# Patient Record
Sex: Female | Born: 1955 | Race: White | Hispanic: No | Marital: Married | Smoking: Never smoker
Health system: Southern US, Community
[De-identification: ages and names within clinical notes are randomized; demographics above are authoritative.]

## PROBLEM LIST (undated history)

## (undated) DIAGNOSIS — R7303 Prediabetes: Secondary | ICD-10-CM

## (undated) DIAGNOSIS — F419 Anxiety disorder, unspecified: Secondary | ICD-10-CM

## (undated) DIAGNOSIS — D649 Anemia, unspecified: Secondary | ICD-10-CM

## (undated) DIAGNOSIS — I10 Essential (primary) hypertension: Secondary | ICD-10-CM

## (undated) DIAGNOSIS — E039 Hypothyroidism, unspecified: Secondary | ICD-10-CM

## (undated) DIAGNOSIS — F32A Depression, unspecified: Secondary | ICD-10-CM

## (undated) DIAGNOSIS — E785 Hyperlipidemia, unspecified: Secondary | ICD-10-CM

## (undated) DIAGNOSIS — N189 Chronic kidney disease, unspecified: Secondary | ICD-10-CM

## (undated) DIAGNOSIS — M1990 Unspecified osteoarthritis, unspecified site: Secondary | ICD-10-CM

## (undated) DIAGNOSIS — J45909 Unspecified asthma, uncomplicated: Secondary | ICD-10-CM

## (undated) DIAGNOSIS — R519 Headache, unspecified: Secondary | ICD-10-CM

## (undated) DIAGNOSIS — K219 Gastro-esophageal reflux disease without esophagitis: Secondary | ICD-10-CM

## (undated) DIAGNOSIS — J342 Deviated nasal septum: Secondary | ICD-10-CM

## (undated) DIAGNOSIS — R51 Headache: Secondary | ICD-10-CM

## (undated) DIAGNOSIS — F329 Major depressive disorder, single episode, unspecified: Secondary | ICD-10-CM

## (undated) DIAGNOSIS — Z8719 Personal history of other diseases of the digestive system: Secondary | ICD-10-CM

## (undated) DIAGNOSIS — G4733 Obstructive sleep apnea (adult) (pediatric): Secondary | ICD-10-CM

## (undated) DIAGNOSIS — E119 Type 2 diabetes mellitus without complications: Secondary | ICD-10-CM

## (undated) DIAGNOSIS — G4730 Sleep apnea, unspecified: Secondary | ICD-10-CM

## (undated) DIAGNOSIS — H269 Unspecified cataract: Secondary | ICD-10-CM

## (undated) HISTORY — DX: Hyperlipidemia, unspecified: E78.5

## (undated) HISTORY — DX: Obstructive sleep apnea (adult) (pediatric): G47.33

## (undated) HISTORY — PX: BREAST SURGERY: SHX581

## (undated) HISTORY — DX: Anemia, unspecified: D64.9

## (undated) HISTORY — DX: Essential (primary) hypertension: I10

## (undated) HISTORY — DX: Deviated nasal septum: J34.2

## (undated) HISTORY — PX: COLONOSCOPY: SHX174

## (undated) HISTORY — DX: Major depressive disorder, single episode, unspecified: F32.9

## (undated) HISTORY — DX: Anxiety disorder, unspecified: F41.9

## (undated) HISTORY — DX: Unspecified asthma, uncomplicated: J45.909

## (undated) HISTORY — DX: Gastro-esophageal reflux disease without esophagitis: K21.9

## (undated) HISTORY — PX: ANKLE GANGLION CYST EXCISION: SHX1148

## (undated) HISTORY — DX: Sleep apnea, unspecified: G47.30

## (undated) HISTORY — PX: UPPER GASTROINTESTINAL ENDOSCOPY: SHX188

## (undated) HISTORY — PX: OTHER SURGICAL HISTORY: SHX169

## (undated) HISTORY — PX: BACK SURGERY: SHX140

## (undated) HISTORY — DX: Depression, unspecified: F32.A

## (undated) HISTORY — DX: Unspecified cataract: H26.9

## (undated) HISTORY — DX: Type 2 diabetes mellitus without complications: E11.9

## (undated) HISTORY — DX: Chronic kidney disease, unspecified: N18.9

---

## 1998-11-27 ENCOUNTER — Encounter: Payer: Self-pay | Admitting: *Deleted

## 1998-11-27 ENCOUNTER — Ambulatory Visit (HOSPITAL_COMMUNITY): Admission: RE | Admit: 1998-11-27 | Discharge: 1998-11-27 | Payer: Self-pay | Admitting: *Deleted

## 1998-12-11 ENCOUNTER — Encounter: Payer: Self-pay | Admitting: *Deleted

## 1998-12-11 ENCOUNTER — Ambulatory Visit (HOSPITAL_COMMUNITY): Admission: RE | Admit: 1998-12-11 | Discharge: 1998-12-11 | Payer: Self-pay | Admitting: *Deleted

## 1998-12-25 ENCOUNTER — Ambulatory Visit (HOSPITAL_COMMUNITY): Admission: RE | Admit: 1998-12-25 | Discharge: 1998-12-25 | Payer: Self-pay | Admitting: *Deleted

## 1998-12-25 ENCOUNTER — Encounter: Payer: Self-pay | Admitting: *Deleted

## 1999-05-25 ENCOUNTER — Ambulatory Visit (HOSPITAL_COMMUNITY): Admission: RE | Admit: 1999-05-25 | Discharge: 1999-05-25 | Payer: Self-pay | Admitting: Gastroenterology

## 2000-01-11 ENCOUNTER — Emergency Department (HOSPITAL_COMMUNITY): Admission: EM | Admit: 2000-01-11 | Discharge: 2000-01-11 | Payer: Self-pay | Admitting: Emergency Medicine

## 2000-08-04 ENCOUNTER — Ambulatory Visit (HOSPITAL_BASED_OUTPATIENT_CLINIC_OR_DEPARTMENT_OTHER): Admission: RE | Admit: 2000-08-04 | Discharge: 2000-08-04 | Payer: Self-pay | Admitting: Plastic Surgery

## 2000-08-04 ENCOUNTER — Encounter (INDEPENDENT_AMBULATORY_CARE_PROVIDER_SITE_OTHER): Payer: Self-pay | Admitting: *Deleted

## 2000-09-11 ENCOUNTER — Ambulatory Visit (HOSPITAL_COMMUNITY): Admission: RE | Admit: 2000-09-11 | Discharge: 2000-09-11 | Payer: Self-pay | Admitting: Internal Medicine

## 2000-09-11 ENCOUNTER — Encounter: Payer: Self-pay | Admitting: Internal Medicine

## 2000-09-17 ENCOUNTER — Ambulatory Visit (HOSPITAL_COMMUNITY): Admission: RE | Admit: 2000-09-17 | Discharge: 2000-09-17 | Payer: Self-pay | Admitting: Urology

## 2000-09-17 ENCOUNTER — Encounter (INDEPENDENT_AMBULATORY_CARE_PROVIDER_SITE_OTHER): Payer: Self-pay

## 2005-07-24 ENCOUNTER — Ambulatory Visit (HOSPITAL_COMMUNITY): Admission: RE | Admit: 2005-07-24 | Discharge: 2005-07-24 | Payer: Self-pay | Admitting: Internal Medicine

## 2005-12-12 ENCOUNTER — Ambulatory Visit (HOSPITAL_COMMUNITY): Admission: RE | Admit: 2005-12-12 | Discharge: 2005-12-12 | Payer: Self-pay | Admitting: Obstetrics and Gynecology

## 2005-12-12 ENCOUNTER — Encounter (INDEPENDENT_AMBULATORY_CARE_PROVIDER_SITE_OTHER): Payer: Self-pay | Admitting: *Deleted

## 2006-03-25 ENCOUNTER — Encounter: Admission: RE | Admit: 2006-03-25 | Discharge: 2006-03-25 | Payer: Self-pay | Admitting: Plastic Surgery

## 2006-04-26 ENCOUNTER — Encounter: Admission: RE | Admit: 2006-04-26 | Discharge: 2006-04-26 | Payer: Self-pay | Admitting: Neurological Surgery

## 2006-05-07 ENCOUNTER — Encounter (INDEPENDENT_AMBULATORY_CARE_PROVIDER_SITE_OTHER): Payer: Self-pay | Admitting: Specialist

## 2006-05-07 ENCOUNTER — Ambulatory Visit (HOSPITAL_BASED_OUTPATIENT_CLINIC_OR_DEPARTMENT_OTHER): Admission: RE | Admit: 2006-05-07 | Discharge: 2006-05-07 | Payer: Self-pay | Admitting: Plastic Surgery

## 2006-05-08 ENCOUNTER — Encounter
Admission: RE | Admit: 2006-05-08 | Discharge: 2006-05-08 | Payer: Self-pay | Admitting: Physical Medicine and Rehabilitation

## 2006-06-13 ENCOUNTER — Ambulatory Visit (HOSPITAL_COMMUNITY): Admission: RE | Admit: 2006-06-13 | Discharge: 2006-06-13 | Payer: Self-pay | Admitting: *Deleted

## 2006-08-26 HISTORY — PX: COLON SURGERY: SHX602

## 2006-08-27 ENCOUNTER — Encounter: Admission: RE | Admit: 2006-08-27 | Discharge: 2006-08-27 | Payer: Self-pay | Admitting: Allergy and Immunology

## 2006-11-20 ENCOUNTER — Encounter: Admission: RE | Admit: 2006-11-20 | Discharge: 2006-11-20 | Payer: Self-pay | Admitting: *Deleted

## 2006-11-25 ENCOUNTER — Other Ambulatory Visit: Admission: RE | Admit: 2006-11-25 | Discharge: 2006-11-25 | Payer: Self-pay | Admitting: Radiology

## 2007-07-14 ENCOUNTER — Inpatient Hospital Stay (HOSPITAL_COMMUNITY): Admission: RE | Admit: 2007-07-14 | Discharge: 2007-07-17 | Payer: Self-pay | Admitting: General Surgery

## 2007-07-14 ENCOUNTER — Encounter (INDEPENDENT_AMBULATORY_CARE_PROVIDER_SITE_OTHER): Payer: Self-pay | Admitting: General Surgery

## 2007-08-06 ENCOUNTER — Encounter (INDEPENDENT_AMBULATORY_CARE_PROVIDER_SITE_OTHER): Payer: Self-pay | Admitting: General Surgery

## 2007-08-06 ENCOUNTER — Ambulatory Visit (HOSPITAL_BASED_OUTPATIENT_CLINIC_OR_DEPARTMENT_OTHER): Admission: RE | Admit: 2007-08-06 | Discharge: 2007-08-06 | Payer: Self-pay | Admitting: General Surgery

## 2008-04-04 ENCOUNTER — Ambulatory Visit (HOSPITAL_COMMUNITY): Admission: RE | Admit: 2008-04-04 | Discharge: 2008-04-04 | Payer: Self-pay | Admitting: Internal Medicine

## 2009-05-23 ENCOUNTER — Encounter: Admission: RE | Admit: 2009-05-23 | Discharge: 2009-05-23 | Payer: Self-pay | Admitting: Gastroenterology

## 2009-05-24 ENCOUNTER — Encounter: Admission: RE | Admit: 2009-05-24 | Discharge: 2009-05-24 | Payer: Self-pay | Admitting: Gastroenterology

## 2010-04-26 ENCOUNTER — Other Ambulatory Visit: Admission: RE | Admit: 2010-04-26 | Discharge: 2010-04-26 | Payer: Self-pay | Admitting: Internal Medicine

## 2010-10-31 ENCOUNTER — Other Ambulatory Visit: Payer: Self-pay | Admitting: Dermatology

## 2011-01-08 NOTE — Op Note (Signed)
NAME:  Kristina Webb, Kristina Webb NO.:  [id number]   MEDICAL RECORD NO.:  [id number]          PATIENT TYPE:  INP   LOCATION:  2899                         FACILITY:  [facility]   PHYSICIAN:  Ollen Gross. Vernell Morgans, M.D. DATE OF BIRTH:  02/09/1956   DATE OF PROCEDURE:  07/14/2007  DATE OF DISCHARGE:                               OPERATIVE REPORT   PREOPERATIVE DIAGNOSIS:  Small bowel tumor.   POSTOPERATIVE DIAGNOSIS:  Small bowel tumor.   PROCEDURE:  Laparoscopic-assisted small-bowel resection.   SURGEON:  Ollen Gross. Vernell Morgans, M.D.   ASSISTANT:  Wilmon Arms. Tsuei, M.D.   ANESTHESIA:  General endotracheal.   PROCEDURE:  After informed consent was obtained, the patient was brought  to the operating placed and placed in the supine position on the  operating room table.  After adequate induction of general anesthesia,  the patient's abdomen was prepped with Betadine and draped in usual  sterile manner.  The area below the umbilicus was infiltrated with 0.25%  Marcaine.   A small incision was made with a 15 blade knife.  This incision was  carried down through the subcutaneous tissue bluntly with a hemostat and  Army-Navy retractors until the linea alba was identified.  The linea  alba was incised with a 15 blade knife, and each side was grasped with  Kocher clamps and elevated anteriorly.  The preperitoneal space was then  probed bluntly with a hemostat until the peritoneum was opened and  access was gained to the abdominal cavity.  A 0 Vicryl pursestring  stitch was placed in the fascia around the opening.  Hasson cannula was  placed through the opening and anchored in place with the previously  placed Vicryl pursestring stitch.  The abdomen was then insufflated with  carbon dioxide without difficulty.  The patient was placed in  Trendelenburg position and rotated with the right side up. The  laparoscope was inserted through the Hasson cannula, and the right lower  quadrant was  inspected.  The rest of the abdomen was also inspected.  No  obvious abnormalities were noted.  Next, on the left side of the  abdominal wall above and below the umbilicus, sites were chosen for  placement of 5 mm ports.  Each of these areas was infiltrated with 0.25%  Marcaine.  Small stab incisions were made with a 15 blade knife and 5-mm  ports were placed bluntly through these incisions into the abdominal  cavity under direct vision.  A Glassman graspers were then used to run  the small bowel.  We started at the cecum and ran backwards through the  terminal ileum several inches proximal to the terminal ileum.  We were  able to identify a tumor in the small bowel which was the tumor that we  could appreciate on her preoperative studies.  The tumor had one  adhesion of an epiploic appendage to it which was divided sharply with  the Harmonic scalpel.  Some of the terminal ileum and proximal cecum  were also mobilized by incising its retroperitoneal attachment with the  Harmonic scalpel.  This  allowed good mobilization of the tumor medially  to the midline of the abdomen.  No other abnormalities of the small  bowel were noted.  At this point, the infraumbilical incision was  extended inferiorly with a 10 blade knife for a short distance.  This  incision was carried down through the skin and subcutaneous tissue  sharply with electrocautery.  The fascia was also opened sharply with  electrocautery.  A wound protector was then deployed.  The small bowel  with the tumor was then able to be grasped with Babcocks and brought up  into the wound.  A site was chosen proximally and distally where the  bowel appeared to be very normal to the side of the tumor, and the  mesentery at each of these points was opened sharply with  electrocautery.  A GIA 55 stapler was placed across the small bowel at  each of these points, clamped and fired, thereby dividing the bowel  between staple lines.  The  mesentery to this segment of small bowel was  taken down with Kelly clamps, and the vessels in the mesentery were  clamped Kelly clamps, divided, and ligated with 2-0 silk ties.  Once  this was accomplished, the specimen was removed  from the patient and  sent to pathology for further evaluation.  The proximal and distal  segment of small bowel easily approximated each other.  They were held  in approximation with 2-0 silk stitch, and the antimesenteric enterotomy  was made near the staple line of each segment of small bowel.  Each limb  of the GIA 55 stapler was then placed down the appropriate limb of small  bowel, clamped and fired, thereby creating a nice widely patent  enteroenterostomy.  This was inspected on the inside and found to be  hemostatic.  The common enterotomy was closed with a TA-60 stapler.  The  staple line was oversewed in several places with 3-0 silk Lembert  stitches, and a single 3-0 silk crotch stitch was also placed.  The  mesentery was closed with two interrupted 2-0 silk stitches.  The  anastomosis appeared to be healthy and widely patent.  It was then  dropped back into the abdomen, and the abdomen was then irrigated with  copious amounts of saline.  The fascia of the midline wound was then  closed with running #1 double-stranded loop PDS suture.  The  subcutaneous tissue was irrigated with copious amounts of saline and  Betadine, and all the skin incisions were closed with staples.  Sterile  dressings were applied.   The patient tolerated the procedure well.  At the end of the case, all  needle, sponge, and instrument counts were correct.  The patient was  awaken and taken to recovery in stable condition.      Ollen Gross. Vernell Morgans, M.D.  Electronically Signed     PST/MEDQ  D:  07/14/2007  T:  07/14/2007  Job:  [id number]

## 2011-01-08 NOTE — Op Note (Signed)
NAME:  Kristina Webb, ROBOTHAM NO.:  [id number]   MEDICAL RECORD NO.:  [id number]          PATIENT TYPE:  AMB   LOCATION:  DSC                          FACILITY:  [facility]   PHYSICIAN:  Ollen Gross. Vernell Morgans, M.D. DATE OF BIRTH:  1955/10/15   DATE OF PROCEDURE:  08/06/2007  DATE OF DISCHARGE:                               OPERATIVE REPORT   PREOPERATIVE DIAGNOSIS:  Right breast papilloma.   POSTOPERATIVE DIAGNOSIS:  Right breast papilloma.   PROCEDURE:  Right breast needle-localized lumpectomy.   SURGEON:  Ollen Gross. Vernell Morgans, M.D.   ANESTHESIA:  General via LMA.   PROCEDURE:  After informed consent was obtained, the patient was brought  to the operating placed in supine position on the table.  After  induction of general anesthesia, the patient's of right breast was  prepped with Betadine and draped in usual sterile manner.  Earlier in  the day the patient had undergone a wire localization procedure and the  wire was entering the right breast in the upper-outer quadrant working  medially.  A curvilinear incision was made on the upper portion of the  right breast overlying the path of the wire.  This incision was carried  down through the skin and subcutaneous tissue sharply with  electrocautery.  The path of wire could be palpated.  A circular portion  of breast tissue was excised around the path of the wire.  This was done  sharply with electrocautery.  Once this specimen been completely removed  from the patient and was then marked with a short stitch superior and  long stitch lateral and sent for specimen radiograph which appeared to  have the papilloma in it as well as to pathology.  Hemostasis was  achieved using Bovie electrocautery and the wound was then infiltrated  0.25% Marcaine.  The deep layer of the incision was closed with  interrupted 3-0 Vicryl stitches and the skin was closed with a running  for Monocryl subcuticular stitch.  Dermabond dressing was applied.   The  patient tolerated procedure well.  At the end of the case, all needle,  sponge, instrument counts correct.  The patient was awakened and taken  recovery in stable condition.      Ollen Gross. Vernell Morgans, M.D.  Electronically Signed     PST/MEDQ  D:  08/06/2007  T:  08/07/2007  Job:  [id number]

## 2011-01-11 NOTE — Discharge Summary (Signed)
NAME:  JOUA, BAKE NO.:  [id number]   MEDICAL RECORD NO.:  [id number]          PATIENT TYPE:  INP   LOCATION:  5740                         FACILITY:  [facility]   PHYSICIAN:  Ollen Gross. Vernell Morgans, M.D. DATE OF BIRTH:  1956-07-30   DATE OF ADMISSION:  07/14/2007  DATE OF DISCHARGE:  07/17/2007                               DISCHARGE SUMMARY   Ms. Hulet is a 55 year old female who was noted to have a small bowel  tumor.  She was brought to the operating room and underwent laparoscopic  assisted resection of this tumor.  Postoperatively, she did well.  Her  diet was slowly advanced.  Her PCA pump for pain control was stopped on  postoperative day #2, as well as her Foley, and by postoperative day #3,  she was doing well and ready for discharge home.   MEDICATION:  She was given a prescription for pain medicine.   ACTIVITY:  No heavy lifting.   DIET:  As tolerated.   FOLLOWUP:  With Dr. Carolynne Edouard in weeks.   FINAL DIAGNOSIS:  Endometriosis of the small bowel.  She is discharged  home.      Ollen Gross. Vernell Morgans, M.D.  Electronically Signed     PST/MEDQ  D:  08/31/2007  T:  08/31/2007  Job:  [id number]

## 2011-01-11 NOTE — Op Note (Signed)
[facility]  Patient:    Kristina Webb, Kristina Webb                       MRN: [id number] Proc. Date: 09/17/00 Adm. Date:  [id number] Disc. Date: [id number] Attending:  Loura Halt Ii                           Operative Report  PREOPERATIVE DIAGNOSES:  Bladder neck polyps and microhematuria.  POSTOPERATIVE DIAGNOSES:  Bladder neck polyps, microhematuria, and urethral stenosis.  PROCEDURE:  Cystoscopy and urethral dilation, cold cut bladder biopsy of bladder neck polyps and fulguration of bladder neck and bladder neck polyps.  SURGEON:  Dr. Larey Dresser.  ANESTHESIA:  General.  INDICATIONS FOR PROCEDURE:  This 55 year old lady has had workup for microhematuria and the upper tracts were unremarkable on renal ultrasound. Cystoscopy revealed a lot of good sized inflammatory polyps around the bladder neck at almost 360 degrees. She was treated with Septra DS for three weeks and cystoscopy two months later revealed no change in these bladder neck polyps. She is brought to the OR today for biopsy of these lesions and fulguration.  DESCRIPTION OF PROCEDURE:  The patient is brought to the operating room and placed in lithotomy position. The cystoscope seemed snug so the urethra was dilated and then the 25 French cystoscope inserted. The bladder itself was unremarkable but the bladder neck had circumferential polyps all the way around but hopefully they are just inflammatory. The rest of the urethra was unremarkable. Cold cut bladder biopsy forceps was used to obtain portions of these polyps for pathology. I then used the Bugbee electrode to fulgurate the biopsy sites and any small residual polyps that remained. At this point, there was negligible blood loss and good hemostasis. The bladder was emptied, the scope removed and the patient sent to the recovery room in good condition. DD:  09/17/00 TD:  09/17/00 Job: [id number]

## 2011-01-11 NOTE — H&P (Signed)
NAME:  Kristina Webb, STROUGH NO.:  [id number]   MEDICAL RECORD NO.:  [id number]           PATIENT TYPE:  AMB   LOCATION:                                [facility]  [facility]   PHYSICIAN:  Zenaida Niece, M.D.DATE OF BIRTH:  August 04, 1956   DATE OF ADMISSION:  12/12/2005  DATE OF DISCHARGE:                                HISTORY & PHYSICAL   CHIEF COMPLAINT:  Abnormal uterine bleeding and probable endometrial polyp.   HISTORY OF PRESENT ILLNESS:  This is a 55 year old white female, gravida 0,  para 0, whom I saw first in March of this year.  She has been on oral  contraceptives with menses every month with occasional spotting in between.  She did recently have irregular bleeding, but also missed a couple of her  pills.  However, since then she has been bleeding fairly consistently,  initially heavy and then lighter.  She is on the birth control pill because  she has heavy periods when she is off the pill.  She is sexually active  without problems.   On physical exam her uterus was normal size.  On April 4 she had a pelvic  ultrasound which revealed a normal sized uterus and a small left ovary.  However, with saline infusion there was a probable endometrial polyp and  possibly a partially submucosal myoma.  Endometrial biopsy was also  performed which revealed fragments consistent with an endometrial polyp.  She is being admitted, at this time, for hysteroscopy with removal of the  polyp as well as endometrial ablation.   PAST MEDICAL HISTORY:  1.  Asthma.  2.  Gastroesophageal reflux disease.  3.  Hypertension.  4.  Anxiety.  5.  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  6.  She has no history of venous thromboembolism.   SURGICAL HISTORY:  Back surgery and surgery for urethral polyps.   ALLERGIES:  None known.   CURRENT MEDICATIONS:  Paxil, Asmanex, Proventil, Singulair, omeprazole,  Diovan, and __________ birth control pills.   GYNECOLOGICAL HISTORY:  No history of abnormal  Pap smears with a normal Pap  smear approximately 1 year ago and no history of sexually transmitted  diseases.   FAMILY HISTORY:  Her mother had uterine and breast cancer.   SOCIAL HISTORY:  The patient is married and denies alcohol, tobacco, or drug  use.   REVIEW OF SYSTEMS:  She does have occasional dysuria.  Normal bowel  movements.  Review of systems otherwise negative.   PHYSICAL EXAMINATION:  VITAL SIGNS:  Weight is 219 pounds, pulse is 70,  blood pressure 122/80.  GENERAL:  This is a well-developed, white female in no acute distress. She  does have a port wine stain on the right side of her face.  NECK:  Supple without lymphadenopathy or thyromegaly.  LUNGS:  Clear to auscultation.  HEART:  Regular rate and rhythm without murmur.  ABDOMEN:  Soft, nontender, nondistended, without palpable masses.  EXTREMITIES:  Have no edema and are nontender.  PELVIC EXAM:  External genitalia have no lesions. On speculum exam the  cervix is normal.  On bimanual exam she has a small midplane, nontender  uterus and no adnexal masses.   ASSESSMENT:  Irregular bleeding and a probable endometrial polyp and  possible submucosal myoma.  The patient wishes to undergo conservative  surgical therapy for this.  The risks of surgery have been discussed with  the patient.   PLAN:  The plan is to admit the patient on the day of surgery for  hysteroscopy with removal of this polyp and possible submucosal fibroid  followed by endometrial ablation with NovaSure.      Zenaida Niece, M.D.  Electronically Signed     TDM/MEDQ  D:  12/11/2005  T:  12/11/2005  Job:  [id number]

## 2011-01-11 NOTE — Op Note (Signed)
NAME:  Kristina Webb, Kristina Webb NO.:  [id number]   MEDICAL RECORD NO.:  [id number]          PATIENT TYPE:  AMB   LOCATION:  SDC                           FACILITY:  [facility]   PHYSICIAN:  Zenaida Niece, M.D.DATE OF BIRTH:  10-Jun-1956   DATE OF PROCEDURE:  12/12/2005  DATE OF DISCHARGE:                                 OPERATIVE REPORT   PREOPERATIVE DIAGNOSIS:  Abnormal uterine bleeding and endometrial polyp.   POSTOPERATIVE DIAGNOSIS:  Abnormal uterine bleeding and endometrial polyp.   PROCEDURE:  Hysteroscopy with resection of endometrial polyp and endometrial  ablation with NovaSure.   SURGEON:  Zenaida Niece, M.D.   ASSISTANTS:  None.   ANESTHESIA:  General with an LMA and paracervical block.   SPECIMENS:  Endometrial polyp.   ESTIMATED BLOOD LOSS:  Minimal.   COMPLICATIONS:  None.   FINDINGS:  She had minimal fluid deficit through the hysteroscope. There was  a moderate-sized endometrial polyp well visualized. The NovaSure device used  a uterine depth of 5 cm uterine width of 4.4 cm and used 121 watts for about  50 seconds.   PROCEDURE IN DETAIL:  The patient was taken to the operating room and placed  in the dorsal supine position. She was then placed in mobile stirrups while  awake to make sure her back was comfortable. General anesthesia was induced  with an LMA. Perineum and vagina were then prepped and draped in the usual  sterile fashion and bladder drained with a red rubber catheter. A Graves  speculum was inserted into the vagina and the anterior lip of the cervix was  grasped with a single-tooth tenaculum. Paracervical block was then performed  with 16 mL of 2% plain lidocaine. Uterus then sounded to 9 cm. The cervix  was gradually dilated to a size 29 dilator and along the way, the cervix  measured 4 cm with a size 7 dilator. The resectoscope was then inserted with  slight difficulty just due to difficulty getting through the cervix. Once I  was in the endometrial cavity, good visualization was achieved. There was  one polyp coming from the right inferior uterine fundus. The remainder of  the endometrial cavity appeared atrophic and normal. The polyp was resected  with a single loop through the resectoscope and then taken out with scope.  The endometrial cavity was then flushed with lactated Ringer's. All fluid  and the hysteroscope was removed. The NovaSure device was then prepared  appropriately, inserted into the uterus and deployed. It passed the CO2 test  and then performed endometrial ablation as mentioned above without  complications. When the device was removed, it appeared to be intact. The  single-tooth tenaculum was removed and bleeding was controlled with pressure  and  with silver nitrate. All instruments were then removed from the vagina. The  patient tolerated the procedure well and was taken to the recovery in stable  condition. Counts were correct x2, she received no antibiotics and did  receive Toradol prior to the endometrial ablation.      Zenaida Niece, M.D.  Electronically Signed  TDM/MEDQ  D:  12/12/2005  T:  12/13/2005  Job:  [id number]

## 2011-01-11 NOTE — Op Note (Signed)
NAME:  Kristina Webb, Kristina Webb NO.:  [id number]   MEDICAL RECORD NO.:  [id number]          PATIENT TYPE:  AMB   LOCATION:  DSC                          FACILITY:  [facility]   PHYSICIAN:  Alfredia Ferguson, M.D.  DATE OF BIRTH:  February 01, 1956   DATE OF PROCEDURE:  05/07/2006  DATE OF DISCHARGE:                                 OPERATIVE REPORT   PREOPERATIVE DIAGNOSIS:  Multiple port wine stain nodules in the V2  distribution, right face.   POSTOPERATIVE DIAGNOSIS:  Multiple port wine stain nodules in the V2  distribution, right face.   OPERATION PERFORMED:  Excision of seven of vascular nodules, right face.   SURGEON:  Dr. Benna Dunks.   ANESTHESIA:  Xylocaine 2% with 1:1000 epinephrine.   INDICATIONS FOR SURGERY:  This this lady has a port wine stain in the V2  distribution of her face.  She has begun to develop raised nodules, each  approximately 6-7 mm in diameter.  She has numerous nodules and she would  like to have some of the larger ones removed.  She understands the risk of  scarring and also the risk of bleeding after removal.  In spite of that, she  wishes to proceed.  Skin marks were placed around seven nodules in her right  face.  These nodules were located in the right forehead, right upper eyelid,  right malar area.  There were four located in the medial aspect of the right  cheek, one in the medial right lower eyelid, another in the right paranasal  area, and two located in the right nasolabial fold area.  Finally the  seventh and last lesion was one in the right upper lip.  Each lesion was  elliptically marked, and anesthetized, and prepped and draped.  After  waiting approximately 10 minutes, elliptical excision of each of the seven  lesions was carried out down to the level of subcutaneous tissue.  After  achieving hemostasis with pressure and topical local anesthetic, each of the  seven areas of excision were closed with interrupted 5-0 Monocryl for the  dermis,  followed by running 6-0 nylon suture.  The patient tolerated the  procedure well.  The area was cleansed and dried, and light dressings were  applied.  The patient was discharged home in the care of her husband.      Alfredia Ferguson, M.D.  Electronically Signed     WBB/MEDQ  D:  05/07/2006  T:  05/08/2006  Job:  [id number]

## 2011-01-11 NOTE — Op Note (Signed)
[facility]  Patient:    Kristina Webb, Kristina Webb                       MRN: [id number] Proc. Date: 08/06/00 Adm. Date:  [id number] Disc. Date: [id number] Attending:  Loura Halt Ii                           Operative Report  PREOPERATIVE DIAGNOSIS:  Port wine stain in V1 and V2 distribution with multiple vascular nodules.  POSTOPERATIVE DIAGNOSIS:  Port wine stain in V1 and V2 distribution with multiple vascular nodules.  OPERATION:  Excision of the following vascular nodules: 1. A 6 mm glabellar nodule. 2. A 7 mm right lateral eyebrow nodule. 3. A 5 mm right lateral lower eyelid nodule. 4. A 5 mm right lower medial eyelid nodule. 5. Excision 1 cm right malar vascular nodule.  SURGEON:  Alfredia Ferguson, M.D.  ANESTHESIA:  2% Xylocaine and 1:100,000 epinephrine.  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  She has over the years developed many nodules.  Some of these now have gotten quite large and bleed very easily.  She wishes to have the ones that have become bothersome removed.  She understands she will be trading what she has for permanent potentially unsightly scar.  In spite of that, she wishes to proceed with the surgery.  DESCRIPTION OF PROCEDURE:  Skin marks were placed around the above-described nodules, and local anesthesia was infiltrated in each area.  The patients forehead and right face were prepped and draped in a sterile fashion.  After waiting approximately 10 minutes, attention was first directed to the glabellar region.  An elliptical excision of the glabellar lesion was carried out, and it was passed off for pathology.  Hemostasis was accomplished using pressure.  Wound edges were undermined for a distance of 2-3 mm.  The wound was closed by uniting the wound edges with 6-0 running nylon.  The right lateral eyebrow  lesion was now excised also in an elliptical fashion.  The lesion was passed off for pathology.  The wound edges were undermined for a distance of 3-4 mm.  The wound was closed using a running 6-0 nylon suture. The right lateral lower eyelid and right medial lower eyelid nodule were excised in elliptical fashion and passed off to pathology.  The wound edges were undermined for a distance of 2-3 mm.  Hemostasis was accomplished using pressure.  Each wound was then closed using interrupted 6-0 nylon suture. Finally, the largest of all the lesions was excised from the right malar area. This was actually a coalescence of three nodules which had grown together. The lesions were excised down to the level of subcutaneous tissue and were passed off for pathology.  Wound edges were undermined for a distance of 3-4 mm in all direction.  Hemostasis was accomplished using electrocautery. Wound was closed by uniting the dermis using interrupted 5-0 Vicryl suture. The skin was united using a running 6-0 nylon suture.  The patient tolerated the procedure well.  Light dressings were applied to all the lesions, and the patient was discharged to home in satisfactory condition. DD:  08/06/00 TD:  08/06/00 Job: [id number]

## 2011-05-15 ENCOUNTER — Ambulatory Visit (HOSPITAL_COMMUNITY)
Admission: RE | Admit: 2011-05-15 | Discharge: 2011-05-15 | Disposition: A | Discharge status: Home / Self Care | Payer: 59 | Source: Ambulatory Visit | Attending: Internal Medicine | Admitting: Internal Medicine

## 2011-05-15 ENCOUNTER — Other Ambulatory Visit (HOSPITAL_COMMUNITY): Payer: Self-pay | Admitting: *Deleted

## 2011-05-15 DIAGNOSIS — R0609 Other forms of dyspnea: Secondary | ICD-10-CM

## 2011-05-15 DIAGNOSIS — I10 Essential (primary) hypertension: Secondary | ICD-10-CM | POA: Insufficient documentation

## 2011-05-15 DIAGNOSIS — R0989 Other specified symptoms and signs involving the circulatory and respiratory systems: Secondary | ICD-10-CM

## 2011-05-15 DIAGNOSIS — R0602 Shortness of breath: Secondary | ICD-10-CM | POA: Insufficient documentation

## 2011-06-03 LAB — POCT HEMOGLOBIN-HEMACUE
Hemoglobin: 11.4 — ABNORMAL LOW
Operator id: 208731

## 2011-06-03 LAB — BASIC METABOLIC PANEL
BUN: 10
CO2: 27
Calcium: 8.5
Chloride: 102
Creatinine, Ser: 1.08
GFR calc Af Amer: >60
GFR calc non Af Amer: 54 — ABNORMAL LOW
Glucose, Bld: 131 — ABNORMAL HIGH
Potassium: 3.4 — ABNORMAL LOW
Sodium: 137

## 2011-06-04 LAB — BASIC METABOLIC PANEL
BUN: 6
CO2: 28
Calcium: 8.2 — ABNORMAL LOW
Chloride: 104
Creatinine, Ser: 0.94
GFR calc Af Amer: >60
GFR calc non Af Amer: >60
Glucose, Bld: 112 — ABNORMAL HIGH
Potassium: 4
Sodium: 140

## 2011-06-04 LAB — CBC
HCT: 32.4 — ABNORMAL LOW
HCT: 36.3
Hemoglobin: 10.8 — ABNORMAL LOW
Hemoglobin: 12.1
MCHC: 33.3
MCHC: 33.3
MCV: 81.9
MCV: 80.6
Platelets: 304
Platelets: 331
RBC: 3.95
RBC: 4.51
RDW: 15.3
RDW: 15.3
WBC: 12.2 — ABNORMAL HIGH
WBC: 13.3 — ABNORMAL HIGH

## 2011-06-04 LAB — COMPREHENSIVE METABOLIC PANEL WITH GFR
ALT: 26
AST: 22
Albumin: 3.5
Alkaline Phosphatase: 70
BUN: 15
CO2: 29
Calcium: 9.1
Chloride: 99
Creatinine, Ser: 0.96
GFR calc non Af Amer: >60
Glucose, Bld: 125 — ABNORMAL HIGH
Potassium: 3.7
Sodium: 135
Total Bilirubin: 0.4
Total Protein: 7.2

## 2011-06-04 LAB — DIFFERENTIAL
Basophils Absolute: 0
Basophils Absolute: 0
Basophils Relative: 0
Basophils Relative: 0
Eosinophils Absolute: 0.1 — ABNORMAL LOW
Eosinophils Absolute: 0.2
Eosinophils Relative: 1
Eosinophils Relative: 2
Lymphocytes Relative: 23
Lymphocytes Relative: 20
Lymphs Abs: 2.8
Lymphs Abs: 2.6
Monocytes Absolute: 0.8
Monocytes Absolute: 0.6
Monocytes Relative: 6
Monocytes Relative: 5
Neutro Abs: 8.5 — ABNORMAL HIGH
Neutro Abs: 9.9 — ABNORMAL HIGH
Neutrophils Relative %: 69
Neutrophils Relative %: 74

## 2012-09-04 ENCOUNTER — Other Ambulatory Visit: Payer: Self-pay | Admitting: Otolaryngology

## 2012-09-04 ENCOUNTER — Ambulatory Visit
Admission: RE | Admit: 2012-09-04 | Discharge: 2012-09-04 | Disposition: A | Discharge status: Home / Self Care | Payer: 59 | Source: Ambulatory Visit | Attending: Otolaryngology | Admitting: Otolaryngology

## 2012-09-04 DIAGNOSIS — R51 Headache: Secondary | ICD-10-CM

## 2012-09-07 ENCOUNTER — Other Ambulatory Visit: Payer: Self-pay | Admitting: Otolaryngology

## 2012-11-27 ENCOUNTER — Other Ambulatory Visit: Payer: Self-pay

## 2012-12-08 ENCOUNTER — Other Ambulatory Visit (HOSPITAL_COMMUNITY): Payer: Self-pay | Admitting: Internal Medicine

## 2012-12-08 ENCOUNTER — Ambulatory Visit (HOSPITAL_COMMUNITY)
Admission: RE | Admit: 2012-12-08 | Discharge: 2012-12-08 | Disposition: A | Discharge status: Home / Self Care | Payer: 59 | Source: Ambulatory Visit | Attending: Internal Medicine | Admitting: Internal Medicine

## 2012-12-08 DIAGNOSIS — IMO0002 Reserved for concepts with insufficient information to code with codable children: Secondary | ICD-10-CM | POA: Insufficient documentation

## 2012-12-08 DIAGNOSIS — R05 Cough: Secondary | ICD-10-CM

## 2012-12-08 DIAGNOSIS — I10 Essential (primary) hypertension: Secondary | ICD-10-CM | POA: Insufficient documentation

## 2012-12-08 DIAGNOSIS — R062 Wheezing: Secondary | ICD-10-CM | POA: Insufficient documentation

## 2012-12-08 DIAGNOSIS — R059 Cough, unspecified: Secondary | ICD-10-CM

## 2012-12-25 ENCOUNTER — Institutional Professional Consult (permissible substitution): Payer: 59 | Admitting: Internal Medicine

## 2012-12-28 ENCOUNTER — Ambulatory Visit (INDEPENDENT_AMBULATORY_CARE_PROVIDER_SITE_OTHER): Payer: 59 | Admitting: Internal Medicine

## 2012-12-28 ENCOUNTER — Encounter: Payer: Self-pay | Admitting: Internal Medicine

## 2012-12-28 VITALS — BP 118/72 | HR 80 | Temp 97.7°F | Ht 63.5 in | Wt 215.0 lb

## 2012-12-28 DIAGNOSIS — R059 Cough, unspecified: Secondary | ICD-10-CM

## 2012-12-28 DIAGNOSIS — R05 Cough: Secondary | ICD-10-CM | POA: Insufficient documentation

## 2012-12-28 MED ORDER — HYDROCODONE-ACETAMINOPHEN 5-325 MG PO TABS: 1.0000 | ORAL_TABLET | ORAL | Status: DC | PRN

## 2012-12-28 MED ORDER — PREDNISONE (PAK) 10 MG PO TABS: ORAL_TABLET | ORAL | Status: DC

## 2012-12-28 NOTE — Progress Notes (Signed)
  Subjective:    Patient ID: Kristina Webb, female    DOB: [birth year] MRN: [id number]  HPI  7 yowf  Never smoker and starting in her 42's developed recurrent cough dx with asthma by [facility] allergic to dust and feathers rx asthmanex but not consistent with it referred 12/28/2012 to Pulmonary clinic by [facility] office for refractory cough.  12/28/2012 1st pulmonary eval cc chronic cough x years much worse x 4 weeks, abrupt onset with green mucus rx zpak and pred and benzoate and hydrocodon some better. Has not used asthmanex or albuterol during this flare. Also urinary incont with cough.  Was worse at hs now more of a daytime problem and mostly dry/ throat clearing with sensation of pnds but not excess mucus s assoc sob but does have gen ant chest discomfort x years with severe cough.  No obvious daytime variabilty or assoc chronic cough or cp or chest tightness, subjective wheeze overt sinus or hb symptoms. No unusual exp hx or h/o childhood pna/ asthma or premature birth to her knowledge.   Sleeping ok without nocturnal  or early am exacerbation  of respiratory  c/o's or need for noct saba. Also denies any obvious fluctuation of symptoms with weather or environmental changes or other aggravating or alleviating factors except as outlined above      Review of Systems  Constitutional: Negative for fever, chills and unexpected weight change.  HENT: Negative for ear pain, nosebleeds, congestion, sore throat, rhinorrhea, sneezing, trouble swallowing, dental problem, voice change, postnasal drip and sinus pressure.   Eyes: Negative for visual disturbance.  Respiratory: Positive for cough. Negative for choking and shortness of breath.   Cardiovascular: Positive for chest pain. Negative for leg swelling.  Gastrointestinal: Negative for vomiting, abdominal pain and diarrhea.  Genitourinary: Negative for difficulty urinating.  Musculoskeletal: Negative for arthralgias.  Skin: Negative for rash.   Neurological: Negative for tremors, syncope and headaches.  Hematological: Does not bruise/bleed easily.       Objective:   Physical Exam  amb wf nad Wt Readings from Last 3 Encounters:  12/28/12 215 lb (97.523 kg)   HEENT: nl dentition, turbinates, and orophanx. Nl external ear canals without cough reflex   NECK :  without JVD/Nodes/TM/ nl carotid upstrokes bilaterally   LUNGS: no acc muscle use, clear to A and P bilaterally without cough on insp or exp maneuvers   CV:  RRR  no s3 or murmur or increase in P2, no edema   ABD:  soft and nontender with nl excursion in the supine position. No bruits or organomegaly, bowel sounds nl  MS:  warm without deformities, calf tenderness, cyanosis or clubbing  SKIN: warm and dry without lesions    NEURO:  alert, approp, no deficits    cxr 12/08/12 Stable exam. No active cardiopulmonary disease.       Assessment & Plan:

## 2012-12-28 NOTE — Patient Instructions (Addendum)
The key to effective treatment for your cough is eliminating the non-stop cycle of cough you're stuck in long enough to let your airway heal completely and then see if there is anything still making you cough once you stop the cough suppression, but this should take no more than 5 days to figuure out  First take delsym two tsp every 12 hours and supplement if needed with hydrocodon every 4 hours to suppress the urge to cough. Swallowing water or using ice chips/non mint and menthol containing candies (such as lifesavers or sugarless jolly ranchers) are also effective.  You should rest your voice and avoid activities that you know make you cough.  Once you have eliminated the cough for 3 straight days hydrocodon  then the delsym as tolerated.    Try protonix  Take 30-60 min before first meal of the day and Pepcid 20 mg one bedtime until cough is completely gone for at least a week without the need for cough suppression  Zyrtec 10 mg one at bedtime    GERD (REFLUX)  is an extremely common cause of respiratory symptoms, many times with no significant heartburn at all.    It can be treated with medication, but also with lifestyle changes including avoidance of late meals, excessive alcohol, smoking cessation, and avoid fatty foods, chocolate, peppermint, colas, red wine, and acidic juices such as orange juice.  NO MINT OR MENTHOL PRODUCTS SO NO COUGH DROPS  USE SUGARLESS CANDY INSTEAD (jolley ranchers or Stover's)  NO OIL BASED VITAMINS - use powdered substitutes.   Prednisone 10 mg take  4 each am x 2 days,   2 each am x 2 days,  1 each am x2days and stop    Please schedule a follow up office visit in 4 weeks, sooner if needed

## 2012-12-29 NOTE — Assessment & Plan Note (Addendum)
The most common causes of chronic cough in immunocompetent adults include the following: upper airway cough syndrome (UACS), previously referred to as postnasal drip syndrome (PNDS), which is caused by variety of rhinosinus conditions; (2) asthma; (3) GERD; (4) chronic bronchitis from cigarette smoking or other inhaled environmental irritants; (5) nonasthmatic eosinophilic bronchitis; and (6) bronchiectasis.   These conditions, singly or in combination, have accounted for up to 94% of the causes of chronic cough in prospective studies.   Other conditions have constituted no >6% of the causes in prospective studies These have included bronchogenic carcinoma, chronic interstitial pneumonia, sarcoidosis, left ventricular failure, ACEI-induced cough, and aspiration from a condition associated with pharyngeal dysfunction.    Chronic cough is often simultaneously caused by more than one condition. A single cause has been found from 38 to 82% of the time, multiple causes from 18 to 62%. Multiply caused cough has been the result of three diseases up to 42% of the time.       Of the three most common causes of chronic cough, only one (GERD)  can actually cause the other two (asthma and post nasal drip syndrome)  and perpetuate the cylce of cough inducing airway trauma, inflammation, heightened sensitivity to reflux which is prompted by the cough itself via a cyclical mechanism.    This may partially respond to steroids and look like asthma and post nasal drainage but never erradicated completely unless the cough and the secondary reflux are eliminated, preferably both at the same time.  While not intuitively obvious, many patients with chronic low grade reflux do not cough until there is a secondary insult that disturbs the protective epithelial barrier and exposes sensitive nerve endings.  This can be viral or direct physical injury such as with an endotracheal tube.   The point is that once this occurs, it is  difficult to eliminate using anything but a maximally effective acid suppression regimen at least in the short run, accompanied by an appropriate diet to address non acid GERD.   For now continue max rx for gerd acid and nonacid and add zyrtec but avoid inhalers for now as these symptoms are not typical of asthma and she hasn't found rx for asthma helpful in past  See instructions for specific recommendations which were reviewed directly with the patient who was given a copy with highlighter outlining the key components.

## 2013-01-28 ENCOUNTER — Ambulatory Visit (INDEPENDENT_AMBULATORY_CARE_PROVIDER_SITE_OTHER): Payer: 59 | Admitting: Internal Medicine

## 2013-01-28 ENCOUNTER — Encounter: Payer: Self-pay | Admitting: Internal Medicine

## 2013-01-28 VITALS — BP 122/76 | HR 84 | Temp 98.3°F | Ht 63.5 in | Wt 218.4 lb

## 2013-01-28 DIAGNOSIS — R059 Cough, unspecified: Secondary | ICD-10-CM

## 2013-01-28 DIAGNOSIS — R05 Cough: Secondary | ICD-10-CM

## 2013-01-28 MED ORDER — PREDNISONE (PAK) 10 MG PO TABS: ORAL_TABLET | ORAL | Status: DC

## 2013-01-28 NOTE — Patient Instructions (Addendum)
Symbicort 80 Take 2 puffs first thing in am and then another 2 puffs about 12 hours later.   Stop cozar (losartan) and start micardis 80 mg one half daily     Change zyrtec to chlortrimeton 4 mg at bedtime and during the day as needed for drainage  Prednisone 10 mg take  4 each am x 2 days,   2 each am x 2 days,  1 each am x2days and stop   Stay on Protonix before bfast and pepcid 20 mg at bedtime  Take delsym two tsp every 12 hours and supplement if needed with  vicodin up to  every 4 hours to suppress the urge to cough. Swallowing water or using ice chips/non mint and menthol containing candies (such as lifesavers or sugarless jolly ranchers) are also effective.  You should rest your voice and avoid activities that you know make you cough.  Once you have eliminated the cough for 3 straight days try reducing the vicodin first,  then the delsym as tolerated.    Please schedule a follow up office visit in 2 weeks, sooner if needed

## 2013-01-28 NOTE — Progress Notes (Signed)
Subjective:    Patient ID: Kristina Webb, female    DOB: 28-Apr-1956 MRN: [id number]   Brief patient profile:  39 yowf  Never smoker and starting in her 30's developed recurrent cough dx with asthma by Kristina Webb allergic to dust and feathers rx asthmanex but not consistent with it referred 12/28/2012 to Pulmonary clinic by Kristina Webb for refractory cough.  12/28/2012 1st pulmonary eval cc chronic cough x years much worse x 4 weeks, abrupt onset with green mucus rx zpak and pred and benzoate and hydrocodon some better. Has not used asthmanex or albuterol during this flare. Also urinary incont with cough.  Was worse at hs now more of a daytime problem and mostly dry/ throat clearing with sensation of pnds but not excess mucus s assoc sob but does have gen ant chest discomfort x years with severe cough. rec  First take delsym two tsp every 12 hours and supplement if needed with hydrocodon every 4 hours to suppress the urge to cough.  . Once you have eliminated the cough for 3 straight days hydrocodon  then the delsym as tolerated.   Try protonix  Take 30-60 min before first meal of the day and Pepcid 20 mg one bedtime until cough is completely gone for at least a week without the need for cough suppression Zyrtec 10 mg one at bedtime  GERD diet  Prednisone 10 mg take  4 each am x 2 days,   2 each am x 2 days,  1 each am x 2 days and stop    01/28/2013 f/u ov/Kristina Webb maint on ppi and pepcid and zyrtec at hs  Chief Complaint  Patient presents with  . Follow-up    Cough had improved some on prednisone, but then developed a cold approx 1 wk ago and cough is back where it was last visit.  cough is worse during the day, dry  No obvious daytime variabilty or assoc chronic cough or cp or chest tightness, subjective wheeze overt sinus or hb symptoms. No unusual exp hx or h/o childhood pna/ asthma or premature birth to her knowledge.   Sleeping ok without nocturnal  or early am exacerbation  of respiratory   c/o's or need for noct saba. Also denies any obvious fluctuation of symptoms with weather or environmental changes or other aggravating or alleviating factors except as outlined above   Current Medications, Allergies, Past Medical History, Past Surgical History, Family History, and Social History were reviewed in Kristina Webb.  ROS  The following are not active complaints unless bolded sore throat, dysphagia, dental problems, itching, sneezing,  nasal congestion or excess/ purulent secretions, ear ache,   fever, chills, sweats, unintended wt loss, pleuritic or exertional cp, hemoptysis,  orthopnea pnd or leg swelling, presyncope, palpitations, heartburn, abdominal pain, anorexia, nausea, vomiting, diarrhea  or change in bowel or urinary habits, change in stools or urine, dysuria,hematuria,  rash, arthralgias, visual complaints, headache, numbness weakness or ataxia or problems with walking or coordination,  change in mood/affect or memory.               Objective:   Physical Exam  amb wf nad  Wt Readings from Last 3 Encounters:  01/28/13 218 lb 6.4 oz (99.066 kg)  12/28/12 215 lb (97.523 kg)     HEENT: nl dentition, turbinates, and orophanx. Nl external ear canals without cough reflex   NECK :  without JVD/Nodes/TM/ nl carotid upstrokes bilaterally   LUNGS: no acc muscle use, clear  to A and P bilaterally without cough on insp or exp maneuvers   CV:  RRR  no s3 or murmur or increase in P2, no edema   ABD:  soft and nontender with nl excursion in the supine position. No bruits or organomegaly, bowel sounds nl  MS:  warm without deformities, calf tenderness, cyanosis or clubbing       cxr 12/08/12 Stable exam. No active cardiopulmonary disease.       Assessment & Plan:

## 2013-01-29 NOTE — Assessment & Plan Note (Signed)
Lack of cough resolution could mean an alternative diagnosis (she really does have ashtma), persistence of the disease state(like bronchiectasis , or inadequacy of currently available therapy (eg no rx available for non-acid gerd as it appears acid suppresion was probably adequate.  Will rechallenge her with symbicort 80 2bid and add h1 at hs per guidelines to completely eliminite pnds from ddx while aggressively treating all forms fo gerd / cyclical cough  See instructions for specific recommendations which were reviewed directly with the patient who was given a copy with highlighter outlining the key components.

## 2013-02-09 ENCOUNTER — Encounter: Payer: Self-pay | Admitting: Internal Medicine

## 2013-02-09 ENCOUNTER — Ambulatory Visit (INDEPENDENT_AMBULATORY_CARE_PROVIDER_SITE_OTHER): Payer: 59 | Admitting: Internal Medicine

## 2013-02-09 VITALS — BP 108/78 | HR 75 | Temp 98.7°F | Ht 63.5 in | Wt 218.4 lb

## 2013-02-09 DIAGNOSIS — I10 Essential (primary) hypertension: Secondary | ICD-10-CM

## 2013-02-09 DIAGNOSIS — R05 Cough: Secondary | ICD-10-CM

## 2013-02-09 DIAGNOSIS — R059 Cough, unspecified: Secondary | ICD-10-CM

## 2013-02-09 MED ORDER — BUDESONIDE-FORMOTEROL FUMARATE 80-4.5 MCG/ACT IN AERO: 2.0000 | INHALATION_SPRAY | Freq: Two times a day (BID) | RESPIRATORY_TRACT | Status: DC

## 2013-02-09 NOTE — Patient Instructions (Addendum)
Symbicort 80 Take 1 puffs first thing in am and then another 1 puffs about 12 hours later.   When you run out of micardis, ok start back on cozaar and if cough gets please call  Please schedule a follow up office visit in 4 weeks, sooner if needed

## 2013-02-09 NOTE — Progress Notes (Signed)
Subjective:    Patient ID: Kristina Webb, female    DOB: Feb 01, 1956 MRN: [id number]   Brief patient profile:  61 yowf  Never smoker and starting in her 30's developed recurrent cough dx with asthma by [facility] allergic to dust and feathers rx asthmanex but not consistent with it referred 12/28/2012 to Pulmonary clinic by [facility] office for refractory cough.  12/28/2012 1st pulmonary eval cc chronic cough x years much worse x 4 weeks, abrupt onset with green mucus rx zpak and pred and benzoate and hydrocodon some better. Has not used asthmanex or albuterol during this flare. Also urinary incont with cough.  Was worse at hs now more of a daytime problem and mostly dry/ throat clearing with sensation of pnds but not excess mucus s assoc sob but does have gen ant chest discomfort x years with severe cough. rec  First take delsym two tsp every 12 hours and supplement if needed with hydrocodon every 4 hours to suppress the urge to cough.  . Once you have eliminated the cough for 3 straight days hydrocodon  then the delsym as tolerated.   Try protonix  Take 30-60 min before first meal of the day and Pepcid 20 mg one bedtime until cough is completely gone for at least a week without the need for cough suppression Zyrtec 10 mg one at bedtime  GERD diet  Prednisone 10 mg take  4 each am x 2 days,   2 each am x 2 days,  1 each am x 2 days and stop    01/28/2013 f/u ov/Dametri Ozburn maint on ppi and pepcid and zyrtec at hs  Chief Complaint  Patient presents with  . Follow-up    Cough had improved some on prednisone, but then developed a cold approx 1 wk ago and cough is back where it was last visit.  cough is worse during the day, dry rec Symbicort 80 Take 2 puffs first thing in am and then another 2 puffs about 12 hours later.  Stop cozar (losartan) and start micardis 80 mg one half daily  Change zyrtec to chlortrimeton 4 mg at bedtime and during the day as needed for drainage Prednisone 10 mg take  4 each am x  2 days,   2 each am x 2 days,  1 each am x2days and stop  Stay on Protonix before bfast and pepcid 20 mg at bedtime Take delsym two tsp every 12 hours and supplement if needed with  vicodin up to  every 4 hours to suppress the urge to cough  Once you have eliminated the cough for 3 straight days try reducing the vicodin first,    02/09/2013 f/u ov/[name] re chronic cough/symbicort helps but not using consisently Chief Complaint  Patient presents with  . Follow-up    Cough has improved some since the last visit, but she does c/o clearing her throat more often.   symbicort on 60 and it's the new sample I gave her at last ov so she really hasn't ever used it but does feel better on gerd/h1 rx. No limiting sob or need for saba   No obvious daytime variabilty or assoc sob or cp or chest tightness, subjective wheeze overt sinus or hb symptoms. No unusual exp hx or h/o childhood pna/ asthma or premature birth to her knowledge.   Sleeping ok without nocturnal  or early am exacerbation  of respiratory  c/o's or need for noct saba. Also denies any obvious fluctuation of symptoms with weather  or environmental changes or other aggravating or alleviating factors except as outlined above   Current Medications, Allergies, Past Medical History, Past Surgical History, Family History, and Social History were reviewed in [employer] record.  ROS  The following are not active complaints unless bolded sore throat, dysphagia, dental problems, itching, sneezing,  nasal congestion or excess/ purulent secretions, ear ache,   fever, chills, sweats, unintended wt loss, pleuritic or exertional cp, hemoptysis,  orthopnea pnd or leg swelling, presyncope, palpitations, heartburn, abdominal pain, anorexia, nausea, vomiting, diarrhea  or change in bowel or urinary habits, change in stools or urine, dysuria,hematuria,  rash, arthralgias, visual complaints, headache, numbness weakness or ataxia or problems  with walking or coordination,  change in mood/affect or memory.               Objective:   Physical Exam  amb wf nad 02/09/2013      218  Wt Readings from Last 3 Encounters:  01/28/13 218 lb 6.4 oz (99.066 kg)  12/28/12 215 lb (97.523 kg)     HEENT: nl dentition, turbinates, and orophanx. Nl external ear canals without cough reflex   NECK :  without JVD/Nodes/TM/ nl carotid upstrokes bilaterally   LUNGS: no acc muscle use, clear to A and P bilaterally without cough on insp or exp maneuvers   CV:  RRR  no s3 or murmur or increase in P2, no edema   ABD:  soft and nontender with nl excursion in the supine position. No bruits or organomegaly, bowel sounds nl  MS:  warm without deformities, calf tenderness, cyanosis or clubbing       cxr 12/08/12 Stable exam. No active cardiopulmonary disease.       Assessment & Plan:

## 2013-02-10 NOTE — Assessment & Plan Note (Signed)
Shouldn't make any difference micardis vs cozaar in theory but we have had some pts cough from cozar > Trial of micardis ? Improved cough 02/09/13 > back to cozaar when completes micardis to see if flares (reverse of therapeutic trial)

## 2013-02-10 NOTE — Assessment & Plan Note (Signed)
Cough is better despite not using symbicort but still has some symptoms that may be asthmatic in nature so asked her to start symbicort 80 2bid to see what difference if any it makes in her symptoms and if not convinced it helps stop it completely  In meantime continue max gerd rx and ok to add back cozaar to see if cough worsens  See instructions for specific recommendations which were reviewed directly with the patient who was given a copy with highlighter outlining the key components.

## 2013-03-09 ENCOUNTER — Ambulatory Visit: Payer: 59 | Admitting: Internal Medicine

## 2013-07-13 ENCOUNTER — Encounter: Payer: Self-pay | Admitting: Physician Assistant

## 2013-07-13 ENCOUNTER — Ambulatory Visit: Payer: 59 | Admitting: Physician Assistant

## 2013-07-13 VITALS — BP 120/78 | HR 72 | Temp 98.1°F | Resp 16 | Wt 215.0 lb

## 2013-07-13 DIAGNOSIS — K219 Gastro-esophageal reflux disease without esophagitis: Secondary | ICD-10-CM | POA: Insufficient documentation

## 2013-07-13 DIAGNOSIS — B351 Tinea unguium: Secondary | ICD-10-CM

## 2013-07-13 DIAGNOSIS — N63 Unspecified lump in unspecified breast: Secondary | ICD-10-CM

## 2013-07-13 DIAGNOSIS — I10 Essential (primary) hypertension: Secondary | ICD-10-CM

## 2013-07-13 NOTE — Progress Notes (Signed)
  Subjective:    Patient ID: Kristina Webb, female    DOB: 1955/09/26, 57 y.o.   MRN: [id number]  HPI Patient states that several months ago she noticed a Hersman/green discharge from above her right nipple at 12 or 1 o clock and she has been having "tooth ache" pain. + tenderness on her nipple and is cracked, but denies nipple discharge. Her mother died with breast cancer, she had a papilloma excision right breast in 2008.   Current Outpatient Prescriptions on File Prior to Visit  Medication Sig Dispense Refill  . budesonide-formoterol (SYMBICORT) 80-4.5 MCG/ACT inhaler Inhale 2 puffs into the lungs 2 (two) times daily.      . chlorpheniramine (CHLOR-TRIMETON) 4 MG tablet Take 4 mg by mouth every 6 (six) hours as needed for allergies.      . Cyanocobalamin (VITAMIN B 12 PO) Take 1 tablet by mouth daily.      . famotidine (PEPCID) 20 MG tablet Take 20 mg by mouth at bedtime.      . Ferrous Sulfate Dried (SLOW RELEASE IRON) 45 MG TBCR Take 1 tablet by mouth daily.      . furosemide (LASIX) 40 MG tablet Take 40 mg by mouth daily. And 1 extra as needed      . levothyroxine (SYNTHROID, LEVOTHROID) 75 MCG tablet Take 75 mcg by mouth daily before breakfast.      . magnesium oxide (MAG-OX) 400 MG tablet Take 400 mg by mouth daily.      . Multiple Vitamins-Minerals (ZINC PO) Take 1 tablet by mouth daily.      . pantoprazole (PROTONIX) 40 MG tablet Take 40 mg by mouth daily.      Marland Kitchen Phenylephrine-DM-GG-APAP (MUCINEX FAST-MAX CONGEST COLD PO) As needed      . POTASSIUM PO Take 1 tablet by mouth daily.      . sertraline (ZOLOFT) 100 MG tablet Take 100 mg by mouth daily.      Marland Kitchen albuterol (PROAIR HFA) 108 (90 BASE) MCG/ACT inhaler Inhale 2 puffs into the lungs every 6 (six) hours as needed for wheezing.       No current facility-administered medications on file prior to visit.   Past Medical History  Diagnosis Date  . Asthma   . Hypertension   . GERD (gastroesophageal reflux disease)   . Anemia   .  Deviated septum    Review of Systems  Constitutional: Negative.   HENT: Negative.   Eyes: Negative.   Respiratory: Negative.   Cardiovascular: Negative.   Gastrointestinal: Negative.        Objective:   Physical Exam  Constitutional: She appears well-developed and well-nourished.  Eyes: Conjunctivae are normal. Pupils are equal, round, and reactive to light.  Neck: Normal range of motion. Neck supple.  Pulmonary/Chest: Effort normal and breath sounds normal.  Abdominal: Soft.  Genitourinary:  Patient's right breast is not erythema, warm but she does have tenderness at 12 o clock. There is a mobile mass at 12-1 o clock questionable duct or mass.   Musculoskeletal: Normal range of motion.  Skin: Skin is warm and dry.  Left big toe superficial white fungus.       Assessment & Plan:    1. Breast lump or mass - MM Digital Diagnostic Unilat R; Future Do warm wet compresses on Right breast 2. Nail fungus Do terbinafine OTC if this does not work we will call in Lamisil.

## 2013-07-13 NOTE — Patient Instructions (Signed)
Please get over the counter Terbinafine and apply to your toenail two times a day for a month. If it is not better we can call you in a prescription.   Preventing Toenail Fungus from Recurring   Sanitize your shoes with Mycomist spray or a similar shoe sanitizer spray.  Follow the instructions on the bottle and dry them outside in the sun or with a hairdryer.  We also recommend repeating the sanitization once weekly in shoes you wear most often.   Throw away any shoes you have worn a significant amount without socks-fungus thrives in a warm moist environment and you want to avoid re-infection after your laser procedure   Bleach your socks with regular or color safe bleach   Change your socks regularly to keep your feet clean and dry (especially if you have sweaty feet)-if sweaty feet are a problem, let your doctor know-there is a great lotion that helps with this problem.   Clean your toenail clippers with alcohol before you use them if you do your own toenails and make sure to replace Emory boards and orange sticks regularly   If you get regular pedicures, bring your own instruments or go to a spa that sterilizes their instruments in an autoclave.

## 2013-07-20 ENCOUNTER — Other Ambulatory Visit: Payer: Self-pay | Admitting: Internal Medicine

## 2013-08-16 ENCOUNTER — Other Ambulatory Visit: Payer: Self-pay | Admitting: Physician Assistant

## 2013-08-16 MED ORDER — SERTRALINE HCL 100 MG PO TABS: ORAL_TABLET | ORAL | Status: DC

## 2013-09-18 ENCOUNTER — Encounter: Payer: Self-pay | Admitting: Internal Medicine

## 2013-09-18 DIAGNOSIS — N63 Unspecified lump in unspecified breast: Secondary | ICD-10-CM

## 2013-09-19 DIAGNOSIS — F324 Major depressive disorder, single episode, in partial remission: Secondary | ICD-10-CM | POA: Insufficient documentation

## 2013-09-19 DIAGNOSIS — G4733 Obstructive sleep apnea (adult) (pediatric): Secondary | ICD-10-CM | POA: Insufficient documentation

## 2013-09-19 DIAGNOSIS — J45909 Unspecified asthma, uncomplicated: Secondary | ICD-10-CM | POA: Insufficient documentation

## 2013-09-19 DIAGNOSIS — D649 Anemia, unspecified: Secondary | ICD-10-CM | POA: Insufficient documentation

## 2013-09-20 ENCOUNTER — Ambulatory Visit (INDEPENDENT_AMBULATORY_CARE_PROVIDER_SITE_OTHER): Payer: 59 | Admitting: Physician Assistant

## 2013-09-20 ENCOUNTER — Encounter: Payer: Self-pay | Admitting: Physician Assistant

## 2013-09-20 VITALS — BP 128/68 | HR 80 | Temp 97.9°F | Resp 16 | Ht 64.5 in | Wt 212.0 lb

## 2013-09-20 DIAGNOSIS — E559 Vitamin D deficiency, unspecified: Secondary | ICD-10-CM

## 2013-09-20 DIAGNOSIS — Z79899 Other long term (current) drug therapy: Secondary | ICD-10-CM

## 2013-09-20 DIAGNOSIS — I10 Essential (primary) hypertension: Secondary | ICD-10-CM

## 2013-09-20 DIAGNOSIS — R079 Chest pain, unspecified: Secondary | ICD-10-CM

## 2013-09-20 DIAGNOSIS — E119 Type 2 diabetes mellitus without complications: Secondary | ICD-10-CM

## 2013-09-20 DIAGNOSIS — E785 Hyperlipidemia, unspecified: Secondary | ICD-10-CM

## 2013-09-20 LAB — BASIC METABOLIC PANEL WITH GFR
BUN: 17 mg/dL (ref 6–23)
CO2: 26 mEq/L (ref 19–32)
Calcium: 9 mg/dL (ref 8.4–10.5)
Chloride: 102 mEq/L (ref 96–112)
Creat: 0.79 mg/dL (ref 0.50–1.10)
GFR, Est African American: >89 mL/min
GFR, Est Non African American: 84 mL/min
Glucose, Bld: 97 mg/dL (ref 70–99)
Potassium: 3.6 mEq/L (ref 3.5–5.3)
Sodium: 139 mEq/L (ref 135–145)

## 2013-09-20 LAB — HEPATIC FUNCTION PANEL
ALT: 30 U/L (ref 0–35)
AST: 24 U/L (ref 0–37)
Albumin: 4.2 g/dL (ref 3.5–5.2)
Alkaline Phosphatase: 83 U/L (ref 39–117)
Bilirubin, Direct: 0.1 mg/dL (ref 0.0–0.3)
Indirect Bilirubin: 0.3 mg/dL (ref 0.0–0.9)
Total Bilirubin: 0.4 mg/dL (ref 0.3–1.2)
Total Protein: 7.1 g/dL (ref 6.0–8.3)

## 2013-09-20 LAB — CBC WITH DIFFERENTIAL/PLATELET
Basophils Absolute: 0 10*3/uL (ref 0.0–0.1)
Basophils Relative: 1 % (ref 0–1)
Eosinophils Absolute: 0.3 10*3/uL (ref 0.0–0.7)
Eosinophils Relative: 4 % (ref 0–5)
HCT: 38.1 % (ref 36.0–46.0)
Hemoglobin: 12.9 g/dL (ref 12.0–15.0)
Lymphocytes Relative: 36 % (ref 12–46)
Lymphs Abs: 2.9 10*3/uL (ref 0.7–4.0)
MCH: 26.8 pg (ref 26.0–34.0)
MCHC: 33.9 g/dL (ref 30.0–36.0)
MCV: 79 fL (ref 78.0–100.0)
Monocytes Absolute: 0.4 10*3/uL (ref 0.1–1.0)
Monocytes Relative: 5 % (ref 3–12)
Neutro Abs: 4.4 10*3/uL (ref 1.7–7.7)
Neutrophils Relative %: 54 % (ref 43–77)
Platelets: 284 10*3/uL (ref 150–400)
RBC: 4.82 MIL/uL (ref 3.87–5.11)
RDW: 15.5 % (ref 11.5–15.5)
WBC: 8 10*3/uL (ref 4.0–10.5)

## 2013-09-20 LAB — LIPID PANEL
Cholesterol: 179 mg/dL (ref 0–200)
HDL: 48 mg/dL (ref >39)
LDL Cholesterol: 108 mg/dL — ABNORMAL HIGH (ref 0–99)
Total CHOL/HDL Ratio: 3.7 Ratio
Triglycerides: 115 mg/dL (ref <150)
VLDL: 23 mg/dL (ref 0–40)

## 2013-09-20 LAB — HEMOGLOBIN A1C
Hgb A1c MFr Bld: 6.2 % — ABNORMAL HIGH (ref <5.7)
Mean Plasma Glucose: 131 mg/dL — ABNORMAL HIGH (ref <117)

## 2013-09-20 LAB — TSH: TSH: 2.103 u[IU]/mL (ref 0.350–4.500)

## 2013-09-20 LAB — MAGNESIUM: Magnesium: 1.9 mg/dL (ref 1.5–2.5)

## 2013-09-20 MED ORDER — BUSPIRONE HCL 10 MG PO TABS: ORAL_TABLET | ORAL | Status: DC
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
## 2013-09-20 NOTE — Patient Instructions (Signed)
Bad carbs also include fruit juice, alcohol, and sweet tea. These are empty calories that do not signal to your brain that you are full.   Please remember the good carbs are still carbs which convert into sugar. So please measure them out no more than 1/2-1 cup of rice, oatmeal, pasta, and beans.  Veggies are however free foods! Pile them on.   I like lean protein at every meal such as chicken, [place], pork chops, cottage cheese, etc. Just do not fry these meats and please center your meal around vegetable, the meats should be a side dish.   No all fruit is created equal. Please see the list below, the fruit at the bottom is higher in sugars than the fruit at the top   Depression, Adult Depression refers to feeling sad, low, down in the dumps, blue, gloomy, or empty. In general, there are two kinds of depression: 1. Depression that we all experience from time to time because of upsetting life experiences, including the loss of a job or the ending of a relationship (normal sadness or normal grief). This kind of depression is considered normal, is short lived, and resolves within a few days to 2 weeks. (Depression experienced after the loss of a loved one is called bereavement. Bereavement often lasts longer than 2 weeks but normally gets better with time.) 2. Clinical depression, which lasts longer than normal sadness or normal grief or interferes with your ability to function at home, at work, and in school. It also interferes with your personal relationships. It affects almost every aspect of your life. Clinical depression is an illness. Symptoms of depression also can be caused by conditions other than normal sadness and grief or clinical depression. Examples of these conditions are listed as follows:  Physical illness Some physical illnesses, including underactive thyroid gland (hypothyroidism), severe anemia, specific types of cancer, diabetes, uncontrolled seizures, heart and lung problems,  strokes, and chronic pain are commonly associated with symptoms of depression.  Side effects of some prescription medicine In some people, certain types of prescription medicine can cause symptoms of depression.  Substance abuse Abuse of alcohol and illicit drugs can cause symptoms of depression. SYMPTOMS Symptoms of normal sadness and normal grief include the following:  Feeling sad or crying for short periods of time.  Not caring about anything (apathy).  Difficulty sleeping or sleeping too much.  No longer able to enjoy the things you used to enjoy.  Desire to be by oneself all the time (social isolation).  Lack of energy or motivation.  Difficulty concentrating or remembering.  Change in appetite or weight.  Restlessness or agitation. Symptoms of clinical depression include the same symptoms of normal sadness or normal grief and also the following symptoms:  Feeling sad or crying all the time.  Feelings of guilt or worthlessness.  Feelings of hopelessness or helplessness.  Thoughts of suicide or the desire to harm yourself (suicidal ideation).  Loss of touch with reality (psychotic symptoms). Seeing or hearing things that are not real (hallucinations) or having false beliefs about your life or the people around you (delusions and paranoia). DIAGNOSIS  The diagnosis of clinical depression usually is based on the severity and duration of the symptoms. Your caregiver also will ask you questions about your medical history and substance use to find out if physical illness, use of prescription medicine, or substance abuse is causing your depression. Your caregiver also may order blood tests. TREATMENT  Typically, normal sadness and normal grief  do not require treatment. However, sometimes antidepressant medicine is prescribed for bereavement to ease the depressive symptoms until they resolve. The treatment for clinical depression depends on the severity of your symptoms but  typically includes antidepressant medicine, counseling with a mental health professional, or a combination of both. Your caregiver will help to determine what treatment is best for you. Depression caused by physical illness usually goes away with appropriate medical treatment of the illness. If prescription medicine is causing depression, talk with your caregiver about stopping the medicine, decreasing the dose, or substituting another medicine. Depression caused by abuse of alcohol or illicit drugs abuse goes away with abstinence from these substances. Some adults need professional help in order to stop drinking or using drugs. SEEK IMMEDIATE CARE IF:  You have thoughts about hurting yourself or others.  You lose touch with reality (have psychotic symptoms).  You are taking medicine for depression and have a serious side effect. FOR MORE INFORMATION National [employer] on Mental Illness: www.nami.[employer]: https://carter.com/ Document Released: 08/09/2000 Document Revised: 02/11/2012 Document Reviewed: 11/11/2011 [facility] Patient Information 2014 [place].

## 2013-09-21 LAB — VITAMIN D 25 HYDROXY (VIT D DEFICIENCY, FRACTURES): Vit D, 25-Hydroxy: 47 ng/mL (ref 30–89)

## 2013-09-22 ENCOUNTER — Other Ambulatory Visit: Payer: Self-pay | Admitting: Internal Medicine

## 2013-11-02 ENCOUNTER — Other Ambulatory Visit: Payer: Self-pay | Admitting: Physician Assistant

## 2013-11-13 ENCOUNTER — Other Ambulatory Visit: Payer: Self-pay | Admitting: Physician Assistant

## 2013-11-23 ENCOUNTER — Telehealth: Payer: Self-pay

## 2013-11-23 MED ORDER — AZITHROMYCIN 250 MG PO TABS: ORAL_TABLET | ORAL | Status: DC

## 2013-11-23 NOTE — Telephone Encounter (Signed)
Patient aware of RX sent to pharmacy.  

## 2013-12-21 ENCOUNTER — Ambulatory Visit: Payer: Self-pay | Admitting: Internal Medicine

## 2014-01-04 ENCOUNTER — Ambulatory Visit: Payer: Self-pay | Admitting: Physician Assistant

## 2014-01-13 ENCOUNTER — Encounter: Payer: Self-pay | Admitting: Physician Assistant

## 2014-01-13 ENCOUNTER — Ambulatory Visit (INDEPENDENT_AMBULATORY_CARE_PROVIDER_SITE_OTHER): Payer: 59 | Admitting: Physician Assistant

## 2014-01-13 VITALS — BP 128/72 | HR 72 | Temp 98.1°F | Resp 16

## 2014-01-13 DIAGNOSIS — E119 Type 2 diabetes mellitus without complications: Secondary | ICD-10-CM | POA: Insufficient documentation

## 2014-01-13 DIAGNOSIS — I10 Essential (primary) hypertension: Secondary | ICD-10-CM

## 2014-01-13 DIAGNOSIS — Z79899 Other long term (current) drug therapy: Secondary | ICD-10-CM

## 2014-01-13 DIAGNOSIS — E559 Vitamin D deficiency, unspecified: Secondary | ICD-10-CM

## 2014-01-13 DIAGNOSIS — E1169 Type 2 diabetes mellitus with other specified complication: Secondary | ICD-10-CM | POA: Insufficient documentation

## 2014-01-13 DIAGNOSIS — E785 Hyperlipidemia, unspecified: Secondary | ICD-10-CM

## 2014-01-13 LAB — BASIC METABOLIC PANEL WITH GFR
BUN: 20 mg/dL (ref 6–23)
CO2: 27 mEq/L (ref 19–32)
Calcium: 9.2 mg/dL (ref 8.4–10.5)
Chloride: 101 mEq/L (ref 96–112)
Creat: 0.82 mg/dL (ref 0.50–1.10)
GFR, Est African American: >89 mL/min
GFR, Est Non African American: 80 mL/min
Glucose, Bld: 74 mg/dL (ref 70–99)
Potassium: 3.9 mEq/L (ref 3.5–5.3)
Sodium: 139 mEq/L (ref 135–145)

## 2014-01-13 LAB — CBC WITH DIFFERENTIAL/PLATELET
Basophils Absolute: 0 10*3/uL (ref 0.0–0.1)
Basophils Relative: 0 % (ref 0–1)
Eosinophils Absolute: 0.4 10*3/uL (ref 0.0–0.7)
Eosinophils Relative: 4 % (ref 0–5)
HCT: 37.5 % (ref 36.0–46.0)
Hemoglobin: 12.4 g/dL (ref 12.0–15.0)
Lymphocytes Relative: 35 % (ref 12–46)
Lymphs Abs: 3.2 10*3/uL (ref 0.7–4.0)
MCH: 26.6 pg (ref 26.0–34.0)
MCHC: 33.1 g/dL (ref 30.0–36.0)
MCV: 80.3 fL (ref 78.0–100.0)
Monocytes Absolute: 0.5 10*3/uL (ref 0.1–1.0)
Monocytes Relative: 6 % (ref 3–12)
Neutro Abs: 5 10*3/uL (ref 1.7–7.7)
Neutrophils Relative %: 55 % (ref 43–77)
Platelets: 293 10*3/uL (ref 150–400)
RBC: 4.67 MIL/uL (ref 3.87–5.11)
RDW: 15.3 % (ref 11.5–15.5)
WBC: 9.1 10*3/uL (ref 4.0–10.5)

## 2014-01-13 LAB — HEPATIC FUNCTION PANEL
ALT: 34 U/L (ref 0–35)
AST: 25 U/L (ref 0–37)
Albumin: 3.9 g/dL (ref 3.5–5.2)
Alkaline Phosphatase: 72 U/L (ref 39–117)
Bilirubin, Direct: 0.1 mg/dL (ref 0.0–0.3)
Indirect Bilirubin: 0.2 mg/dL (ref 0.2–1.2)
Total Bilirubin: 0.3 mg/dL (ref 0.2–1.2)
Total Protein: 7.1 g/dL (ref 6.0–8.3)

## 2014-01-13 LAB — HEMOGLOBIN A1C
Hgb A1c MFr Bld: 6.2 % — ABNORMAL HIGH (ref <5.7)
Mean Plasma Glucose: 131 mg/dL — ABNORMAL HIGH (ref <117)

## 2014-01-13 LAB — LIPID PANEL
Cholesterol: 151 mg/dL (ref 0–200)
HDL: 52 mg/dL (ref >39)
LDL Cholesterol: 72 mg/dL (ref 0–99)
Total CHOL/HDL Ratio: 2.9 Ratio
Triglycerides: 134 mg/dL (ref <150)
VLDL: 27 mg/dL (ref 0–40)

## 2014-01-13 LAB — MAGNESIUM: Magnesium: 2 mg/dL (ref 1.5–2.5)

## 2014-01-13 MED ORDER — CYCLOBENZAPRINE HCL 10 MG PO TABS: ORAL_TABLET | ORAL | Status: DC

## 2014-01-13 NOTE — Progress Notes (Signed)
Assessment and Plan:  Hypertension: Continue medication, monitor blood pressure at home. Continue DASH diet. Cholesterol: Continue diet and exercise. Check cholesterol.  Diabetes-Continue diet and exercise. Check A1C Vitamin D Def- check level and continue medications.  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    Continue diet and meds as discussed. Further disposition pending results of labs. Discussed med's effects and SE's.    HPI 58 y.o. female  presents for 3 month follow up with hypertension, hyperlipidemia, diabetes and vitamin D. Her blood pressure has been controlled at home, today their BP is BP: 128/72 mmHg She does workout, she walks 2 miles 5 days a week. She denies chest pain, shortness of breath, dizziness.  She is not on cholesterol medication and denies myalgias. Her cholesterol is at goal. The cholesterol last visit was:   Lab Results  Component Value Date   CHOL 179 09/20/2013   HDL 48 09/20/2013   LDLCALC 108* 09/20/2013   TRIG 115 09/20/2013   CHOLHDL 3.7 09/20/2013   She has been working on diet and exercise for Diabetes, and denies paresthesia of the feet, polydipsia and polyuria. Last A1C in the office was:  Lab Results  Component Value Date   HGBA1C 6.2* 09/20/2013   Patient is on Vitamin D supplement.   She is on thyroid medication. Her medication was not changed last visit. Patient denies nervousness, palpitations and weight changes.  Lab Results  Component Value Date   TSH 2.103 09/20/2013  .   Current Medications:  Current Outpatient Prescriptions on File Prior to Visit  Medication Sig Dispense Refill  . albuterol (PROAIR HFA) 108 (90 BASE) MCG/ACT inhaler Inhale 2 puffs into the lungs every 6 (six) hours as needed for wheezing.      . budesonide-formoterol (SYMBICORT) 80-4.5 MCG/ACT inhaler Inhale 2 puffs into the  lungs 2 (two) times daily.      . busPIRone (BUSPAR) 10 MG tablet TAKE 1/2-1 PILL AS NEEDED FOR ANXIETY  30 tablet  1  . chlorpheniramine (CHLOR-TRIMETON) 4 MG tablet Take 4 mg by mouth every 6 (six) hours as needed for allergies.      . Cyanocobalamin (VITAMIN B 12 PO) Take 1 tablet by mouth daily.      . famotidine (PEPCID) 20 MG tablet Take 20 mg by mouth at bedtime.      . Ferrous Sulfate Dried (SLOW RELEASE IRON) 45 MG TBCR Take 1 tablet by mouth daily.      . furosemide (LASIX) 40 MG tablet TAKE 1 TABLET EVERY MORNING FOR BLOOD PRESSURE AND FLUID  90 tablet  6  . levothyroxine (SYNTHROID, LEVOTHROID) 75 MCG tablet TAKE 1 TABLET DAILY  90 tablet  3  . losartan (COZAAR) 100 MG tablet TAKE 1 TABLET EVERY DAY  30 tablet  5  . magnesium oxide (MAG-OX) 400 MG tablet Take 400 mg by mouth daily.      . metFORMIN (GLUCOPHAGE-XR) 500 MG 24 hr tablet Take 500 mg by mouth 2 (two) times daily.      . Multiple Vitamins-Minerals (ZINC PO) Take 1 tablet by mouth daily.      . pantoprazole (PROTONIX) 40 MG tablet Take 40 mg by mouth daily.      Marland Kitchen Phenylephrine-DM-GG-APAP (MUCINEX FAST-MAX CONGEST COLD PO) As needed      . POTASSIUM PO Take 1 tablet by mouth daily.      Marland Kitchen  sertraline (ZOLOFT) 100 MG tablet Take 1-2 tablets daily for anxiety  60 tablet  3   No current facility-administered medications on file prior to visit.   Medical History:  Past Medical History  Diagnosis Date  . Hypertension   . GERD (gastroesophageal reflux disease)   . Deviated septum   . Anemia   . Asthma   . Anxiety   . Depression   . OSA (obstructive sleep apnea)   . Hyperlipemia   . Diabetes    Allergies:  Allergies  Allergen Reactions  . Doxycycline   . Levaquin [Levofloxacin In D5w]   . Prednisone      Review of Systems: [X]  = complains of  [ ]  = denies  General: Fatigue [ ]  Fever [ ]  Chills [ ]  Weakness [ ]   Insomnia [ ]  Eyes: Redness [ ]  Blurred vision [ ]  Diplopia [ ]   ENT: Congestion [ ]  Sinus Pain [ ]   Post Nasal Drip [ ]  Sore Throat [ ]  Earache [ ]   Cardiac: Chest pain/pressure [ ]  SOB [ ]  Orthopnea [ ]   Palpitations [ ]   Paroxysmal nocturnal dyspnea[ ]  Claudication [ ]  Edema [ ]   Pulmonary: Cough [ ]  Wheezing[ ]   SOB [ ]   Snoring [ ]   GI: Nausea [ ]  Vomiting[ ]  Dysphagia[ ]  Heartburn[ ]  Abdominal pain [ ]  Constipation [ ] ; Diarrhea [ ] ; BRBPR [ ]  Melena[ ]  GU: Hematuria[ ]  Dysuria [ ]  Nocturia[ ]  Urgency [ ]   Hesitancy [ ]  Discharge [ ]  Neuro: Headaches[X ] Vertigo[ ]  Paresthesias[ ]  Spasm [ ]  Speech changes [ ]  Incoordination [ ]   Ortho: Arthritis [ ]  Joint pain [ ]  Muscle pain [ ]  Joint swelling [ ]  Back Pain [ ]  Skin:  Rash [ ]   Pruritis [ ]  Change in skin lesion [ ]   Psych: Depression[ ]  Anxiety[ ]  Confusion [ ]  Memory loss [ ]   Heme/Lypmh: Bleeding [ ]  Bruising [ ]  Enlarged lymph nodes [ ]   Endocrine: Visual blurring [ ]  Paresthesia [ ]  Polyuria [ ]  Polydypsea [ ]    Heat/cold intolerance [ ]  Hypoglycemia [ ]   Family history- Review and unchanged Social history- Review and unchanged Physical Exam: BP 128/72  Pulse 72  Temp(Src) 98.1 F (36.7 C)  Resp 16 Wt Readings from Last 3 Encounters:  09/20/13 212 lb (96.163 kg)  07/13/13 215 lb (97.523 kg)  02/09/13 218 lb 6.4 oz (99.066 kg)   General Appearance: Well nourished, in no apparent distress. Eyes: PERRLA, EOMs, conjunctiva no swelling or erythema Sinuses: No Frontal/maxillary tenderness ENT/Mouth: Ext aud canals clear, TMs without erythema, bulging. No erythema, swelling, or exudate on post pharynx.  Tonsils not swollen or erythematous. Hearing normal.  Neck: Supple, thyroid normal. + TMJ right side Respiratory: Respiratory effort normal, BS equal bilaterally without rales, rhonchi, wheezing or stridor.  Cardio: RRR with no MRGs. Brisk peripheral pulses without edema.  Abdomen: Soft, + BS.  Non tender, no guarding, rebound, hernias, masses. Lymphatics: Non tender without lymphadenopathy.  Musculoskeletal: Full ROM, 5/5  strength, normal gait.  Skin: Warm, dry without rashes, lesions, ecchymosis.  Neuro: Cranial nerves intact. No cerebellar symptoms. Sensation intact.  Psych: Awake and oriented X 3, normal affect, Insight and Judgment appropriate.    Vicie Mutters 2:18 PM

## 2014-01-13 NOTE — Patient Instructions (Signed)
What is the TMJ? The temporomandibular (tem-PUH-ro-man-DIB-yoo-ler) joint, or the TMJ, connects the upper and lower jawbones. This joint allows the jaw to open wide and move back and forth when you chew, talk, or yawn.There are also several muscles that help this joint move. There can be muscle tightness and pain in the muscle that can cause several symptoms.  What causes TMJ pain? There are many causes of TMJ pain. Repeated chewing (for example, chewing gum) and clenching your teeth can cause pain in the joint. Some TMJ pain has no obvious cause. What can I do to ease the pain? There are many things you can do to help your pain get better. When you have pain:  Eat soft foods and stay away from chewy foods (for example, taffy) Try to use both sides of your mouth to chew Don't chew gum Don't open your mouth wide (for example, during yawning or singing) Don't bite your cheeks or fingernails Lower your amount of stress and worry Applying a warm, damp washcloth to the joint may help. Over-the-counter pain medicines such as ibuprofen (one brand: Advil) or acetaminophen (one brand: Tylenol) might also help. Do not use these medicines if you are allergic to them or if your doctor told you not to use them. How can I stop the pain from coming back? When your pain is better, you can do these exercises to make your muscles stronger and to keep the pain from coming back:  Resisted mouth opening: Place your thumb or two fingers under your chin and open your mouth slowly, pushing up lightly on your chin with your thumb. Hold for three to six seconds. Close your mouth slowly. Resisted mouth closing: Place your thumbs under your chin and your two index fingers on the ridge between your mouth and the bottom of your chin. Push down lightly on your chin as you close your mouth. Tongue up: Slowly open and close your mouth while keeping the tongue touching the roof of the mouth. Side-to-side jaw movement: Place an  object about one fourth of an inch thick (for example, two tongue depressors) between your front teeth. Slowly move your jaw from side to side. Increase the thickness of the object as the exercise becomes easier Forward jaw movement: Place an object about one fourth of an inch thick between your front teeth and move the bottom jaw forward so that the bottom teeth are in front of the top teeth. Increase the thickness of the object as the exercise becomes easier. These exercises should not be painful. If it hurts to do these exercises, stop doing them and talk to your family doctor.     Bad carbs also include fruit juice, alcohol, and sweet tea. These are empty calories that do not signal to your brain that you are full.   Please remember the good carbs are still carbs which convert into sugar. So please measure them out no more than 1/2-1 cup of rice, oatmeal, pasta, and beans.  Veggies are however free foods! Pile them on.   I like lean protein at every meal such as chicken, turkey, pork chops, cottage cheese, etc. Just do not fry these meats and please center your meal around vegetable, the meats should be a side dish.   No all fruit is created equal. Please see the list below, the fruit at the bottom is higher in sugars than the fruit at the top    

## 2014-01-14 LAB — TSH: TSH: 1.505 u[IU]/mL (ref 0.350–4.500)

## 2014-01-14 LAB — VITAMIN D 25 HYDROXY (VIT D DEFICIENCY, FRACTURES): Vit D, 25-Hydroxy: 40 ng/mL (ref 30–89)

## 2014-01-26 ENCOUNTER — Other Ambulatory Visit: Payer: Self-pay

## 2014-01-26 MED ORDER — SERTRALINE HCL 100 MG PO TABS: ORAL_TABLET | ORAL | Status: DC

## 2014-02-07 ENCOUNTER — Other Ambulatory Visit: Payer: Self-pay | Admitting: Internal Medicine

## 2014-02-07 MED ORDER — PHENTERMINE HCL 37.5 MG PO TABS: ORAL_TABLET | ORAL | Status: DC

## 2014-03-16 ENCOUNTER — Other Ambulatory Visit: Payer: Self-pay | Admitting: Physician Assistant

## 2014-03-26 ENCOUNTER — Other Ambulatory Visit: Payer: Self-pay | Admitting: Physician Assistant

## 2014-04-04 ENCOUNTER — Other Ambulatory Visit: Payer: Self-pay | Admitting: Physician Assistant

## 2014-04-04 MED ORDER — PANTOPRAZOLE SODIUM 40 MG PO TBEC: 40.0000 mg | DELAYED_RELEASE_TABLET | Freq: Every day | ORAL | Status: DC

## 2014-04-15 ENCOUNTER — Ambulatory Visit (INDEPENDENT_AMBULATORY_CARE_PROVIDER_SITE_OTHER): Payer: 59 | Admitting: Physician Assistant

## 2014-04-15 ENCOUNTER — Encounter: Payer: Self-pay | Admitting: Physician Assistant

## 2014-04-15 ENCOUNTER — Other Ambulatory Visit: Payer: Self-pay | Admitting: Physician Assistant

## 2014-04-15 VITALS — BP 128/80 | HR 84 | Temp 97.9°F | Resp 16 | Ht 64.5 in | Wt 202.0 lb

## 2014-04-15 DIAGNOSIS — E1122 Type 2 diabetes mellitus with diabetic chronic kidney disease: Secondary | ICD-10-CM | POA: Insufficient documentation

## 2014-04-15 DIAGNOSIS — E1129 Type 2 diabetes mellitus with other diabetic kidney complication: Secondary | ICD-10-CM

## 2014-04-15 DIAGNOSIS — N183 Chronic kidney disease, stage 3 unspecified: Secondary | ICD-10-CM | POA: Insufficient documentation

## 2014-04-15 DIAGNOSIS — F32A Depression, unspecified: Secondary | ICD-10-CM

## 2014-04-15 DIAGNOSIS — E559 Vitamin D deficiency, unspecified: Secondary | ICD-10-CM

## 2014-04-15 DIAGNOSIS — N182 Chronic kidney disease, stage 2 (mild): Secondary | ICD-10-CM

## 2014-04-15 DIAGNOSIS — Z79899 Other long term (current) drug therapy: Secondary | ICD-10-CM

## 2014-04-15 DIAGNOSIS — E669 Obesity, unspecified: Secondary | ICD-10-CM

## 2014-04-15 DIAGNOSIS — E785 Hyperlipidemia, unspecified: Secondary | ICD-10-CM

## 2014-04-15 DIAGNOSIS — N189 Chronic kidney disease, unspecified: Secondary | ICD-10-CM

## 2014-04-15 DIAGNOSIS — F3289 Other specified depressive episodes: Secondary | ICD-10-CM

## 2014-04-15 DIAGNOSIS — F329 Major depressive disorder, single episode, unspecified: Secondary | ICD-10-CM

## 2014-04-15 DIAGNOSIS — I10 Essential (primary) hypertension: Secondary | ICD-10-CM

## 2014-04-15 LAB — CBC WITH DIFFERENTIAL/PLATELET
Basophils Absolute: 0 10*3/uL (ref 0.0–0.1)
Basophils Relative: 0 % (ref 0–1)
Eosinophils Absolute: 0.6 10*3/uL (ref 0.0–0.7)
Eosinophils Relative: 6 % — ABNORMAL HIGH (ref 0–5)
HCT: 38.6 % (ref 36.0–46.0)
Hemoglobin: 13 g/dL (ref 12.0–15.0)
Lymphocytes Relative: 35 % (ref 12–46)
Lymphs Abs: 3.3 10*3/uL (ref 0.7–4.0)
MCH: 27.1 pg (ref 26.0–34.0)
MCHC: 33.7 g/dL (ref 30.0–36.0)
MCV: 80.6 fL (ref 78.0–100.0)
Monocytes Absolute: 0.5 10*3/uL (ref 0.1–1.0)
Monocytes Relative: 5 % (ref 3–12)
Neutro Abs: 5.1 10*3/uL (ref 1.7–7.7)
Neutrophils Relative %: 54 % (ref 43–77)
Platelets: 301 10*3/uL (ref 150–400)
RBC: 4.79 MIL/uL (ref 3.87–5.11)
RDW: 15.1 % (ref 11.5–15.5)
WBC: 9.4 10*3/uL (ref 4.0–10.5)

## 2014-04-15 MED ORDER — PREDNISONE 20 MG PO TABS: ORAL_TABLET | ORAL | Status: DC

## 2014-04-15 MED ORDER — PHENTERMINE HCL 37.5 MG PO TABS: ORAL_TABLET | ORAL | Status: DC

## 2014-04-15 NOTE — Progress Notes (Signed)
Assessment and Plan:  Hypertension: Continue medication, monitor blood pressure at home. Continue DASH diet. Cholesterol: Continue diet and exercise. Check cholesterol.  Diabetes with CKD-Continue diet and exercise, ACE. Check A1C and BMP Vitamin D Def- check level and continue medications.  Obesity with co morbidities- long discussion about weight loss, diet, and exercise  Can continue the patient on phentermine- hand out given   Continue diet and meds as discussed. Further disposition pending results of labs. Discussed med's effects and SE's.    HPI 58 y.o. female  presents for 3 month follow up with hypertension, hyperlipidemia, diabetes and vitamin D. Her blood pressure has been controlled at home, today their BP is BP: 128/80 mmHg She does workout, walk. She denies chest pain, shortness of breath, dizziness.  She is on cholesterol medication and denies myalgias. Her cholesterol is at goal. The cholesterol last visit was:   Lab Results  Component Value Date   CHOL 151 01/13/2014   HDL 52 01/13/2014   LDLCALC 72 01/13/2014   TRIG 134 01/13/2014   CHOLHDL 2.9 01/13/2014   She has been working on diet and exercise for Diabetes, and denies nausea, paresthesia of the feet and polydipsia. Last A1C in the office was:  Lab Results  Component Value Date   HGBA1C 6.2* 01/13/2014   Patient is on Vitamin D supplement. Lab Results  Component Value Date   VD25OH 40 01/13/2014     She BMI is Body mass index is 34.15 kg/(m^2)., she is working on diet and exercise and has done well, she has been on phentermine for 1.5 months.  Wt Readings from Last 3 Encounters:  04/15/14 202 lb (91.627 kg)  09/20/13 212 lb (96.163 kg)  07/13/13 215 lb (97.523 kg)     Current Medications:  Current Outpatient Prescriptions on File Prior to Visit  Medication Sig Dispense Refill  . albuterol (PROAIR HFA) 108 (90 BASE) MCG/ACT inhaler Inhale 2 puffs into the lungs every 6 (six) hours as needed for wheezing.       . budesonide-formoterol (SYMBICORT) 80-4.5 MCG/ACT inhaler Inhale 2 puffs into the lungs 2 (two) times daily.      . busPIRone (BUSPAR) 10 MG tablet TAKE 1/2-1 PILL AS NEEDED FOR ANXIETY  30 tablet  1  . chlorpheniramine (CHLOR-TRIMETON) 4 MG tablet Take 4 mg by mouth every 6 (six) hours as needed for allergies.      . Cyanocobalamin (VITAMIN B 12 PO) Take 1 tablet by mouth daily.      . cyclobenzaprine (FLEXERIL) 10 MG tablet TAKE 1 TO 2 TABLETS BY MOUTH DAILY AT NIGHT AS NEEDED FOR HEADACHE AND JAW PAIN  60 tablet  1  . famotidine (PEPCID) 20 MG tablet Take 20 mg by mouth at bedtime.      . Ferrous Sulfate Dried (SLOW RELEASE IRON) 45 MG TBCR Take 1 tablet by mouth daily.      . furosemide (LASIX) 40 MG tablet TAKE 1 TABLET EVERY MORNING FOR BLOOD PRESSURE AND FLUID  90 tablet  6  . levothyroxine (SYNTHROID, LEVOTHROID) 75 MCG tablet TAKE 1 TABLET DAILY  90 tablet  3  . losartan (COZAAR) 100 MG tablet TAKE 1 TABLET EVERY DAY  30 tablet  5  . magnesium oxide (MAG-OX) 400 MG tablet Take 400 mg by mouth daily.      . metFORMIN (GLUCOPHAGE-XR) 500 MG 24 hr tablet Take 500 mg by mouth 2 (two) times daily.      . metFORMIN (GLUCOPHAGE-XR) 500 MG 24  hr tablet TAKE 1 TABLET BY MOUTH TWICE DAILY WITH FOOD  60 tablet  3  . Multiple Vitamins-Minerals (ZINC PO) Take 1 tablet by mouth daily.      . pantoprazole (PROTONIX) 40 MG tablet Take 1 tablet (40 mg total) by mouth daily.  90 tablet  1  . phentermine (ADIPEX-P) 37.5 MG tablet Take 1/2 to 1 tablet daily for dieting and weight loss  30 tablet  1  . Phenylephrine-DM-GG-APAP (MUCINEX FAST-MAX CONGEST COLD PO) As needed      . POTASSIUM PO Take 1 tablet by mouth daily.      . sertraline (ZOLOFT) 100 MG tablet Take 1-2 tablets daily for anxiety  180 tablet  3   No current facility-administered medications on file prior to visit.   Medical History:  Past Medical History  Diagnosis Date  . Hypertension   . GERD (gastroesophageal reflux disease)   .  Deviated septum   . Anemia   . Asthma   . Anxiety   . Depression   . OSA (obstructive sleep apnea)   . Hyperlipemia   . Diabetes    Allergies:  Allergies  Allergen Reactions  . Doxycycline   . Levaquin [Levofloxacin In D5w]   . Prednisone      Review of Systems: [X]  = complains of  [ ]  = denies  General: Fatigue [ ]  Fever [ ]  Chills [ ]  Weakness [ ]   Insomnia [ ]  Eyes: Redness [ ]  Blurred vision [ ]  Diplopia [ ]   ENT: Congestion [ ]  Sinus Pain [ ]  Post Nasal Drip [ ]  Sore Throat [ ]  Earache [ ]   Cardiac: Chest pain/pressure [ ]  SOB [ ]  Orthopnea [ ]   Palpitations [ ]   Paroxysmal nocturnal dyspnea[ ]  Claudication [ ]  Edema [ ]   Pulmonary: Cough [ ]  Wheezing[ ]   SOB [ ]   Snoring [ ]   GI: Nausea [ ]  Vomiting[ ]  Dysphagia[ ]  Heartburn[ ]  Abdominal pain [ ]  Constipation [ ] ; Diarrhea [ ] ; BRBPR [ ]  Melena[ ]  GU: Hematuria[ ]  Dysuria [ ]  Nocturia[ ]  Urgency [ ]   Hesitancy [ ]  Discharge [ ]  Neuro: Headaches[ ]  Vertigo[ ]  Paresthesias[ ]  Spasm [ ]  Speech changes [ ]  Incoordination [ ]   Ortho: Arthritis [ ]  Joint pain [ ]  Muscle pain [ ]  Joint swelling [ ]  Back Pain [ ]  Skin:  Rash [ ]   Pruritis [ ]  Change in skin lesion [ ]   Psych: Depression[ ]  Anxiety[ ]  Confusion [ ]  Memory loss [ ]   Heme/Lypmh: Bleeding [ ]  Bruising [ ]  Enlarged lymph nodes [ ]   Endocrine: Visual blurring [ ]  Paresthesia [ ]  Polyuria [ ]  Polydypsea [ ]    Heat/cold intolerance [ ]  Hypoglycemia [ ]   Family history- Review and unchanged Social history- Review and unchanged Physical Exam: BP 128/80  Pulse 84  Temp(Src) 97.9 F (36.6 C)  Resp 16  Ht 5' 4.5" (1.638 m)  Wt 202 lb (91.627 kg)  BMI 34.15 kg/m2 Wt Readings from Last 3 Encounters:  04/15/14 202 lb (91.627 kg)  09/20/13 212 lb (96.163 kg)  07/13/13 215 lb (97.523 kg)   General Appearance: Well nourished, in no apparent distress. Eyes: PERRLA, EOMs, conjunctiva no swelling or erythema Sinuses: No Frontal/maxillary tenderness ENT/Mouth: Ext aud  canals clear, TMs without erythema, bulging. No erythema, swelling, or exudate on post pharynx.  Tonsils not swollen or erythematous. Hearing normal.  Neck: Supple, thyroid normal.  Respiratory: Respiratory effort normal, BS equal bilaterally without rales,  rhonchi, wheezing or stridor.  Cardio: RRR with no MRGs. Brisk peripheral pulses without edema.  Abdomen: Soft, + BS.  Non tender, no guarding, rebound, hernias, masses. Lymphatics: Non tender without lymphadenopathy.  Musculoskeletal: Full ROM, 5/5 strength, normal gait.  Skin: Warm, dry without rashes, lesions, ecchymosis.  Neuro: Cranial nerves intact. No cerebellar symptoms. Sensation intact.  Psych: Awake and oriented X 3, normal affect, Insight and Judgment appropriate.    Vicie Mutters 12:09 PM

## 2014-04-15 NOTE — Patient Instructions (Signed)

## 2014-04-16 LAB — TSH: TSH: 2.042 u[IU]/mL (ref 0.350–4.500)

## 2014-04-16 LAB — HEMOGLOBIN A1C
Hgb A1c MFr Bld: 6.3 % — ABNORMAL HIGH (ref <5.7)
Mean Plasma Glucose: 134 mg/dL — ABNORMAL HIGH (ref <117)

## 2014-04-16 LAB — HEPATIC FUNCTION PANEL
ALT: 33 U/L (ref 0–35)
AST: 26 U/L (ref 0–37)
Albumin: 4.3 g/dL (ref 3.5–5.2)
Alkaline Phosphatase: 79 U/L (ref 39–117)
Bilirubin, Direct: 0.1 mg/dL (ref 0.0–0.3)
Indirect Bilirubin: 0.3 mg/dL (ref 0.2–1.2)
Total Bilirubin: 0.4 mg/dL (ref 0.2–1.2)
Total Protein: 7.3 g/dL (ref 6.0–8.3)

## 2014-04-16 LAB — LIPID PANEL
Cholesterol: 173 mg/dL (ref 0–200)
HDL: 58 mg/dL (ref >39)
LDL Cholesterol: 93 mg/dL (ref 0–99)
Total CHOL/HDL Ratio: 3 Ratio
Triglycerides: 109 mg/dL (ref <150)
VLDL: 22 mg/dL (ref 0–40)

## 2014-04-16 LAB — BASIC METABOLIC PANEL WITH GFR
BUN: 18 mg/dL (ref 6–23)
CO2: 30 mEq/L (ref 19–32)
Calcium: 9.3 mg/dL (ref 8.4–10.5)
Chloride: 100 mEq/L (ref 96–112)
Creat: 0.85 mg/dL (ref 0.50–1.10)
GFR, Est African American: 89 mL/min
GFR, Est Non African American: 77 mL/min
Glucose, Bld: 96 mg/dL (ref 70–99)
Potassium: 4.2 mEq/L (ref 3.5–5.3)
Sodium: 139 mEq/L (ref 135–145)

## 2014-04-16 LAB — INSULIN, FASTING: Insulin fasting, serum: 15 u[IU]/mL (ref 3–28)

## 2014-04-16 LAB — MAGNESIUM: Magnesium: 2 mg/dL (ref 1.5–2.5)

## 2014-04-16 LAB — VITAMIN D 25 HYDROXY (VIT D DEFICIENCY, FRACTURES): Vit D, 25-Hydroxy: 42 ng/mL (ref 30–89)

## 2014-05-05 ENCOUNTER — Other Ambulatory Visit: Payer: Self-pay | Admitting: Physician Assistant

## 2014-05-29 ENCOUNTER — Other Ambulatory Visit: Payer: Self-pay | Admitting: Physician Assistant

## 2014-06-08 ENCOUNTER — Other Ambulatory Visit: Payer: Self-pay

## 2014-06-08 MED ORDER — AZITHROMYCIN 250 MG PO TABS: ORAL_TABLET | ORAL | Status: AC

## 2014-06-20 ENCOUNTER — Encounter: Payer: Self-pay | Admitting: Physician Assistant

## 2014-06-20 ENCOUNTER — Ambulatory Visit (INDEPENDENT_AMBULATORY_CARE_PROVIDER_SITE_OTHER): Payer: 59 | Admitting: Physician Assistant

## 2014-06-20 VITALS — BP 128/82 | HR 84 | Temp 97.9°F | Resp 16 | Ht 64.5 in | Wt 195.0 lb

## 2014-06-20 DIAGNOSIS — N182 Chronic kidney disease, stage 2 (mild): Secondary | ICD-10-CM

## 2014-06-20 DIAGNOSIS — J4520 Mild intermittent asthma, uncomplicated: Secondary | ICD-10-CM

## 2014-06-20 DIAGNOSIS — Z1159 Encounter for screening for other viral diseases: Secondary | ICD-10-CM

## 2014-06-20 DIAGNOSIS — Z0001 Encounter for general adult medical examination with abnormal findings: Secondary | ICD-10-CM

## 2014-06-20 DIAGNOSIS — F32A Depression, unspecified: Secondary | ICD-10-CM

## 2014-06-20 DIAGNOSIS — I10 Essential (primary) hypertension: Secondary | ICD-10-CM

## 2014-06-20 DIAGNOSIS — F329 Major depressive disorder, single episode, unspecified: Secondary | ICD-10-CM

## 2014-06-20 DIAGNOSIS — R6889 Other general symptoms and signs: Secondary | ICD-10-CM

## 2014-06-20 DIAGNOSIS — G4733 Obstructive sleep apnea (adult) (pediatric): Secondary | ICD-10-CM

## 2014-06-20 DIAGNOSIS — D649 Anemia, unspecified: Secondary | ICD-10-CM

## 2014-06-20 DIAGNOSIS — K2100 Gastro-esophageal reflux disease with esophagitis, without bleeding: Secondary | ICD-10-CM

## 2014-06-20 DIAGNOSIS — E785 Hyperlipidemia, unspecified: Secondary | ICD-10-CM

## 2014-06-20 DIAGNOSIS — E669 Obesity, unspecified: Secondary | ICD-10-CM

## 2014-06-20 MED ORDER — PHENTERMINE HCL 37.5 MG PO TABS: ORAL_TABLET | ORAL | Status: DC

## 2014-06-20 NOTE — Patient Instructions (Signed)

## 2014-06-20 NOTE — Progress Notes (Signed)
Complete Physical  Assessment and Plan: 1. Essential hypertension - EKG 12-Lead - [place], RETROPERITNL ABD,  LTD  2. Asthma, mild intermittent, uncomplicated symbicort samples given  3. OSA (obstructive sleep apnea) Suggest CPAP  4. Gastroesophageal reflux disease with esophagitis Continue protonix and can try adding zantac at night for 1 week due to cough  5. CKD (chronic kidney disease) stage 2, GFR 60-89 ml/min Check BMP  6. Anemia, unspecified anemia type Check CBC, she is off iron  7. Depression remission  8. Hyperlipemia -continue medications, check lipids, decrease fatty foods, increase activity.   9. Obesity Obesity with co morbidities- long discussion about weight loss, diet, and exercise, continue phentermine through holidays will decrease use after next visit.   10. Encounter for general adult medical examination with abnormal findings - CBC with Differential - BASIC METABOLIC PANEL WITH GFR - Lipid panel - Hepatic function panel - TSH - Hemoglobin A1c - Insulin, fasting - Vit D  25 hydroxy (rtn osteoporosis monitoring) - Urinalysis, Routine w reflex microscopic - Microalbumin / creatinine urine ratio - Vitamin B12 - Magnesium - Iron and TIBC - Ferritin  11. Screening for viral disease - Hepatitis A antibody, total - Hepatitis B core antibody, total - Hepatitis B e antibody - Hepatitis B surface antibody - Hepatitis C antibody   Discussed med's effects and SE's. Screening labs and tests as requested with regular follow-up as recommended.  HPI  58 y.o. female  presents for a complete physical.   Her blood pressure has been controlled at home, today their BP is BP: 128/82 mmHg She does workout. She denies chest pain, shortness of breath, dizziness.  She is not on cholesterol medication and denies myalgias. Her cholesterol is at goal. The cholesterol last visit was:   Lab Results  Component Value Date   CHOL 173 04/15/2014   HDL 58 04/15/2014   LDLCALC 93 04/15/2014   TRIG 109 04/15/2014   CHOLHDL 3.0 04/15/2014    She has been working on diet and exercise for prediabetes, she is on metformin, and denies paresthesia of the feet, polydipsia and polyuria. Last A1C in the office was:  Lab Results  Component Value Date   HGBA1C 6.3* 04/15/2014   Patient is on Vitamin D supplement.   Lab Results  Component Value Date   VD25OH 42 04/15/2014     BMI is Body mass index is 32.97 kg/(m^2)., she is working on diet and exercise and has done well. She has been on phentermine for 3-4 months.  Wt Readings from Last 3 Encounters:  06/20/14 195 lb (88.451 kg)  04/15/14 202 lb (91.627 kg)  09/20/13 212 lb (96.163 kg)   She is on thyroid medication. Her medication was not changed last visit. Patient denies nervousness, palpitations and weight changes.  Lab Results  Component Value Date   TSH 2.042 04/15/2014  .  She has asthma, she is on albuterol PRN, had recent bronchitis and still has dry cough. She is out of her symbicort inhaler.   Current Medications:  Current Outpatient Prescriptions on File Prior to Visit  Medication Sig Dispense Refill  . albuterol (PROAIR HFA) 108 (90 BASE) MCG/ACT inhaler Inhale 2 puffs into the lungs every 6 (six) hours as needed for wheezing.      Marland Kitchen amitriptyline (ELAVIL) 10 MG tablet TAKE 1 TO 2 TABLETS BY MOUTH DAILY AT BEDTIME  60 tablet  2  . budesonide-formoterol (SYMBICORT) 80-4.5 MCG/ACT inhaler Inhale 2 puffs into the lungs 2 (two) times daily.      Marland Kitchen  chlorpheniramine (CHLOR-TRIMETON) 4 MG tablet Take 4 mg by mouth every 6 (six) hours as needed for allergies.      . Cyanocobalamin (VITAMIN B 12 PO) Take 1 tablet by mouth daily.      . furosemide (LASIX) 40 MG tablet TAKE 1 TABLET EVERY MORNING FOR BLOOD PRESSURE AND FLUID  90 tablet  6  . levothyroxine (SYNTHROID, LEVOTHROID) 75 MCG tablet TAKE 1 TABLET DAILY  90 tablet  3  . losartan (COZAAR) 100 MG tablet TAKE 1 TABLET EVERY DAY  30 tablet  5  .  metFORMIN (GLUCOPHAGE-XR) 500 MG 24 hr tablet TAKE 1 TABLET BY MOUTH TWICE DAILY WITH FOOD  60 tablet  3  . Multiple Vitamins-Minerals (ZINC PO) Take 1 tablet by mouth daily.      . pantoprazole (PROTONIX) 40 MG tablet Take 1 tablet (40 mg total) by mouth daily.  90 tablet  1  . phentermine (ADIPEX-P) 37.5 MG tablet Take 1/2 to 1 tablet daily for dieting and weight loss  30 tablet  1  . sertraline (ZOLOFT) 100 MG tablet Take 1-2 tablets daily for anxiety  180 tablet  3   No current facility-administered medications on file prior to visit.   Health Maintenance:   Immunization History  Administered Date(s) Administered  . Influenza Split 04/26/2013  . Influenza Whole 05/26/2012  . Td 08/27/2007   Tetanus: 2009 Pneumovax: Flu vaccine: 2015 Zostavax: LMP:2007 Pap: 2013 neg due 2016- Dr. Perlie Gold.  MGM:06/2013  DEXA: Colonoscopy: 12/2012 normal EGD: 2010 Last Dental Exam: Dr. Lin Landsman, q  6 months.  Last Eye Exam: Dr. Claudean Kinds 01/13/2014  Patient Care Team: Unk Pinto, MD as PCP - General (Internal Medicine) Mayme Genta, MD as Consulting Physician (Gastroenterology) Newt Minion, MD as Consulting Physician (Orthopedic Surgery) Lennon Alstrom, MD as Consulting Physician (Neurology) Jeanell Sparrow, MD as Consulting Physician (Ophthalmology)  Allergies:  Allergies  Allergen Reactions  . Doxycycline   . Levaquin [Levofloxacin In D5w]   . Prednisone    Medical History:  Past Medical History  Diagnosis Date  . Hypertension   . GERD (gastroesophageal reflux disease)   . Deviated septum   . Anemia   . Asthma   . Anxiety   . Depression   . OSA (obstructive sleep apnea)   . Hyperlipemia   . Diabetes    Surgical History:  Past Surgical History  Procedure Laterality Date  . Back surgery     Family History:  Family History  Problem Relation Age of Onset  . Emphysema Mother     smoked  . Allergies Mother   . Asthma Mother   . Heart disease Mother   .  Breast cancer Mother     with mets to Bone  . Prostate cancer Father   . Brain cancer Father    Social History:  History  Substance Use Topics  . Smoking status: Never Smoker   . Smokeless tobacco: Never Used  . Alcohol Use: No   Review of Systems: [X]  = complains of  [ ]  = denies  General: Fatigue [ ]  Fever [ ]  Chills [ ]  Weakness [ ]   Insomnia [ ] Weight change [ ]  Night sweats [ ]   Change in appetite [ ]  Head: Head Trauma [ ]  Eyes: Wears glasses or corrective lens [ ]  Redness [ ]  Blurred vision [ ]  Diplopia [ ]  Discharge [ ]  Floaters [ ]  [id number] [ ]  hearing loss [ ]  Tinnitus [ ]  Ear Discharge [ ]   Congestion [ ]  Sinus Pain [ ]  Post Nasal Drip [ ]  Nose Bleeds [ ]  Rhinorrhea [ ]    Difficulty Swallowing [ ]  Snoring [ ]  Sore Throat [ ]  Cardiac:   Chest pain/pressure [ ]  SOB [ ]  Orthopnea [ ]   Palpitations [ ]   Paroxysmal nocturnal dyspnea[ ]  Claudication [ ]  Edema [ ]  Difficulty walking around block or climbing stairs [ ]  Pulmonary: Cough [ ]  Wheezing[ ]   SOB [ ]   Pleurisy [ ]  Asthma [ ]  GI: Nausea [ ]  Vomiting[ ]  Dysphagia[ ]  Heartburn[ ]  Abdominal pain [ ]  Constipation [ ] ; Diarrhea [ ]  BRBPR [ ]  Melena[ ]  Bloating [ ]  Hemorrhoids [ ]  Incontinence [ ]  GU: Hematuria[ ]  Dysuria [ ]  Nocturia[ ]  Urgency [ ]   Hesitancy [ ]  Discharge [ ]  Frequency [ ]  Incontinence [ ]  Breast:  Dimpling [ ]  Breast lumps [ ]   Breast Lesions [ ]  Nipple discharge [ ]    Neuro: Headaches[ ]  Vertigo[ ]  Paresthesias[ ]  Spasm [ ]  Speech changes [ ]  Incoordination [ ]  Dizziness [ ]  Numbness [ ]  Ortho: Arthritis [ ]  Joint pain [ ]  Muscle pain [ ]  Joint swelling [ ]  Back Pain [ ]  Weakness [ ]  Stiffness [ ]  Skin:  Rash [ ]   Pruritis [ ]  Change in skin lesion [ ]  Change in hair [ ]  Change in nails [ ]  Psych: Depression[ ]  Anxiety[ ]  Stress [ ]  Confusion [ ]  Memory loss [ ]   Heme/Lymph: Bleeding [ ]  Bruising [ ]  History of anemia [ ]  Enlarged lymph nodes [ ]   Endocrine: Visual blurring [ ]  Paresthesia [ ]  Polyuria [ ]   Polydipsia [ ]  Polyphagia [ ]   Heat/cold intolerance [ ]  Hypoglycemia [ ]  Thyroid Issues [ ]  Diabetes [ ]   Physical Exam: Estimated body mass index is 32.97 kg/(m^2) as calculated from the following:   Height as of this encounter: 5' 4.5" (1.638 m).   Weight as of this encounter: 195 lb (88.451 kg). BP 128/82  Pulse 84  Temp(Src) 97.9 F (36.6 C)  Resp 16  Ht 5' 4.5" (1.638 m)  Wt 195 lb (88.451 kg)  BMI 32.97 kg/m2 General Appearance: Well nourished, in no apparent distress.  Eyes: PERRLA, EOMs, conjunctiva no swelling or erythema, normal fundi and vessels.  Sinuses: No Frontal/maxillary tenderness  ENT/Mouth: Ext aud canals clear, normal light reflex with TMs without erythema, bulging. Good dentition. No erythema, swelling, or exudate on post pharynx. Tonsils not swollen or erythematous. Hearing normal.  Neck: Supple, thyroid normal. No bruits  Respiratory: Respiratory effort normal, BS equal bilaterally without rales, rhonchi, wheezing or stridor.  Cardio: RRR without murmurs, rubs or gallops. Brisk peripheral pulses without edema.  Chest: symmetric, with normal excursions and percussion.  Breasts: defer Abdomen: Soft, mild epigastric tenderness, no guarding, rebound, hernias, masses, or organomegaly. .  Lymphatics: Non tender without lymphadenopathy.  Genitourinary: defer Musculoskeletal: Full ROM all peripheral extremities,5/5 strength, and normal gait.  Skin: Warm, dry without rashes, lesions, ecchymosis. Neuro: Cranial nerves intact, reflexes equal bilaterally. Normal muscle tone, no cerebellar symptoms. Sensation intact.  Psych: Awake and oriented X 3, normal affect, Insight and Judgment appropriate.   EKG: WNL no changes. AORTA SCAN: WNL    Vicie Mutters 3:29 PM [facility]

## 2014-06-21 ENCOUNTER — Encounter: Payer: Self-pay | Admitting: Internal Medicine

## 2014-06-21 LAB — URINALYSIS, ROUTINE W REFLEX MICROSCOPIC
Bilirubin Urine: NEGATIVE
Glucose, UA: NEGATIVE mg/dL
Hgb urine dipstick: NEGATIVE
Ketones, ur: NEGATIVE mg/dL
Leukocytes, UA: NEGATIVE
Nitrite: NEGATIVE
Protein, ur: NEGATIVE mg/dL
Specific Gravity, Urine: <1.005 — ABNORMAL LOW (ref 1.005–1.030)
Urobilinogen, UA: 0.2 mg/dL (ref 0.0–1.0)
pH: 7 (ref 5.0–8.0)

## 2014-06-21 LAB — BASIC METABOLIC PANEL WITH GFR
BUN: 15 mg/dL (ref 6–23)
CO2: 27 mEq/L (ref 19–32)
Calcium: 9 mg/dL (ref 8.4–10.5)
Chloride: 102 mEq/L (ref 96–112)
Creat: 0.79 mg/dL (ref 0.50–1.10)
GFR, Est African American: >89 mL/min
GFR, Est Non African American: 83 mL/min
Glucose, Bld: 90 mg/dL (ref 70–99)
Potassium: 3.5 mEq/L (ref 3.5–5.3)
Sodium: 141 mEq/L (ref 135–145)

## 2014-06-21 LAB — HEPATITIS B SURFACE ANTIBODY,QUALITATIVE: Hep B S Ab: NEGATIVE

## 2014-06-21 LAB — CBC WITH DIFFERENTIAL/PLATELET
Basophils Absolute: 0 10*3/uL (ref 0.0–0.1)
Basophils Relative: 0 % (ref 0–1)
Eosinophils Absolute: 0.3 10*3/uL (ref 0.0–0.7)
Eosinophils Relative: 3 % (ref 0–5)
HCT: 37.3 % (ref 36.0–46.0)
Hemoglobin: 12 g/dL (ref 12.0–15.0)
Lymphocytes Relative: 34 % (ref 12–46)
Lymphs Abs: 3.4 10*3/uL (ref 0.7–4.0)
MCH: 26.7 pg (ref 26.0–34.0)
MCHC: 32.2 g/dL (ref 30.0–36.0)
MCV: 82.9 fL (ref 78.0–100.0)
Monocytes Absolute: 0.4 10*3/uL (ref 0.1–1.0)
Monocytes Relative: 4 % (ref 3–12)
Neutro Abs: 5.9 10*3/uL (ref 1.7–7.7)
Neutrophils Relative %: 59 % (ref 43–77)
Platelets: 258 10*3/uL (ref 150–400)
RBC: 4.5 MIL/uL (ref 3.87–5.11)
RDW: 14.7 % (ref 11.5–15.5)
WBC: 10 10*3/uL (ref 4.0–10.5)

## 2014-06-21 LAB — VITAMIN D 25 HYDROXY (VIT D DEFICIENCY, FRACTURES): Vit D, 25-Hydroxy: 42 ng/mL (ref 30–89)

## 2014-06-21 LAB — TSH: TSH: 1.815 u[IU]/mL (ref 0.350–4.500)

## 2014-06-21 LAB — FERRITIN: Ferritin: 39 ng/mL (ref 10–291)

## 2014-06-21 LAB — IRON AND TIBC
%SAT: 12 % — ABNORMAL LOW (ref 20–55)
Iron: 43 ug/dL (ref 42–145)
TIBC: 352 ug/dL (ref 250–470)
UIBC: 309 ug/dL (ref 125–400)

## 2014-06-21 LAB — HEPATIC FUNCTION PANEL
ALT: 26 U/L (ref 0–35)
AST: 21 U/L (ref 0–37)
Albumin: 4.1 g/dL (ref 3.5–5.2)
Alkaline Phosphatase: 67 U/L (ref 39–117)
Bilirubin, Direct: 0.1 mg/dL (ref 0.0–0.3)
Indirect Bilirubin: 0.3 mg/dL (ref 0.2–1.2)
Total Bilirubin: 0.4 mg/dL (ref 0.2–1.2)
Total Protein: 6.8 g/dL (ref 6.0–8.3)

## 2014-06-21 LAB — INSULIN, FASTING: Insulin fasting, serum: 8.9 u[IU]/mL (ref 2.0–19.6)

## 2014-06-21 LAB — MAGNESIUM: Magnesium: 1.8 mg/dL (ref 1.5–2.5)

## 2014-06-21 LAB — HEPATITIS C ANTIBODY: HCV Ab: NEGATIVE

## 2014-06-21 LAB — LIPID PANEL
Cholesterol: 179 mg/dL (ref 0–200)
HDL: 55 mg/dL (ref >39)
LDL Cholesterol: 105 mg/dL — ABNORMAL HIGH (ref 0–99)
Total CHOL/HDL Ratio: 3.3 Ratio
Triglycerides: 96 mg/dL (ref <150)
VLDL: 19 mg/dL (ref 0–40)

## 2014-06-21 LAB — MICROALBUMIN / CREATININE URINE RATIO
Creatinine, Urine: 19.1 mg/dL
Microalb, Ur: <0.2 mg/dL (ref <2.0)

## 2014-06-21 LAB — HEMOGLOBIN A1C
Hgb A1c MFr Bld: 6.3 % — ABNORMAL HIGH (ref <5.7)
Mean Plasma Glucose: 134 mg/dL — ABNORMAL HIGH (ref <117)

## 2014-06-21 LAB — HEPATITIS B CORE ANTIBODY, TOTAL: Hep B Core Total Ab: NONREACTIVE

## 2014-06-21 LAB — HEPATITIS A ANTIBODY, TOTAL: Hep A Total Ab: NONREACTIVE

## 2014-06-21 LAB — VITAMIN B12: Vitamin B-12: 606 pg/mL (ref 211–911)

## 2014-06-22 LAB — HEPATITIS B E ANTIBODY: Hepatitis Be Antibody: NONREACTIVE

## 2014-06-23 ENCOUNTER — Other Ambulatory Visit: Payer: Self-pay | Admitting: Internal Medicine

## 2014-07-28 ENCOUNTER — Other Ambulatory Visit: Payer: Self-pay | Admitting: *Deleted

## 2014-07-28 MED ORDER — LOSARTAN POTASSIUM 100 MG PO TABS: 100.0000 mg | ORAL_TABLET | Freq: Every day | ORAL | Status: DC

## 2014-08-06 ENCOUNTER — Other Ambulatory Visit: Payer: Self-pay | Admitting: Physician Assistant

## 2014-08-24 ENCOUNTER — Other Ambulatory Visit: Payer: Self-pay | Admitting: Internal Medicine

## 2014-09-02 ENCOUNTER — Other Ambulatory Visit: Payer: Self-pay | Admitting: Physician Assistant

## 2014-09-02 ENCOUNTER — Other Ambulatory Visit: Payer: Self-pay | Admitting: Emergency Medicine

## 2014-09-04 ENCOUNTER — Encounter: Payer: Self-pay | Admitting: *Deleted

## 2014-09-20 ENCOUNTER — Other Ambulatory Visit: Payer: Self-pay | Admitting: Physician Assistant

## 2014-10-06 ENCOUNTER — Encounter: Payer: Self-pay | Admitting: Physician Assistant

## 2014-10-06 ENCOUNTER — Ambulatory Visit (INDEPENDENT_AMBULATORY_CARE_PROVIDER_SITE_OTHER): Payer: 59 | Admitting: Physician Assistant

## 2014-10-06 VITALS — BP 118/70 | HR 100 | Temp 98.0°F | Resp 18 | Ht 64.5 in | Wt 196.0 lb

## 2014-10-06 DIAGNOSIS — E669 Obesity, unspecified: Secondary | ICD-10-CM

## 2014-10-06 DIAGNOSIS — E119 Type 2 diabetes mellitus without complications: Secondary | ICD-10-CM

## 2014-10-06 DIAGNOSIS — E785 Hyperlipidemia, unspecified: Secondary | ICD-10-CM

## 2014-10-06 DIAGNOSIS — I10 Essential (primary) hypertension: Secondary | ICD-10-CM

## 2014-10-06 DIAGNOSIS — E559 Vitamin D deficiency, unspecified: Secondary | ICD-10-CM

## 2014-10-06 DIAGNOSIS — N182 Chronic kidney disease, stage 2 (mild): Secondary | ICD-10-CM

## 2014-10-06 DIAGNOSIS — Z79899 Other long term (current) drug therapy: Secondary | ICD-10-CM

## 2014-10-06 DIAGNOSIS — G4733 Obstructive sleep apnea (adult) (pediatric): Secondary | ICD-10-CM

## 2014-10-06 LAB — HEPATIC FUNCTION PANEL
ALT: 26 U/L (ref 0–35)
AST: 23 U/L (ref 0–37)
Albumin: 4.3 g/dL (ref 3.5–5.2)
Alkaline Phosphatase: 70 U/L (ref 39–117)
Bilirubin, Direct: 0.1 mg/dL (ref 0.0–0.3)
Indirect Bilirubin: 0.3 mg/dL (ref 0.2–1.2)
Total Bilirubin: 0.4 mg/dL (ref 0.2–1.2)
Total Protein: 7.4 g/dL (ref 6.0–8.3)

## 2014-10-06 LAB — CBC WITH DIFFERENTIAL/PLATELET
Basophils Absolute: 0 10*3/uL (ref 0.0–0.1)
Basophils Relative: 0 % (ref 0–1)
Eosinophils Absolute: 0.5 10*3/uL (ref 0.0–0.7)
Eosinophils Relative: 4 % (ref 0–5)
HCT: 40 % (ref 36.0–46.0)
Hemoglobin: 13.3 g/dL (ref 12.0–15.0)
Lymphocytes Relative: 32 % (ref 12–46)
Lymphs Abs: 3.8 10*3/uL (ref 0.7–4.0)
MCH: 26.9 pg (ref 26.0–34.0)
MCHC: 33.3 g/dL (ref 30.0–36.0)
MCV: 80.8 fL (ref 78.0–100.0)
MPV: 9.2 fL (ref 8.6–12.4)
Monocytes Absolute: 0.7 10*3/uL (ref 0.1–1.0)
Monocytes Relative: 6 % (ref 3–12)
Neutro Abs: 7 10*3/uL (ref 1.7–7.7)
Neutrophils Relative %: 58 % (ref 43–77)
Platelets: 326 10*3/uL (ref 150–400)
RBC: 4.95 MIL/uL (ref 3.87–5.11)
RDW: 14.8 % (ref 11.5–15.5)
WBC: 12 10*3/uL — ABNORMAL HIGH (ref 4.0–10.5)

## 2014-10-06 LAB — LIPID PANEL
Cholesterol: 194 mg/dL (ref 0–200)
HDL: 65 mg/dL (ref >39)
LDL Cholesterol: 99 mg/dL (ref 0–99)
Total CHOL/HDL Ratio: 3 Ratio
Triglycerides: 150 mg/dL — ABNORMAL HIGH (ref <150)
VLDL: 30 mg/dL (ref 0–40)

## 2014-10-06 LAB — BASIC METABOLIC PANEL WITH GFR
BUN: 23 mg/dL (ref 6–23)
CO2: 31 mEq/L (ref 19–32)
Calcium: 9.4 mg/dL (ref 8.4–10.5)
Chloride: 101 mEq/L (ref 96–112)
Creat: 0.78 mg/dL (ref 0.50–1.10)
GFR, Est African American: >89 mL/min
GFR, Est Non African American: 85 mL/min
Glucose, Bld: 97 mg/dL (ref 70–99)
Potassium: 3.7 mEq/L (ref 3.5–5.3)
Sodium: 141 mEq/L (ref 135–145)

## 2014-10-06 LAB — MAGNESIUM: Magnesium: 2 mg/dL (ref 1.5–2.5)

## 2014-10-06 NOTE — Patient Instructions (Addendum)
Cut the lasix in half  Can call Dr. Toy Cookey to get evaluated for dental sleep appliance. # [phone]  OR you can try Dr. Clearence Ped in [facility] # [phone]  We will send notes. Call and get price quote on both.   Anticancer: a new way of living Shanon Brow Servan-Schreiber,MD,PhD  Before you even begin to attack a weight-loss plan, it pays to remember this: You are not fat. You have fat. Losing weight isn't about blame or shame; it's simply another achievement to accomplish. Dieting is like any other skill-you have to buckle down and work at it. As long as you act in a smart, reasonable way, you'll ultimately get where you want to be. Here are some weight loss pearls for you.  1. It's Not a Diet. It's a Lifestyle Thinking of a diet as something you're on and suffering through only for the short term doesn't work. To shed weight and keep it off, you need to make permanent changes to the way you eat. It's OK to indulge occasionally, of course, but if you cut calories temporarily and then revert to your old way of eating, you'll gain back the weight quicker than you can say yo-yo. Use it to lose it. Research shows that one of the best predictors of long-term weight loss is how many pounds you drop in the first month. For that reason, nutritionists often suggest being stricter for the first two weeks of your new eating strategy to build momentum. Cut out added sugar and alcohol and avoid unrefined carbs. After that, figure out how you can reincorporate them in a way that's healthy and maintainable.  2. There's a Right Way to Exercise Working out burns calories and fat and boosts your metabolism by building muscle. But those trying to lose weight are notorious for overestimating the number of calories they burn and underestimating the amount they take in. Unfortunately, your system is biologically programmed to hold on to extra pounds and that means when you start exercising, your body senses the  deficit and ramps up its hunger signals. If you're not diligent, you'll eat everything you burn and then some. Use it to lose it. Cardio gets all the exercise glory, but strength and interval training are the real heroes. They help you build lean muscle, which in turn increases your metabolism and calorie-burning ability 3. Don't Overreact to Mild Hunger Some people have a hard time losing weight because of hunger anxiety. To them, being hungry is bad-something to be avoided at all costs-so they carry snacks with them and eat when they don't need to. Others eat because they're stressed out or bored. While you never want to get to the point of being ravenous (that's when bingeing is likely to happen), a hunger pang, a craving, or the fact that it's 3:00 p.m. should not send you racing for the vending machine or obsessing about the energy bar in your purse. Ideally, you should put off eating until your stomach is growling and it's difficult to concentrate.  Use it to lose it. When you feel the urge to eat, use the HALT method. Ask yourself, Am I really hungry? Or am I angry or anxious, lonely or bored, or tired? If you're still not certain, try the apple test. If you're truly hungry, an apple should seem delicious; if it doesn't, something else is going on. Or you can try drinking water and making yourself busy, if you are still hungry try a healthy  snack.  4. Not All Calories Are Created Equal The mechanics of weight loss are pretty simple: Take in fewer calories than you use for energy. But the kind of food you eat makes all the difference. Processed food that's high in saturated fat and refined starch or sugar can cause inflammation that disrupts the hormone signals that tell your brain you're full. The result: You eat a lot more.  Use it to lose it. Clean up your diet. Swap in whole, unprocessed foods, including vegetables, lean protein, and healthy fats that will fill you up and give you the biggest  nutritional bang for your calorie buck. In a few weeks, as your brain starts receiving regular hunger and fullness signals once again, you'll notice that you feel less hungry overall and naturally start cutting back on the amount you eat.  5. Protein, Produce, and Plant-Based Fats Are Your Weight-Loss Trinity Here's why eating the three Ps regularly will help you drop pounds. Protein fills you up. You need it to build lean muscle, which keeps your metabolism humming so that you can torch more fat. People in a weight-loss program who ate double the recommended daily allowance for protein (about 110 grams for a 150-pound woman) lost 70 percent of their weight from fat, while people who ate the RDA lost only about 40 percent, one study found. Produce is packed with filling fiber. "It's very difficult to consume too many calories if you're eating a lot of vegetables. Example: Three cups of broccoli is a lot of food, yet only 93 calories. (Fruit is another story. It can be easy to overeat and can contain a lot of calories from sugar, so be sure to monitor your intake.) Plant-based fats like olive oil and those in avocados and nuts are healthy and extra satiating.  Use it to lose it. Aim to incorporate each of the three Ps into every meal and snack. People who eat protein throughout the day are able to keep weight off, according to a study in the [place] of Clinical Nutrition. In addition to meat, poultry and seafood, good sources are beans, lentils, eggs, tofu, and yogurt. As for fat, keep portion sizes in check by measuring out salad dressing, oil, and nut butters (shoot for one to two tablespoons). Finally, eat veggies or a little fruit at every meal. People who did that consumed 308 fewer calories but didn't feel any hungrier than when they didn't eat more produce.  7. How You Eat Is As Important As What You Eat In order for your brain to register that you're full, you need to focus on what you're  eating. Sit down whenever you eat, preferably at a table. Turn off the TV or computer, put down your phone, and look at your food. Smell it. Chew slowly, and don't put another bite on your fork until you swallow. When women ate lunch this attentively, they consumed 30 percent less when snacking later than those who listened to an audiobook at lunchtime, according to a study in the [place] of Nutrition. 8. Weighing Yourself Really Works The scale provides the best evidence about whether your efforts are paying off. Seeing the numbers tick up or down or stagnate is motivation to keep going-or to rethink your approach. A 2015 study at [facility] found that daily weigh-ins helped people lose more weight, keep it off, and maintain that loss, even after two years. Use it to lose it. Step on the scale at the same time every  day for the best results. If your weight shoots up several pounds from one weigh-in to the next, don't freak out. Eating a lot of salt the night before or having your period is the likely culprit. The number should return to normal in a day or two. It's a steady climb that you need to do something about. 9. Too Much Stress and Too Little Sleep Are Your Enemies When you're tired and frazzled, your body cranks up the production of cortisol, the stress hormone that can cause carb cravings. Not getting enough sleep also boosts your levels of ghrelin, a hormone associated with hunger, while suppressing leptin, a hormone that signals fullness and satiety. People on a diet who slept only five and a half hours a night for two weeks lost 55 percent less fat and were hungrier than those who slept eight and a half hours, according to a study in the [place]. Use it to lose it. Prioritize sleep, aiming for seven hours or more a night, which research shows helps lower stress. And make sure you're getting quality zzz's. If a snoring spouse or a fidgety cat wakes you  up frequently throughout the night, you may end up getting the equivalent of just four hours of sleep, according to a study from [facility]. Keep pets out of the bedroom, and use a white-noise app to drown out snoring. 10. You Will Hit a plateau-And You Can Bust Through It As you slim down, your body releases much less leptin, the fullness hormone.  If you're not strength training, start right now. Building muscle can raise your metabolism to help you overcome a plateau. To keep your body challenged and burning calories, incorporate new moves and more intense intervals into your workouts or add another sweat session to your weekly routine. Alternatively, cut an extra 100 calories or so a day from your diet. Now that you've lost weight, your body simply doesn't need as much fuel.   Ways to cut 100 calories  1. Eat your eggs with hot sauce OR salsa instead of cheese.  Eggs are great for breakfast, but many people consider eggs and cheese to be BFFs. Instead of cheese-1 oz. of cheddar has 114 calories-top your eggs with hot sauce, which contains no calories and helps with satiety and metabolism. Salsa is also a great option!!  2. Top your toast, waffles or pancakes with mashed berries instead of jelly or syrup. Half a cup of berries-fresh, frozen or thawed-has about 40 calories, compared with 2 tbsp. of maple syrup or jelly, which both have about 100 calories. The berries will also give you a good punch of fiber, which helps keep you full and satisfied and won't spike blood sugar quickly like the jelly or syrup. 3. Swap the non-fat latte for black coffee with a splash of half-and-half. Contrary to its name, that non-fat latte has 130 calories and a startling 19g of carbohydrates per 16 oz. serving. Replacing that 'light' drinkable dessert with a black coffee with a splash of half-and-half saves you more than 100 calories per 16 oz. serving. 4. Sprinkle salads with freeze-dried raspberries instead of  dried cranberries. If you want a sweet addition to your nutritious salad, stay away from dried cranberries. They have a whopping 130 calories per  cup and 30g carbohydrates. Instead, sprinkle freeze-dried raspberries guilt-free and save more than 100 calories per  cup serving, adding 3g of belly-filling fiber. 5. Go for mustard in place of mayo on your  sandwich. Mustard can add really nice flavor to any sandwich, and there are tons of varieties, from spicy to honey. A serving of mayo is 95 calories, versus 10 calories in a serving of mustard. 6. Choose a DIY salad dressing instead of the store-bought kind. Mix Dijon or whole grain mustard with low-fat Kefir or red wine vinegar and garlic. 7. Use hummus as a spread instead of a dip. Use hummus as a spread on a high-fiber cracker or tortilla with a sandwich and save on calories without sacrificing taste. 8. Pick just one salad "accessory." Salad isn't automatically a calorie winner. It's easy to over-accessorize with toppings. Instead of topping your salad with nuts, avocado and cranberries (all three will clock in at 313 calories), just pick one. The next day, choose a different accessory, which will also keep your salad interesting. You don't wear all your jewelry every day, right? 9. Ditch the white pasta in favor of spaghetti squash. One cup of cooked spaghetti squash has about 40 calories, compared with traditional spaghetti, which comes with more than 200. Spaghetti squash is also nutrient-dense. It's a good source of fiber and Vitamins A and C, and it can be eaten just like you would eat pasta-with a great tomato sauce and [place] meatballs or with pesto, tofu and spinach, for example. 10. Dress up your chili, soups and stews with non-fat [place] yogurt instead of sour cream. Just a 'dollop' of sour cream can set you back 115 calories and a whopping 12g of fat-seven of which are of the artery-clogging variety. Added bonus: [place] yogurt is packed  with muscle-building protein, calcium and B Vitamins. 11. Mash cauliflower instead of mashed potatoes. One cup of traditional mashed potatoes-in all their creamy goodness-has more than 200 calories, compared to mashed cauliflower, which you can typically eat for less than 100 calories per 1 cup serving. Cauliflower is a great source of the antioxidant indole-3-carbinol (I3C), which may help reduce the risk of some cancers, like breast cancer. 12. Ditch the ice cream sundae in favor of a [place] yogurt parfait. Instead of a cup of ice cream or fro-yo for dessert, try 1 cup of nonfat Greek yogurt topped with fresh berries and a sprinkle of cacao nibs. Both toppings are packed with antioxidants, which can help reduce cellular inflammation and oxidative damage. And the comparison is a no-brainer: One cup of ice cream has about 275 calories; one cup of frozen yogurt has about 230; and a cup of Greek yogurt has just 130, plus twice the protein, so you're less likely to return to the freezer for a second helping. 13. Put olive oil in a spray container instead of using it directly from the bottle. Each tablespoon of olive oil is 120 calories and 15g of fat. Use a mister instead of pouring it straight into the pan or onto a salad. This allows for portion control and will save you more than 100 calories. 14. When baking, substitute canned pumpkin for butter or oil. Canned pumpkin-not pumpkin pie mix-is loaded with Vitamin A, which is important for skin and eye health, as well as immunity. And the comparisons are pretty crazy:  cup of canned pumpkin has about 40 calories, compared to butter or oil, which has more than 800 calories. Yes, 800 calories. Applesauce and mashed banana can also serve as good substitutions for butter or oil, usually in a 1:1 ratio. 15. Top casseroles with high-fiber cereal instead of breadcrumbs. Breadcrumbs are typically made with white bread, while breakfast  cereals contain 5-9g of fiber  per serving. Not only will you save more than 150 calories per  cup serving, the swap will also keep you more full and you'll get a metabolism boost from the added fiber. 16. Snack on pistachios instead of macadamia nuts. Believe it or not, you get the same amount of calories from 35 pistachios (100 calories) as you would from only five macadamia nuts. 17. Chow down on kale chips rather than potato chips. This is my favorite 'don't knock it 'till you try it' swap. Kale chips are so easy to make at home, and you can spice them up with a little grated parmesan or chili powder. Plus, they're a mere fraction of the calories of potato chips, but with the same crunch factor we crave so often. 18. Add seltzer and some fruit slices to your cocktail instead of soda or fruit juice. One cup of soda or fruit juice can pack on as much as 140 calories. Instead, use seltzer and fruit slices. The fruit provides valuable phytochemicals, such as flavonoids and anthocyanins, which help to combat cancer and stave off the aging process.

## 2014-10-06 NOTE — Progress Notes (Signed)
Assessment and Plan:  Hypertension: Cut lasix in half,  monitor blood  pressure at home. Continue DASH diet.  Reminder to go to the ER if any CP, SOB, nausea, dizziness, severe HA, changes vision/speech, left arm numbness and tingling, and jaw pain. Cholesterol: Continue diet and exercise. Check cholesterol.  diabetes-Continue diet and exercise. Check A1C Vitamin D Def- check level and continue medications.  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   Continue diet and meds as discussed. Further disposition pending results of labs.  HPI 59 y.o. female  presents for 3 month follow up with hypertension, hyperlipidemia, prediabetes and vitamin D.  Her blood pressure has been controlled at home, today their BP is BP: 118/70 mmHg  She does workout. She has a fit bit for christmas and has been trying to get 10,000 steps a day.  She denies chest pain, shortness of breath, dizziness.  She is not on cholesterol medication and denies myalgias. Her cholesterol is not at goal. The cholesterol last visit was:   Lab Results  Component Value Date   CHOL 179 06/20/2014   HDL 55 06/20/2014   LDLCALC 105* 06/20/2014   TRIG 96 06/20/2014   CHOLHDL 3.3 06/20/2014    She has been working on diet and exercise for prediabetes, and denies paresthesia of the feet, polydipsia, polyuria and visual disturbances. Last A1C in the office was:  Lab Results  Component Value Date   HGBA1C 6.3* 06/20/2014  Patient is on Vitamin D supplement.   Lab Results  Component Value Date   VD25OH 42 06/20/2014    BMI is Body mass index is 33.14 kg/(m^2)., she is working on diet and exercise. She has history of sleep apnea but is not able to tolerate the CPAP. 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 She has constant fatigue, snoring, HA, HTN, etc.  Wt Readings from Last 3 Encounters:  10/06/14 196 lb (88.905 kg)  06/20/14 195 lb (88.451 kg)  04/15/14 202 lb (91.627 kg)   She is on thyroid medication. Her medication was not changed last visit.   Lab Results  Component Value Date   TSH 1.815 06/20/2014    Current Medications:  Current Outpatient Prescriptions on File Prior to Visit  Medication Sig Dispense Refill  . albuterol (PROAIR HFA) 108 (90 BASE) MCG/ACT inhaler Inhale 2 puffs into the lungs every 6 (six) hours as needed for wheezing.    Marland Kitchen amitriptyline (ELAVIL) 10 MG tablet TAKE 1 TO 2 TABLETS BY MOUTH DAILY AT BEDTIME 60 tablet 2  . budesonide-formoterol (SYMBICORT) 80-4.5 MCG/ACT inhaler Inhale 2 puffs into the lungs 2 (two) times daily.    . chlorpheniramine (CHLOR-TRIMETON) 4 MG tablet Take 4 mg by mouth every 6 (six) hours as needed for allergies.    . Cyanocobalamin (VITAMIN B 12 PO) Take 1 tablet by mouth daily.    . cyclobenzaprine (FLEXERIL) 10 MG tablet TAKE 1 TO 2 TABLETS BY MOUTH DAILY AT NIGHT TIME AS NEEDED FOR HEADACHE AND JAW PAIN 60 tablet 1  . furosemide (LASIX) 40 MG tablet TAKE 1 TABLET  EVERY MORNING FOR BLOOD PRESSURE AND FLUID 90 tablet 0  . levothyroxine (SYNTHROID, LEVOTHROID) 75 MCG tablet TAKE 1 TABLET DAILY 90 tablet 0  . losartan (COZAAR) 100 MG tablet Take 1 tablet (100 mg total) by mouth daily. 90 tablet 4  . metFORMIN (GLUCOPHAGE-XR) 500 MG 24 hr tablet TAKE 1 TABLET BY MOUTH TWICE DAILY WITH FOOD 60 tablet 2  . Multiple Vitamins-Minerals (ZINC PO) Take 1 tablet by mouth daily.    . pantoprazole (PROTONIX) 40 MG tablet Take 1 tablet daily for acid indigestion & heartburn 30 tablet 0  . phentermine (ADIPEX-P) 37.5 MG tablet Take 1/2 to 1 tablet daily for dieting and weight loss 30 tablet 2  .  potassium chloride SA (K-DUR,KLOR-CON) 20 MEQ tablet TAKE 1 TABLET DAILY 90 tablet 1  . sertraline (ZOLOFT) 100 MG tablet Take 1-2 tablets daily for anxiety 180 tablet 3   No current facility-administered medications on file prior to visit.   Medical History:  Past Medical History  Diagnosis Date  . Hypertension   . GERD (gastroesophageal reflux disease)   . Deviated septum   . Anemia   . Asthma   . Anxiety   . Depression   . OSA (obstructive sleep apnea)   . Hyperlipemia   . Diabetes    Allergies:  Allergies  Allergen Reactions  . Doxycycline   . Levaquin [Levofloxacin In D5w]   . Prednisone      Review of Systems:  Review of Systems  Constitutional: Positive for malaise/fatigue. Negative for fever, chills, weight loss and diaphoresis.  HENT: Negative.   Eyes: Negative.   Respiratory: Negative.   Cardiovascular: Negative.   Gastrointestinal: Negative.   Genitourinary: Negative.   Musculoskeletal: Negative.   Skin: Negative.   Neurological: Positive for dizziness. Negative for tingling, tremors, sensory change, speech change, focal weakness, seizures, loss of consciousness and weakness.  Endo/Heme/Allergies: Negative.   Psychiatric/Behavioral: Negative.     Family history- Review and unchanged Social history- Review and unchanged Physical Exam: BP 118/70 mmHg  Pulse 100  Temp(Src) 98 F (36.7 C) (Temporal)  Resp 18  Ht 5' 4.5" (1.638 m)  Wt 196 lb (88.905 kg)  BMI 33.14 kg/m2 Wt Readings from Last 3 Encounters:  10/06/14 196 lb (88.905 kg)  06/20/14 195 lb (88.451 kg)  04/15/14 202 lb (91.627 kg)   General Appearance: Well nourished, in no apparent distress. Eyes: PERRLA, EOMs, conjunctiva no swelling or erythema Sinuses: No Frontal/maxillary tenderness ENT/Mouth: Ext aud canals clear, TMs without erythema, bulging. No erythema, swelling, or exudate on post pharynx.  Tonsils not swollen or erythematous. Hearing normal.  Neck: Supple, thyroid normal.   Respiratory: Respiratory effort normal, BS equal bilaterally without rales, rhonchi, wheezing or stridor.  Cardio: RRR with no MRGs. Brisk peripheral pulses without edema.  Abdomen: Soft, + BS,  Non tender, no guarding, rebound, hernias, masses. Lymphatics: Non tender without lymphadenopathy.  Musculoskeletal: Full ROM, 5/5 strength, Normal gait Skin: Warm, dry without rashes, lesions, ecchymosis.  Neuro: Cranial nerves intact. Normal muscle tone, no cerebellar symptoms. Psych: Awake and oriented X 3, normal affect, Insight and Judgment appropriate.    Vicie Mutters, PA-C 5:08 PM [facility]

## 2014-10-07 LAB — TSH: TSH: 1.87 u[IU]/mL (ref 0.350–4.500)

## 2014-10-07 LAB — VITAMIN D 25 HYDROXY (VIT D DEFICIENCY, FRACTURES): Vit D, 25-Hydroxy: 34 ng/mL (ref 30–100)

## 2014-10-07 LAB — HEMOGLOBIN A1C
Hgb A1c MFr Bld: 6.2 % — ABNORMAL HIGH (ref <5.7)
Mean Plasma Glucose: 131 mg/dL — ABNORMAL HIGH (ref <117)

## 2014-10-08 ENCOUNTER — Other Ambulatory Visit: Payer: Self-pay | Admitting: Physician Assistant

## 2014-10-11 ENCOUNTER — Other Ambulatory Visit: Payer: Self-pay | Admitting: Physician Assistant

## 2014-10-11 MED ORDER — PHENTERMINE HCL 37.5 MG PO TABS: ORAL_TABLET | ORAL | Status: DC

## 2014-10-19 ENCOUNTER — Other Ambulatory Visit: Payer: Self-pay | Admitting: Physician Assistant

## 2014-10-24 ENCOUNTER — Encounter: Payer: Self-pay | Admitting: Physician Assistant

## 2014-10-31 ENCOUNTER — Other Ambulatory Visit: Payer: Self-pay

## 2014-10-31 MED ORDER — AMITRIPTYLINE HCL 10 MG PO TABS: ORAL_TABLET | ORAL | Status: DC

## 2014-11-04 ENCOUNTER — Other Ambulatory Visit: Payer: Self-pay | Admitting: Physician Assistant

## 2014-11-14 ENCOUNTER — Other Ambulatory Visit: Payer: Self-pay | Admitting: Internal Medicine

## 2014-12-01 ENCOUNTER — Encounter: Payer: Self-pay | Admitting: Physician Assistant

## 2014-12-03 ENCOUNTER — Other Ambulatory Visit: Payer: Self-pay | Admitting: Internal Medicine

## 2014-12-03 ENCOUNTER — Other Ambulatory Visit: Payer: Self-pay | Admitting: Physician Assistant

## 2014-12-12 ENCOUNTER — Other Ambulatory Visit: Payer: Self-pay | Admitting: Internal Medicine

## 2015-01-10 ENCOUNTER — Other Ambulatory Visit: Payer: Self-pay | Admitting: Internal Medicine

## 2015-01-16 ENCOUNTER — Other Ambulatory Visit: Payer: Self-pay | Admitting: Internal Medicine

## 2015-01-25 ENCOUNTER — Encounter: Payer: Self-pay | Admitting: Physician Assistant

## 2015-01-25 ENCOUNTER — Ambulatory Visit (INDEPENDENT_AMBULATORY_CARE_PROVIDER_SITE_OTHER): Payer: 59 | Admitting: Physician Assistant

## 2015-01-25 VITALS — BP 122/78 | HR 88 | Temp 97.7°F | Resp 16 | Ht 64.5 in | Wt 195.0 lb

## 2015-01-25 DIAGNOSIS — E785 Hyperlipidemia, unspecified: Secondary | ICD-10-CM

## 2015-01-25 DIAGNOSIS — I10 Essential (primary) hypertension: Secondary | ICD-10-CM

## 2015-01-25 DIAGNOSIS — M5441 Lumbago with sciatica, right side: Secondary | ICD-10-CM

## 2015-01-25 DIAGNOSIS — E559 Vitamin D deficiency, unspecified: Secondary | ICD-10-CM

## 2015-01-25 DIAGNOSIS — Z79899 Other long term (current) drug therapy: Secondary | ICD-10-CM

## 2015-01-25 DIAGNOSIS — E669 Obesity, unspecified: Secondary | ICD-10-CM

## 2015-01-25 DIAGNOSIS — G4733 Obstructive sleep apnea (adult) (pediatric): Secondary | ICD-10-CM

## 2015-01-25 DIAGNOSIS — E119 Type 2 diabetes mellitus without complications: Secondary | ICD-10-CM

## 2015-01-25 DIAGNOSIS — N182 Chronic kidney disease, stage 2 (mild): Secondary | ICD-10-CM

## 2015-01-25 NOTE — Progress Notes (Signed)
Assessment and Plan:  Hypertension: monitor blood  pressure at home. Continue DASH diet.  Reminder to go to the ER if any CP, SOB, nausea, dizziness, severe HA, changes vision/speech, left arm numbness and tingling, and jaw pain. Cholesterol: Continue diet and exercise. Check cholesterol.  Diabetes-Continue diet and exercise. Check A1C Vitamin D Def- check level and continue medications.  Obesity with co morbidities- long discussion about weight loss, diet, and exercise OSA- not on CPAP, last study less than 5 years ago-  Patient is unable to tolerate CPAP mask or  nasal piece due to 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 She also has history of TMJ and I think she would truly benefit from a mouth piece.  Lower back pain- previous lumbar surgery- will refer to pain management for possible injection- does not want narcotics.   Continue diet and meds as discussed. Further disposition pending results of labs.  HPI 59 y.o. female  presents for 3 month follow up with hypertension, hyperlipidemia, prediabetes and vitamin D.  Her blood pressure has been controlled at home, today their BP is BP: 122/78 mmHg  She does workout, walks and wears her fitbit.  She denies chest pain, shortness of breath, dizziness. No more dizziness since cutting her lasix in half, will occ take an extra.   She is not on cholesterol medication and denies myalgias. Her cholesterol is not at goal. The cholesterol last visit was:   Lab Results  Component Value Date   CHOL 194 10/06/2014   HDL 65 10/06/2014   LDLCALC 99 10/06/2014   TRIG 150* 10/06/2014   CHOLHDL 3.0 10/06/2014    She has been working on diet and exercise for prediabetes, and denies paresthesia of the feet, polydipsia, polyuria and visual disturbances. Last A1C in the office was:  Lab Results  Component Value Date   HGBA1C 6.2* 10/06/2014  Patient is on Vitamin D supplement.   Lab Results   Component Value Date   VD25OH 34 10/06/2014    BMI is Body mass index is 32.97 kg/(m^2)., she is working on diet and exercise. She has been on phentermine for 6 months but she feels that it is not working as well anymore. She has history of sleep apnea but is not able to tolerate the CPAP. 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 She has constant fatigue, snoring, HA, HTN, etc.  Wt Readings from Last 3 Encounters:  01/25/15 195 lb (88.451 kg)  10/06/14 196 lb (88.905 kg)  06/20/14 195 lb (88.451 kg)   She is on thyroid medication. Her medication was not changed last visit.   Lab Results  Component Value Date   TSH 1.870 10/06/2014  She has history of lumbar back surgery over 17 years ago with fusion L4-L5, has right lower leg pain, and lower back pain. She has been using amitriptyline and would like to see pain management for possible injection.   Current Medications:  Current Outpatient Prescriptions on File Prior to Visit  Medication Sig Dispense Refill  . albuterol (PROAIR HFA) 108 (90 BASE) MCG/ACT inhaler Inhale 2 puffs into the lungs every 6 (six) hours as needed for wheezing.    Marland Kitchen amitriptyline (ELAVIL) 10 MG tablet TAKE 1 TO 2 TABLETS BY MOUTH DAILY AT BEDTIME 60 tablet 99  . budesonide-formoterol (  SYMBICORT) 80-4.5 MCG/ACT inhaler Inhale 2 puffs into the lungs 2 (two) times daily.    . chlorpheniramine (CHLOR-TRIMETON) 4 MG tablet Take 4 mg by mouth every 6 (six) hours as needed for allergies.    . Cyanocobalamin (VITAMIN B 12 PO) Take 1 tablet by mouth daily.    . cyclobenzaprine (FLEXERIL) 10 MG tablet TAKE 1 TO 2 TABLETS BY MOUTH DAILY AT NIGHT TIME AS NEEDED FOR HEADACHE AND JAW PAIN 60 tablet 1  . furosemide (LASIX) 40 MG tablet TAKE 1 TABLET EVERY MORNING FOR BLOOD PRESSURE AND FLUID 90 tablet 0  . levothyroxine (SYNTHROID, LEVOTHROID) 75 MCG tablet  TAKE 1 TABLET DAILY 90 tablet 3  . losartan (COZAAR) 100 MG tablet Take 1 tablet (100 mg total) by mouth daily. 90 tablet 4  . metFORMIN (GLUCOPHAGE-XR) 500 MG 24 hr tablet TAKE 1 TABLET BY MOUTH TWICE DAILY WITH FOOD 60 tablet 3  . Multiple Vitamins-Minerals (ZINC PO) Take 1 tablet by mouth daily.    . pantoprazole (PROTONIX) 40 MG tablet TAKE 1 TABLET DAILY 90 tablet 3  . phentermine (ADIPEX-P) 37.5 MG tablet Take 1/2 to 1 tablet daily for dieting and weight loss 30 tablet 2  . potassium chloride SA (K-DUR,KLOR-CON) 20 MEQ tablet TAKE 1 TABLET DAILY 90 tablet 1  . sertraline (ZOLOFT) 100 MG tablet Take 1-2 tablets daily for anxiety 180 tablet 3   No current facility-administered medications on file prior to visit.   Medical History:  Past Medical History  Diagnosis Date  . Hypertension   . GERD (gastroesophageal reflux disease)   . Deviated septum   . Anemia   . Asthma   . Anxiety   . Depression   . OSA (obstructive sleep apnea)   . Hyperlipemia   . Diabetes    Allergies:  Allergies  Allergen Reactions  . Doxycycline   . Levaquin [Levofloxacin In D5w]   . Prednisone     Review of Systems:  Review of Systems  Constitutional: Positive for malaise/fatigue. Negative for fever, chills, weight loss and diaphoresis.  HENT: Negative for congestion, ear discharge, ear pain, hearing loss, nosebleeds, sore throat and tinnitus.   Eyes: Negative.   Respiratory: Negative.  Negative for stridor.   Cardiovascular: Negative.   Gastrointestinal: Positive for constipation. Negative for heartburn, nausea, vomiting, abdominal pain, diarrhea, blood in stool and melena.  Genitourinary: Negative.   Musculoskeletal: Positive for back pain. Negative for myalgias, joint pain, falls and neck pain.  Skin: Negative.   Neurological: Positive for tingling (right leg) and headaches. Negative for dizziness, tremors, sensory change, speech change, focal weakness, seizures, loss of consciousness and  weakness.  Psychiatric/Behavioral: Positive for depression and memory loss. Negative for suicidal ideas, hallucinations and substance abuse. The patient has insomnia. The patient is not nervous/anxious.     Family history- Review and unchanged Social history- Review and unchanged Physical Exam: BP 122/78 mmHg  Pulse 88  Temp(Src) 97.7 F (36.5 C)  Resp 16  Ht 5' 4.5" (1.638 m)  Wt 195 lb (88.451 kg)  BMI 32.97 kg/m2 Wt Readings from Last 3 Encounters:  01/25/15 195 lb (88.451 kg)  10/06/14 196 lb (88.905 kg)  06/20/14 195 lb (88.451 kg)   General Appearance: Well nourished, in no apparent distress. Eyes: PERRLA, EOMs, conjunctiva no swelling or erythema Sinuses: No Frontal/maxillary tenderness ENT/Mouth: Ext aud canals clear, TMs without erythema, bulging. No erythema, swelling, or exudate on post pharynx.  Tonsils not swollen or erythematous. Hearing normal.  Neck: Supple, thyroid  normal.  Respiratory: Respiratory effort normal, BS equal bilaterally without rales, rhonchi, wheezing or stridor.  Cardio: RRR with no MRGs. Brisk peripheral pulses without edema.  Abdomen: Soft, + BS,  Non tender, no guarding, rebound, hernias, masses. Lymphatics: Non tender without lymphadenopathy.  Musculoskeletal: Full ROM, 5/5 strength, Normal gait, + right straight leg raise, good reflexes.  Skin: Port wine stain right face. Warm, dry without rashes, lesions, ecchymosis.  Neuro: Cranial nerves intact. Normal muscle tone, no cerebellar symptoms. Psych: Awake and oriented X 3, normal affect, Insight and Judgment appropriate.    Vicie Mutters, PA-C 4:56 PM [facility]

## 2015-01-25 NOTE — Patient Instructions (Addendum)
Can call Dr. Toy Cookey to get evaluated for dental sleep appliance. # [phone]  OR you can try Dr. Clearence Ped in [facility] # [phone]  We will send notes. Call and get price quote on both.    Before you even begin to attack a weight-loss plan, it pays to remember this: You are not fat. You have fat. Losing weight isn't about blame or shame; it's simply another achievement to accomplish. Dieting is like any other skill-you have to buckle down and work at it. As long as you act in a smart, reasonable way, you'll ultimately get where you want to be. Here are some weight loss pearls for you.  1. It's Not a Diet. It's a Lifestyle Thinking of a diet as something you're on and suffering through only for the short term doesn't work. To shed weight and keep it off, you need to make permanent changes to the way you eat. It's OK to indulge occasionally, of course, but if you cut calories temporarily and then revert to your old way of eating, you'll gain back the weight quicker than you can say yo-yo. Use it to lose it. Research shows that one of the best predictors of long-term weight loss is how many pounds you drop in the first month. For that reason, nutritionists often suggest being stricter for the first two weeks of your new eating strategy to build momentum. Cut out added sugar and alcohol and avoid unrefined carbs. After that, figure out how you can reincorporate them in a way that's healthy and maintainable.  2. There's a Right Way to Exercise Working out burns calories and fat and boosts your metabolism by building muscle. But those trying to lose weight are notorious for overestimating the number of calories they burn and underestimating the amount they take in. Unfortunately, your system is biologically programmed to hold on to extra pounds and that means when you start exercising, your body senses the deficit and ramps up its hunger signals. If you're not diligent, you'll eat  everything you burn and then some. Use it to lose it. Cardio gets all the exercise glory, but strength and interval training are the real heroes. They help you build lean muscle, which in turn increases your metabolism and calorie-burning ability 3. Don't Overreact to Mild Hunger Some people have a hard time losing weight because of hunger anxiety. To them, being hungry is bad-something to be avoided at all costs-so they carry snacks with them and eat when they don't need to. Others eat because they're stressed out or bored. While you never want to get to the point of being ravenous (that's when bingeing is likely to happen), a hunger pang, a craving, or the fact that it's 3:00 p.m. should not send you racing for the vending machine or obsessing about the energy bar in your purse. Ideally, you should put off eating until your stomach is growling and it's difficult to concentrate.  Use it to lose it. When you feel the urge to eat, use the HALT method. Ask yourself, Am I really hungry? Or am I angry or anxious, lonely or bored, or tired? If you're still not certain, try the apple test. If you're truly hungry, an apple should seem delicious; if it doesn't, something else is going on. Or you can try drinking water and making yourself busy, if you are still hungry try a healthy snack.  4. Not All Calories Are Created Equal The mechanics of weight  loss are pretty simple: Take in fewer calories than you use for energy. But the kind of food you eat makes all the difference. Processed food that's high in saturated fat and refined starch or sugar can cause inflammation that disrupts the hormone signals that tell your brain you're full. The result: You eat a lot more.  Use it to lose it. Clean up your diet. Swap in whole, unprocessed foods, including vegetables, lean protein, and healthy fats that will fill you up and give you the biggest nutritional bang for your calorie buck. In a few weeks, as your brain starts  receiving regular hunger and fullness signals once again, you'll notice that you feel less hungry overall and naturally start cutting back on the amount you eat.  5. Protein, Produce, and Plant-Based Fats Are Your Weight-Loss Trinity Here's why eating the three Ps regularly will help you drop pounds. Protein fills you up. You need it to build lean muscle, which keeps your metabolism humming so that you can torch more fat. People in a weight-loss program who ate double the recommended daily allowance for protein (about 110 grams for a 150-pound woman) lost 70 percent of their weight from fat, while people who ate the RDA lost only about 40 percent, one study found. Produce is packed with filling fiber. "It's very difficult to consume too many calories if you're eating a lot of vegetables. Example: Three cups of broccoli is a lot of food, yet only 93 calories. (Fruit is another story. It can be easy to overeat and can contain a lot of calories from sugar, so be sure to monitor your intake.) Plant-based fats like olive oil and those in avocados and nuts are healthy and extra satiating.  Use it to lose it. Aim to incorporate each of the three Ps into every meal and snack. People who eat protein throughout the day are able to keep weight off, according to a study in the [place] of Clinical Nutrition. In addition to meat, poultry and seafood, good sources are beans, lentils, eggs, tofu, and yogurt. As for fat, keep portion sizes in check by measuring out salad dressing, oil, and nut butters (shoot for one to two tablespoons). Finally, eat veggies or a little fruit at every meal. People who did that consumed 308 fewer calories but didn't feel any hungrier than when they didn't eat more produce.  7. How You Eat Is As Important As What You Eat In order for your brain to register that you're full, you need to focus on what you're eating. Sit down whenever you eat, preferably at a table. Turn off the TV or  computer, put down your phone, and look at your food. Smell it. Chew slowly, and don't put another bite on your fork until you swallow. When women ate lunch this attentively, they consumed 30 percent less when snacking later than those who listened to an audiobook at lunchtime, according to a study in the [place] of Nutrition. 8. Weighing Yourself Really Works The scale provides the best evidence about whether your efforts are paying off. Seeing the numbers tick up or down or stagnate is motivation to keep going-or to rethink your approach. A 2015 study at [facility] found that daily weigh-ins helped people lose more weight, keep it off, and maintain that loss, even after two years. Use it to lose it. Step on the scale at the same time every day for the best results. If your weight shoots up several pounds from  one weigh-in to the next, don't freak out. Eating a lot of salt the night before or having your period is the likely culprit. The number should return to normal in a day or two. It's a steady climb that you need to do something about. 9. Too Much Stress and Too Little Sleep Are Your Enemies When you're tired and frazzled, your body cranks up the production of cortisol, the stress hormone that can cause carb cravings. Not getting enough sleep also boosts your levels of ghrelin, a hormone associated with hunger, while suppressing leptin, a hormone that signals fullness and satiety. People on a diet who slept only five and a half hours a night for two weeks lost 55 percent less fat and were hungrier than those who slept eight and a half hours, according to a study in the [place]. Use it to lose it. Prioritize sleep, aiming for seven hours or more a night, which research shows helps lower stress. And make sure you're getting quality zzz's. If a snoring spouse or a fidgety cat wakes you up frequently throughout the night, you may end up getting the equivalent of  just four hours of sleep, according to a study from [facility]. Keep pets out of the bedroom, and use a white-noise app to drown out snoring. 10. You Will Hit a plateau-And You Can Bust Through It As you slim down, your body releases much less leptin, the fullness hormone.  If you're not strength training, start right now. Building muscle can raise your metabolism to help you overcome a plateau. To keep your body challenged and burning calories, incorporate new moves and more intense intervals into your workouts or add another sweat session to your weekly routine. Alternatively, cut an extra 100 calories or so a day from your diet. Now that you've lost weight, your body simply doesn't need as much fuel.   Ways to cut 100 calories  1. Eat your eggs with hot sauce OR salsa instead of cheese.  Eggs are great for breakfast, but many people consider eggs and cheese to be BFFs. Instead of cheese-1 oz. of cheddar has 114 calories-top your eggs with hot sauce, which contains no calories and helps with satiety and metabolism. Salsa is also a great option!!  2. Top your toast, waffles or pancakes with mashed berries instead of jelly or syrup. Half a cup of berries-fresh, frozen or thawed-has about 40 calories, compared with 2 tbsp. of maple syrup or jelly, which both have about 100 calories. The berries will also give you a good punch of fiber, which helps keep you full and satisfied and won't spike blood sugar quickly like the jelly or syrup. 3. Swap the non-fat latte for black coffee with a splash of half-and-half. Contrary to its name, that non-fat latte has 130 calories and a startling 19g of carbohydrates per 16 oz. serving. Replacing that 'light' drinkable dessert with a black coffee with a splash of half-and-half saves you more than 100 calories per 16 oz. serving. 4. Sprinkle salads with freeze-dried raspberries instead of dried cranberries. If you want a sweet addition to your nutritious salad,  stay away from dried cranberries. They have a whopping 130 calories per  cup and 30g carbohydrates. Instead, sprinkle freeze-dried raspberries guilt-free and save more than 100 calories per  cup serving, adding 3g of belly-filling fiber. 5. Go for mustard in place of mayo on your sandwich. Mustard can add really nice flavor to any sandwich, and there are  tons of varieties, from spicy to honey. A serving of mayo is 95 calories, versus 10 calories in a serving of mustard. 6. Choose a DIY salad dressing instead of the store-bought kind. Mix Dijon or whole grain mustard with low-fat Kefir or red wine vinegar and garlic. 7. Use hummus as a spread instead of a dip. Use hummus as a spread on a high-fiber cracker or tortilla with a sandwich and save on calories without sacrificing taste. 8. Pick just one salad "accessory." Salad isn't automatically a calorie winner. It's easy to over-accessorize with toppings. Instead of topping your salad with nuts, avocado and cranberries (all three will clock in at 313 calories), just pick one. The next day, choose a different accessory, which will also keep your salad interesting. You don't wear all your jewelry every day, right? 9. Ditch the white pasta in favor of spaghetti squash. One cup of cooked spaghetti squash has about 40 calories, compared with traditional spaghetti, which comes with more than 200. Spaghetti squash is also nutrient-dense. It's a good source of fiber and Vitamins A and C, and it can be eaten just like you would eat pasta-with a great tomato sauce and [place] meatballs or with pesto, tofu and spinach, for example. 10. Dress up your chili, soups and stews with non-fat [place] yogurt instead of sour cream. Just a 'dollop' of sour cream can set you back 115 calories and a whopping 12g of fat-seven of which are of the artery-clogging variety. Added bonus: [place] yogurt is packed with muscle-building protein, calcium and B Vitamins. 11. Mash cauliflower  instead of mashed potatoes. One cup of traditional mashed potatoes-in all their creamy goodness-has more than 200 calories, compared to mashed cauliflower, which you can typically eat for less than 100 calories per 1 cup serving. Cauliflower is a great source of the antioxidant indole-3-carbinol (I3C), which may help reduce the risk of some cancers, like breast cancer. 12. Ditch the ice cream sundae in favor of a [place] yogurt parfait. Instead of a cup of ice cream or fro-yo for dessert, try 1 cup of nonfat Greek yogurt topped with fresh berries and a sprinkle of cacao nibs. Both toppings are packed with antioxidants, which can help reduce cellular inflammation and oxidative damage. And the comparison is a no-brainer: One cup of ice cream has about 275 calories; one cup of frozen yogurt has about 230; and a cup of Greek yogurt has just 130, plus twice the protein, so you're less likely to return to the freezer for a second helping. 13. Put olive oil in a spray container instead of using it directly from the bottle. Each tablespoon of olive oil is 120 calories and 15g of fat. Use a mister instead of pouring it straight into the pan or onto a salad. This allows for portion control and will save you more than 100 calories. 14. When baking, substitute canned pumpkin for butter or oil. Canned pumpkin-not pumpkin pie mix-is loaded with Vitamin A, which is important for skin and eye health, as well as immunity. And the comparisons are pretty crazy:  cup of canned pumpkin has about 40 calories, compared to butter or oil, which has more than 800 calories. Yes, 800 calories. Applesauce and mashed banana can also serve as good substitutions for butter or oil, usually in a 1:1 ratio. 15. Top casseroles with high-fiber cereal instead of breadcrumbs. Breadcrumbs are typically made with white bread, while breakfast cereals contain 5-9g of fiber per serving. Not only will you save more  than 150 calories per  cup serving,  the swap will also keep you more full and you'll get a metabolism boost from the added fiber. 16. Snack on pistachios instead of macadamia nuts. Believe it or not, you get the same amount of calories from 35 pistachios (100 calories) as you would from only five macadamia nuts. 17. Chow down on kale chips rather than potato chips. This is my favorite 'don't knock it 'till you try it' swap. Kale chips are so easy to make at home, and you can spice them up with a little grated parmesan or chili powder. Plus, they're a mere fraction of the calories of potato chips, but with the same crunch factor we crave so often. 18. Add seltzer and some fruit slices to your cocktail instead of soda or fruit juice. One cup of soda or fruit juice can pack on as much as 140 calories. Instead, use seltzer and fruit slices. The fruit provides valuable phytochemicals, such as flavonoids and anthocyanins, which help to combat cancer and stave off the aging process.

## 2015-01-26 LAB — CBC WITH DIFFERENTIAL/PLATELET
Basophils Absolute: 0 10*3/uL (ref 0.0–0.1)
Basophils Relative: 0 % (ref 0–1)
Eosinophils Absolute: 0.4 10*3/uL (ref 0.0–0.7)
Eosinophils Relative: 4 % (ref 0–5)
HCT: 39.4 % (ref 36.0–46.0)
Hemoglobin: 13.1 g/dL (ref 12.0–15.0)
Lymphocytes Relative: 29 % (ref 12–46)
Lymphs Abs: 3.2 10*3/uL (ref 0.7–4.0)
MCH: 27.1 pg (ref 26.0–34.0)
MCHC: 33.2 g/dL (ref 30.0–36.0)
MCV: 81.6 fL (ref 78.0–100.0)
MPV: 9.4 fL (ref 8.6–12.4)
Monocytes Absolute: 0.6 10*3/uL (ref 0.1–1.0)
Monocytes Relative: 5 % (ref 3–12)
Neutro Abs: 6.9 10*3/uL (ref 1.7–7.7)
Neutrophils Relative %: 62 % (ref 43–77)
Platelets: 311 10*3/uL (ref 150–400)
RBC: 4.83 MIL/uL (ref 3.87–5.11)
RDW: 15.5 % (ref 11.5–15.5)
WBC: 11.1 10*3/uL — ABNORMAL HIGH (ref 4.0–10.5)

## 2015-01-26 LAB — TSH: TSH: 1.593 u[IU]/mL (ref 0.350–4.500)

## 2015-01-26 LAB — BASIC METABOLIC PANEL WITH GFR
BUN: 18 mg/dL (ref 6–23)
CO2: 26 mEq/L (ref 19–32)
Calcium: 9.3 mg/dL (ref 8.4–10.5)
Chloride: 98 mEq/L (ref 96–112)
Creat: 1.09 mg/dL (ref 0.50–1.10)
GFR, Est African American: 65 mL/min
GFR, Est Non African American: 56 mL/min — ABNORMAL LOW
Glucose, Bld: 95 mg/dL (ref 70–99)
Potassium: 3.9 mEq/L (ref 3.5–5.3)
Sodium: 138 mEq/L (ref 135–145)

## 2015-01-26 LAB — LIPID PANEL
Cholesterol: 207 mg/dL — ABNORMAL HIGH (ref 0–200)
HDL: 65 mg/dL (ref >=46)
LDL Cholesterol: 115 mg/dL — ABNORMAL HIGH (ref 0–99)
Total CHOL/HDL Ratio: 3.2 Ratio
Triglycerides: 136 mg/dL (ref <150)
VLDL: 27 mg/dL (ref 0–40)

## 2015-01-26 LAB — VITAMIN D 25 HYDROXY (VIT D DEFICIENCY, FRACTURES): Vit D, 25-Hydroxy: 30 ng/mL (ref 30–100)

## 2015-01-26 LAB — INSULIN, FASTING: Insulin fasting, serum: 6.4 u[IU]/mL (ref 2.0–19.6)

## 2015-01-26 LAB — HEMOGLOBIN A1C
Hgb A1c MFr Bld: 6.1 % — ABNORMAL HIGH (ref <5.7)
Mean Plasma Glucose: 128 mg/dL — ABNORMAL HIGH (ref <117)

## 2015-01-26 LAB — MAGNESIUM: Magnesium: 1.8 mg/dL (ref 1.5–2.5)

## 2015-01-26 LAB — HEPATIC FUNCTION PANEL
ALT: 30 U/L (ref 0–35)
AST: 29 U/L (ref 0–37)
Albumin: 4.2 g/dL (ref 3.5–5.2)
Alkaline Phosphatase: 80 U/L (ref 39–117)
Bilirubin, Direct: 0.1 mg/dL (ref 0.0–0.3)
Indirect Bilirubin: 0.2 mg/dL (ref 0.2–1.2)
Total Bilirubin: 0.3 mg/dL (ref 0.2–1.2)
Total Protein: 7.3 g/dL (ref 6.0–8.3)

## 2015-01-27 ENCOUNTER — Encounter: Payer: Self-pay | Admitting: Physician Assistant

## 2015-01-30 ENCOUNTER — Ambulatory Visit (INDEPENDENT_AMBULATORY_CARE_PROVIDER_SITE_OTHER): Payer: 59 | Admitting: *Deleted

## 2015-01-30 DIAGNOSIS — R300 Dysuria: Secondary | ICD-10-CM

## 2015-01-30 DIAGNOSIS — R899 Unspecified abnormal finding in specimens from other organs, systems and tissues: Secondary | ICD-10-CM

## 2015-01-31 ENCOUNTER — Encounter: Payer: Self-pay | Admitting: Internal Medicine

## 2015-01-31 LAB — URINALYSIS, ROUTINE W REFLEX MICROSCOPIC
Bilirubin Urine: NEGATIVE
Glucose, UA: NEGATIVE mg/dL
Hgb urine dipstick: NEGATIVE
Ketones, ur: NEGATIVE mg/dL
Leukocytes, UA: NEGATIVE
Nitrite: NEGATIVE
Protein, ur: NEGATIVE mg/dL
Specific Gravity, Urine: <1.005 — ABNORMAL LOW (ref 1.005–1.030)
Urobilinogen, UA: 0.2 mg/dL (ref 0.0–1.0)
pH: 7 (ref 5.0–8.0)

## 2015-02-01 LAB — URINE CULTURE

## 2015-02-10 ENCOUNTER — Other Ambulatory Visit: Payer: Self-pay | Admitting: *Deleted

## 2015-02-10 MED ORDER — CYCLOBENZAPRINE HCL 10 MG PO TABS: ORAL_TABLET | ORAL | Status: DC

## 2015-02-20 ENCOUNTER — Other Ambulatory Visit: Payer: Self-pay

## 2015-02-22 ENCOUNTER — Telehealth: Payer: Self-pay | Admitting: Neurology

## 2015-02-22 NOTE — Telephone Encounter (Signed)
Patient states that she had a sleep study with Dr. Brett Fairy and wants to talk to her about a mouth piece her PCP is trying to get for her.  She says her husband says she stops breathing at night while sleeping.  I looked in the system and can't see where she has had an appt with Dohmeier.  Please contact patient with needed information.

## 2015-02-23 ENCOUNTER — Other Ambulatory Visit: Payer: Self-pay | Admitting: Pain Medicine

## 2015-02-23 ENCOUNTER — Ambulatory Visit
Admission: RE | Admit: 2015-02-23 | Discharge: 2015-02-23 | Disposition: A | Discharge status: Home / Self Care | Payer: 59 | Source: Ambulatory Visit | Attending: Pain Medicine | Admitting: Pain Medicine

## 2015-02-23 DIAGNOSIS — M545 Low back pain: Secondary | ICD-10-CM

## 2015-02-23 NOTE — Telephone Encounter (Signed)
Returned pt's phone call. Pt has not been seen since 2012 and will need to come in for a visit with Dr. Brett Fairy for an oral appliance. Pt did not answer, left a message asking her to call me back.

## 2015-02-23 NOTE — Telephone Encounter (Signed)
Spoke to pt, she is asking Dr. Edwena Felty thoughts on a dental device. Her husband is concerned about the dental device however because of her witnessed apneas. She wants to make an appt with Dr. Brett Fairy to discuss. She is not using her cpap any longer because of facial irritation. Made an apt 8/3 at 1:00. Pt verbalized understanding to arrive 15 minutes early.

## 2015-02-27 ENCOUNTER — Encounter: Payer: Self-pay | Admitting: *Deleted

## 2015-02-28 ENCOUNTER — Other Ambulatory Visit: Payer: Self-pay

## 2015-03-13 ENCOUNTER — Other Ambulatory Visit: Payer: 59

## 2015-03-13 DIAGNOSIS — N182 Chronic kidney disease, stage 2 (mild): Secondary | ICD-10-CM

## 2015-03-13 DIAGNOSIS — R300 Dysuria: Secondary | ICD-10-CM

## 2015-03-13 LAB — BASIC METABOLIC PANEL WITH GFR
BUN: 16 mg/dL (ref 6–23)
CO2: 26 mEq/L (ref 19–32)
Calcium: 9.1 mg/dL (ref 8.4–10.5)
Chloride: 103 mEq/L (ref 96–112)
Creat: 0.82 mg/dL (ref 0.50–1.10)
GFR, Est African American: >89 mL/min
GFR, Est Non African American: 80 mL/min
Glucose, Bld: 140 mg/dL — ABNORMAL HIGH (ref 70–99)
Potassium: 3.9 mEq/L (ref 3.5–5.3)
Sodium: 142 mEq/L (ref 135–145)

## 2015-03-14 LAB — URINE CULTURE
Colony Count: NO GROWTH
Organism ID, Bacteria: NO GROWTH

## 2015-03-14 LAB — URINALYSIS, ROUTINE W REFLEX MICROSCOPIC
Bilirubin Urine: NEGATIVE
Glucose, UA: NEGATIVE mg/dL
Hgb urine dipstick: NEGATIVE
Ketones, ur: NEGATIVE mg/dL
Leukocytes, UA: NEGATIVE
Nitrite: NEGATIVE
Protein, ur: NEGATIVE mg/dL
Specific Gravity, Urine: 1.006 (ref 1.005–1.030)
Urobilinogen, UA: 0.2 mg/dL (ref 0.0–1.0)
pH: 6 (ref 5.0–8.0)

## 2015-03-17 ENCOUNTER — Other Ambulatory Visit: Payer: Self-pay | Admitting: Physician Assistant

## 2015-03-28 ENCOUNTER — Other Ambulatory Visit: Payer: Self-pay | Admitting: Physician Assistant

## 2015-03-28 ENCOUNTER — Telehealth: Payer: Self-pay

## 2015-03-28 DIAGNOSIS — E669 Obesity, unspecified: Secondary | ICD-10-CM

## 2015-03-28 DIAGNOSIS — G4733 Obstructive sleep apnea (adult) (pediatric): Secondary | ICD-10-CM

## 2015-03-28 NOTE — Telephone Encounter (Signed)
Pt hasn't been seen since 2012. Needs a referral from her PCP to be seen tomorrow. I called and advised pt of this. She said she would call her PCP, Vicie Mutters, PA, and get a referral in to [place] and call back. If she cannot get the referral done today, she will need to call us back and reschedule her appt. Pt verbalized understanding.

## 2015-03-29 ENCOUNTER — Ambulatory Visit (INDEPENDENT_AMBULATORY_CARE_PROVIDER_SITE_OTHER): Payer: 59 | Admitting: Neurology

## 2015-03-29 ENCOUNTER — Encounter: Payer: Self-pay | Admitting: Neurology

## 2015-03-29 VITALS — BP 124/78 | HR 88 | Resp 20 | Ht 63.5 in | Wt 208.0 lb

## 2015-03-29 DIAGNOSIS — G4710 Hypersomnia, unspecified: Secondary | ICD-10-CM

## 2015-03-29 DIAGNOSIS — R51 Headache: Secondary | ICD-10-CM

## 2015-03-29 DIAGNOSIS — G4733 Obstructive sleep apnea (adult) (pediatric): Secondary | ICD-10-CM

## 2015-03-29 DIAGNOSIS — Q8589 Other phakomatoses, not elsewhere classified: Secondary | ICD-10-CM

## 2015-03-29 DIAGNOSIS — R519 Headache, unspecified: Secondary | ICD-10-CM

## 2015-03-29 DIAGNOSIS — Z6839 Body mass index (BMI) 39.0-39.9, adult: Secondary | ICD-10-CM | POA: Insufficient documentation

## 2015-03-29 DIAGNOSIS — G4730 Sleep apnea, unspecified: Secondary | ICD-10-CM | POA: Diagnosis not present

## 2015-03-29 DIAGNOSIS — Q858 Other phakomatoses, not elsewhere classified: Secondary | ICD-10-CM

## 2015-03-29 NOTE — Progress Notes (Signed)
SLEEP MED[facility]INE CLINIC   Provider:  Larey Seat, M D  Referring Provider: Unk Pinto, MD Primary Care Physician:  Alesia Richards, MD  Chief Complaint  Patient presents with  . Other    pt has stopped cpap, wants a dental device, rm 10, alone    HPI:  Kristina Webb is a 59 y.o. female ,  seen here as a referral   from Dr. Melford Aase for a new sleep evaluation.  She is a former Dr Erling Cruz and Dr. Estella Husk patient with [name].  Mrs. Roeder was referred by Dr. 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 Drema Balzarine , when he worked her up for headaches and neck pain. The patient also reported now return of her frequent headaches. She was referred for witnessed snoring and arousals from snoring and had endorsed the Epworth sleepiness score at the time at 10 points. Her BMI was 39. She was diagnosed with an AHI of 20.8 RDI 24.5 oxygen nadir was 78% the lowest in supine REM sleep. In frequent periodic limb movements were noted. The patient return for CPAP titration on 04-17-11 , and the choice of interface was a great challenge due to [employer] condition. She was titrated to 13 cm water and a comfort gel medium size full facemask was used. I had instructed the technician not to use a full face mask and be changed later to a nasal pillow after our visit. Mrs. Romanoff reports that due to the [employer] there was intolerance to  any facial interface- she stopped using her CPAP about a year ago, she saw Dr. Augustina Mood DDS , who explained to her that only only mild apnea without significant oxygen desaturation is usually applicable to the dental treatment.  Her husband has reported witnessing apneas on a very frequent basis and hears her snoring loudly, too.  Her Epworth sleepiness score today is endorsed  at 5 points and her fatigue severity at 43 points. Fatigue definitely is higher than normal. She also reports that she notice a difference since not using CPAP she felt better refreshed had no longer nocturia when she was on CPAP and had less headaches and neck pain. All the symptoms have returned since her discontinuation of the therapy. I still think 4 years ago her residual AHI was 5.3.   Please note that the patient had CSF testing which was negative for IgG and oligoclonal, on 06-13-2006.  She also had an MRI study of the brain and cervical spine on 12-13-10 and previously on 05-08-2006.   Review of Systems: Out of a complete 14 system review, the patient complains of only the following symptoms, and all other reviewed systems are negative. OSA witnessed, sneong andf apnea, no seizures, no epilepsy, no ataxia. No glaucoma with sturge weber.!.   Epworth score 5  , Fatigue severity score 43. Dr Vira Blanco is her pain management doctor.     History   Social History  . Marital Status: Married    Spouse Name: N/A  . Number of Children: 0  . Years of Education: N/A   Occupational History  . merchandise services    Social History Main Topics  . Smoking status: Never Smoker   . Smokeless tobacco: Never Used  . Alcohol Use: No  . Drug Use:  No  . Sexual Activity: Not on file   Other Topics Concern  . Not on file   Social History Narrative   Drinks about 1/2 can soda daily.    Family History  Problem Relation Age of Onset  . Emphysema Mother     smoked  . Allergies Mother   . Asthma Mother   . Heart disease Mother   . Breast cancer Mother     with mets to Bone  . Prostate cancer Father   . Brain cancer Father     Past Medical History  Diagnosis Date  . Hypertension   . GERD (gastroesophageal reflux disease)   . Deviated septum   . Anemia   . Asthma   . Anxiety   . Depression   . OSA (obstructive sleep apnea)   . Hyperlipemia   . Diabetes     Past Surgical  History  Procedure Laterality Date  . Back surgery      Current Outpatient Prescriptions  Medication Sig Dispense Refill  . albuterol (PROAIR HFA) 108 (90 BASE) MCG/ACT inhaler Inhale 2 puffs into the lungs every 6 (six) hours as needed for wheezing.    Marland Kitchen amitriptyline (ELAVIL) 10 MG tablet TAKE 1 TO 2 TABLETS BY MOUTH DAILY AT BEDTIME 60 tablet 99  . budesonide-formoterol (SYMBICORT) 80-4.5 MCG/ACT inhaler Inhale 2 puffs into the lungs 2 (two) times daily.    . chlorpheniramine (CHLOR-TRIMETON) 4 MG tablet Take 4 mg by mouth every 6 (six) hours as needed for allergies.    . Cyanocobalamin (VITAMIN B 12 PO) Take 1 tablet by mouth daily.    . cyclobenzaprine (FLEXERIL) 10 MG tablet TAKE 1 TO 2 TABLETS BY MOUTH DAILY AT NIGHT TIME AS NEEDED FOR HEADACHE AND JAW PAIN 60 tablet 1  . furosemide (LASIX) 40 MG tablet TAKE 1 TABLET EVERY MORNING FOR BLOOD PRESSURE AND FLUID 90 tablet 1  . levothyroxine (SYNTHROID, LEVOTHROID) 75 MCG tablet TAKE 1 TABLET DAILY 90 tablet 3  . losartan (COZAAR) 100 MG tablet Take 1 tablet (100 mg total) by mouth daily. 90 tablet 4  . metFORMIN (GLUCOPHAGE-XR) 500 MG 24 hr tablet TAKE 1 TABLET BY MOUTH TWICE DAILY WITH FOOD 60 tablet 3  . Multiple Vitamins-Minerals (ZINC PO) Take 1 tablet by mouth daily.    . NUCYNTA 50 MG TABS tablet TAKE 1 TABLET 4 TIMES A DAY  0  . pantoprazole (PROTONIX) 40 MG tablet TAKE 1 TABLET DAILY 90 tablet 3  . potassium chloride SA (K-DUR,KLOR-CON) 20 MEQ tablet TAKE 1 TABLET DAILY 90 tablet 1  . sertraline (ZOLOFT) 100 MG tablet Take 1-2 tablets daily for anxiety 180 tablet 3  . phentermine (ADIPEX-P) 37.5 MG tablet Take 1/2 to 1 tablet daily for dieting and weight loss (Patient not taking: Reported on 03/29/2015) 30 tablet 2   No current facility-administered medications for this visit.    Allergies as of 03/29/2015 - Review Complete 03/29/2015  Allergen Reaction Noted  . Doxycycline  09/19/2013  . Levaquin [levofloxacin in d5w]   09/19/2013  . Prednisone  09/19/2013    Vitals: BP 124/78 mmHg  Pulse 88  Resp 20  Ht 5' 3.5" (1.613 m)  Wt 208 lb (94.348 kg)  BMI 36.26 kg/m2 Last Weight:  Wt Readings from Last 1 Encounters:  03/29/15 208 lb (94.348 kg)       Last Height:   Ht Readings from Last 1 Encounters:  03/29/15 5' 3.5" (1.613 m)    Physical exam:  General:  The patient is awake, alert and appears not in acute distress. The patient is well groomed. Head: Normocephalic, atraumatic. Neck is supple. Mallampati 3   neck circumference: 15.75. Nasal airflow unrestricted  TMJ is evident . Retrognathia is seen.  Cardiovascular:  Regular rate and rhythm , without  murmurs or carotid bruit, and without distended neck veins. Respiratory: Lungs are clear to auscultation. Skin:  Sturge weber V 2 right,  Trunk: BMI is  elevated and patient  has normal posture.  Neurologic exam : The patient is awake and alert, oriented to place and time.   Memory subjective described as intact. There is a normal attention span & concentration ability.  Speech is fluent without  dysarthria, dysphonia or aphasia. Mood and affect are appropriate.  Cranial nerves: Pupils are equal and briskly reactive to light. Funduscopic exam without evidence of pallor or edema.  Extraocular movements  in vertical and horizontal planes intact and without nystagmus. Visual fields by finger perimetry are intact. Hearing to finger rub intact.  Facial sensation intact to fine touch. Facial motor strength is symmetric and tongue and uvula move midline.  The patients right upper lip appears swollen ,and the eye lid rim is irregular form sturge weber.  Motor exam:   Normal tone ,muscle bulk and symmetric ,strength in all extremities.  Sensory:  Fine touch, pinprick and vibration were tested in all extremities. Proprioception is normal.  Coordination: Rapid alternating movements in the fingers/hands is normal. Finger-to-nose maneuver normal without  evidence of ataxia, dysmetria or tremor.  Gait and station: Patient walks without assistive device and is able unassisted to climb up to the exam table. Strength within normal limits. Stance is stable and normal.  Tandem gait is unsteady- she has no trouble with heel and toe stand. Romberg testing is negative.  Deep tendon reflexes: in the  upper and lower extremities are symmetric and intact. Babinski maneuver response is downgoing.   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 Mattie Marlin plastic surgery office that have lightened the skin lesions and reduce some of the puffy appearance. However her right face in the second branch of the trigeminal nerve has always remained more sensitive and it is hard for her to tolerate any touch or pressure on it. This is what complicates the treatment of obstructive sleep apnea. I have no doubt that the patient still has apnea and this is confirmed by her husband who witnessed snoring and apnea. She also has gained some weight. And overall she is of course for half years older now than when we saw her last. She reports being fatigued and having nonrestorative sleep a recurrence of nocturia fragmentation of sleep and in daytime fights the urged to fall asleep. This also affects her at work. In addition she has Osler nodes, changes to her iris and her inner eyelid, she is at a higher risk of developing glaucoma. Mild ataxia was noted on tandem gait and may relate to cerebellar or cervical spine lesions that can be seen with Sturge-Weber. This patient has no history of epilepsy.  The patient was advised of the nature of the diagnosed sleep disorder , the treatment options and risks for general a health and wellness arising from not treating the  condition. Over 50% of today's visit time was face-to-face discussion of her hip medical history previous  tests physical exam results and the plans for testing in the near future. This includes to order a split night polysomnography and to investigate with a local durable medical equipment company if we have any oral based CPAP masks available. These would not leave impressions on her sensitive skin lesions. Visit duration was 40 minutes.   Plan:  Treatment plan and additional workup : Split night polysomnography based on [employer] policy AHI 20, oxygen desaturation 4% score. She reports sleep related headaches that can last 6-8 hours but rarely does she wake up with them in the morning. I would like to order an additional hypercapnia study. I also will order an CT of the brain with and without contrast and if necessary CT angio.           Asencion Partridge Jareth Pardee MD  03/29/2015

## 2015-03-29 NOTE — Telephone Encounter (Signed)
Referral received. Pt is good for this appt today.

## 2015-03-29 NOTE — Patient Instructions (Signed)
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 It causes a vascular birthmark and neurological abnormalities. SYMPTOMS  Symptoms vary widely among patients.  They may include eye and internal organ irregularities.  The most apparent symptom is a facial birthmark or port wine stain. This is present at birth. It usually involves at least one upper eyelid and the forehead. The stain ranges in color from light pink to deep purple. It is caused by an overabundance of capillaries just beneath the surface of the affected skin.  Neurological symptoms include excessive blood vessel growth on the surface of the brain (angiomas). These angiomas are typically located on the posterior or occipital region of the brain. They do cause seizures. The seizures often start before one year of age. They may worsen with age. The convulsions usually appear on the side of the body opposite the port wine stain. They vary in severity. A weakening or loss of use of the side of the body opposite the port wine stain (hemiparesis) may also develop.  Developmental delay of motor and cognitive skills may occur.  Glaucoma is increased pressure within the eye. It may be present at birth or develop later.  Buphthalmos is enlargement of the coatings of the eye. It may also occur in the eye that is affected by the port wine stain.  Sturge-Weber syndrome rarely affects other body organs. TREATMENT  Treatment for this syndrome is symptomatic. Laser treatment is available to lighten or remove port wine stains. Anticonvulsant medications may be used to control seizures. Surgery or eyedrops may be prescribed to control glaucoma. SWS related seizures can sometimes place patients in potentially life threatening situations. But the disease itself is not fatal. Document Released: 08/02/2002 Document Revised: 11/04/2011 Document Reviewed: 08/13/2008 [facility]  Patient Information 2015 [place], [place]. This information is not intended to replace advice given to you by your health care provider. Make sure you discuss any questions you have with your health care provider.

## 2015-04-13 ENCOUNTER — Other Ambulatory Visit: Payer: Self-pay | Admitting: Physician Assistant

## 2015-04-13 ENCOUNTER — Encounter: Payer: Self-pay | Admitting: Physician Assistant

## 2015-04-13 MED ORDER — PHENTERMINE HCL 37.5 MG PO TABS: ORAL_TABLET | ORAL | Status: DC

## 2015-04-19 ENCOUNTER — Other Ambulatory Visit: Payer: Self-pay | Admitting: Internal Medicine

## 2015-04-20 ENCOUNTER — Inpatient Hospital Stay: Admission: RE | Admit: 2015-04-20 | Payer: 59 | Source: Ambulatory Visit

## 2015-04-21 ENCOUNTER — Other Ambulatory Visit: Payer: Self-pay | Admitting: Neurology

## 2015-04-21 DIAGNOSIS — G4730 Sleep apnea, unspecified: Secondary | ICD-10-CM

## 2015-04-21 DIAGNOSIS — G4710 Hypersomnia, unspecified: Secondary | ICD-10-CM

## 2015-04-21 DIAGNOSIS — G4733 Obstructive sleep apnea (adult) (pediatric): Secondary | ICD-10-CM

## 2015-04-21 DIAGNOSIS — Q858 Other phakomatoses, not elsewhere classified: Secondary | ICD-10-CM

## 2015-04-21 DIAGNOSIS — Q8589 Other phakomatoses, not elsewhere classified: Secondary | ICD-10-CM

## 2015-04-27 ENCOUNTER — Other Ambulatory Visit: Payer: Self-pay | Admitting: Physician Assistant

## 2015-04-28 ENCOUNTER — Other Ambulatory Visit: Payer: Self-pay | Admitting: *Deleted

## 2015-04-28 MED ORDER — PHENTERMINE HCL 37.5 MG PO TABS: ORAL_TABLET | ORAL | Status: DC

## 2015-05-05 ENCOUNTER — Ambulatory Visit
Admission: RE | Admit: 2015-05-05 | Discharge: 2015-05-05 | Disposition: A | Discharge status: Home / Self Care | Payer: 59 | Source: Ambulatory Visit | Attending: Neurology | Admitting: Neurology

## 2015-05-05 DIAGNOSIS — G4710 Hypersomnia, unspecified: Secondary | ICD-10-CM

## 2015-05-05 DIAGNOSIS — G0489 Other myelitis: Secondary | ICD-10-CM | POA: Diagnosis not present

## 2015-05-05 DIAGNOSIS — G4730 Sleep apnea, unspecified: Secondary | ICD-10-CM

## 2015-05-05 DIAGNOSIS — R519 Headache, unspecified: Secondary | ICD-10-CM

## 2015-05-05 DIAGNOSIS — Q8589 Other phakomatoses, not elsewhere classified: Secondary | ICD-10-CM

## 2015-05-05 DIAGNOSIS — G4733 Obstructive sleep apnea (adult) (pediatric): Secondary | ICD-10-CM

## 2015-05-05 DIAGNOSIS — Q858 Other phakomatoses, not elsewhere classified: Secondary | ICD-10-CM | POA: Diagnosis not present

## 2015-05-05 DIAGNOSIS — R51 Headache: Secondary | ICD-10-CM

## 2015-05-05 MED ORDER — IOPAMIDOL (ISOVUE-300) INJECTION 61%
75.0000 mL | Freq: Once | INTRAVENOUS | Status: AC | PRN
  Administered 2015-05-05: 75 mL via INTRAVENOUS

## 2015-05-08 ENCOUNTER — Telehealth: Payer: Self-pay

## 2015-05-08 NOTE — Telephone Encounter (Signed)
Called pt to give CT results. No answer, left a message asking her to call me back.

## 2015-05-08 NOTE — Telephone Encounter (Signed)
-----   Message from Hope Pigeon, RN sent at 05/08/2015  7:42 AM EDT -----   ----- Message -----    From: Britt Bottom, MD    Sent: 05/07/2015   4:22 PM      To: [facility]

## 2015-05-09 ENCOUNTER — Telehealth: Payer: Self-pay

## 2015-05-09 NOTE — Telephone Encounter (Signed)
Spoke to pt and advised her of Dr. Edwena Felty result notes. Pt wishes this CT result to be sent to her PCP. Pt inquired if we had gotten her MRI results, which we have not, so I advised pt that when we had them, that I would call her with results. Pt verbalized understanding.

## 2015-05-09 NOTE — Telephone Encounter (Signed)
See 05/09/15 telephone note. CT head results faxed to Dr. Melford Aase per pt request.

## 2015-05-09 NOTE — Telephone Encounter (Signed)
-----   Message from Hope Pigeon, RN sent at 05/08/2015  7:42 AM EDT -----   ----- Message -----    From: Britt Bottom, MD    Sent: 05/07/2015   4:22 PM      To: [facility]

## 2015-05-09 NOTE — Telephone Encounter (Signed)
Pt called about MRI result. Please call and advise

## 2015-05-17 ENCOUNTER — Telehealth: Payer: Self-pay | Admitting: Neurology

## 2015-05-17 NOTE — Telephone Encounter (Signed)
I called pt to make her appt.  If pt calls back, please make her an appt for an office visit or a follow up appt.

## 2015-05-17 NOTE — Telephone Encounter (Signed)
Patient called to advise she cancelled sleep study appointment. She needs to get these other tests paid off first before she can have sleep study done. Requests appointment with Dr. Brett Fairy to go over results of CT scan of head and MRI of neck and Rx for new nosepiece for CPAP machine that came out that Dr. Brett Fairy thinks may work for her.

## 2015-06-14 ENCOUNTER — Encounter: Payer: Self-pay | Admitting: Neurology

## 2015-06-14 ENCOUNTER — Ambulatory Visit (INDEPENDENT_AMBULATORY_CARE_PROVIDER_SITE_OTHER): Payer: 59 | Admitting: Neurology

## 2015-06-14 VITALS — BP 122/80 | HR 86 | Resp 20 | Ht 65.0 in | Wt 205.0 lb

## 2015-06-14 DIAGNOSIS — M542 Cervicalgia: Secondary | ICD-10-CM

## 2015-06-14 DIAGNOSIS — M5030 Other cervical disc degeneration, unspecified cervical region: Secondary | ICD-10-CM

## 2015-06-14 DIAGNOSIS — Q858 Other phakomatoses, not elsewhere classified: Secondary | ICD-10-CM

## 2015-06-14 DIAGNOSIS — G4733 Obstructive sleep apnea (adult) (pediatric): Secondary | ICD-10-CM | POA: Diagnosis not present

## 2015-06-14 DIAGNOSIS — Q8589 Other phakomatoses, not elsewhere classified: Secondary | ICD-10-CM

## 2015-06-14 MED ORDER — AMITRIPTYLINE HCL 10 MG PO TABS: 10.0000 mg | ORAL_TABLET | Freq: Every day | ORAL | Status: DC

## 2015-06-14 NOTE — Patient Instructions (Signed)
I will ask Kristina Webb to follow-up after her nocturnal pulse oximetry. I uses as a replacement for home sleep test which the patient could not afford. If there is tachybradycardia arrhythmia, prolonged hypoxemia or an AHI over 15 noted I will ask the local DME to adjust her current machine to auto titration. Her CPAP may be too old to allow this kind of function switch and in that case we will have to negotiate if [facility]  can find a rental machine for her.

## 2015-06-14 NOTE — Progress Notes (Signed)
SLEEP MEDICINE CLINIC   Provider:  Larey Seat, M D  Referring Provider: Unk Pinto, MD Primary Care Physician:  Alesia Richards, MD  Chief Complaint  Patient presents with  . Follow-up    CT and MRI results, cannot afford sleep test at this time, wants to discuss mask options for her cpap, Robin fitted her for Resmed p10 medium,  rm 11, alone    HPI:  Kristina Webb is a 59 y.o. female ,  seen here as a referral   from Dr. Melford Aase for a new sleep evaluation.  She is a former Dr Erling Cruz and Dr. Estella Husk patient with [name].  [name] was referred by Dr. 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 Estella Husk , when he worked her up for headaches and neck pain. The patient also reported now return of her frequent headaches. She was referred for witnessed snoring and arousals from snoring and had endorsed the Epworth sleepiness score at the time at 10 points. Her BMI was 39. She was diagnosed with an AHI of 20.8 RDI 24.5 oxygen nadir was 78% the lowest in supine REM sleep. In frequent periodic limb movements were noted. The patient returned for CPAP titration on 04-17-11 , and the choice of interface was a great challenge due to Reynolds condition. She was titrated to 13 cm water and a comfort gel medium size full facemask was used. I had instructed the technician not to use a full face mask and be changed later to a nasal pillow after our visit. [name] reports that due to the [employer] there was intolerance to  any facial interface- she stopped using her CPAP about a year ago, she saw Dr. Augustina Mood DDS , who explained to her that only only mild apnea without significant oxygen desaturation is usually applicable to the dental treatment.  Her husband has reported witnessing apneas on a  very frequent basis and hears her snoring loudly, too.  Her Epworth sleepiness score today is endorsed at 5 points and her fatigue severity at 43 points. Fatigue definitely is higher than normal. She also reports that she notice a difference since not using CPAP she felt better refreshed had no longer nocturia when she was on CPAP and had less headaches and neck pain. All the symptoms have returned since her discontinuation of the therapy. I still think 4 years ago her residual AHI was 5.3. Please note that the patient had CSF testing which was negative for IgG and oligoclonal, on 06-13-2006.  She also had an MRI study of the brain and cervical spine on 12-13-10 and previously on 05-08-2006.  06-14-15; I see [name] today for revisit. The patient had undergone a sleep study in 2012 and at all seen a dentist to recommended a possible dental treatment for apnea and snoring but the patient was unable at the time to afford it. We also discussed our last visit that I would like to repeat a sleep study but this is right now also outside of her financial possibility. An MRI of the cervical spine for her history of transverse myelitis was obtained and compared to a study from 2008. This new study was obtained on 05-05-15 and it showed broad disc bulging and some bone spurring moderate left foraminal narrowing but no  nerve root impingement at C5 and C6. Within the spinal cord was still seen adjacent to the C2 vertebral body at T2 hyperintense focus measuring about 14 mm length , and the spinal cord was slightly expanded at the level. No bony injury seen She also underwent a CT of the head which showed mild generalized cortical atrophy, no abnormal calcifications are noted. Mild lateral ventricle enlargement.  Dr. Felecia Shelling .    Review of Systems: Out of a complete 14 system review, the patient complains of only the following symptoms, and all other reviewed systems are negative. OSA witnessed, snoring and apnea, no  seizures, no epilepsy, no ataxia. No glaucoma with Sturge -Weber!.  Pulsating neck pain.  Epworth score 9 , Fatigue severity score 43.  fatigue and hypersomnia, but unable to afford a new sleep test.  The patient does have a CPAP and a new mask at home.  Dr Vira Blanco is her pain management doctor.     Social History   Social History  . Marital Status: Married    Spouse Name: N/A  . Number of Children: 0  . Years of Education: N/A   Occupational History  . merchandise services    Social History Main Topics  . Smoking status: Never Smoker   . Smokeless tobacco: Never Used  . Alcohol Use: No  . Drug Use: No  . Sexual Activity: Not on file   Other Topics Concern  . Not on file   Social History Narrative   Drinks about 1/2 can soda daily.    Family History  Problem Relation Age of Onset  . Emphysema Mother     smoked  . Allergies Mother   . Asthma Mother   . Heart disease Mother   . Breast cancer Mother     with mets to Bone  . Prostate cancer Father   . Brain cancer Father     Past Medical History  Diagnosis Date  . Hypertension   . GERD (gastroesophageal reflux disease)   . Deviated septum   . Anemia   . Asthma   . Anxiety   . Depression   . OSA (obstructive sleep apnea)   . Hyperlipemia   . Diabetes [facility])     Past Surgical History  Procedure Laterality Date  . Back surgery      Current Outpatient Prescriptions  Medication Sig Dispense Refill  . albuterol (PROAIR HFA) 108 (90 BASE) MCG/ACT inhaler Inhale 2 puffs into the lungs every 6 (six) hours as needed for wheezing.    Marland Kitchen amitriptyline (ELAVIL) 10 MG tablet TAKE 1 TO 2 TABLETS BY MOUTH DAILY AT BEDTIME 60 tablet 99  . budesonide-formoterol (SYMBICORT) 80-4.5 MCG/ACT inhaler Inhale 2 puffs into the lungs 2 (two) times daily.    . chlorpheniramine (CHLOR-TRIMETON) 4 MG tablet Take 4 mg by mouth every 6 (six) hours as needed for allergies.    . Cyanocobalamin (VITAMIN B 12 PO) Take 1 tablet by mouth  daily.    . cyclobenzaprine (FLEXERIL) 10 MG tablet TAKE 1 TO 2 TABLETS BY MOUTH DAILY AT NIGHT TIME AS NEEDED FOR HEADACHE AND JAW PAIN 60 tablet 2  . furosemide (LASIX) 40 MG tablet TAKE 1 TABLET EVERY MORNING FOR BLOOD PRESSURE AND FLUID 90 tablet 1  . levothyroxine (SYNTHROID, LEVOTHROID) 75 MCG tablet TAKE 1 TABLET DAILY 90 tablet 3  . losartan (COZAAR) 100 MG tablet Take 1 tablet (100 mg total) by mouth daily. 90 tablet 4  . metFORMIN (GLUCOPHAGE-XR) 500 MG 24  hr tablet TAKE 1 TABLET BY MOUTH TWICE DAILY WITH FOOD 60 tablet 3  . Multiple Vitamins-Minerals (ZINC PO) Take 1 tablet by mouth daily.    . NUCYNTA 50 MG TABS tablet TAKE 1 TABLET 4 TIMES A DAY  0  . pantoprazole (PROTONIX) 40 MG tablet TAKE 1 TABLET DAILY 90 tablet 3  . phentermine (ADIPEX-P) 37.5 MG tablet Take 1/2 to 1 tablet daily for dieting and weight loss 30 tablet 2  . potassium chloride SA (K-DUR,KLOR-CON) 20 MEQ tablet TAKE 1 TABLET DAILY 90 tablet 1  . sertraline (ZOLOFT) 100 MG tablet Take 1-2 tablets daily for anxiety 180 tablet 3   No current facility-administered medications for this visit.    Allergies as of 06/14/2015 - Review Complete 06/14/2015  Allergen Reaction Noted  . Doxycycline  09/19/2013  . Levaquin [levofloxacin in d5w]  09/19/2013  . Prednisone  09/19/2013    Vitals: BP 122/80 mmHg  Pulse 86  Resp 20  Ht 5\' 5"  (1.651 m)  Wt 205 lb (92.987 kg)  BMI 34.11 kg/m2 Last Weight:  Wt Readings from Last 1 Encounters:  06/14/15 205 lb (92.987 kg)       Last Height:   Ht Readings from Last 1 Encounters:  06/14/15 5\' 5"  (1.651 m)    Physical exam:  General: The patient is awake, alert and appears not in acute distress. The patient is well groomed. Head: Normocephalic, atraumatic. Neck is supple. Mallampati 3   neck circumference: 15.75. Nasal airflow unrestricted  TMJ is evident . Retrognathia is seen.  Cardiovascular:  Regular rate and rhythm , without  murmurs or carotid bruit, and without  distended neck veins. Respiratory: Lungs are clear to auscultation. Skin:  Sturge weber V 2 right,  Trunk: BMI is  elevated and patient  has normal posture.  Neurologic exam : The patient is awake and alert, oriented to place and time.   Memory subjective described as intact. There is a normal attention span & concentration ability.  Speech is fluent without  dysarthria, dysphonia or aphasia. Mood and affect are appropriate.  Cranial nerves: Pupils are equal and briskly reactive to light. Funduscopic exam without evidence of pallor or edema.  Extraocular movements  in vertical and horizontal planes intact and without nystagmus. Visual fields by finger perimetry are intact. Hearing to finger rub intact.  Facial sensation intact to fine touch. Facial motor strength is symmetric and tongue and uvula move midline.  The patients right upper lip appears swollen ,and the eye lid rim is irregular form sturge weber.  Motor exam:   Normal tone ,muscle bulk and symmetric ,strength in all extremities. Sensory:  Fine touch, pinprick and vibration were tested in all extremities. Proprioception is normal. Coordination: Rapid alternating movements in the fingers/hands and Finger-to-nose maneuver normal without evidence of ataxia, dysmetria or tremor. Gait and station: Patient walks without assistive device and is able unassisted to climb up to the exam table. Strength within normal limits. Stance is stable and normal.  Tandem gait is unsteady- she has no trouble with heel and toe stand. Romberg testing is negative. Deep tendon reflexes: in the  upper and lower extremities are brisk, symmetric and intact. Babinski maneuver response is downgoing.   Assessment:  After physical and neurologic examination, review of laboratory studies, imaging, neurophysiology testing and pre-existing records, assessment is  Mrs. Scalia was surprised that he find a 14 mm scar in her cervical spine at the C2 level but this fits  her history of a  transverse myelitis.  She had an opportunity to see the images. In addition , the  Sturge Weber syndrome and predispose her to vascular calcifications. This was not found on her CT scan of the head. And a cervical spine film showed otherwise normal vertebral bodies and just some degenerative disc disease at his age related broad disc bulging and mild bilateral foraminal narrowing at C4-5 and moderate left-sided foraminal narrowing at C5 and C6. Mild generalized atrophy of the brain please noticed that there was no interval change in comparison to 03-25-2006. Visit duration was 25 minutes, with new mask fit and unfortunatley did not bring her machine.More than 50% of the face to face time was dedicated to review and discussion of images and the consequences for her headache and neck pain treatment .  Since we have to find a way to test Mrs. Hutt again, I would have liked a home sleep test or perhaps an hour to titrate her. For [employer] actually favors home sleep test but it was still to expensive for her. She has to pay 20% of the cost of procedures. I ordered an overnight ONO pulse oximetry to screen again for apnea.    Plan:  Treatment plan and additional workup : Split night polysomnography based on [employer] policy AHI 20, oxygen desaturation 4% score was unaffordable for the patient.   She reports sleep related headaches that can last 6-8 hours but rarely does she wake up with them in the morning. ONO ordered. New supplies for the CPAP ordered. If ONO suggestive of apnea. Will start auto-titrator 5-15 cm water .   Neck pain, head pain. Right side retroauricular, radiating to the forehead . Treated with ice packs. Let's treat as tension headaches.    Cc;  Dr. [place] Pink Rian Busche MD  06/14/2015

## 2015-06-26 ENCOUNTER — Encounter: Payer: Self-pay | Admitting: Physician Assistant

## 2015-07-03 ENCOUNTER — Other Ambulatory Visit: Payer: Self-pay | Admitting: Internal Medicine

## 2015-07-04 ENCOUNTER — Encounter: Payer: Self-pay | Admitting: Neurology

## 2015-07-04 NOTE — Telephone Encounter (Signed)
Spoke to pt. She reports that she is having headaches on the right side of her head only at night. She completed the ONO through [facility]. I will obtain those results today. She also reports that she has NOT been wearing her cpap. I advised her to wear her cpap, and that I would forward this message to Dr. Brett Fairy to get her thoughts on it.   We talked about my headache that I am having on my right side of my head. It starts at the base of my neck, ear, temple, fore head and the right top. I have been getting these headaches for about 2 years now. They come sporadic sometimes a couple days in a roll and then maybe only one day and skips weeks. They usually come at night time. I believe they are getting worse each time.   Last night it started about 10pm I took a 325 Aspirin, 10mg  Amitriptyline, 10mg  Cyclobenzaprine gave them about 1 hour to work, took 1/2 Nucynta 50mg  gave it about 1 hour, took the other 1/2, got a ice pack on put on my neck. Finally got some relieve. No sleep to speak of. This morning I took 7 baby Aspirin and I have a dull pain in my fore head and neck.   I can't remember what you said you thought was wrong, as to why I'm having these. I know Vicente Males from pain management thinks it is the nerve and she wanted to put a shot in it.   I'm concern can you help me. [phone]

## 2015-07-05 ENCOUNTER — Encounter: Payer: Self-pay | Admitting: Neurology

## 2015-07-05 ENCOUNTER — Telehealth: Payer: Self-pay

## 2015-07-05 MED ORDER — SUMATRIPTAN SUCCINATE 100 MG PO TABS: 100.0000 mg | ORAL_TABLET | Freq: Once | ORAL | Status: DC | PRN

## 2015-07-05 NOTE — Telephone Encounter (Signed)
Pt called because she had not heard back from [place] regarding the mychart message she sent this morning asking Dr. Brett Fairy to prescribe imitrex for her migraine. Her mychart message reads:  "We talked about my headache that I am having on my right side of my head. It starts at the base of my neck, ear, temple, fore head and the right top. I have been getting these headaches for about 2 years now. They come sporadic sometimes a couple days in a roll and then maybe only one day and skips weeks. They usually come at night time. I believe they are getting worse each time.  Last night it started about 10pm I took a 325 Aspirin, 10mg  Amitriptyline, 10mg  Cyclobenzaprine gave them about 1 hour to work, took 1/2 Nucynta 50mg  gave it about 1 hour, took the other 1/2, got a ice pack on put on my neck. Finally got some relieve. No sleep to speak of. This morning I took 7 baby Aspirin and I have a dull pain in my fore head and neck.  I can't remember what you said you thought was wrong, as to why I'm having these. I know Vicente Males from pain management thinks it is the nerve and she wanted to put a shot in it."  I spoke to pt and she reports that her pain management doctor went to [facility] so she doesn't have one any more.  Pt is requesting to know if Dr. Brett Fairy will prescribe something ASAP for her migraine, or else she will have to go to ER because it hurts so bad. I advised pt that Dr. Brett Fairy is seeing pts this afternoon and we will get back to her as soon as we can. Pt verbalized understanding.

## 2015-07-05 NOTE — Telephone Encounter (Signed)
Filled sumatriptan 100 mg . 10 pills. CD

## 2015-07-05 NOTE — Telephone Encounter (Signed)
Spoke to pt and advised her that Dr. Brett Fairy filled imitrex for her and it was sent to her [place]. I advised her to take one tablet by mouth and take another 2 hours later if not working but do not exceed 2 tablets every 24 hours. Pt verbalized understanding.

## 2015-07-07 ENCOUNTER — Encounter: Payer: Self-pay | Admitting: Neurology

## 2015-07-10 ENCOUNTER — Telehealth: Payer: Self-pay

## 2015-07-10 NOTE — Telephone Encounter (Signed)
Spoke to pt and advised her that Dr. Brett Fairy received her ONO results and noted that pt does not need any supplemental oxygen. Pt verbalized understanding. Pt knows that she has a follow up on 09/19/2015 at 7:30 with Jinny Blossom, NP.

## 2015-07-17 ENCOUNTER — Ambulatory Visit (INDEPENDENT_AMBULATORY_CARE_PROVIDER_SITE_OTHER): Payer: 59 | Admitting: Physician Assistant

## 2015-07-17 ENCOUNTER — Encounter: Payer: Self-pay | Admitting: Physician Assistant

## 2015-07-17 VITALS — BP 110/70 | HR 67 | Temp 97.0°F | Resp 16 | Ht 64.5 in | Wt 202.0 lb

## 2015-07-17 DIAGNOSIS — E785 Hyperlipidemia, unspecified: Secondary | ICD-10-CM

## 2015-07-17 DIAGNOSIS — N182 Chronic kidney disease, stage 2 (mild): Secondary | ICD-10-CM

## 2015-07-17 DIAGNOSIS — E119 Type 2 diabetes mellitus without complications: Secondary | ICD-10-CM

## 2015-07-17 DIAGNOSIS — M545 Low back pain, unspecified: Secondary | ICD-10-CM | POA: Insufficient documentation

## 2015-07-17 DIAGNOSIS — Z0000 Encounter for general adult medical examination without abnormal findings: Secondary | ICD-10-CM | POA: Diagnosis not present

## 2015-07-17 DIAGNOSIS — Z79899 Other long term (current) drug therapy: Secondary | ICD-10-CM

## 2015-07-17 DIAGNOSIS — F329 Major depressive disorder, single episode, unspecified: Secondary | ICD-10-CM

## 2015-07-17 DIAGNOSIS — I10 Essential (primary) hypertension: Secondary | ICD-10-CM | POA: Diagnosis not present

## 2015-07-17 DIAGNOSIS — J4520 Mild intermittent asthma, uncomplicated: Secondary | ICD-10-CM

## 2015-07-17 DIAGNOSIS — M5442 Lumbago with sciatica, left side: Secondary | ICD-10-CM

## 2015-07-17 DIAGNOSIS — R059 Cough, unspecified: Secondary | ICD-10-CM

## 2015-07-17 DIAGNOSIS — Q8589 Other phakomatoses, not elsewhere classified: Secondary | ICD-10-CM

## 2015-07-17 DIAGNOSIS — G4733 Obstructive sleep apnea (adult) (pediatric): Secondary | ICD-10-CM

## 2015-07-17 DIAGNOSIS — Q858 Other phakomatoses, not elsewhere classified: Secondary | ICD-10-CM

## 2015-07-17 DIAGNOSIS — K2100 Gastro-esophageal reflux disease with esophagitis, without bleeding: Secondary | ICD-10-CM

## 2015-07-17 DIAGNOSIS — E559 Vitamin D deficiency, unspecified: Secondary | ICD-10-CM

## 2015-07-17 DIAGNOSIS — F32A Depression, unspecified: Secondary | ICD-10-CM

## 2015-07-17 DIAGNOSIS — R05 Cough: Secondary | ICD-10-CM

## 2015-07-17 DIAGNOSIS — D649 Anemia, unspecified: Secondary | ICD-10-CM

## 2015-07-17 DIAGNOSIS — Z0001 Encounter for general adult medical examination with abnormal findings: Secondary | ICD-10-CM

## 2015-07-17 LAB — CBC WITH DIFFERENTIAL/PLATELET
Basophils Absolute: 0 10*3/uL (ref 0.0–0.1)
Basophils Relative: 0 % (ref 0–1)
Eosinophils Absolute: 0.7 10*3/uL (ref 0.0–0.7)
Eosinophils Relative: 7 % — ABNORMAL HIGH (ref 0–5)
HCT: 40.5 % (ref 36.0–46.0)
Hemoglobin: 13 g/dL (ref 12.0–15.0)
Lymphocytes Relative: 28 % (ref 12–46)
Lymphs Abs: 2.7 10*3/uL (ref 0.7–4.0)
MCH: 26.6 pg (ref 26.0–34.0)
MCHC: 32.1 g/dL (ref 30.0–36.0)
MCV: 82.8 fL (ref 78.0–100.0)
MPV: 9.1 fL (ref 8.6–12.4)
Monocytes Absolute: 0.5 10*3/uL (ref 0.1–1.0)
Monocytes Relative: 5 % (ref 3–12)
Neutro Abs: 5.9 10*3/uL (ref 1.7–7.7)
Neutrophils Relative %: 60 % (ref 43–77)
Platelets: 288 10*3/uL (ref 150–400)
RBC: 4.89 MIL/uL (ref 3.87–5.11)
RDW: 15.5 % (ref 11.5–15.5)
WBC: 9.8 10*3/uL (ref 4.0–10.5)

## 2015-07-17 LAB — HEMOGLOBIN A1C
Hgb A1c MFr Bld: 6.1 % — ABNORMAL HIGH (ref <5.7)
Mean Plasma Glucose: 128 mg/dL — ABNORMAL HIGH (ref <117)

## 2015-07-17 MED ORDER — PREDNISONE 20 MG PO TABS: ORAL_TABLET | ORAL | Status: AC

## 2015-07-17 MED ORDER — AZITHROMYCIN 250 MG PO TABS: ORAL_TABLET | ORAL | Status: AC

## 2015-07-17 MED ORDER — DICLOFENAC EPOLAMINE 1.3 % TD PTCH: 1.0000 | MEDICATED_PATCH | Freq: Every morning | TRANSDERMAL | Status: DC

## 2015-07-17 MED ORDER — PHENTERMINE HCL 37.5 MG PO TABS: ORAL_TABLET | ORAL | Status: DC

## 2015-07-17 NOTE — Patient Instructions (Signed)
If you want you can try to take amitriptyline twice a day We can also try to switch your zoloft to cymbalta for your headaches and anxiety/depression.   Diabetes is a very complicated disease...lets simplify it.  An easy way to look at it to understand the complications is if you think of the extra sugar floating in your blood stream as glass shards floating through your blood stream.    Diabetes affects your small vessels first: 1) The glass shards (sugar) scraps down the tiny blood vessels in your eyes and lead to diabetic retinopathy, the leading cause of blindness in the [place]. Diabetes is the leading cause of newly diagnosed adult (64 to 58 years of age) blindness in the [place].  2) The glass shards scratches down the tiny vessels of your legs leading to nerve damage called neuropathy and can lead to amputations of your feet. More than 60% of all non-traumatic amputations of lower limbs occur in people with diabetes.  3) Over time the small vessels in your brain are shredded and closed off, individually this does not cause any problems but over a long period of time many of the small vessels being blocked can lead to Vascular Dementia.   4) Your kidney's are a filter system and have a "net" that keeps certain things in the body and lets bad things out. Sugar shreds this net and leads to kidney damage and eventually failure. Decreasing the sugar that is destroying the net and certain blood pressure medications can help stop or decrease progression of kidney disease. Diabetes was the primary cause of kidney failure in 44 percent of all new cases in 2011.  5) Diabetes also destroys the small vessels in your penis that lead to erectile dysfunction. Eventually the vessels are so damaged that you may not be responsive to cialis or viagra.   Diabetes and your large vessels: Your larger vessels consist of your coronary arteries in your heart and the carotid vessels to your brain. Diabetes or even  increased sugars put you at 300% increased risk of heart attack and stroke and this is why.. The sugar scrapes down your large blood vessels and your body sees this as an internal injury and tries to repair itself. Just like you get a scab on your skin, your platelets will stick to the blood vessel wall trying to heal it. This is why we have diabetics on low dose aspirin daily, this prevents the platelets from sticking and can prevent plaque formation. In addition, your body takes cholesterol and tries to shove it into the open wound. This is why we want your LDL, or bad cholesterol, below 70.   The combination of platelets and cholesterol over 5-10 years forms plaque that can break off and cause a heart attack or stroke.   PLEASE REMEMBER:  Diabetes is preventable! Up to 21 percent of complications and morbidities among individuals with type 2 diabetes can be prevented, delayed, or effectively treated and minimized with regular visits to a health professional, appropriate monitoring and medication, and a healthy diet and lifestyle.    Bad carbs also include fruit juice, alcohol, and sweet tea. These are empty calories that do not signal to your brain that you are full.   Please remember the good carbs are still carbs which convert into sugar. So please measure them out no more than 1/2-1 cup of rice, oatmeal, pasta, and beans  Veggies are however free foods! Pile them on.   Not all fruit  is created equal. Please see the list below, the fruit at the bottom is higher in sugars than the fruit at the top. Please avoid all dried fruits.       Vascular Dementia Vascular dementia is a common cause of dementia in the elderly. Dementia is a condition that affects the brain and causes people to not think well or act normally. Vascular dementia is one type of dementia. It occurs when blood clots block small blood vessels in the brain and destroy brain tissue. Likely risk factors are high blood pressure  and advanced age. This disease can cause stroke, migraine-like headaches, and psychiatric disturbances.  SYMPTOMS  5. Confusion. 6. Problems with recent memory. 7. Wandering or getting lost in familiar places. 8. Loss of bladder or bowel control (incontinence). 9. Unsteady gait. 10. Poor attention and concentration. 11. Emotional problems such as laughing or crying inappropriately. 12. Difficulty following instructions. 13. Problems handling money. 14. Depression. 15. Difficulty planning ahead. Usually the damage is slight at first. Over time, as more small vessels are blocked, there is a gradual mental decline. However, symptoms may begin suddenly. Symptoms may be very similar to Alzheimer's disease. The two forms of dementia may occur together. Vascular dementia typically begins between the ages of 47 and 44. It affects men more often than women. TREATMENT   Currently there is no treatment for vascular dementia that can reverse the damage that has already occurred.  Treatment focuses on prevention of additional brain damage and improvement of symptoms.  It is important to treat the risk factors for vascular dementia, such as keeping blood pressure under control, treating diabetes, lowering cholesterol, and stop smoking. PROGNOSIS   Prognosis for patients is generally poor. Individuals with the disease may improve for short periods of time, then get worse again. Early treatment and management of blood pressure and other risk factors may prevent further worsening of the disorder.   This information is not intended to replace advice given to you by your health care provider. Make sure you discuss any questions you have with your health care provider.   Document Released: 08/02/2002 Document Revised: 11/04/2011 Document Reviewed: 11/23/2014 Elsevier Interactive Patient Education [employer].

## 2015-07-17 NOTE — Progress Notes (Signed)
Complete Physical  Assessment and Plan: 1. Essential hypertension - EKG 12-Lead - [place], RETROPERITNL ABD,  LTD  2. Asthma, mild intermittent, uncomplicated symbicort samples given  3. OSA (obstructive sleep apnea) Suggest CPAP  4. Gastroesophageal reflux disease with esophagitis Continue protonix and can try adding zantac at night for 1 week due to cough  5. CKD (chronic kidney disease) stage 2, GFR 60-89 ml/min Check BMP  6. Anemia, unspecified anemia type Check CBC, she is off iron  7. Depression Does not want to change meds at this time but discussed switching from zoloft to cymbalta due to migraines/pain  8. Hyperlipemia -continue medications, check lipids, decrease fatty foods, increase activity.   9. Obesity Obesity with co morbidities- long discussion about weight loss, diet, and exercise, continue phentermine through holidays will decrease use after next visit.   10. Encounter for general adult medical examination with abnormal findings - CBC with Differential - BASIC METABOLIC PANEL WITH GFR - Lipid panel - Hepatic function panel - TSH - Hemoglobin A1c - Insulin, fasting - Vit D  25 hydroxy (rtn osteoporosis monitoring) - Urinalysis, Routine w reflex microscopic - Microalbumin / creatinine urine ratio - Vitamin B12 - Magnesium - Iron and TIBC - Ferritin  11. Anemia, unspecified anemia type - Iron and TIBC - Ferritin - Vitamin B12  13. Cough - azithromycin (ZITHROMAX) 250 MG tablet; Take 2 tablets (500 mg) on  Day 1,  followed by 1 tablet (250 mg) once daily on Days 2 through 5.  Dispense: 6 each; Refill: 1  13. Left-sided low back pain with left-sided sciatica - predniSONE (DELTASONE) 20 MG tablet; 1 pill 3 x a day for 3 days, 1 pill 2 x a day x 3 days, 1 pill a day x 5 days with food  Dispense: 20 tablet; Refill: 0     Discussed med's effects and SE's. Screening labs and tests as requested with regular follow-up as recommended.  HPI  59 y.o.  female  presents for a complete physical.   Her blood pressure has been controlled at home, today their BP is BP: 110/70 mmHg She does workout. She denies chest pain, shortness of breath, dizziness.  She is not on cholesterol medication and denies myalgias. Her cholesterol is at goal. The cholesterol last visit was:   Lab Results  Component Value Date   CHOL 207* 01/25/2015   HDL 65 01/25/2015   LDLCALC 115* 01/25/2015   TRIG 136 01/25/2015   CHOLHDL 3.2 01/25/2015    She has been working on diet and exercise for prediabetes, she is on metformin, and denies paresthesia of the feet, polydipsia and polyuria. Last A1C in the office was:  Lab Results  Component Value Date   HGBA1C 6.1* 01/25/2015   Patient is on Vitamin D supplement.   Lab Results  Component Value Date   VD25OH 30 01/25/2015     BMI is Body mass index is 34.15 kg/(m^2)., she is working on diet and exercise and has done well. Her weight is increasing slightly due to stress/holidays. Her husband is in the hospital for psychosis. She follows with Dr. Brett Fairy for OSA, has had migraines, had migraine x 5 days beginning of this month, imitrex helped.  Wt Readings from Last 3 Encounters:  07/17/15 202 lb (91.627 kg)  06/14/15 205 lb (92.987 kg)  03/29/15 208 lb (94.348 kg)   She is on thyroid medication. Her medication was not changed last visit. Patient denies nervousness, palpitations and weight changes.  Lab Results  Component Value Date   TSH 1.593 01/25/2015  .  She has asthma, she is on albuterol PRN, has sinus issues and recent cough with green mucus.   Current Medications:  Current Outpatient Prescriptions on File Prior to Visit  Medication Sig Dispense Refill  . albuterol (PROAIR HFA) 108 (90 BASE) MCG/ACT inhaler Inhale 2 puffs into the lungs every 6 (six) hours as needed for wheezing.    Marland Kitchen amitriptyline (ELAVIL) 10 MG tablet Take 1 tablet (10 mg total) by mouth at bedtime. 90 tablet 3  .  budesonide-formoterol (SYMBICORT) 80-4.5 MCG/ACT inhaler Inhale 2 puffs into the lungs 2 (two) times daily.    . chlorpheniramine (CHLOR-TRIMETON) 4 MG tablet Take 4 mg by mouth every 6 (six) hours as needed for allergies.    . Cyanocobalamin (VITAMIN B 12 PO) Take 1 tablet by mouth daily.    . cyclobenzaprine (FLEXERIL) 10 MG tablet TAKE 1 TO 2 TABLETS BY MOUTH DAILY AT NIGHT TIME AS NEEDED FOR HEADACHE AND JAW PAIN 60 tablet 2  . furosemide (LASIX) 40 MG tablet TAKE 1 TABLET EVERY MORNING FOR BLOOD PRESSURE AND FLUID 90 tablet 1  . levothyroxine (SYNTHROID, LEVOTHROID) 75 MCG tablet TAKE 1 TABLET DAILY 90 tablet 3  . losartan (COZAAR) 100 MG tablet Take 1 tablet (100 mg total) by mouth daily. 90 tablet 4  . metFORMIN (GLUCOPHAGE-XR) 500 MG 24 hr tablet TAKE 1 TABLET BY MOUTH TWICE DAILY WITH FOOD 180 tablet 1  . Multiple Vitamins-Minerals (ZINC PO) Take 1 tablet by mouth daily.    . NUCYNTA 50 MG TABS tablet TAKE 1 TABLET 4 TIMES A DAY  0  . pantoprazole (PROTONIX) 40 MG tablet TAKE 1 TABLET DAILY 90 tablet 3  . phentermine (ADIPEX-P) 37.5 MG tablet Take 1/2 to 1 tablet daily for dieting and weight loss 30 tablet 2  . potassium chloride SA (K-DUR,KLOR-CON) 20 MEQ tablet TAKE 1 TABLET DAILY 90 tablet 1  . sertraline (ZOLOFT) 100 MG tablet Take 1-2 tablets daily for anxiety 180 tablet 3  . SUMAtriptan (IMITREX) 100 MG tablet Take 1 tablet (100 mg total) by mouth once as needed for migraine. May repeat in 2 hours if headache persists or recurs. 10 tablet 2   No current facility-administered medications on file prior to visit.   Health Maintenance:   Immunization History  Administered Date(s) Administered  . Influenza Split 04/26/2013, 05/29/2015  . Influenza Whole 05/26/2012  . Td 08/27/2007   Tetanus: 2009 Pneumovax: Flu vaccine: 2015 Zostavax: LMP:2007 Pap: 2013 neg due 2016- Dr. Perlie Gold.  MGM:06/2014  DEXA: Colonoscopy: 12/2012 normal EGD: 2010 Last Dental Exam: Dr. Lin Landsman, q   6 months.  Last Eye Exam: Dr. Claudean Kinds 01/13/2014 Ct head 04/2015  Patient Care Team: Unk Pinto, MD as PCP - General (Internal Medicine) Richmond Campbell, MD as Consulting Physician (Gastroenterology) Newt Minion, MD as Consulting Physician (Orthopedic Surgery) Lennon Alstrom, MD as Consulting Physician (Neurology) Ralene Bathe, MD as Consulting Physician (Ophthalmology)  Allergies:  Allergies  Allergen Reactions  . Doxycycline   . Levaquin [Levofloxacin In D5w]   . Prednisone    Medical History:  Past Medical History  Diagnosis Date  . Hypertension   . GERD (gastroesophageal reflux disease)   . Deviated septum   . Anemia   . Asthma   . Anxiety   . Depression   . OSA (obstructive sleep apnea)   . Hyperlipemia   . Diabetes [facility])    Surgical History:  Past Surgical History  Procedure Laterality Date  . Back surgery     Family History:  Family History  Problem Relation Age of Onset  . Emphysema Mother     smoked  . Allergies Mother   . Asthma Mother   . Heart disease Mother   . Breast cancer Mother     with mets to Bone  . Prostate cancer Father   . Brain cancer Father    Social History:  Social History  Substance Use Topics  . Smoking status: Never Smoker   . Smokeless tobacco: Never Used  . Alcohol Use: No   Review of Systems  Constitutional: Positive for malaise/fatigue. Negative for fever, chills, weight loss and diaphoresis.  HENT: Negative for congestion, ear discharge, ear pain, hearing loss, nosebleeds, sore throat and tinnitus.   Eyes: Negative.   Respiratory: Negative.  Negative for stridor.   Cardiovascular: Negative.   Gastrointestinal: Positive for constipation. Negative for heartburn, nausea, vomiting, abdominal pain, diarrhea, blood in stool and melena.  Genitourinary: Negative.   Musculoskeletal: Positive for back pain. Negative for myalgias, joint pain, falls and neck pain.  Skin: Negative.   Neurological: Positive for tingling  (left leg) and headaches. Negative for dizziness, tremors, sensory change, speech change, focal weakness, seizures, loss of consciousness and weakness.  Psychiatric/Behavioral: Positive for depression and memory loss. Negative for suicidal ideas, hallucinations and substance abuse. The patient has insomnia. The patient is not nervous/anxious.      Physical Exam: Estimated body mass index is 34.15 kg/(m^2) as calculated from the following:   Height as of this encounter: 5' 4.5" (1.638 m).   Weight as of this encounter: 202 lb (91.627 kg). BP 110/70 mmHg  Pulse 67  Temp(Src) 97 F (36.1 C) (Temporal)  Resp 16  Ht 5' 4.5" (1.638 m)  Wt 202 lb (91.627 kg)  BMI 34.15 kg/m2  SpO2 97% General Appearance: Well nourished, in no apparent distress.  Eyes: PERRLA, EOMs, conjunctiva no swelling or erythema, normal fundi and vessels.  Sinuses: No Frontal/maxillary tenderness  ENT/Mouth: Ext aud canals clear, normal light reflex with TMs without erythema, bulging. Good dentition. No erythema, swelling, or exudate on post pharynx. Tonsils not swollen or erythematous. Hearing normal.  Neck: Supple, thyroid normal. No bruits  Respiratory: Respiratory effort normal, BS equal bilaterally without rales, rhonchi, wheezing or stridor.  Cardio: RRR without murmurs, rubs or gallops. Brisk peripheral pulses without edema.  Chest: symmetric, with normal excursions and percussion.  Breasts: defer Abdomen: Soft, mild epigastric tenderness, with ventral hernia, no guarding, rebound, masses, or organomegaly.  Lymphatics: Non tender without lymphadenopathy.  Genitourinary: defer Musculoskeletal: Full ROM all peripheral extremities,5/5 strength, and normal gait. Patient is able to ambulate well. Gait is  Antalgic. Straight leg raising with dorsiflexion negative bilaterally for radicular symptoms. Sensory exam in the legs are normal. Knee reflexes are normal Ankle reflexes are normal Strength is normal and symmetric in  arms and legs. There is SI tenderness to palpation.  There isparaspinal muscle spasm.  There is not midline tenderness.  ROM of spine with  limited in all spheres due to pain.  Skin: Port wine stain right face. Warm, dry without rashes, lesions, ecchymosis. Neuro: Cranial nerves intact, reflexes equal bilaterally. Normal muscle tone, no cerebellar symptoms. Sensation intact.  Psych: Awake and oriented X 3, normal affect, Insight and Judgment appropriate.   EKG: WNL, IRBBB, no ST changes. AORTA SCAN: defer   Vicie Mutters 9:30 AM [facility]

## 2015-07-18 LAB — LIPID PANEL
Cholesterol: 193 mg/dL (ref 125–200)
HDL: 66 mg/dL (ref >=46)
LDL Cholesterol: 100 mg/dL (ref <130)
Total CHOL/HDL Ratio: 2.9 Ratio (ref <=5.0)
Triglycerides: 137 mg/dL (ref <150)
VLDL: 27 mg/dL (ref <30)

## 2015-07-18 LAB — MICROALBUMIN / CREATININE URINE RATIO
Creatinine, Urine: 223 mg/dL (ref 20–320)
Microalb Creat Ratio: 14 mcg/mg creat (ref <30)
Microalb, Ur: 3.1 mg/dL

## 2015-07-18 LAB — HEPATIC FUNCTION PANEL
ALT: 28 U/L (ref 6–29)
AST: 25 U/L (ref 10–35)
Albumin: 4 g/dL (ref 3.6–5.1)
Alkaline Phosphatase: 83 U/L (ref 33–130)
Bilirubin, Direct: 0.1 mg/dL (ref <=0.2)
Indirect Bilirubin: 0.3 mg/dL (ref 0.2–1.2)
Total Bilirubin: 0.4 mg/dL (ref 0.2–1.2)
Total Protein: 7.6 g/dL (ref 6.1–8.1)

## 2015-07-18 LAB — BASIC METABOLIC PANEL WITH GFR
BUN: 17 mg/dL (ref 7–25)
CO2: 25 mmol/L (ref 20–31)
Calcium: 9.4 mg/dL (ref 8.6–10.4)
Chloride: 98 mmol/L (ref 98–110)
Creat: 0.88 mg/dL (ref 0.50–1.05)
GFR, Est African American: 84 mL/min (ref >=60)
GFR, Est Non African American: 73 mL/min (ref >=60)
Glucose, Bld: 90 mg/dL (ref 65–99)
Potassium: 4.1 mmol/L (ref 3.5–5.3)
Sodium: 141 mmol/L (ref 135–146)

## 2015-07-18 LAB — URINALYSIS, ROUTINE W REFLEX MICROSCOPIC
Bilirubin Urine: NEGATIVE
Glucose, UA: NEGATIVE
Hgb urine dipstick: NEGATIVE
Ketones, ur: NEGATIVE
Leukocytes, UA: NEGATIVE
Nitrite: NEGATIVE
Protein, ur: NEGATIVE
Specific Gravity, Urine: 1.02 (ref 1.001–1.035)
pH: 6.5 (ref 5.0–8.0)

## 2015-07-18 LAB — INSULIN, FASTING: Insulin fasting, serum: 14.1 u[IU]/mL (ref 2.0–19.6)

## 2015-07-18 LAB — FERRITIN: Ferritin: 17 ng/mL (ref 10–291)

## 2015-07-18 LAB — IRON AND TIBC
%SAT: 13 % (ref 11–50)
Iron: 56 ug/dL (ref 45–160)
TIBC: 429 ug/dL (ref 250–450)
UIBC: 373 ug/dL (ref 125–400)

## 2015-07-18 LAB — TSH: TSH: 2.57 u[IU]/mL (ref 0.350–4.500)

## 2015-07-18 LAB — MAGNESIUM: Magnesium: 2 mg/dL (ref 1.5–2.5)

## 2015-07-18 LAB — VITAMIN B12: Vitamin B-12: 594 pg/mL (ref 211–911)

## 2015-07-30 ENCOUNTER — Other Ambulatory Visit: Payer: Self-pay | Admitting: Internal Medicine

## 2015-07-30 DIAGNOSIS — G44039 Episodic paroxysmal hemicrania, not intractable: Secondary | ICD-10-CM

## 2015-08-01 ENCOUNTER — Other Ambulatory Visit: Payer: Self-pay | Admitting: Radiology

## 2015-08-23 ENCOUNTER — Telehealth: Payer: Self-pay | Admitting: *Deleted

## 2015-08-23 MED ORDER — AZITHROMYCIN 250 MG PO TABS: ORAL_TABLET | ORAL | Status: AC

## 2015-08-23 NOTE — Telephone Encounter (Signed)
Patient called and states she still has a cough from OV 3 weeks ago.  Per Dr Melford Aase, send in a Z-pak.

## 2015-08-30 ENCOUNTER — Other Ambulatory Visit: Payer: Self-pay | Admitting: Internal Medicine

## 2015-09-07 ENCOUNTER — Other Ambulatory Visit: Payer: Self-pay | Admitting: Internal Medicine

## 2015-09-07 DIAGNOSIS — J0410 Acute tracheitis without obstruction: Secondary | ICD-10-CM

## 2015-09-07 MED ORDER — BENZONATATE 200 MG PO CAPS: 200.0000 mg | ORAL_CAPSULE | Freq: Three times a day (TID) | ORAL | Status: DC | PRN

## 2015-09-07 MED ORDER — PREDNISONE 20 MG PO TABS: ORAL_TABLET | ORAL | Status: DC

## 2015-09-07 MED ORDER — PROMETHAZINE-DM 6.25-15 MG/5ML PO SYRP: ORAL_SOLUTION | ORAL | Status: DC

## 2015-09-14 ENCOUNTER — Ambulatory Visit: Payer: 59 | Admitting: Adult Health

## 2015-09-19 ENCOUNTER — Ambulatory Visit (INDEPENDENT_AMBULATORY_CARE_PROVIDER_SITE_OTHER): Payer: 59 | Admitting: Adult Health

## 2015-09-19 ENCOUNTER — Encounter: Payer: Self-pay | Admitting: Adult Health

## 2015-09-19 VITALS — BP 112/79 | HR 74 | Resp 20 | Ht 64.5 in | Wt 212.0 lb

## 2015-09-19 DIAGNOSIS — G4733 Obstructive sleep apnea (adult) (pediatric): Secondary | ICD-10-CM | POA: Diagnosis not present

## 2015-09-19 DIAGNOSIS — M542 Cervicalgia: Secondary | ICD-10-CM

## 2015-09-19 DIAGNOSIS — Z9989 Dependence on other enabling machines and devices: Secondary | ICD-10-CM

## 2015-09-19 MED ORDER — AMITRIPTYLINE HCL 10 MG PO TABS: 20.0000 mg | ORAL_TABLET | Freq: Every day | ORAL | Status: DC

## 2015-09-19 NOTE — Progress Notes (Signed)
I agree with the assessment and plan as directed by NP .The patient is known to me .   Phillips Goulette, MD  

## 2015-09-19 NOTE — Progress Notes (Signed)
PATIENT: Kristina Webb DOB: Feb 08, 1956  REASON FOR VISIT: follow up- obstructive sleep apnea, headaches HISTORY FROM: patient  HISTORY OF PRESENT ILLNESS: Kristina Webb is a 60 year old female with a history of obstructive sleep apnea as well as headaches. She returns today for follow-up. The patient states that she has not been using her CPAP due to bronchitis. She states that she bronchitis and November and was given a prednisone Dosepak as well as a Z-Pak. She states that the bronchitis continued into December she was given another Z-Pak but her symptoms did not resolve. She was given prednisone in January however she did not take this. She states that it is hard to use the CPAP with bronchitis. Patient states that her headache frequency has been increasing. She's been having 2-3 headaches a month. Her headaches normally start in the middle the night and originate from the neck to the right side of the head. She states that the headaches can be severe and describes it as a pounding sensation in the temporal region. She does take Imitrex however does not offer much benefit. She states that her headaches can last 3 hours or an entire day. The patient is currently on Elavil 10 mg at bedtime. She reports that she knows this helps with her sleep unsure if it offers any benefit for the headaches. The patient is also on Flexeril however she does not take this consistently. She denies any new neurological symptoms. She returns today for an evaluation.   HISTORY 06/14/15: Kristina Webb is a 60 y.o. female , seen here as a referral from Dr. Melford Aase for a new sleep evaluation. She is a former Dr Erling Cruz and Dr. Estella Husk patient with [name].  Kristina Webb was referred by Dr. 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  Estella Husk , when he worked her up for headaches and neck pain. The patient also reported now return of her frequent headaches. She was referred for witnessed snoring and arousals from snoring and had endorsed the Epworth sleepiness score at the time at 10 points. Her BMI was 39. She was diagnosed with an AHI of 20.8 RDI 24.5 oxygen nadir was 78% the lowest in supine REM sleep. In frequent periodic limb movements were noted. The patient returned for CPAP titration on 04-17-11 , and the choice of interface was a great challenge due to [place] condition. She was titrated to 13 cm water and a comfort gel medium size full facemask was used. I had instructed the technician not to use a full face mask and be changed later to a nasal pillow after our visit. Kristina Webb reports that due to the [employer] there was intolerance to any facial interface- she stopped using her CPAP about a year ago, she saw Dr. Augustina Mood DDS , who explained to her that only only mild apnea without significant oxygen desaturation is usually applicable to the dental treatment.  Her husband has reported witnessing apneas on a very frequent basis and hears her snoring loudly, too.  Her Epworth sleepiness score today is endorsed at 5 points and her fatigue severity at 43 points. Fatigue definitely is higher than normal. She also reports that she notice a difference since not using CPAP she felt better refreshed had no longer nocturia when she was on  CPAP and had less headaches and neck pain. All the symptoms have returned since her discontinuation of the therapy. I still think 4 years ago her residual AHI was 5.3. Please note that the patient had CSF testing which was negative for IgG and oligoclonal, on 06-13-2006.  She also had an MRI study of the brain and cervical spine on 12-13-10 and previously on 05-08-2006.  06-14-15; I see Kristina Webb today for revisit. The patient had undergone a sleep study in 2012 and at all seen a dentist  to recommended a possible dental treatment for apnea and snoring but the patient was unable at the time to afford it. We also discussed our last visit that I would like to repeat a sleep study but this is right now also outside of her financial possibility. An MRI of the cervical spine for her history of transverse myelitis was obtained and compared to a study from 2008. This new study was obtained on 05-05-15 and it showed broad disc bulging and some bone spurring moderate left foraminal narrowing but no nerve root impingement at C5 and C6. Within the spinal cord was still seen adjacent to the C2 vertebral body at T2 hyperintense focus measuring about 14 mm length , and the spinal cord was slightly expanded at the level. No bony injury seen She also underwent a CT of the head which showed mild generalized cortical atrophy, no abnormal calcifications are noted. Mild lateral ventricle enlargement. Dr. Felecia Shelling .    REVIEW OF SYSTEMS: Out of a complete 14 system review of symptoms, the patient complains only of the following symptoms, and all other reviewed systems are negative.  Heat intolerance, excessive eating, flushing, environmental allergies, joint pain, back pain, aching muscles, neck pain, neck stiffness, restless leg, frequent waking, cough  ALLERGIES: Allergies  Allergen Reactions  . Doxycycline   . Levaquin [Levofloxacin In D5w]   . Prednisone     HOME MEDICATIONS: Outpatient Prescriptions Prior to Visit  Medication Sig Dispense Refill  . albuterol (PROAIR HFA) 108 (90 BASE) MCG/ACT inhaler Inhale 2 puffs into the lungs every 6 (six) hours as needed for wheezing.    . benzonatate (TESSALON) 200 MG capsule Take 1 capsule (200 mg total) by mouth 3 (three) times daily as needed for cough. 90 capsule 1  . budesonide-formoterol (SYMBICORT) 80-4.5 MCG/ACT inhaler Inhale 2 puffs into the lungs 2 (two) times daily.    . chlorpheniramine (CHLOR-TRIMETON) 4 MG tablet Take 4 mg by mouth every 6  (six) hours as needed for allergies.    . Cyanocobalamin (VITAMIN B 12 PO) Take 1 tablet by mouth daily.    . cyclobenzaprine (FLEXERIL) 10 MG tablet Take 1/2 to 1 tablet 2 to 3 x day if needed for muscle spasm 90 tablet 1  . diclofenac (FLECTOR) 1.3 % PTCH Place 1 patch onto the skin every morning. 30 patch 3  . furosemide (LASIX) 40 MG tablet TAKE 1 TABLET EVERY MORNING FOR BLOOD PRESSURE AND FLUID 90 tablet 1  . levothyroxine (SYNTHROID, LEVOTHROID) 75 MCG tablet TAKE 1 TABLET DAILY 90 tablet 3  . losartan (COZAAR) 100 MG tablet TAKE 1 TABLET DAILY 90 tablet 3  . metFORMIN (GLUCOPHAGE-XR) 500 MG 24 hr tablet TAKE 1 TABLET BY MOUTH TWICE DAILY WITH FOOD 180 tablet 1  . Multiple Vitamins-Minerals (ZINC PO) Take 1 tablet by mouth daily.    . NUCYNTA 50 MG TABS tablet TAKE 1 TABLET 4 TIMES A DAY  0  . pantoprazole (PROTONIX) 40 MG tablet  TAKE 1 TABLET DAILY 90 tablet 3  . phentermine (ADIPEX-P) 37.5 MG tablet Take 1/2 to 1 tablet daily for dieting and weight loss 30 tablet 2  . potassium chloride SA (K-DUR,KLOR-CON) 20 MEQ tablet TAKE 1 TABLET DAILY 90 tablet 1  . predniSONE (DELTASONE) 20 MG tablet 1 tab 3 x day for 3 days, then 1 tab 2 x day for 3 days, then 1 tab 1 x day for 5 days 20 tablet 0  . promethazine-dextromethorphan (PROMETHAZINE-DM) 6.25-15 MG/5ML syrup Take 1 to 2 tsp enery 4 hours if needed for cough 360 mL 1  . sertraline (ZOLOFT) 100 MG tablet Take 1-2 tablets daily for anxiety 180 tablet 3  . SUMAtriptan (IMITREX) 100 MG tablet Take 1 tablet (100 mg total) by mouth once as needed for migraine. May repeat in 2 hours if headache persists or recurs. 10 tablet 2  . amitriptyline (ELAVIL) 10 MG tablet Take 1 tablet (10 mg total) by mouth at bedtime. 90 tablet 3   No facility-administered medications prior to visit.    PAST MEDICAL HISTORY: Past Medical History  Diagnosis Date  . Hypertension   . GERD (gastroesophageal reflux disease)   . Deviated septum   . Anemia   . Asthma    . Anxiety   . Depression   . OSA (obstructive sleep apnea)   . Hyperlipemia   . Diabetes ([place])     PAST SURGICAL HISTORY: Past Surgical History  Procedure Laterality Date  . Back surgery      FAMILY HISTORY: Family History  Problem Relation Age of Onset  . Emphysema Mother     smoked  . Allergies Mother   . Asthma Mother   . Heart disease Mother   . Breast cancer Mother     with mets to Bone  . Prostate cancer Father   . Brain cancer Father     SOCIAL HISTORY: Social History   Social History  . Marital Status: Married    Spouse Name: N/A  . Number of Children: 0  . Years of Education: N/A   Occupational History  . merchandise services    Social History Main Topics  . Smoking status: Never Smoker   . Smokeless tobacco: Never Used  . Alcohol Use: No  . Drug Use: No  . Sexual Activity: Not on file   Other Topics Concern  . Not on file   Social History Narrative   Drinks about 1/2 can soda daily.      PHYSICAL EXAM  Filed Vitals:   09/19/15 0730  BP: 112/79  Pulse: 74  Resp: 20  Height: 5' 4.5" (1.638 m)  Weight: 212 lb (96.163 kg)   Body mass index is 35.84 kg/(m^2).  Generalized: Well developed, in no acute distress   Neurological examination  Mentation: Alert oriented to time, place, history taking. Follows all commands speech and language fluent Cranial nerve II-XII: Pupils were equal round reactive to light. Extraocular movements were full, visual field were full on confrontational test. Facial sensation and strength were normal. Uvula tongue midline. Head turning and shoulder shrug  were normal and symmetric. Motor: The motor testing reveals 5 over 5 strength of all 4 extremities. Good symmetric motor tone is noted throughout.  Sensory: Sensory testing is intact to soft touch on all 4 extremities. No evidence of extinction is noted.  Coordination: Cerebellar testing reveals good finger-nose-finger and heel-to-shin bilaterally.  Gait and  station: Gait is normal. Tandem gait is normal. Romberg is negative. No  drift is seen.  Reflexes: Deep tendon reflexes are symmetric and normal bilaterally.   DIAGNOSTIC DATA (LABS, IMAGING, TESTING) - I reviewed patient records, labs, notes, testing and imaging myself where available.  Lab Results  Component Value Date   WBC 9.8 07/17/2015   HGB 13.0 07/17/2015   HCT 40.5 07/17/2015   MCV 82.8 07/17/2015   PLT 288 07/17/2015      Component Value Date/Time   NA 141 07/17/2015 1016   K 4.1 07/17/2015 1016   CL 98 07/17/2015 1016   CO2 25 07/17/2015 1016   GLUCOSE 90 07/17/2015 1016   BUN 17 07/17/2015 1016   CREATININE 0.88 07/17/2015 1016   CREATININE 1.08 08/05/2007 1400   CALCIUM 9.4 07/17/2015 1016   PROT 7.6 07/17/2015 1016   ALBUMIN 4.0 07/17/2015 1016   AST 25 07/17/2015 1016   ALT 28 07/17/2015 1016   ALKPHOS 83 07/17/2015 1016   BILITOT 0.4 07/17/2015 1016   GFRNONAA 73 07/17/2015 1016   GFRNONAA 54* 08/05/2007 1400   GFRAA 84 07/17/2015 1016   GFRAA  08/05/2007 1400    >60        The eGFR has been calculated using the MDRD equation. This calculation has not been validated in all clinical   Lab Results  Component Value Date   CHOL 193 07/17/2015   HDL 66 07/17/2015   LDLCALC 100 07/17/2015   TRIG 137 07/17/2015   CHOLHDL 2.9 07/17/2015   Lab Results  Component Value Date   HGBA1C 6.1* 07/17/2015   Lab Results  Component Value Date   [id number] 786 07/17/2015   Lab Results  Component Value Date   TSH 2.570 07/17/2015      ASSESSMENT AND PLAN 60 y.o. year old female  has a past medical history of Hypertension; GERD (gastroesophageal reflux disease); Deviated septum; Anemia; Asthma; Anxiety; Depression; OSA (obstructive sleep apnea); Hyperlipemia; and Diabetes ([place]). here with:  1. Obstructive sleep apnea on CPAP 2. Headaches  The patient has not been using her CPAP due to bronchitis. I have encouraged the patient to begin using her CPAP  nightly. The patient headache frequency has increased. I will increase her amitriptyline to 2 tablets for a total of 20 mg at bedtime. Patient advised that if her headache frequency or severity does not improve she should let us know. She will follow-up in 3 months with Dr. Mechele Claude, MSN, NP-C 09/19/2015, 8:06 AM [facility] [address] [place], [place] [address] [phone]

## 2015-09-19 NOTE — Patient Instructions (Signed)
Begin using the CPAP nightly Increase amitriptyline to 20 mg at bedtime. If this makes you consistently sleepy the next morning let me know If your symptoms worsen or you develop new symptoms please let us know.

## 2015-09-27 ENCOUNTER — Other Ambulatory Visit: Payer: Self-pay | Admitting: Internal Medicine

## 2015-10-14 ENCOUNTER — Encounter: Payer: Self-pay | Admitting: *Deleted

## 2015-10-17 ENCOUNTER — Ambulatory Visit (INDEPENDENT_AMBULATORY_CARE_PROVIDER_SITE_OTHER): Payer: 59 | Admitting: Physician Assistant

## 2015-10-17 ENCOUNTER — Encounter: Payer: Self-pay | Admitting: Physician Assistant

## 2015-10-17 VITALS — BP 128/76 | HR 96 | Temp 97.3°F | Resp 16 | Ht 64.5 in | Wt 208.4 lb

## 2015-10-17 DIAGNOSIS — E119 Type 2 diabetes mellitus without complications: Secondary | ICD-10-CM

## 2015-10-17 DIAGNOSIS — N182 Chronic kidney disease, stage 2 (mild): Secondary | ICD-10-CM | POA: Diagnosis not present

## 2015-10-17 DIAGNOSIS — R05 Cough: Secondary | ICD-10-CM | POA: Diagnosis not present

## 2015-10-17 DIAGNOSIS — E785 Hyperlipidemia, unspecified: Secondary | ICD-10-CM | POA: Diagnosis not present

## 2015-10-17 DIAGNOSIS — E559 Vitamin D deficiency, unspecified: Secondary | ICD-10-CM | POA: Diagnosis not present

## 2015-10-17 DIAGNOSIS — J0410 Acute tracheitis without obstruction: Secondary | ICD-10-CM | POA: Diagnosis not present

## 2015-10-17 DIAGNOSIS — G4733 Obstructive sleep apnea (adult) (pediatric): Secondary | ICD-10-CM | POA: Diagnosis not present

## 2015-10-17 DIAGNOSIS — Z79899 Other long term (current) drug therapy: Secondary | ICD-10-CM | POA: Diagnosis not present

## 2015-10-17 DIAGNOSIS — R059 Cough, unspecified: Secondary | ICD-10-CM

## 2015-10-17 DIAGNOSIS — I10 Essential (primary) hypertension: Secondary | ICD-10-CM

## 2015-10-17 DIAGNOSIS — J4520 Mild intermittent asthma, uncomplicated: Secondary | ICD-10-CM

## 2015-10-17 MED ORDER — MONTELUKAST SODIUM 10 MG PO TABS: 10.0000 mg | ORAL_TABLET | Freq: Every day | ORAL | Status: DC

## 2015-10-17 MED ORDER — BENZONATATE 200 MG PO CAPS: 200.0000 mg | ORAL_CAPSULE | Freq: Three times a day (TID) | ORAL | Status: DC | PRN

## 2015-10-17 MED ORDER — RANITIDINE HCL 300 MG PO TABS: ORAL_TABLET | ORAL | Status: DC

## 2015-10-17 NOTE — Patient Instructions (Addendum)
I'm going to send in Alexander OFF OF PPI's    Nexium/protonix/prilosec/Omeprazole/Dexilant/Aciphex are called PPI's, they are great at healing your stomach but should only be taken for a short period of time.     Recent studies have shown that taken for a long time they  can increase the risk of osteoporosis (weakening of your bones), pneumonia, low magnesium, restless legs, Cdiff (infection that causes diarrhea), DEMENTIA and most recently kidney damage / disease / insufficiency.     Due to this information we want to try to stop the PPI but if you try to stop it abruptly this can cause rebound acid and worsening symptoms.   So this is how we want you to get off the PPI:  - Start taking the nexium/protonix/prilosec/PPI  every other day with  zantac (ranitidine) 2 x a day for 2-4 weeks  - then decrease the PPI to every 3 days while taking the zantac (ranitidine) twice a day the other  days for 2-4  Weeks  - then you can try the zantac (ranitidine) once at night or up to 2 x day as needed.  - you can continue on this once at night or stop all together  - Avoid alcohol, spicy foods, NSAIDS (aleve, ibuprofen) at this time. See foods below.   +++++++++++++++++++++++++++++++++++++++++++  Food Choices for Gastroesophageal Reflux Disease  When you have gastroesophageal reflux disease (GERD), the foods you eat and your eating habits are very important. Choosing the right foods can help ease the discomfort of GERD. WHAT GENERAL GUIDELINES DO I NEED TO FOLLOW?  Choose fruits, vegetables, whole grains, low-fat dairy products, and low-fat meat, fish, and poultry.  Limit fats such as oils, salad dressings, butter, nuts, and avocado.  Keep a food diary to identify foods that cause symptoms.  Avoid foods that cause reflux. These may be different for different people.  Eat frequent small meals instead of three large meals each day.  Eat your meals slowly, in a relaxed setting.  Limit  fried foods.  Cook foods using methods other than frying.  Avoid drinking alcohol.  Avoid drinking large amounts of liquids with your meals.  Avoid bending over or lying down until 2-3 hours after eating.   WHAT FOODS ARE NOT RECOMMENDED? The following are some foods and drinks that may worsen your symptoms:  Vegetables Tomatoes. Tomato juice. Tomato and spaghetti sauce. Chili peppers. Onion and garlic. Horseradish. Fruits Oranges, grapefruit, and lemon (fruit and juice). Meats High-fat meats, fish, and poultry. This includes hot dogs, ribs, ham, sausage, salami, and bacon. Dairy Whole milk and chocolate milk. Sour cream. Cream. Butter. Ice cream. Cream cheese.  Beverages Coffee and tea, with or without caffeine. Carbonated beverages or energy drinks. Condiments Hot sauce. Barbecue sauce.  Sweets/Desserts Chocolate and cocoa. Donuts. Peppermint and spearmint. Fats and Oils High-fat foods, including [place] fries and potato chips. Other Vinegar. Strong spices, such as black pepper, white pepper, red pepper, cayenne, curry powder, cloves, ginger, and chili powder. Nexium/protonix/prilosec are called PPI's, they are great at healing your stomach but should only be taken for a short period of time.   Varicose Veins Varicose veins are veins that have become enlarged and twisted. CAUSES This condition is the result of valves in the veins not working properly. Valves in the veins help return blood from the leg to the heart. When your calf muscles squeeze, the blood moves up your leg then the valves close and this continues until the blood gets back to  your heart.  If these valves are damaged, blood flows backwards and backs up into the veins in the leg near the skin OR if your are sitting/standing for a long time without using your calf muscles the blood will back up into the veins in your legs. This causes the veins to become larger. People who are on their feet a lot, sit a lot without  walking (like on a plane, at a desk, or in a car), who are pregnant, or who are overweight are more likely to develop varicose veins. SYMPTOMS   Bulging, twisted-appearing, bluish veins, most commonly found on the legs.  Leg pain or a feeling of heaviness. These symptoms may be worse at the end of the day.  Leg swelling.  Skin color changes. DIAGNOSIS  Varicose veins can usually be diagnosed with an exam of your legs by your caregiver. He or she may recommend an ultrasound of your leg veins. TREATMENT  Most varicose veins can be treated at home.However, other treatments are available for people who have persistent symptoms or who want to treat the cosmetic appearance of the varicose veins. But this is only cosmetic and they will return if not properly treated. These include:  Laser treatment of very small varicose veins.  Medicine that is shot (injected) into the vein. This medicine hardens the walls of the vein and closes off the vein. This treatment is called sclerotherapy. Afterwards, you may need to wear clothing or bandages that apply pressure.  Surgery. HOME CARE INSTRUCTIONS   Do not stand or sit in one position for long periods of time. Do not sit with your legs crossed. Rest with your legs raised during the day.  Your legs have to be higher than your heart so that gravity will force the valves to open, so please really elevate your legs.   Wear elastic stockings or support hose. Do not wear other tight, encircling garments around the legs, pelvis, or waist.  [employer]  has a wide variety of well priced compression stockings. [place], [employer] [id number] [id number] [place] ARE COPPER INFUSED COMPRESSION SOCKS AT [facility] OR [facility]  Walk as much as possible to increase blood flow.  Raise the foot of your bed at night with 2-inch blocks.  If you get a cut in the skin over the vein and the vein bleeds, lie down with your leg raised and press on it with a  clean cloth until the bleeding stops. Then place a bandage (dressing) on the cut. See your caregiver if it continues to bleed or needs stitches. SEEK MEDICAL CARE IF:   The skin around your ankle starts to break down.  You have pain, redness, tenderness, or hard swelling developing in your leg over a vein.  You are uncomfortable due to leg pain. Document Released: 05/22/2005 Document Revised: 11/04/2011 Document Reviewed: 10/08/2010 [facility] Patient Information 2014 [place].

## 2015-10-17 NOTE — Progress Notes (Signed)
Assessment and Plan:  1. Essential hypertension - CBC with Differential/Platelet - Hepatic function panel - BASIC METABOLIC PANEL WITH GFR - TSH  2. Type 2 diabetes mellitus without complication, without long-term current use of insulin (HCC) - Hemoglobin A1c  3. CKD (chronic kidney disease) stage 2, GFR 60-89 ml/min - BASIC METABOLIC PANEL WITH GFR  4. Hyperlipemia - Lipid panel  5. OSA (obstructive sleep apnea)  6. Asthma, mild intermittent, uncomplicated  7. Morbid obesity, unspecified obesity type ([place])  8. Cough Add singulair - benzonatate (TESSALON) 200 MG capsule; Take 1 capsule (200 mg total) by mouth 3 (three) times daily as needed for cough.  Dispense: 90 capsule; Refill: 1  10. Medication management - Magnesium  11. Vitamin D deficiency - VITAMIN D 25 Hydroxy (Vit-D Deficiency, Fractures)  Continue diet and meds as discussed. Further disposition pending results of labs.  HPI 60 y.o. female  presents for 3 month follow up with hypertension, hyperlipidemia, prediabetes and vitamin D.  Her blood pressure has been controlled at home, today their BP is BP: 128/76 mmHg  She does workout, walks and wears her fitbit.  She denies chest pain, shortness of breath, dizziness. No more dizziness since cutting her lasix in half, will occ take an extra.  Has had cough x nov, has history of asthma, has been on singulair in the past that helped.   She is not on cholesterol medication and denies myalgias. Her cholesterol is not at goal. The cholesterol last visit was:   Lab Results  Component Value Date   CHOL 193 07/17/2015   HDL 66 07/17/2015   LDLCALC 100 07/17/2015   TRIG 137 07/17/2015   CHOLHDL 2.9 07/17/2015    She has been working on diet and exercise for prediabetes, and denies paresthesia of the feet, polydipsia, polyuria and visual disturbances. Last A1C in the office was:  Lab Results  Component Value Date   HGBA1C 6.1* 07/17/2015  Patient is on Vitamin D  supplement.   Lab Results  Component Value Date   VD25OH 30 01/25/2015    BMI is Body mass index is 35.23 kg/(m^2)., she is working on diet and exercise. She has been following with Dr. Brett Fairy for OSA/migraines.   Wt Readings from Last 3 Encounters:  10/17/15 208 lb 6.4 oz (94.53 kg)  09/19/15 212 lb (96.163 kg)  07/17/15 202 lb (91.627 kg)   She is on thyroid medication. Her medication was not changed last visit.   Lab Results  Component Value Date   TSH 2.570 07/17/2015  She has history of lumbar back surgery over 17 years ago with fusion L4-L5, has had injection with Dr. Vira Blanco.   Current Medications:  Current Outpatient Prescriptions on File Prior to Visit  Medication Sig Dispense Refill  . albuterol (PROAIR HFA) 108 (90 BASE) MCG/ACT inhaler Inhale 2 puffs into the lungs every 6 (six) hours as needed for wheezing.    Marland Kitchen amitriptyline (ELAVIL) 10 MG tablet Take 2 tablets (20 mg total) by mouth at bedtime. 60 tablet 3  . benzonatate (TESSALON) 200 MG capsule Take 1 capsule (200 mg total) by mouth 3 (three) times daily as needed for cough. 90 capsule 1  . budesonide-formoterol (SYMBICORT) 80-4.5 MCG/ACT inhaler Inhale 2 puffs into the lungs 2 (two) times daily.    . chlorpheniramine (CHLOR-TRIMETON) 4 MG tablet Take 4 mg by mouth every 6 (six) hours as needed for allergies.    . Cyanocobalamin (VITAMIN B 12 PO) Take 1 tablet by mouth daily.    Marland Kitchen  cyclobenzaprine (FLEXERIL) 10 MG tablet Take 1/2 to 1 tablet 2 to 3 x day if needed for muscle spasm 90 tablet 1  . diclofenac (FLECTOR) 1.3 % PTCH Place 1 patch onto the skin every morning. 30 patch 3  . furosemide (LASIX) 40 MG tablet TAKE 1 TABLET EVERY MORNING FOR BLOOD PRESSURE AND FLUID 90 tablet 1  . levothyroxine (SYNTHROID, LEVOTHROID) 75 MCG tablet TAKE 1 TABLET DAILY 90 tablet 2  . losartan (COZAAR) 100 MG tablet TAKE 1 TABLET DAILY 90 tablet 3  . metFORMIN (GLUCOPHAGE-XR) 500 MG 24 hr tablet TAKE 1 TABLET BY MOUTH TWICE DAILY WITH  FOOD 180 tablet 1  . Multiple Vitamins-Minerals (ZINC PO) Take 1 tablet by mouth daily.    . NUCYNTA 50 MG TABS tablet TAKE 1 TABLET 4 TIMES A DAY  0  . pantoprazole (PROTONIX) 40 MG tablet TAKE 1 TABLET DAILY 90 tablet 3  . phentermine (ADIPEX-P) 37.5 MG tablet Take 1/2 to 1 tablet daily for dieting and weight loss 30 tablet 2  . potassium chloride SA (K-DUR,KLOR-CON) 20 MEQ tablet TAKE 1 TABLET DAILY 90 tablet 1  . sertraline (ZOLOFT) 100 MG tablet Take 1-2 tablets daily for anxiety 180 tablet 3  . SUMAtriptan (IMITREX) 100 MG tablet Take 1 tablet (100 mg total) by mouth once as needed for migraine. May repeat in 2 hours if headache persists or recurs. 10 tablet 2   No current facility-administered medications on file prior to visit.   Medical History:  Past Medical History  Diagnosis Date  . Hypertension   . GERD (gastroesophageal reflux disease)   . Deviated septum   . Anemia   . Asthma   . Anxiety   . Depression   . OSA (obstructive sleep apnea)   . Hyperlipemia   . Diabetes ([place])    Allergies:  Allergies  Allergen Reactions  . Doxycycline   . Levaquin [Levofloxacin In D5w]   . Prednisone     Review of Systems:  Review of Systems  Constitutional: Positive for malaise/fatigue. Negative for fever, chills, weight loss and diaphoresis.  HENT: Negative for congestion, ear discharge, ear pain, hearing loss, nosebleeds, sore throat and tinnitus.   Eyes: Negative.   Respiratory: Negative.  Negative for stridor.   Cardiovascular: Negative.   Gastrointestinal: Positive for constipation. Negative for heartburn, nausea, vomiting, abdominal pain, diarrhea, blood in stool and melena.  Genitourinary: Negative.   Musculoskeletal: Positive for back pain. Negative for myalgias, joint pain, falls and neck pain.  Skin: Negative.   Neurological: Positive for tingling (right leg) and headaches. Negative for dizziness, tremors, sensory change, speech change, focal weakness, seizures, loss of  consciousness and weakness.  Psychiatric/Behavioral: Positive for depression and memory loss. Negative for suicidal ideas, hallucinations and substance abuse. The patient has insomnia. The patient is not nervous/anxious.     Family history- Review and unchanged Social history- Review and unchanged Physical Exam: BP 128/76 mmHg  Pulse 96  Temp(Src) 97.3 F (36.3 C) (Temporal)  Resp 16  Ht 5' 4.5" (1.638 m)  Wt 208 lb 6.4 oz (94.53 kg)  BMI 35.23 kg/m2  SpO2 97% Wt Readings from Last 3 Encounters:  10/17/15 208 lb 6.4 oz (94.53 kg)  09/19/15 212 lb (96.163 kg)  07/17/15 202 lb (91.627 kg)   General Appearance: Well nourished, in no apparent distress. Eyes: PERRLA, EOMs, conjunctiva no swelling or erythema Sinuses: No Frontal/maxillary tenderness ENT/Mouth: Ext aud canals clear, TMs without erythema, bulging. No erythema, swelling, or exudate on post  pharynx.  Tonsils not swollen or erythematous. Hearing normal.  Neck: Supple, thyroid normal.  Respiratory: Respiratory effort normal, BS equal bilaterally without rales, rhonchi, wheezing or stridor.  Cardio: RRR with no MRGs. Brisk peripheral pulses without edema.  Abdomen: Soft, + BS,  Non tender, no guarding, rebound, hernias, masses. Lymphatics: Non tender without lymphadenopathy.  Musculoskeletal: Full ROM, 5/5 strength, Normal gait,  Skin: Port wine stain right face. Warm, dry without rashes, lesions, ecchymosis.  Neuro: Cranial nerves intact. Normal muscle tone, no cerebellar symptoms. Psych: Awake and oriented X 3, normal affect, Insight and Judgment appropriate.    Vicie Mutters, PA-C 4:40 PM [facility]

## 2015-10-18 LAB — HEPATIC FUNCTION PANEL
ALT: 47 U/L — ABNORMAL HIGH (ref 6–29)
AST: 34 U/L (ref 10–35)
Albumin: 4 g/dL (ref 3.6–5.1)
Alkaline Phosphatase: 73 U/L (ref 33–130)
Bilirubin, Direct: 0.1 mg/dL (ref <=0.2)
Indirect Bilirubin: 0.2 mg/dL (ref 0.2–1.2)
Total Bilirubin: 0.3 mg/dL (ref 0.2–1.2)
Total Protein: 6.9 g/dL (ref 6.1–8.1)

## 2015-10-18 LAB — LIPID PANEL
Cholesterol: 172 mg/dL (ref 125–200)
HDL: 49 mg/dL (ref >=46)
LDL Cholesterol: 94 mg/dL (ref <130)
Total CHOL/HDL Ratio: 3.5 Ratio (ref <=5.0)
Triglycerides: 147 mg/dL (ref <150)
VLDL: 29 mg/dL (ref <30)

## 2015-10-18 LAB — CBC WITH DIFFERENTIAL/PLATELET
Basophils Absolute: 0 10*3/uL (ref 0.0–0.1)
Basophils Relative: 0 % (ref 0–1)
Eosinophils Absolute: 0.4 10*3/uL (ref 0.0–0.7)
Eosinophils Relative: 5 % (ref 0–5)
HCT: 37.4 % (ref 36.0–46.0)
Hemoglobin: 11.9 g/dL — ABNORMAL LOW (ref 12.0–15.0)
Lymphocytes Relative: 36 % (ref 12–46)
Lymphs Abs: 3.1 10*3/uL (ref 0.7–4.0)
MCH: 25.3 pg — ABNORMAL LOW (ref 26.0–34.0)
MCHC: 31.8 g/dL (ref 30.0–36.0)
MCV: 79.6 fL (ref 78.0–100.0)
MPV: 8.9 fL (ref 8.6–12.4)
Monocytes Absolute: 0.5 10*3/uL (ref 0.1–1.0)
Monocytes Relative: 6 % (ref 3–12)
Neutro Abs: 4.6 10*3/uL (ref 1.7–7.7)
Neutrophils Relative %: 53 % (ref 43–77)
Platelets: 291 10*3/uL (ref 150–400)
RBC: 4.7 MIL/uL (ref 3.87–5.11)
RDW: 14.9 % (ref 11.5–15.5)
WBC: 8.7 10*3/uL (ref 4.0–10.5)

## 2015-10-18 LAB — BASIC METABOLIC PANEL WITH GFR
BUN: 17 mg/dL (ref 7–25)
CO2: 27 mmol/L (ref 20–31)
Calcium: 9.3 mg/dL (ref 8.6–10.4)
Chloride: 102 mmol/L (ref 98–110)
Creat: 0.88 mg/dL (ref 0.50–1.05)
GFR, Est African American: 84 mL/min (ref >=60)
GFR, Est Non African American: 73 mL/min (ref >=60)
Glucose, Bld: 90 mg/dL (ref 65–99)
Potassium: 4.2 mmol/L (ref 3.5–5.3)
Sodium: 141 mmol/L (ref 135–146)

## 2015-10-18 LAB — MAGNESIUM: Magnesium: 1.8 mg/dL (ref 1.5–2.5)

## 2015-10-18 LAB — HEMOGLOBIN A1C
Hgb A1c MFr Bld: 6.5 % — ABNORMAL HIGH (ref <5.7)
Mean Plasma Glucose: 140 mg/dL — ABNORMAL HIGH (ref <117)

## 2015-10-18 LAB — VITAMIN D 25 HYDROXY (VIT D DEFICIENCY, FRACTURES): Vit D, 25-Hydroxy: 31 ng/mL (ref 30–100)

## 2015-10-18 LAB — TSH: TSH: 1.58 mIU/L

## 2015-11-16 ENCOUNTER — Other Ambulatory Visit: Payer: Self-pay | Admitting: Internal Medicine

## 2015-12-13 ENCOUNTER — Encounter: Payer: Self-pay | Admitting: Physician Assistant

## 2015-12-18 ENCOUNTER — Ambulatory Visit: Payer: 59 | Admitting: Neurology

## 2015-12-20 ENCOUNTER — Encounter: Payer: Self-pay | Admitting: Neurology

## 2016-01-01 ENCOUNTER — Other Ambulatory Visit: Payer: Self-pay | Admitting: Radiology

## 2016-01-12 ENCOUNTER — Other Ambulatory Visit: Payer: Self-pay | Admitting: General Surgery

## 2016-01-12 DIAGNOSIS — D242 Benign neoplasm of left breast: Secondary | ICD-10-CM

## 2016-01-13 ENCOUNTER — Other Ambulatory Visit: Payer: Self-pay | Admitting: Physician Assistant

## 2016-01-14 ENCOUNTER — Encounter: Payer: Self-pay | Admitting: *Deleted

## 2016-01-16 ENCOUNTER — Encounter: Payer: Self-pay | Admitting: Internal Medicine

## 2016-01-16 ENCOUNTER — Ambulatory Visit (INDEPENDENT_AMBULATORY_CARE_PROVIDER_SITE_OTHER): Payer: 59 | Admitting: Internal Medicine

## 2016-01-16 VITALS — BP 126/80 | HR 74 | Temp 98.2°F | Resp 18 | Ht 64.5 in | Wt 204.0 lb

## 2016-01-16 DIAGNOSIS — E785 Hyperlipidemia, unspecified: Secondary | ICD-10-CM

## 2016-01-16 DIAGNOSIS — E559 Vitamin D deficiency, unspecified: Secondary | ICD-10-CM | POA: Diagnosis not present

## 2016-01-16 DIAGNOSIS — Z79899 Other long term (current) drug therapy: Secondary | ICD-10-CM | POA: Diagnosis not present

## 2016-01-16 DIAGNOSIS — E119 Type 2 diabetes mellitus without complications: Secondary | ICD-10-CM

## 2016-01-16 DIAGNOSIS — G4733 Obstructive sleep apnea (adult) (pediatric): Secondary | ICD-10-CM

## 2016-01-16 DIAGNOSIS — I10 Essential (primary) hypertension: Secondary | ICD-10-CM

## 2016-01-16 NOTE — Progress Notes (Signed)
Assessment and Plan:  Hypertension:  -Continue medication,  -monitor blood pressure at home.  -Continue DASH diet.   -Reminder to go to the ER if any CP, SOB, nausea, dizziness, severe HA, changes vision/speech, left arm numbness and tingling, and jaw pain.  Cholesterol: -Continue diet and exercise.  -Check cholesterol.   Pre-diabetes: -Continue diet and exercise.  -Check A1C  Vitamin D Def: -check level -continue medications.   Papilloma -will send labs to Dr. Marlou Starks for surgical clearance -will also remind him that patient has OSA  Continue diet and meds as discussed. Further disposition pending results of labs.  HPI 60 y.o. female  presents for 3 month follow up with hypertension, hyperlipidemia, prediabetes and vitamin D.   Her blood pressure has been controlled at home, today their BP is BP: 126/80 mmHg.   She does workout. She denies chest pain, shortness of breath, dizziness.  She reports that she is actively trying to work on losing weight.  She reports that she has been off the diet pill for the last couple months.     She is on cholesterol medication and denies myalgias. Her cholesterol is at goal. The cholesterol last visit was:   Lab Results  Component Value Date   CHOL 172 10/17/2015   HDL 49 10/17/2015   LDLCALC 94 10/17/2015   TRIG 147 10/17/2015   CHOLHDL 3.5 10/17/2015     She has been working on diet and exercise for prediabetes, and denies foot ulcerations, hyperglycemia, hypoglycemia , increased appetite, nausea, paresthesia of the feet, polydipsia, polyuria, visual disturbances, vomiting and weight loss. Last A1C in the office was:  Lab Results  Component Value Date   HGBA1C 6.5* 10/17/2015    Patient is on Vitamin D supplement.  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  She reports that Dr. Marlou Starks is going to be doing the surgery.     She reports that she is trying to cut back on pantoprazole and is able to get 2 days off the medication while taking zantac on the off days.    Current Medications:  Current Outpatient Prescriptions on File Prior to Visit  Medication Sig Dispense Refill  . albuterol (PROAIR HFA) 108 (90 BASE) MCG/ACT inhaler Inhale 2 puffs into the lungs every 6 (six) hours as needed for wheezing.    Marland Kitchen amitriptyline (ELAVIL) 10 MG tablet TAKE 1 TO 2 TABLETS BY MOUTH DAILY AT BEDTIME 180 tablet 1  . budesonide-formoterol (SYMBICORT) 80-4.5 MCG/ACT inhaler Inhale 2 puffs into the lungs 2 (two) times daily.    . chlorpheniramine (CHLOR-TRIMETON) 4 MG tablet Take 4 mg by mouth every 6 (six) hours as needed for allergies.    . Cyanocobalamin (VITAMIN B 12 PO) Take 1 tablet by mouth daily.    . cyclobenzaprine (FLEXERIL) 10 MG tablet Take 1/2 to 1 tablet 2 to 3 x day if needed for muscle spasm 90 tablet 1  . diclofenac (FLECTOR) 1.3 % PTCH Place 1 patch onto the skin every morning. 30 patch 3  . furosemide (LASIX) 40 MG tablet TAKE 1 TABLET EVERY MORNING FOR BLOOD PRESSURE AND FLUID 90 tablet 1  . levothyroxine (SYNTHROID, LEVOTHROID) 75 MCG tablet TAKE 1 TABLET DAILY 90 tablet 2  . losartan (COZAAR) 100 MG tablet TAKE 1 TABLET DAILY 90 tablet 3  . metFORMIN (GLUCOPHAGE-XR) 500 MG 24  hr tablet TAKE 1 TABLET BY MOUTH TWICE DAILY WITH FOOD 180 tablet 1  . montelukast (SINGULAIR) 10 MG tablet TAKE 1 TABLET (10 MG TOTAL) BY MOUTH DAILY. 30 tablet 2  . Multiple Vitamins-Minerals (ZINC PO) Take 1 tablet by mouth daily.    . NUCYNTA 50 MG TABS tablet TAKE 1 TABLET 4 TIMES A DAY  0  . pantoprazole (PROTONIX) 40 MG tablet TAKE 1 TABLET DAILY 90 tablet 3  . potassium chloride SA (K-DUR,KLOR-CON) 20 MEQ tablet TAKE 1 TABLET DAILY 90 tablet 1  . ranitidine (ZANTAC) 300 MG tablet Take twice a day while trying to get off PPI, then can go to once at night 60 tablet 3  . sertraline (ZOLOFT) 100 MG tablet Take 1-2 tablets daily  for anxiety 180 tablet 3  . SUMAtriptan (IMITREX) 100 MG tablet Take 1 tablet (100 mg total) by mouth once as needed for migraine. May repeat in 2 hours if headache persists or recurs. 10 tablet 2   No current facility-administered medications on file prior to visit.    Medical History:  Past Medical History  Diagnosis Date  . Hypertension   . GERD (gastroesophageal reflux disease)   . Deviated septum   . Anemia   . Asthma   . Anxiety   . Depression   . OSA (obstructive sleep apnea)   . Hyperlipemia   . Diabetes ([place])     Allergies:  Allergies  Allergen Reactions  . Doxycycline   . Levaquin [Levofloxacin In D5w]   . Prednisone      Review of Systems:  Review of Systems  Constitutional: Negative for fever, chills and malaise/fatigue.  HENT: Negative for congestion, ear pain and sore throat.   Respiratory: Negative for cough, shortness of breath and wheezing.   Cardiovascular: Negative for chest pain, palpitations and leg swelling.  Gastrointestinal: Positive for heartburn and constipation. Negative for abdominal pain, diarrhea, blood in stool and melena.  Genitourinary: Negative.   Skin: Negative.   Neurological: Negative for dizziness, loss of consciousness and headaches.  Psychiatric/Behavioral: Negative for depression. The patient has insomnia. The patient is not nervous/anxious.     Family history- Review and unchanged  Social history- Review and unchanged  Physical Exam: BP 126/80 mmHg  Pulse 74  Temp(Src) 98.2 F (36.8 C) (Temporal)  Resp 18  Ht 5' 4.5" (1.638 m)  Wt 204 lb (92.534 kg)  BMI 34.49 kg/m2 Wt Readings from Last 3 Encounters:  01/16/16 204 lb (92.534 kg)  10/17/15 208 lb 6.4 oz (94.53 kg)  09/19/15 212 lb (96.163 kg)    General Appearance: Well nourished well developed, in no apparent distress. Eyes: PERRLA, EOMs, conjunctiva no swelling or erythema ENT/Mouth: Ear canals normal without obstruction, swelling, erythma, discharge.  TMs  normal bilaterally.  Oropharynx moist, clear, without exudate, or postoropharyngeal swelling. Neck: Supple, thyroid normal,no cervical adenopathy  Respiratory: Respiratory effort normal, Breath sounds clear A&P without rhonchi, wheeze, or rale.  No retractions, no accessory usage. Cardio: RRR with no MRGs. Brisk peripheral pulses without edema.  Abdomen: Soft, + BS,  Non tender, no guarding, rebound, hernias, masses. Musculoskeletal: Full ROM, 5/5 strength, Normal gait Skin: Warm, dry without rashes, lesions, ecchymosis.  Neuro: Awake and oriented X 3, Cranial nerves intact. Normal muscle tone, no cerebellar symptoms. Psych: Normal affect, Insight and Judgment appropriate.    Starlyn Skeans, PA-C 4:53 PM [facility]

## 2016-01-17 LAB — TSH: TSH: 2.67 mIU/L

## 2016-01-17 LAB — CBC WITH DIFFERENTIAL/PLATELET
Basophils Absolute: 99 cells/uL (ref 0–200)
Basophils Relative: 1 %
Eosinophils Absolute: 495 cells/uL (ref 15–500)
Eosinophils Relative: 5 %
HCT: 37 % (ref 35.0–45.0)
Hemoglobin: 11.7 g/dL (ref 11.7–15.5)
Lymphocytes Relative: 33 %
Lymphs Abs: 3267 cells/uL (ref 850–3900)
MCH: 24.8 pg — ABNORMAL LOW (ref 27.0–33.0)
MCHC: 31.6 g/dL — ABNORMAL LOW (ref 32.0–36.0)
MCV: 78.4 fL — ABNORMAL LOW (ref 80.0–100.0)
MPV: 9.3 fL (ref 7.5–12.5)
Monocytes Absolute: 693 cells/uL (ref 200–950)
Monocytes Relative: 7 %
Neutro Abs: 5346 cells/uL (ref 1500–7800)
Neutrophils Relative %: 54 %
Platelets: 289 10*3/uL (ref 140–400)
RBC: 4.72 MIL/uL (ref 3.80–5.10)
RDW: 16.4 % — ABNORMAL HIGH (ref 11.0–15.0)
WBC: 9.9 10*3/uL (ref 3.8–10.8)

## 2016-01-17 LAB — HEPATIC FUNCTION PANEL
ALT: 35 U/L — ABNORMAL HIGH (ref 6–29)
AST: 29 U/L (ref 10–35)
Albumin: 4.2 g/dL (ref 3.6–5.1)
Alkaline Phosphatase: 71 U/L (ref 33–130)
Bilirubin, Direct: 0.1 mg/dL (ref <=0.2)
Indirect Bilirubin: 0.2 mg/dL (ref 0.2–1.2)
Total Bilirubin: 0.3 mg/dL (ref 0.2–1.2)
Total Protein: 6.8 g/dL (ref 6.1–8.1)

## 2016-01-17 LAB — BASIC METABOLIC PANEL WITH GFR
BUN: 18 mg/dL (ref 7–25)
CO2: 25 mmol/L (ref 20–31)
Calcium: 9.2 mg/dL (ref 8.6–10.4)
Chloride: 104 mmol/L (ref 98–110)
Creat: 0.85 mg/dL (ref 0.50–1.05)
GFR, Est African American: 87 mL/min (ref >=60)
GFR, Est Non African American: 76 mL/min (ref >=60)
Glucose, Bld: 81 mg/dL (ref 65–99)
Potassium: 4.3 mmol/L (ref 3.5–5.3)
Sodium: 138 mmol/L (ref 135–146)

## 2016-01-17 LAB — HEMOGLOBIN A1C
Hgb A1c MFr Bld: 6.2 % — ABNORMAL HIGH (ref <5.7)
Mean Plasma Glucose: 131 mg/dL

## 2016-01-17 LAB — LIPID PANEL
Cholesterol: 182 mg/dL (ref 125–200)
HDL: 62 mg/dL (ref >=46)
LDL Cholesterol: 96 mg/dL (ref <130)
Total CHOL/HDL Ratio: 2.9 Ratio (ref <=5.0)
Triglycerides: 119 mg/dL (ref <150)
VLDL: 24 mg/dL (ref <30)

## 2016-01-18 ENCOUNTER — Ambulatory Visit: Payer: 59 | Admitting: Neurology

## 2016-01-27 NOTE — Pre-Procedure Instructions (Signed)
Kristina Webb  01/27/2016      Your procedure is scheduled on June 9.  Report to [facility] Admitting at 7:30 A.M.  Call this number if you have problems the morning of surgery:  [phone]   Remember:  Do not eat food or drink liquids after midnight.  Take these medicines the morning of surgery with A SIP OF WATER Albuterol (if needed), Symbicort, Levothyroxine, Singulair, Nucynta (if needed), Pantoprazole, ranitidine, Zoloft, Imitrex 9if needed), Chlorpheniramine (if needed),    STOP Multiple Vitamins/ Minerals,  Diclofenac patch, B12, Vitamin D today   STOP/ Do not take Aspirin, Aleve, Naproxen, Advil, Ibuprofen, Motrin, Vitamins, Herbs, or Supplements starting today   How to Manage Your Diabetes Before and After Surgery  Why is it important to control my blood sugar before and after surgery? . Improving blood sugar levels before and after surgery helps healing and can limit problems. . A way of improving blood sugar control is eating a healthy diet by: o  Eating less sugar and carbohydrates o  Increasing activity/exercise o  Talking with your doctor about reaching your blood sugar goals . High blood sugars (greater than 180 mg/dL) can raise your risk of infections and slow your recovery, so you will need to focus on controlling your diabetes during the weeks before surgery. . Make sure that the doctor who takes care of your diabetes knows about your planned surgery including the date and location.  How do I manage my blood sugar before surgery? . Check your blood sugar at least 4 times a day, starting 2 days before surgery, to make sure that the level is not too high or low. o Check your blood sugar the morning of your surgery when you wake up and every 2 hours until you get to the Short Stay unit. . If your blood sugar is less than 70 mg/dL, you will need to treat for low blood sugar: o Do not take insulin. o Treat a low blood sugar (less than 70 mg/dL) with   cup of clear juice (cranberry or apple), 4 glucose tablets, OR glucose gel. o Recheck blood sugar in 15 minutes after treatment (to make sure it is greater than 70 mg/dL). If your blood sugar is not greater than 70 mg/dL on recheck, call [phone] for further instructions. . Report your blood sugar to the short stay nurse when you get to Short Stay.  . If you are admitted to the hospital after surgery: o Your blood sugar will be checked by the staff and you will probably be given insulin after surgery (instead of oral diabetes medicines) to make sure you have good blood sugar levels. o The goal for blood sugar control after surgery is 80-180 mg/dL.  WHAT DO I DO ABOUT MY DIABETES MEDICATION?   Marland Kitchen Do not take oral diabetes medicines (pills) the morning of surgery.    Do not wear jewelry, make-up or nail polish.  Do not wear lotions, powders, or perfumes.  You may NOT wear deodorant.  Do not shave 48 hours prior to surgery.  Men may shave face and neck.  Do not bring valuables to the hospital.  [facility] is not responsible for any belongings or valuables.  Contacts, dentures or bridgework may not be worn into surgery.  Leave your suitcase in the car.  After surgery it may be brought to your room.  For patients admitted to the hospital, discharge time will be determined by your treatment team.  Patients discharged the day of surgery will not be allowed to drive home.   [place] - Preparing for Surgery  Before surgery, you can play an important role.  Because skin is not sterile, your skin needs to be as free of germs as possible.  You can reduce the number of germs on you skin by washing with CHG (chlorahexidine gluconate) soap before surgery.  CHG is an antiseptic cleaner which kills germs and bonds with the skin to continue killing germs even after washing.  Please DO NOT use if you have an allergy to CHG or antibacterial soaps.  If your skin becomes reddened/irritated stop  using the CHG and inform your nurse when you arrive at Short Stay.  Do not shave (including legs and underarms) for at least 48 hours prior to the first CHG shower.  You may shave your face.  Please follow these instructions carefully:   1.  Shower with CHG Soap the night before surgery and the morning of Surgery.  2.  If you choose to wash your hair, wash your hair first as usual with your normal shampoo.  3.  After you shampoo, rinse your hair and body thoroughly to remove the shampoo.  4.  Use CHG as you would any other liquid soap.  You can apply CHG directly to the skin and wash gently with scrungie or a clean washcloth.  5.  Apply the CHG Soap to your body ONLY FROM THE NECK DOWN.  Do not use on open wounds or open sores.  Avoid contact with your eyes, ears, mouth and genitals (private parts).  Wash genitals (private parts) with your normal soap.  6.  Wash thoroughly, paying special attention to the area where your surgery will be performed.  7.  Thoroughly rinse your body with warm water from the neck down.  8.  DO NOT shower/wash with your normal soap after using and rinsing off the CHG Soap.  9.  Pat yourself dry with a clean towel.            10.  Wear clean pajamas.            11.  Place clean sheets on your bed the night of your first shower and do not sleep with pets.  Day of Surgery  Do not apply any lotions the morning of surgery.  Please wear clean clothes to the hospital/surgery center.

## 2016-01-29 ENCOUNTER — Encounter (HOSPITAL_COMMUNITY)
Admission: RE | Admit: 2016-01-29 | Discharge: 2016-01-29 | Disposition: A | Discharge status: Home / Self Care | Payer: 59 | Source: Ambulatory Visit | Attending: General Surgery | Admitting: General Surgery

## 2016-01-29 ENCOUNTER — Encounter (HOSPITAL_COMMUNITY): Payer: Self-pay

## 2016-01-29 DIAGNOSIS — Z01812 Encounter for preprocedural laboratory examination: Secondary | ICD-10-CM | POA: Diagnosis not present

## 2016-01-29 DIAGNOSIS — D242 Benign neoplasm of left breast: Secondary | ICD-10-CM | POA: Diagnosis not present

## 2016-01-29 HISTORY — DX: Headache, unspecified: R51.9

## 2016-01-29 HISTORY — DX: Personal history of other diseases of the digestive system: Z87.19

## 2016-01-29 HISTORY — DX: Unspecified osteoarthritis, unspecified site: M19.90

## 2016-01-29 HISTORY — DX: Headache: R51

## 2016-01-29 LAB — BASIC METABOLIC PANEL
Anion gap: 5 (ref 5–15)
BUN: 19 mg/dL (ref 6–20)
CO2: 28 mmol/L (ref 22–32)
Calcium: 9.6 mg/dL (ref 8.9–10.3)
Chloride: 105 mmol/L (ref 101–111)
Creatinine, Ser: 0.91 mg/dL (ref 0.44–1.00)
GFR calc Af Amer: >60 mL/min (ref >60)
GFR calc non Af Amer: >60 mL/min (ref >60)
Glucose, Bld: 97 mg/dL (ref 65–99)
Potassium: 4.2 mmol/L (ref 3.5–5.1)
Sodium: 138 mmol/L (ref 135–145)

## 2016-01-29 LAB — CBC
HCT: 38.8 % (ref 36.0–46.0)
Hemoglobin: 11.9 g/dL — ABNORMAL LOW (ref 12.0–15.0)
MCH: 24.3 pg — ABNORMAL LOW (ref 26.0–34.0)
MCHC: 30.7 g/dL (ref 30.0–36.0)
MCV: 79.3 fL (ref 78.0–100.0)
Platelets: 274 10*3/uL (ref 150–400)
RBC: 4.89 MIL/uL (ref 3.87–5.11)
RDW: 15.9 % — ABNORMAL HIGH (ref 11.5–15.5)
WBC: 8.8 10*3/uL (ref 4.0–10.5)

## 2016-01-29 LAB — GLUCOSE, CAPILLARY: Glucose-Capillary: 92 mg/dL (ref 65–99)

## 2016-01-29 NOTE — Progress Notes (Signed)
PCP" Dr. Melford Aase Fasting blood sugars 92-105

## 2016-02-02 ENCOUNTER — Ambulatory Visit (HOSPITAL_COMMUNITY): Payer: 59 | Admitting: Emergency Medicine

## 2016-02-02 ENCOUNTER — Ambulatory Visit (HOSPITAL_COMMUNITY)
Admission: RE | Admit: 2016-02-02 | Discharge: 2016-02-02 | Disposition: A | Discharge status: Home / Self Care | Payer: 59 | Source: Ambulatory Visit | Attending: General Surgery | Admitting: General Surgery

## 2016-02-02 ENCOUNTER — Encounter (HOSPITAL_COMMUNITY)
Admission: RE | Disposition: A | Discharge status: Home / Self Care | Payer: Self-pay | Source: Ambulatory Visit | Attending: General Surgery

## 2016-02-02 ENCOUNTER — Ambulatory Visit (HOSPITAL_COMMUNITY): Payer: 59 | Admitting: Anesthesiology

## 2016-02-02 ENCOUNTER — Encounter (HOSPITAL_COMMUNITY): Payer: Self-pay | Admitting: Anesthesiology

## 2016-02-02 DIAGNOSIS — Z7984 Long term (current) use of oral hypoglycemic drugs: Secondary | ICD-10-CM | POA: Diagnosis not present

## 2016-02-02 DIAGNOSIS — G4730 Sleep apnea, unspecified: Secondary | ICD-10-CM | POA: Diagnosis not present

## 2016-02-02 DIAGNOSIS — E119 Type 2 diabetes mellitus without complications: Secondary | ICD-10-CM | POA: Diagnosis not present

## 2016-02-02 DIAGNOSIS — D242 Benign neoplasm of left breast: Secondary | ICD-10-CM | POA: Insufficient documentation

## 2016-02-02 DIAGNOSIS — I10 Essential (primary) hypertension: Secondary | ICD-10-CM | POA: Insufficient documentation

## 2016-02-02 DIAGNOSIS — Z79899 Other long term (current) drug therapy: Secondary | ICD-10-CM | POA: Insufficient documentation

## 2016-02-02 DIAGNOSIS — E079 Disorder of thyroid, unspecified: Secondary | ICD-10-CM | POA: Insufficient documentation

## 2016-02-02 DIAGNOSIS — K219 Gastro-esophageal reflux disease without esophagitis: Secondary | ICD-10-CM | POA: Diagnosis not present

## 2016-02-02 DIAGNOSIS — J45909 Unspecified asthma, uncomplicated: Secondary | ICD-10-CM | POA: Insufficient documentation

## 2016-02-02 DIAGNOSIS — Z7951 Long term (current) use of inhaled steroids: Secondary | ICD-10-CM | POA: Insufficient documentation

## 2016-02-02 DIAGNOSIS — F329 Major depressive disorder, single episode, unspecified: Secondary | ICD-10-CM | POA: Insufficient documentation

## 2016-02-02 DIAGNOSIS — Z79890 Hormone replacement therapy (postmenopausal): Secondary | ICD-10-CM | POA: Insufficient documentation

## 2016-02-02 HISTORY — PX: BREAST LUMPECTOMY WITH RADIOACTIVE SEED LOCALIZATION: SHX6424

## 2016-02-02 LAB — GLUCOSE, CAPILLARY
Glucose-Capillary: 98 mg/dL (ref 65–99)
Glucose-Capillary: 116 mg/dL — ABNORMAL HIGH (ref 65–99)

## 2016-02-02 SURGERY — BREAST LUMPECTOMY WITH RADIOACTIVE SEED LOCALIZATION
Anesthesia: General | Site: Breast | Laterality: Left

## 2016-02-02 MED ORDER — ONDANSETRON HCL 4 MG/2ML IJ SOLN
INTRAMUSCULAR | Status: AC
  Filled 2016-02-02: qty 2

## 2016-02-02 MED ORDER — MIDAZOLAM HCL 5 MG/5ML IJ SOLN
INTRAMUSCULAR | Status: DC | PRN
  Administered 2016-02-02: 2 mg via INTRAVENOUS

## 2016-02-02 MED ORDER — ROCURONIUM BROMIDE 50 MG/5ML IV SOLN
INTRAVENOUS | Status: AC
  Filled 2016-02-02: qty 1

## 2016-02-02 MED ORDER — PROPOFOL 10 MG/ML IV BOLUS
INTRAVENOUS | Status: AC
  Filled 2016-02-02: qty 20

## 2016-02-02 MED ORDER — BUPIVACAINE-EPINEPHRINE 0.25% -1:200000 IJ SOLN
INTRAMUSCULAR | Status: DC | PRN
  Administered 2016-02-02: 20 mL

## 2016-02-02 MED ORDER — OXYCODONE-ACETAMINOPHEN 5-325 MG PO TABS: 1.0000 | ORAL_TABLET | ORAL | Status: DC | PRN

## 2016-02-02 MED ORDER — OXYCODONE-ACETAMINOPHEN 5-325 MG PO TABS: 1.0000 | ORAL_TABLET | Freq: Once | ORAL | Status: DC

## 2016-02-02 MED ORDER — OXYCODONE-ACETAMINOPHEN 5-325 MG PO TABS
1.0000 | ORAL_TABLET | Freq: Once | ORAL | Status: AC
  Administered 2016-02-02: 1 via ORAL

## 2016-02-02 MED ORDER — LACTATED RINGERS IV SOLN: Freq: Once | INTRAVENOUS | Status: DC

## 2016-02-02 MED ORDER — PROPOFOL 10 MG/ML IV BOLUS
INTRAVENOUS | Status: DC | PRN
  Administered 2016-02-02: 200 mg via INTRAVENOUS

## 2016-02-02 MED ORDER — CEFAZOLIN SODIUM-DEXTROSE 2-4 GM/100ML-% IV SOLN
2.0000 g | INTRAVENOUS | Status: AC
  Administered 2016-02-02: 2 g via INTRAVENOUS
  Filled 2016-02-02: qty 100

## 2016-02-02 MED ORDER — DEXAMETHASONE SODIUM PHOSPHATE 10 MG/ML IJ SOLN
INTRAMUSCULAR | Status: DC | PRN
  Administered 2016-02-02: 10 mg via INTRAVENOUS

## 2016-02-02 MED ORDER — FENTANYL CITRATE (PF) 100 MCG/2ML IJ SOLN
INTRAMUSCULAR | Status: DC | PRN
  Administered 2016-02-02: 50 ug via INTRAVENOUS
  Administered 2016-02-02 (×2): 25 ug via INTRAVENOUS

## 2016-02-02 MED ORDER — CHLORHEXIDINE GLUCONATE 4 % EX LIQD
1.0000 | Freq: Once | CUTANEOUS | Status: DC
Start: 2016-02-03 — End: 2016-02-02

## 2016-02-02 MED ORDER — ONDANSETRON HCL 4 MG/2ML IJ SOLN
INTRAMUSCULAR | Status: DC | PRN
  Administered 2016-02-02: 4 mg via INTRAVENOUS

## 2016-02-02 MED ORDER — EPHEDRINE SULFATE 50 MG/ML IJ SOLN
INTRAMUSCULAR | Status: DC | PRN
  Administered 2016-02-02: 10 mg via INTRAVENOUS
  Administered 2016-02-02: 5 mg via INTRAVENOUS
  Administered 2016-02-02 (×2): 10 mg via INTRAVENOUS

## 2016-02-02 MED ORDER — CHLORHEXIDINE GLUCONATE 4 % EX LIQD: 1.0000 "application " | Freq: Once | CUTANEOUS | Status: DC

## 2016-02-02 MED ORDER — FENTANYL CITRATE (PF) 250 MCG/5ML IJ SOLN
INTRAMUSCULAR | Status: AC
  Filled 2016-02-02: qty 5

## 2016-02-02 MED ORDER — OXYCODONE-ACETAMINOPHEN 5-325 MG PO TABS
ORAL_TABLET | ORAL | Status: AC
  Filled 2016-02-02: qty 1

## 2016-02-02 MED ORDER — LACTATED RINGERS IV SOLN
INTRAVENOUS | Status: DC | PRN
  Administered 2016-02-02: 09:00:00 via INTRAVENOUS

## 2016-02-02 MED ORDER — 0.9 % SODIUM CHLORIDE (POUR BTL) OPTIME
TOPICAL | Status: DC | PRN
  Administered 2016-02-02: 1000 mL

## 2016-02-02 MED ORDER — MIDAZOLAM HCL 2 MG/2ML IJ SOLN
INTRAMUSCULAR | Status: AC
  Filled 2016-02-02: qty 2

## 2016-02-02 SURGICAL SUPPLY — 38 items
APPLIER CLIP 9.375 MED OPEN (MISCELLANEOUS)
APR CLP MED 9.3 20 MLT OPN (MISCELLANEOUS)
BINDER BREAST LRG (GAUZE/BANDAGES/DRESSINGS) IMPLANT
BINDER BREAST XLRG (GAUZE/BANDAGES/DRESSINGS) IMPLANT
BLADE SURG 15 STRL LF DISP TIS (BLADE) ×1 IMPLANT
BLADE SURG 15 STRL SS (BLADE) ×2
CANISTER SUCTION 2500CC (MISCELLANEOUS) ×2 IMPLANT
CHLORAPREP W/TINT 26ML (MISCELLANEOUS) ×2 IMPLANT
CLIP APPLIE 9.375 MED OPEN (MISCELLANEOUS) IMPLANT
COVER PROBE W GEL 5X96 (DRAPES) ×2 IMPLANT
COVER SURGICAL LIGHT HANDLE (MISCELLANEOUS) ×2 IMPLANT
DEVICE DUBIN SPECIMEN MAMMOGRA (MISCELLANEOUS) ×2 IMPLANT
DRAPE CHEST BREAST 15X10 FENES (DRAPES) ×2 IMPLANT
DRAPE UTILITY XL STRL (DRAPES) ×2 IMPLANT
ELECT COATED BLADE 2.86 ST (ELECTRODE) ×2 IMPLANT
ELECT REM PT RETURN 9FT ADLT (ELECTROSURGICAL) ×2
ELECTRODE REM PT RTRN 9FT ADLT (ELECTROSURGICAL) ×1 IMPLANT
GLOVE BIO SURGEON STRL SZ7.5 (GLOVE) ×6 IMPLANT
GOWN STRL REUS W/ TWL LRG LVL3 (GOWN DISPOSABLE) ×2 IMPLANT
GOWN STRL REUS W/TWL LRG LVL3 (GOWN DISPOSABLE) ×6
KIT BASIN OR (CUSTOM PROCEDURE TRAY) ×2 IMPLANT
KIT MARKER MARGIN INK (KITS) ×2 IMPLANT
LIQUID BAND (GAUZE/BANDAGES/DRESSINGS) ×2 IMPLANT
NDL HYPO 25X1 1.5 SAFETY (NEEDLE) ×1 IMPLANT
NEEDLE HYPO 25X1 1.5 SAFETY (NEEDLE) ×2 IMPLANT
NS IRRIG 1000ML POUR BTL (IV SOLUTION) ×2 IMPLANT
PACK SURGICAL SETUP 50X90 (CUSTOM PROCEDURE TRAY) ×2 IMPLANT
PENCIL BUTTON HOLSTER BLD 10FT (ELECTRODE) ×2 IMPLANT
SPONGE LAP 18X18 X RAY DECT (DISPOSABLE) ×2 IMPLANT
SUT MNCRL AB 4-0 PS2 18 (SUTURE) ×2 IMPLANT
SUT SILK 2 0 SH (SUTURE) IMPLANT
SUT VIC AB 3-0 SH 18 (SUTURE) ×2 IMPLANT
SYR BULB 3OZ (MISCELLANEOUS) ×2 IMPLANT
SYR CONTROL 10ML LL (SYRINGE) ×2 IMPLANT
TOWEL OR 17X24 6PK STRL BLUE (TOWEL DISPOSABLE) ×2 IMPLANT
TOWEL OR 17X26 10 PK STRL BLUE (TOWEL DISPOSABLE) ×2 IMPLANT
TUBE CONNECTING 12X1/4 (SUCTIONS) ×2 IMPLANT
YANKAUER SUCT BULB TIP NO VENT (SUCTIONS) ×2 IMPLANT

## 2016-02-02 NOTE — Anesthesia Postprocedure Evaluation (Signed)
Anesthesia Post Note  Patient: Kristina Webb  Procedure(s) Performed: Procedure(s) (LRB): LEFT BREAST LUMPECTOMY WITH RADIOACTIVE SEED LOCALIZATION (Left)  Patient location during evaluation: PACU Anesthesia Type: General Level of consciousness: awake and alert Pain management: pain level controlled Vital Signs Assessment: post-procedure vital signs reviewed and stable Respiratory status: spontaneous breathing, nonlabored ventilation, respiratory function stable and patient connected to nasal cannula oxygen Cardiovascular status: blood pressure returned to baseline and stable Postop Assessment: no signs of nausea or vomiting Anesthetic complications: no    Last Vitals:  Filed Vitals:   02/02/16 1054 02/02/16 1108  BP:  132/80  Pulse: 88 82  Temp:    Resp: 21 16    Last Pain: There were no vitals filed for this visit.               Zenaida Deed

## 2016-02-02 NOTE — Anesthesia Preprocedure Evaluation (Signed)
Anesthesia Evaluation  Patient identified by MRN, date of birth, ID band Patient awake    Reviewed: Allergy & Precautions, H&P , NPO status , Patient's Chart, lab work & pertinent test results  History of Anesthesia Complications Negative for: history of anesthetic complications  Airway Mallampati: II  TM Distance: >3 FB Neck ROM: full    Dental no notable dental hx.    Pulmonary sleep apnea ,    Pulmonary exam normal breath sounds clear to auscultation       Cardiovascular hypertension, Normal cardiovascular exam Rhythm:regular Rate:Normal     Neuro/Psych  Headaches, PSYCHIATRIC DISORDERS Anxiety Depression    GI/Hepatic Neg liver ROS, GERD  ,  Endo/Other  diabetes  Renal/GU negative Renal ROS     Musculoskeletal   Abdominal   Peds  Hematology negative hematology ROS (+)   Anesthesia Other Findings   Reproductive/Obstetrics negative OB ROS                             Anesthesia Physical Anesthesia Plan  ASA: II  Anesthesia Plan: General   Post-op Pain Management:    Induction: Intravenous  Airway Management Planned: LMA  Additional Equipment:   Intra-op Plan:   Post-operative Plan:   Informed Consent: I have reviewed the patients History and Physical, chart, labs and discussed the procedure including the risks, benefits and alternatives for the proposed anesthesia with the patient or authorized representative who has indicated his/her understanding and acceptance.   Dental Advisory Given  Plan Discussed with: Anesthesiologist, CRNA and Surgeon  Anesthesia Plan Comments:         Anesthesia Quick Evaluation

## 2016-02-02 NOTE — Transfer of Care (Signed)
Immediate Anesthesia Transfer of Care Note  Patient: Kristina Webb  Procedure(s) Performed: Procedure(s): LEFT BREAST LUMPECTOMY WITH RADIOACTIVE SEED LOCALIZATION (Left)  Patient Location: PACU  Anesthesia Type:General  Level of Consciousness: awake, alert , oriented and patient cooperative  Airway & Oxygen Therapy: Patient Spontanous Breathing and Patient connected to nasal cannula oxygen  Post-op Assessment: Report given to RN and Post -op Vital signs reviewed and stable  Post vital signs: Reviewed and stable  Last Vitals:  Filed Vitals:   02/02/16 0805  BP: 139/82  Pulse: 56  Temp: 36.6 C  Resp: 18    Last Pain: There were no vitals filed for this visit.       Complications: No apparent anesthesia complications

## 2016-02-02 NOTE — Addendum Note (Signed)
Addendum  created 02/02/16 1439 by Laretta Alstrom, CRNA   Modules edited: Anesthesia Events, Narrator   Narrator:  Narrator: Event Log Edited

## 2016-02-02 NOTE — Op Note (Signed)
02/02/2016  10:15 AM  PATIENT:  Kristina Webb  60 y.o. female  PRE-OPERATIVE DIAGNOSIS:  Left breast papilloma   POST-OPERATIVE DIAGNOSIS: Left breast papilloma  PROCEDURE:  Procedure(s): LEFT BREAST LUMPECTOMY WITH RADIOACTIVE SEED LOCALIZATION (Left)  SURGEON:  Surgeon(s) and Role:    * Jovita Kussmaul, MD - Primary  PHYSICIAN ASSISTANT:   ASSISTANTS: none   ANESTHESIA:   general  EBL:     BLOOD ADMINISTERED:none  DRAINS: none   LOCAL MEDICATIONS USED:  MARCAINE     SPECIMEN:  Source of Specimen:  left breast tissue  DISPOSITION OF SPECIMEN:  PATHOLOGY  COUNTS:  YES  TOURNIQUET:  * No tourniquets in log *  DICTATION: .Dragon Dictation   After informed consent was obtained the patient was brought to the operating room and placed in the supine position on the operating room table. After adequate induction of general anesthesia the patient's left breast was prepped with ChloraPrep, allowed to dry, and draped in usual sterile manner. An appropriate timeout was performed. Previously an I-125 seed was placed in the upper subareolar left breast to mark an area of an intraductal papilloma. The neoprobe was sent to I-125 in the area of radioactivity was readily identified under the superior aspect of the areola. A curvilinear incision was made along the upper aspect of the areola with a 15 blade knife. The area was infiltrated with quarter percent Marcaine. The incision was carried through the skin and subcutaneous change tissue sharply with electrocautery.  While checking the area radioactivity frequently with the neoprobe a circular portion of breast tissue was excised sharply around the radioactive seed. Once the specimen was removed it was oriented with the appropriate paint colors. A specimen radiograph was obtained that showed the clip and seed to be near the center of the specimen. The specimen was then sent pathology for further evaluation.  Hemostasis was achieved using the  Bovie electrocautery. The wound was irrigated with saline and infiltrated with quarter percent Marcaine.  The deep layer of the wound was then closed with layers of interrupted 3-0 Vicryl stitches. The skin was then closed with interrupted 4-0 Monocryl subcuticular stitches. Dermabond dressings were applied. Patient tolerated the procedure well. At the end of the case all needle sponge and instrument counts were correct. The patient was then awakened and taken to recovery in stable condition.  PLAN OF CARE: Discharge to home after PACU  PATIENT DISPOSITION:  PACU - hemodynamically stable.   Delay start of Pharmacological VTE agent (>24hrs) due to surgical blood loss or risk of bleeding: not applicable

## 2016-02-02 NOTE — Interval H&P Note (Signed)
History and Physical Interval Note:  02/02/2016 8:49 AM  Kristina Webb  has presented today for surgery, with the diagnosis of Left breast papilloma   The various methods of treatment have been discussed with the patient and family. After consideration of risks, benefits and other options for treatment, the patient has consented to  Procedure(s): LEFT BREAST LUMPECTOMY WITH RADIOACTIVE SEED LOCALIZATION (Left) as a surgical intervention .  The patient's history has been reviewed, patient examined, no change in status, stable for surgery.  I have reviewed the patient's chart and labs.  Questions were answered to the patient's satisfaction.     TOTH III,Baden Betsch S

## 2016-02-02 NOTE — H&P (Signed)
Kristina Webb  Location: [facility] Surgery Patient #: [id number] DOB: 1955/09/13 Married / Language: English / Race: White Female   History of Present Illness  The patient is a 60 year old female who presents with a breast mass. We are asked to see the patient in consultation by Dr. Emmit Pomfret to evaluate her for a left breast intraductal papilloma. The patient is a 60 year old white female who recently noticed a small spot on her left nipple. When she pushed on it she experienced some clear wound drainage. She denied any significant pain in either breast. She did have a papilloma removed from the right breast about 4 years ago. She also states that her mother experienced breast cancer at the age of 53.   Other Problems  Arthritis Asthma Back Pain Diabetes Mellitus Gastroesophageal Reflux Disease Hemorrhoids High blood pressure Sleep Apnea Thyroid Disease  Past Surgical History  Breast Biopsy Bilateral. Colon Polyp Removal - Colonoscopy Colon Polyp Removal - Open Oral Surgery Resection of Small Bowel Spinal Surgery - Lower Back  Diagnostic Studies History Colonoscopy 1-5 years ago Mammogram within last year Pap Smear 1-5 years ago  Allergies Doxycycline *DERMATOLOGICALS* LevoFLOXacin *CHEMICALS* PredniSONE (Pak) *CORTICOSTEROIDS*  Medication History Amitriptyline HCl (10MG  Tablet, Oral) Active. Cyclobenzaprine HCl (10MG  Tablet, Oral) Active. Estrace (0.1MG /GM Cream, Vaginal) Active. Furosemide (40MG  Tablet, Oral) Active. Losartan Potassium (100MG  Tablet, Oral) Active. Montelukast Sodium (10MG  Tablet, Oral) Active. Pantoprazole Sodium (40MG  Tablet DR, Oral) Active. RaNITidine HCl (300MG  Tablet, Oral) Active. SUMAtriptan Succinate (100MG  Tablet, Oral) Active. Synthroid (75MCG Tablet, Oral) Active. Medications Reconciled  Social History  Caffeine use Carbonated beverages. No alcohol use No drug use Tobacco use  Never smoker.  Family History  Alcohol Abuse Father. Arthritis Brother, Father, Mother. Breast Cancer Mother. Cancer Father, Mother. Colon Polyps Brother. Diabetes Mellitus Mother. Heart Disease Mother. Heart disease in female family member before age 108 Heart disease in female family member before age 21 Hypertension Father, Mother. Melanoma Mother. Migraine Headache Mother, Sister. Ovarian Cancer Mother. Prostate Cancer Father. Thyroid problems Mother.  Pregnancy / Birth History  Age at menarche 21 years. Age of menopause 51-55 Contraceptive History Oral contraceptives. Gravida 0 Para 0 Regular periods    Review of Systems  General Not Present- Appetite Loss, Chills, Fatigue, Fever, Night Sweats, Weight Gain and Weight Loss. Skin Not Present- Change in Wart/Mole, Dryness, Hives, Jaundice, New Lesions, Non-Healing Wounds, Rash and Ulcer. HEENT Not Present- Earache, Hearing Loss, Hoarseness, Nose Bleed, Oral Ulcers, Ringing in the Ears, Seasonal Allergies, Sinus Pain, Sore Throat, Visual Disturbances, Wears glasses/contact lenses and Yellow Eyes. Breast Present- Breast Pain and Nipple Discharge. Not Present- Breast Mass and Skin Changes. Cardiovascular Not Present- Chest Pain, Difficulty Breathing Lying Down, Leg Cramps, Palpitations, Rapid Heart Rate, Shortness of Breath and Swelling of Extremities. Gastrointestinal Not Present- Abdominal Pain, Bloating, Bloody Stool, Change in Bowel Habits, Chronic diarrhea, Constipation, Difficulty Swallowing, Excessive gas, Gets full quickly at meals, Hemorrhoids, Indigestion, Nausea, Rectal Pain and Vomiting. Female Genitourinary Not Present- Frequency, Nocturia, Painful Urination, Pelvic Pain and Urgency. Musculoskeletal Not Present- Back Pain, Joint Pain, Joint Stiffness, Muscle Pain, Muscle Weakness and Swelling of Extremities. Neurological Not Present- Decreased Memory, Fainting, Headaches, Numbness, Seizures,  Tingling, Tremor, Trouble walking and Weakness. Psychiatric Not Present- Anxiety, Bipolar, Change in Sleep Pattern, Depression, Fearful and Frequent crying. Endocrine Not Present- Cold Intolerance, Excessive Hunger, Hair Changes, Heat Intolerance, Hot flashes and New Diabetes. Hematology Not Present- Easy Bruising, Excessive bleeding, Gland problems, HIV and Persistent Infections.  Vitals  Weight:  204 lb Height: 65in Body Surface Area: 1.99 m Body Mass Index: 33.95 kg/m  Temp.: 56F(Temporal)  Pulse: 75 (Regular)  BP: 126/80 (Sitting, Left Arm, Standard)       Physical Exam General Mental Status-Alert. General Appearance-Consistent with stated age. Hydration-Well hydrated. Voice-Normal.  Head and Neck Head-normocephalic, atraumatic with no lesions or palpable masses. Trachea-midline. Thyroid Gland Characteristics - normal size and consistency.  Eye Eyeball - Bilateral-Extraocular movements intact. Sclera/Conjunctiva - Bilateral-No scleral icterus.  Chest and Lung Exam Chest and lung exam reveals -quiet, even and easy respiratory effort with no use of accessory muscles and on auscultation, normal breath sounds, no adventitious sounds and normal vocal resonance. Inspection Chest Wall - Normal. Back - normal.  Breast Note: There is no palpable mass in either breast. There is no palpable axillary, supraclavicular, or cervical lymphadenopathy. I cannot express any discharge from the nipple today.   Cardiovascular Cardiovascular examination reveals -normal heart sounds, regular rate and rhythm with no murmurs and normal pedal pulses bilaterally.  Abdomen Inspection Inspection of the abdomen reveals - No Hernias. Skin - Scar - no surgical scars. Palpation/Percussion Palpation and Percussion of the abdomen reveal - Soft, Non Tender, No Rebound tenderness, No Rigidity (guarding) and No hepatosplenomegaly. Auscultation Auscultation of the  abdomen reveals - Bowel sounds normal.  Neurologic Neurologic evaluation reveals -alert and oriented x 3 with no impairment of recent or remote memory. Mental Status-Normal.  Musculoskeletal Normal Exam - Left-Upper Extremity Strength Normal and Lower Extremity Strength Normal. Normal Exam - Right-Upper Extremity Strength Normal and Lower Extremity Strength Normal.  Lymphatic Head & Neck  General Head & Neck Lymphatics: Bilateral - Description - Normal. Axillary  General Axillary Region: Bilateral - Description - Normal. Tenderness - Non Tender. Femoral & Inguinal  Generalized Femoral & Inguinal Lymphatics: Bilateral - Description - Normal. Tenderness - Non Tender.    Assessment & Plan  PAPILLOMA OF LEFT BREAST (D24.2) Impression: The patient appears to have a intraductal papilloma at the inner aspect of the subareolar left breast. Because of its abnormal appearance and because it can be considered a high risk lesion I would recommend that this area be removed. She would also like to have this done. I have discussed with her in detail the risks and benefits of the operation to do this as well as some of the technical aspects and she understands and wishes to proceed. I will plan for a left breast radioactive seed localized lumpectomy. Current Plans Pt Education - Breast Diseases: discussed with patient and provided information.   Signed by Luella Cook, MD

## 2016-02-05 ENCOUNTER — Encounter (HOSPITAL_COMMUNITY): Payer: Self-pay | Admitting: General Surgery

## 2016-02-10 ENCOUNTER — Other Ambulatory Visit: Payer: Self-pay | Admitting: Physician Assistant

## 2016-02-15 ENCOUNTER — Other Ambulatory Visit: Payer: Self-pay | Admitting: Internal Medicine

## 2016-02-18 ENCOUNTER — Encounter: Payer: Self-pay | Admitting: *Deleted

## 2016-02-22 ENCOUNTER — Other Ambulatory Visit: Payer: Self-pay | Admitting: Internal Medicine

## 2016-02-28 ENCOUNTER — Other Ambulatory Visit: Payer: Self-pay | Admitting: *Deleted

## 2016-02-28 MED ORDER — RANITIDINE HCL 300 MG PO TABS: ORAL_TABLET | ORAL | Status: DC

## 2016-03-03 ENCOUNTER — Encounter: Payer: Self-pay | Admitting: *Deleted

## 2016-04-12 ENCOUNTER — Encounter: Payer: Self-pay | Admitting: *Deleted

## 2016-05-02 ENCOUNTER — Ambulatory Visit (INDEPENDENT_AMBULATORY_CARE_PROVIDER_SITE_OTHER): Payer: 59 | Admitting: Physician Assistant

## 2016-05-02 ENCOUNTER — Encounter: Payer: Self-pay | Admitting: Physician Assistant

## 2016-05-02 VITALS — BP 122/86 | HR 73 | Temp 97.3°F | Resp 16 | Ht 64.5 in | Wt 205.4 lb

## 2016-05-02 DIAGNOSIS — Q8589 Other phakomatoses, not elsewhere classified: Secondary | ICD-10-CM

## 2016-05-02 DIAGNOSIS — E119 Type 2 diabetes mellitus without complications: Secondary | ICD-10-CM

## 2016-05-02 DIAGNOSIS — Z79899 Other long term (current) drug therapy: Secondary | ICD-10-CM

## 2016-05-02 DIAGNOSIS — Q858 Other phakomatoses, not elsewhere classified: Secondary | ICD-10-CM

## 2016-05-02 DIAGNOSIS — I10 Essential (primary) hypertension: Secondary | ICD-10-CM | POA: Diagnosis not present

## 2016-05-02 DIAGNOSIS — E785 Hyperlipidemia, unspecified: Secondary | ICD-10-CM | POA: Diagnosis not present

## 2016-05-02 LAB — CBC WITH DIFFERENTIAL/PLATELET
Basophils Absolute: 0 cells/uL (ref 0–200)
Basophils Relative: 0 %
Eosinophils Absolute: 351 cells/uL (ref 15–500)
Eosinophils Relative: 3 %
HCT: 39.4 % (ref 35.0–45.0)
Hemoglobin: 12.7 g/dL (ref 11.7–15.5)
Lymphocytes Relative: 28 %
Lymphs Abs: 3276 cells/uL (ref 850–3900)
MCH: 25.8 pg — ABNORMAL LOW (ref 27.0–33.0)
MCHC: 32.2 g/dL (ref 32.0–36.0)
MCV: 79.9 fL — ABNORMAL LOW (ref 80.0–100.0)
MPV: 9.1 fL (ref 7.5–12.5)
Monocytes Absolute: 702 cells/uL (ref 200–950)
Monocytes Relative: 6 %
Neutro Abs: 7371 cells/uL (ref 1500–7800)
Neutrophils Relative %: 63 %
Platelets: 323 10*3/uL (ref 140–400)
RBC: 4.93 MIL/uL (ref 3.80–5.10)
RDW: 16 % — ABNORMAL HIGH (ref 11.0–15.0)
WBC: 11.7 10*3/uL — ABNORMAL HIGH (ref 3.8–10.8)

## 2016-05-02 MED ORDER — PHENTERMINE HCL 37.5 MG PO TABS: 37.5000 mg | ORAL_TABLET | Freq: Every day | ORAL | 2 refills | Status: DC

## 2016-05-02 MED ORDER — RANITIDINE HCL 300 MG PO TABS: ORAL_TABLET | ORAL | 3 refills | Status: DC

## 2016-05-02 NOTE — Patient Instructions (Signed)
Phentermine  While taking the medication we may ask that you come into the office once a month or once every 2-3 months to monitor your weight, blood pressure, and heart rate. In addition we can help answer your questions about diet, exercise, and help you every step of the way with your weight loss journey. Sometime it is helpful if you bring in a food diary or use an app on your phone such as myfitnesspal to record your calorie intake, especially in the beginning.   You can start out on 1/3 to 1/2 a pill in the morning and if you are tolerating it well you can increase to one pill daily. I also have some patients that take 1/3 or 1/2 at lunch to help prevent night time eating.  This medication is cheapest CASH pay at [facility] is 14-17 dollars and you do NOT need a membership to get meds from there.    What is this medicine? PHENTERMINE (FEN ter meen) decreases your appetite. This medicine is intended to be used in addition to a healthy reduced calorie diet and exercise. The best results are achieved this way. This medicine is only indicated for short-term use. Eventually your weight loss may level out and the medication will no longer be needed.   How should I use this medicine? Take this medicine by mouth. Follow the directions on the prescription label. The tablets should stay in the bottle until immediately before you take your dose. Take your doses at regular intervals. Do not take your medicine more often than directed.  Overdosage: If you think you have taken too much of this medicine contact a poison control center or emergency room at once. NOTE: This medicine is only for you. Do not share this medicine with others.  What if I miss a dose? If you miss a dose, take it as soon as you can. If it is almost time for your next dose, take only that dose. Do not take double or extra doses. Do not increase or in any way change your dose without consulting your  doctor.  What should I watch for while using this medicine? Notify your physician immediately if you become short of breath while doing your normal activities. Do not take this medicine within 6 hours of bedtime. It can keep you from getting to sleep. Avoid drinks that contain caffeine and try to stick to a regular bedtime every night. Do not stand or sit up quickly, especially if you are an older patient. This reduces the risk of dizzy or fainting spells. Avoid alcoholic drinks.  What side effects may I notice from receiving this medicine? Side effects that you should report to your doctor or health care professional as soon as possible: -chest pain, palpitations -depression or severe changes in mood -increased blood pressure -irritability -nervousness or restlessness -severe dizziness -shortness of breath -problems urinating -unusual swelling of the legs -vomiting  Side effects that usually do not require medical attention (report to your doctor or health care professional if they continue or are bothersome): -blurred vision or other eye problems -changes in sexual ability or desire -constipation or diarrhea -difficulty sleeping -dry mouth or unpleasant taste -headache -nausea This list may not describe all possible side effects. Call your doctor for medical advice about side effects. You may report side effects to FDA at 1-800-FDA-1088.    Diet for Lactose Intolerance, Adult Lactose intolerance is when the body is not able to digest lactose, a  natural sugar found in milk and milk products. If you are lactose intolerant, you should avoid consuming food and drinks with lactose.  WHAT DO I NEED TO KNOW ABOUT THIS DIET?  Avoid consuming foods and beverages with lactose.  Look for the words "lactose-free" or "lactose-reduced" on food labels. You can have lactose-free foods and may be able to have small amounts of lactose-reduced foods.  Make sure you get enough nutrients in your  diet. People on this diet sometimes have trouble getting enough calcium, riboflavin, and vitamin D. Take supplements if directed by your health care provider. Talk to your health care provider about supplements if you are not taking any. WHICH FOODS HAVE LACTOSE? Lactose is found in milk and milk products, such as:   Yogurt.   Cheese.  Butter.   Margarine.  Sour cream.   Creamer.   Whipped toppings and nondairy creamers.  Ice cream and other milk-based desserts. Lactose is also found in foods made with milk or milk ingredients. To find out whether a food is made with milk or a milk ingredient, look at the ingredients list. Avoid foods with the statement "May contain milk" and foods that contain:   Butter.   Cream.  Milk.  Milk solids.  Milk powder.   Whey.  Curd.  Caseinate.  Lactose. WHAT ARE SOME ALTERNATIVES TO MILK AND FOODS MADE WITH MILK PRODUCTS?  Lactose-free products, such as lactose-free milk.  Almond or rice milk.  Soy products, such as soy yogurt, soy cheese, soy ice cream, soy-based sour cream, and soy-based infant formula.  Nondairy products, such as nondairy creamers and nondairy whipped topping. Note that nondairy products sometimes contain lactose, so it is important to check the ingredients list. CAN I HAVE ANY FOODS WITH LACTOSE? Some people with lactose intolerance can safely eat foods that have a little lactose. Foods with a little lactose have less than 1 g of lactose per serving. Examples of foods with a little lactose are:   Aged cheese (such as Swiss, cheddar, or Parmesan cheese). One serving is about 1-2 oz.  Cream cheese. One serving is about 2 Tbsp.  Ricotta cheese. One serving is about  cup. If you decide to try a food that has lactose:   Eat only one food with lactose in it at a time.  Eat only a small amount of the food.  Stop eating the food if your symptoms return. Some dairy products that are more likely than  others to be tolerated include:  Cheese, especially if it is aged.  Cultured dairy products, such as yogurt, buttermilk, cottage cheese, and kefir. The healthy bacteria in these products help digest lactose.  Lactose-hydrolyzed milk. This product contains 40-90% less lactose than milk. AM I GETTING ENOUGH CALCIUM? Calcium is found in many foods with lactose and is important for bone health. The amount of calcium you need depends on your age:   Adults younger than 50 years need 1000 mg of calcium a day.  Adults older than 50 years need 1200 mg of calcium a day. Make sure you get enough calcium by taking a calcium supplement or by eating lactose-free foods that are high in calcium, such as:   Almonds,  cup (95 mg).  Broccoli, cooked, 1 cup (60 mg).  Calcium-fortified breakfast cereals, 1 cup [phone]).  Calcium-fortified rice or almond milk, 1 cup (300 mg).  Calcium-fortified soy milk, 1 cup (300-400 mg).  Canned salmon with edible bones, 3 oz (180 mg).  Collard greens,  cooked,  cup (125 mg).  Edamame, cooked,  cup (125 mg).  Kale, frozen or cooked,  cup (90 mg).  Orange juice with calcium added, 1 cup (300-350 mg).  Sardines with edible bones, 3 oz (325 mg).  Spinach, cooked,  cup (145 mg).  Tofu set with calcium sulfate,  cup (250 mg).   This information is not intended to replace advice given to you by your health care provider. Make sure you discuss any questions you have with your health care provider.   Document Released: 03/09/2014 Document Reviewed: 03/09/2014 Elsevier Interactive Patient Education [employer].

## 2016-05-02 NOTE — Progress Notes (Signed)
Assessment and Plan: Essential hypertension - CBC with Differential/Platelet - Hepatic function panel - BASIC METABOLIC PANEL WITH GFR - TSH  Type 2 diabetes mellitus without complication, without long-term current use of insulin (HCC) - Hemoglobin A1c  CKD (chronic kidney disease) stage 2, GFR 60-89 ml/min - BASIC METABOLIC PANEL WITH GFR  Hyperlipemia - Lipid panel   Morbid obesity, unspecified obesity type (HCC) Obesity with co morbidities- long discussion about weight loss, diet, and exercise Add phentermine   Medication management - Magnesium  Vitamin D deficiency - VITAMIN D 25 Hydroxy (Vit-D Deficiency, Fractures)  AB pain Avoid milk products, continue prevacid, if not better follow up Dr. Earlean Webb, Benign AB  Continue diet and meds as discussed. Further disposition pending results of labs.  HPI 60 y.o. female  presents for 3 month follow up with hypertension, hyperlipidemia, prediabetes and vitamin D.  Her blood pressure has been controlled at home, today their BP is BP: 122/86  She does workout, walks and wears her fitbit, tries to walk 2 miles a day.  She denies chest pain, shortness of breath, dizziness.   She is not on cholesterol medication and denies myalgias. Her cholesterol is not at goal. The cholesterol last visit was:   Lab Results  Component Value Date   CHOL 182 01/16/2016   HDL 62 01/16/2016   LDLCALC 96 01/16/2016   TRIG 119 01/16/2016   CHOLHDL 2.9 01/16/2016    She has been working on diet and exercise for prediabetes, and denies paresthesia of the feet, polydipsia, polyuria and visual disturbances. Last A1C in the office was:  Lab Results  Component Value Date   HGBA1C 6.2 (H) 01/16/2016  Patient is on Vitamin D supplement.   Lab Results  Component Value Date   VD25OH 31 10/17/2015    BMI is Body mass index is 34.71 kg/m., she is working on diet and exercise. She has been following with Dr. Brett Webb for OSA/migraines.   Wt Readings from  Last 3 Encounters:  05/02/16 205 lb 6.4 oz (93.2 kg)  02/02/16 203 lb 12.8 oz (92.4 kg)  01/29/16 203 lb 12.8 oz (92.4 kg)   She is on thyroid medication. Her medication was not changed last visit.   Lab Results  Component Value Date   TSH 2.67 01/16/2016  She has history of lumbar back surgery over 17 years ago with fusion L4-L5, has had injection with Dr. Vira Webb.  She has constant constipation, 3-4 right flank pain, she will have some epigastric pain as well, feels like milk makes it worse.   Current Medications:  Current Outpatient Prescriptions on File Prior to Visit  Medication Sig Dispense Refill  . albuterol (PROAIR HFA) 108 (90 BASE) MCG/ACT inhaler Inhale 2 puffs into the lungs every 6 (six) hours as needed for wheezing.    Marland Kitchen amitriptyline (ELAVIL) 10 MG tablet TAKE 1 TO 2 TABLETS BY MOUTH DAILY AT BEDTIME (Patient taking differently: TAKE 0.5 TO 1 TABLET BY MOUTH DAILY AT BEDTIME as needed for sleep) 180 tablet 1  . budesonide-formoterol (SYMBICORT) 80-4.5 MCG/ACT inhaler Inhale 2 puffs into the lungs 2 (two) times daily. (Patient taking differently: Inhale 2 puffs into the lungs daily as needed (for wheezing or shortness of breath). )    . chlorpheniramine (CHLOR-TRIMETON) 4 MG tablet Take 4 mg by mouth every 6 (six) hours as needed for allergies.    . Cholecalciferol (VITAMIN D3) 10000 units TABS Take 10,000 Units by mouth daily.    . Cyanocobalamin (VITAMIN B 12  PO) Take 1 tablet by mouth daily.    . cyclobenzaprine (FLEXERIL) 10 MG tablet Take 1/2 to 1 tablet 2 to 3 x day if needed for muscle spasm (Patient taking differently: Take 20 mg by mouth at bedtime as needed for muscle spasms. ) 90 tablet 1  . diclofenac (FLECTOR) 1.3 % PTCH Place 1 patch onto the skin every morning. (Patient taking differently: Place 1 patch onto the skin daily as needed (for back pain). ) 30 patch 3  . ESTRACE VAGINAL 0.1 MG/GM vaginal cream Place 0.1 mg vaginally 2 (two) times a week.   12  .  furosemide (LASIX) 40 MG tablet TAKE 1 TABLET EVERY MORNING FOR BLOOD PRESSURE AND FLUID (Patient taking differently: TAKE 0.5 TABLET EVERY MORNING FOR BLOOD PRESSURE AND FLUID) 90 tablet 1  . levothyroxine (SYNTHROID, LEVOTHROID) 75 MCG tablet TAKE 1 TABLET DAILY 90 tablet 2  . losartan (COZAAR) 100 MG tablet TAKE 1 TABLET DAILY 90 tablet 3  . metFORMIN (GLUCOPHAGE-XR) 500 MG 24 hr tablet TAKE 1 TABLET BY MOUTH TWICE DAILY WITH FOOD 180 tablet 1  . montelukast (SINGULAIR) 10 MG tablet TAKE 1 TABLET (10 MG TOTAL) BY MOUTH DAILY. 30 tablet 2  . Multiple Vitamins-Minerals (ZINC PO) Take 1 tablet by mouth daily.    . NUCYNTA 50 MG TABS tablet TAKE 1 TABLET (50 mg) four times a day as needed for severe headache  0  . oxyCODONE-acetaminophen (ROXICET) 5-325 MG tablet Take 1-2 tablets by mouth every 4 (four) hours as needed. 30 tablet 0  . pantoprazole (PROTONIX) 40 MG tablet TAKE 1 TABLET DAILY 90 tablet 1  . potassium chloride SA (K-DUR,KLOR-CON) 20 MEQ tablet TAKE 1 TABLET DAILY (Patient taking differently: TAKE 1 TABLET DAILY as needed for low potassium) 90 tablet 1  . ranitidine (ZANTAC) 300 MG tablet TAKE TWICE A DAY WHILE TRYING TO GET OFF PPI, THEN CAN GO TO ONCE AT NIGHT 180 tablet 3  . sertraline (ZOLOFT) 100 MG tablet Take 1-2 tablets daily for anxiety (Patient taking differently: Take 100 mg by mouth daily. ) 180 tablet 3  . SUMAtriptan (IMITREX) 100 MG tablet Take 1 tablet (100 mg total) by mouth once as needed for migraine. May repeat in 2 hours if headache persists or recurs. 10 tablet 2   No current facility-administered medications on file prior to visit.    Medical History:  Past Medical History:  Diagnosis Date  . Anemia   . Anxiety   . Arthritis    back  . Asthma   . Depression   . Deviated septum   . Diabetes ([place])   . GERD (gastroesophageal reflux disease)   . Headache   . History of hiatal hernia   . Hyperlipemia   . Hypertension   . OSA (obstructive sleep apnea)     Allergies:  Allergies  Allergen Reactions  . Doxycycline Other (See Comments)    Unknown  . Levaquin [Levofloxacin In D5w] Other (See Comments)    Ankles hurt really bad  . Prednisone Swelling    Weight gain    Review of Systems:  Review of Systems  Constitutional: Positive for malaise/fatigue. Negative for chills, diaphoresis, fever and weight loss.  HENT: Negative for congestion, ear discharge, ear pain, hearing loss, nosebleeds, sore throat and tinnitus.   Eyes: Negative.   Respiratory: Negative.  Negative for stridor.   Cardiovascular: Negative.   Gastrointestinal: Positive for constipation. Negative for abdominal pain, blood in stool, diarrhea, heartburn, melena, nausea and vomiting.  Genitourinary: Negative.   Musculoskeletal: Positive for back pain. Negative for falls, joint pain, myalgias and neck pain.  Skin: Negative.   Neurological: Positive for tingling (right leg) and headaches. Negative for dizziness, tremors, sensory change, speech change, focal weakness, seizures, loss of consciousness and weakness.  Psychiatric/Behavioral: Positive for depression and memory loss. Negative for hallucinations, substance abuse and suicidal ideas. The patient has insomnia. The patient is not nervous/anxious.     Family history- Review and unchanged Social history- Review and unchanged Physical Exam: BP 122/86   Pulse 73   Temp 97.3 F (36.3 C)   Resp 16   Ht 5' 4.5" (1.638 m)   Wt 205 lb 6.4 oz (93.2 kg)   SpO2 96%   BMI 34.71 kg/m  Wt Readings from Last 3 Encounters:  05/02/16 205 lb 6.4 oz (93.2 kg)  02/02/16 203 lb 12.8 oz (92.4 kg)  01/29/16 203 lb 12.8 oz (92.4 kg)   General Appearance: Well nourished, in no apparent distress. Eyes: PERRLA, EOMs, conjunctiva no swelling or erythema Sinuses: No Frontal/maxillary tenderness ENT/Mouth: Ext aud canals clear, TMs without erythema, bulging. No erythema, swelling, or exudate on post pharynx.  Tonsils not swollen or  erythematous. Hearing normal.  Neck: Supple, thyroid normal.  Respiratory: Respiratory effort normal, BS equal bilaterally without rales, rhonchi, wheezing or stridor.  Cardio: RRR with no MRGs. Brisk peripheral pulses without edema.  Abdomen: Soft, + BS,  mild epigastric tenderness, no guarding, rebound, hernias, masses. Lymphatics: Non tender without lymphadenopathy.  Musculoskeletal: Full ROM, 5/5 strength, Normal gait,  Skin: Port wine stain right face. Warm, dry without rashes, lesions, ecchymosis.  Neuro: Cranial nerves intact. Normal muscle tone, no cerebellar symptoms. Psych: Awake and oriented X 3, normal affect, Insight and Judgment appropriate.    Vicie Mutters, PA-C 4:40 PM [facility]

## 2016-05-03 LAB — HEPATIC FUNCTION PANEL
ALT: 37 U/L — ABNORMAL HIGH (ref 6–29)
AST: 27 U/L (ref 10–35)
Albumin: 4.2 g/dL (ref 3.6–5.1)
Alkaline Phosphatase: 77 U/L (ref 33–130)
Bilirubin, Direct: 0.1 mg/dL (ref <=0.2)
Indirect Bilirubin: 0.3 mg/dL (ref 0.2–1.2)
Total Bilirubin: 0.4 mg/dL (ref 0.2–1.2)
Total Protein: 7.6 g/dL (ref 6.1–8.1)

## 2016-05-03 LAB — BASIC METABOLIC PANEL WITH GFR
BUN: 21 mg/dL (ref 7–25)
CO2: 22 mmol/L (ref 20–31)
Calcium: 9.4 mg/dL (ref 8.6–10.4)
Chloride: 98 mmol/L (ref 98–110)
Creat: 0.97 mg/dL (ref 0.50–1.05)
GFR, Est African American: 74 mL/min (ref >=60)
GFR, Est Non African American: 65 mL/min (ref >=60)
Glucose, Bld: 99 mg/dL (ref 65–99)
Potassium: 4.1 mmol/L (ref 3.5–5.3)
Sodium: 139 mmol/L (ref 135–146)

## 2016-05-03 LAB — LIPID PANEL
Cholesterol: 195 mg/dL (ref 125–200)
HDL: 67 mg/dL (ref >=46)
LDL Cholesterol: 102 mg/dL (ref <130)
Total CHOL/HDL Ratio: 2.9 Ratio (ref <=5.0)
Triglycerides: 131 mg/dL (ref <150)
VLDL: 26 mg/dL (ref <30)

## 2016-05-03 LAB — TSH: TSH: 3.26 mIU/L

## 2016-05-03 LAB — HEMOGLOBIN A1C
Hgb A1c MFr Bld: 5.8 % — ABNORMAL HIGH (ref <5.7)
Mean Plasma Glucose: 120 mg/dL

## 2016-05-03 LAB — MAGNESIUM: Magnesium: 1.7 mg/dL (ref 1.5–2.5)

## 2016-05-12 ENCOUNTER — Other Ambulatory Visit: Payer: Self-pay | Admitting: Internal Medicine

## 2016-06-11 ENCOUNTER — Other Ambulatory Visit: Payer: Self-pay | Admitting: Internal Medicine

## 2016-06-17 ENCOUNTER — Other Ambulatory Visit: Payer: Self-pay | Admitting: *Deleted

## 2016-06-17 MED ORDER — MONTELUKAST SODIUM 10 MG PO TABS: 10.0000 mg | ORAL_TABLET | Freq: Every day | ORAL | 2 refills | Status: DC

## 2016-07-17 ENCOUNTER — Encounter: Payer: Self-pay | Admitting: Physician Assistant

## 2016-07-27 ENCOUNTER — Encounter: Payer: Self-pay | Admitting: *Deleted

## 2016-08-07 ENCOUNTER — Encounter: Payer: Self-pay | Admitting: Physician Assistant

## 2016-08-07 ENCOUNTER — Ambulatory Visit (INDEPENDENT_AMBULATORY_CARE_PROVIDER_SITE_OTHER): Payer: 59 | Admitting: Physician Assistant

## 2016-08-07 VITALS — BP 130/90 | HR 72 | Temp 97.4°F | Resp 16 | Ht 64.5 in | Wt 204.0 lb

## 2016-08-07 DIAGNOSIS — S060X0A Concussion without loss of consciousness, initial encounter: Secondary | ICD-10-CM | POA: Diagnosis not present

## 2016-08-07 NOTE — Patient Instructions (Signed)
Concussion, Adult A concussion, or closed-head injury, is a brain injury caused by a direct blow to the head or by a quick and sudden movement (jolt) of the head or neck. Concussions are usually not life-threatening. Even so, the effects of a concussion can be serious. If you have had a concussion before, you are more likely to experience concussion-like symptoms after a direct blow to the head. What are the causes? This condition is caused by:  Direct blow to the head, such as from running into another player during a soccer game, being hit in a fight, or hitting your head on a hard surface.  A jolt of the head or neck that causes the brain to move back and forth inside the skull, such as in a car crash. What are the signs or symptoms? The signs of a concussion can be hard to notice. Early on, they may be missed by you, family members, and health care providers. You may look fine but act or feel differently. Symptoms are usually temporary, but they may last for days, weeks, or even longer. Some symptoms may appear right away while others may not show up for hours or days. Every head injury is different. Symptoms may include:  Mild to moderate headaches that will not go away.  A feeling of pressure inside your head.  Having more trouble than usual:  Learning or remembering things you have heard.  Answering questions.  Paying attention or concentrating.  Organizing daily tasks.  Making decisions and solving problems.  Slowness in thinking, acting or reacting, speaking, or reading.  Getting lost or being easily confused.  Feeling tired all the time or lacking energy (fatigued).  Feeling drowsy.  Sleep disturbances.  Sleeping more than usual.  Sleeping less than usual.  Trouble falling asleep.  Trouble sleeping (insomnia).  Loss of balance or feeling lightheaded or dizzy.  Nausea or vomiting.  Numbness or tingling.  Increased sensitivity  to:  Sounds.  Lights.  Distractions.  Vision problems or eyes that tire easily.  Diminished sense of taste or smell.  Ringing in the ears.  Mood changes such as feeling sad or anxious.  Becoming easily irritated or angry for little or no reason.  Lack of motivation.  Seeing or hearing things other people do not see or hear (hallucinations). How is this diagnosed? This condition is diagnosed based on:  Medical history. Your health care provider will ask for a description of your activity and your injury.  Symptoms. Your health care provider will ask whether you passed out (lost consciousness) and whether you are having trouble remembering events that happened right before and during your injury. You may also have other tests, including:  A brain scan to look for signs of injury to the brain. You may still have a concussion even if the scan shows no injury.  Blood tests to be sure other problems are not present. How is this treated? This condition is usually treated in an emergency department, in an urgent care, or at a clinic.  Tell your health care provider if:  You are taking any medicines, including prescription medicines, over-the-counter medicines, and natural remedies. Some medicines, such as blood thinners (anticoagulants) and aspirin, may increase the chance of complications, such as bleeding.  You are taking or have taken alcohol or illegal drugs. Alcohol and certain other drugs may slow your recovery and can put you at risk of further injury.  Your health care provider will send you home with important instructions  to follow.  How fast you will recover from a concussion depends on many factors, such as how severe your concussion is, what part of your brain was injured, how old you are, and how healthy you were before the concussion. Recovery can take time.  Most people with mild injuries recover fully.  Recovery is slower in older people.  People who have had  a concussion in the past or have other medical problems may take longer to recover. Follow these instructions at home: Activity  Limit activities that require a lot of thought or concentration. These include:  Doing homework or job-related work.  Watching TV.  Working on the computer.  Rest. Rest helps the brain to heal. Make sure you:  Get plenty of sleep at night. Avoid staying up late at night.  Keep the same bedtime hours on weekends and weekdays.  Rest during the day. Take daytime naps or rest breaks when you feel tired.  Avoid situations that increase your risk for another head injury (football, hockey, soccer, basketball, martial arts, downhill snow sports, and horseback riding). Your condition will get worse every time you experience a concussion.  Ask your health care provider when you can return to your normal activities, such as school, work, athletics, driving, riding a bicycle, or operating heavy machinery. Your ability to react may be slower after a brain injury. Never do these activities if you are dizzy.  Return to your normal activities slowly, not all at once. You must give your body and brain enough time for recovery. Preventing another concussion  It is very important to avoid another brain injury, especially as you recover. In rare cases, another injury can lead to permanent brain damage, brain swelling, or death. The risk of this is greatest during the first 7-10 days after a head injury. Avoid injuries by:  Wearing a seat belt when riding in a car.  Limiting alcohol intake to no more than 1 drink a day for non-pregnant women and 2 drinks a day for men. One drink equals 12 oz. of beer, 5 oz. of wine, or 1 oz. of hard liquor.  Wearing a helmet when biking, skiing, skateboarding, skating, or doing similar activities.  Avoiding activities that could lead to a second concussion, such as contact or recreational sports, until your health care provider says it is  okay.  Taking safety measures in your home.  Remove clutter and tripping hazards from floors and stairways.  Use grab bars in bathrooms and handrails by stairs.  Place non-slip mats on floors and in bathtubs.  Improve lighting in dim areas. General instructions  Take over-the-counter and prescription medicines only as told by your health care provider.  Do not drink alcohol until your health care provider says you are well enough to do so.  If it is harder than usual to remember things, write them down.  If you are easily distracted, try to do one thing at a time. For example, do not try to watch TV while fixing dinner.  Talk with family members or close friends when making important decisions.  Watch your symptoms and tell others to do the same. Complications sometimes occur after a concussion. Older adults with a brain injury may have a higher risk of serious complications, such as a blood clot on the brain.  Tell your teachers, school nurse, school counselor, coach, athletic trainer, or work Freight forwarder about your injury, symptoms, and restrictions. Tell them about what you can or cannot do. They should watch  for:  Increased problems with attention or concentration.  Increased difficulty remembering or learning new information.  Increased time needed to complete tasks or assignments.  Increased irritability or decreased ability to cope with stress.  Increased symptoms.  Keep all follow-up visits as told by your health care provider. This is important because repeated evaluation of your symptoms is recommended for your recovery. Contact a health care provider if:  You have increased problems paying attention or concentrating.  You have increased difficulty remembering or learning new information.  You need more time to complete tasks or assignments than before.  You have increased irritability or decreased ability to cope with stress.  You have more symptoms than  before. Seek medical care if you have any of the following symptoms for more than 2 weeks after your injury:  Long-lasting (chronic) headaches.  Dizziness or balance problems.  Nausea.  Vision problems.  Increased sensitivity to noise or light.  Depression or mood swings.  Anxiety or irritability.  Memory problems.  Difficulty concentrating or paying attention.  Sleep problems.  Feeling tired all the time. Get help right away if:  You have severe or worsening headaches. These may be a sign of a blood clot in the brain.  You have weakness (even if only in one hand, leg, or part of the face).  You have numbness.  You have decreased coordination.  You vomit repeatedly.  You have increased sleepiness.  One pupil is larger than the other.  You have convulsions.  You have slurred speech.  You have increased confusion. This may be a sign of a blood clot in the brain.  You have increased restlessness, agitation, or irritability.  You are unable to recognize people or places.  You have neck pain.  It is difficult to wake you up.  You have unusual behavior changes.  You lose consciousness. This information is not intended to replace advice given to you by your health care provider. Make sure you discuss any questions you have with your health care provider. Document Released: 11/02/2003 Document Revised: 01/18/2016 Document Reviewed: 03/04/2013 Elsevier Interactive Patient Education  2017 [employer].

## 2016-08-07 NOTE — Progress Notes (Signed)
Subjective:    Patient ID: Kristina Webb, female    DOB: 1956/01/25, 60 y.o.   MRN: [id number]  HPI 60 y.o. WF presents with vision changes after a fall. She was going down her steps yesterday when she hit a patch of ice, she fell forward and hit her left shoulder/arm and while she was falling she hit back of her neck/head on the railing while falling, no LOC. Has knot on her head, saw chiropractor yesterday after the fall for adjustment. Then today she has been having blurry vision both eyes. No nausea, vomiting, no HA today, no weakness, no speech issues, no imbalance or dizziness. She is not on a blood thinner.   Blood pressure 130/90, pulse 72, temperature 97.4 F (36.3 C), resp. rate 16, height 5' 4.5" (1.638 m), weight 204 lb (92.5 kg), SpO2 98 %.  Medications Current Outpatient Prescriptions on File Prior to Visit  Medication Sig  . albuterol (PROAIR HFA) 108 (90 BASE) MCG/ACT inhaler Inhale 2 puffs into the lungs every 6 (six) hours as needed for wheezing.  Marland Kitchen amitriptyline (ELAVIL) 10 MG tablet TAKE 1 TO 2 TABLETS BY MOUTH DAILY AT BEDTIME (Patient taking differently: TAKE 0.5 TO 1 TABLET BY MOUTH DAILY AT BEDTIME as needed for sleep)  . budesonide-formoterol (SYMBICORT) 80-4.5 MCG/ACT inhaler Inhale 2 puffs into the lungs 2 (two) times daily. (Patient taking differently: Inhale 2 puffs into the lungs daily as needed (for wheezing or shortness of breath). )  . chlorpheniramine (CHLOR-TRIMETON) 4 MG tablet Take 4 mg by mouth every 6 (six) hours as needed for allergies.  . Cholecalciferol (VITAMIN D3) 10000 units TABS Take 10,000 Units by mouth daily.  . Cyanocobalamin (VITAMIN B 12 PO) Take 1 tablet by mouth daily.  . cyclobenzaprine (FLEXERIL) 10 MG tablet Take 1/2 to 1 tablet 2 to 3 x day if needed for muscle spasm (Patient taking differently: Take 20 mg by mouth at bedtime as needed for muscle spasms. )  . diclofenac (FLECTOR) 1.3 % PTCH Place 1 patch onto the skin every morning.  (Patient taking differently: Place 1 patch onto the skin daily as needed (for back pain). )  . ESTRACE VAGINAL 0.1 MG/GM vaginal cream Place 0.1 mg vaginally 2 (two) times a week.   . furosemide (LASIX) 40 MG tablet TAKE 1 TABLET EVERY MORNING FOR BLOOD PRESSURE AND FLUID (Patient taking differently: TAKE 0.5 TABLET EVERY MORNING FOR BLOOD PRESSURE AND FLUID)  . levothyroxine (SYNTHROID, LEVOTHROID) 75 MCG tablet TAKE 1 TABLET DAILY  . losartan (COZAAR) 100 MG tablet TAKE 1 TABLET DAILY  . metFORMIN (GLUCOPHAGE-XR) 500 MG 24 hr tablet TAKE 1 TABLET BY MOUTH TWICE DAILY WITH FOOD  . montelukast (SINGULAIR) 10 MG tablet Take 1 tablet (10 mg total) by mouth daily.  . Multiple Vitamins-Minerals (ZINC PO) Take 1 tablet by mouth daily.  . NUCYNTA 50 MG TABS tablet TAKE 1 TABLET (50 mg) four times a day as needed for severe headache  . oxyCODONE-acetaminophen (ROXICET) 5-325 MG tablet Take 1-2 tablets by mouth every 4 (four) hours as needed.  . pantoprazole (PROTONIX) 40 MG tablet TAKE 1 TABLET DAILY  . phentermine (ADIPEX-P) 37.5 MG tablet Take 1 tablet (37.5 mg total) by mouth daily before breakfast.  . potassium chloride SA (K-DUR,KLOR-CON) 20 MEQ tablet TAKE 1 TABLET DAILY (Patient taking differently: TAKE 1 TABLET DAILY as needed for low potassium)  . ranitidine (ZANTAC) 300 MG tablet TAKE TWICE A DAY WHILE TRYING TO GET OFF PPI  .  sertraline (ZOLOFT) 100 MG tablet Take 1-2 tablets daily for anxiety (Patient taking differently: Take 100 mg by mouth daily. )  . SUMAtriptan (IMITREX) 100 MG tablet Take 1 tablet (100 mg total) by mouth once as needed for migraine. May repeat in 2 hours if headache persists or recurs.   No current facility-administered medications on file prior to visit.     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   Review of Systems  Constitutional: Negative.  Negative for chills, fatigue and fever.  HENT: Negative.   Eyes: Positive for visual disturbance. Negative for photophobia, pain, discharge, redness and itching.  Respiratory: Negative.   Cardiovascular: Negative.  Negative for chest pain.  Gastrointestinal: Negative.  Negative for nausea.  Genitourinary: Negative.   Musculoskeletal: Positive for arthralgias, myalgias and neck pain. Negative for back pain, gait problem, joint swelling and neck stiffness.  Skin: Positive for wound (small hematoma on right occipital).  Neurological: Negative for dizziness, tremors, seizures, syncope, facial asymmetry, speech difficulty, weakness, light-headedness, numbness and headaches.  Hematological: Does not bruise/bleed easily.  Psychiatric/Behavioral: Negative.  Negative for sleep disturbance.       Objective:   Physical Exam  Constitutional: She is oriented to person, place, and time. She appears well-nourished. No distress.  HENT:  Head: Normocephalic. Head is with contusion (right occipital). Head is without raccoon's eyes, without Battle's sign, without laceration, without right periorbital erythema and without left periorbital erythema.  Right Ear: External ear normal.  Left Ear: External ear normal.  Nose: Nose normal.  Mouth/Throat: Oropharynx is clear and moist.  Cardiovascular: Normal rate and regular rhythm.   No murmur heard. Pulmonary/Chest: Breath sounds normal. She has no wheezes.  Abdominal: Soft. Bowel sounds are normal. There is no tenderness.  Musculoskeletal: Normal range of motion. She exhibits tenderness (neck/trap).  Full ROM neck, nontender C7, + muscular/trap tenderness, normal distal strength and reflexes bilateral  Neurological: She is alert and oriented to person, place, and time. She has normal reflexes. She  displays normal reflexes. No cranial nerve deficit. She exhibits normal muscle tone. Coordination normal.  Skin: Skin is warm and dry. No rash noted. She is not diaphoretic.      Assessment & Plan:  Concussion s/p fall Follow up eye doctor Given information about concussion if any HA, changes vision/speech, imbalance, weakness, N/V go to the ER The patient was advised to call immediately if she has any concerning symptoms in the interval. The patient voices understanding of current treatment options and is in agreement with the current care plan.The patient knows to call the clinic with any problems, questions or concerns or go to the ER if any further progression of symptoms.   Future Appointments Date Time Provider [place]  09/10/2016 3:00 PM Vicie Mutters, PA-C [id number] None

## 2016-08-27 ENCOUNTER — Telehealth: Payer: Self-pay

## 2016-08-27 ENCOUNTER — Telehealth: Payer: Self-pay | Admitting: Physician Assistant

## 2016-08-27 MED ORDER — PREDNISONE 20 MG PO TABS
ORAL_TABLET | ORAL | 0 refills | Status: DC
Start: 2016-08-27 — End: 2016-09-10

## 2016-08-27 NOTE — Telephone Encounter (Signed)
-----   Message from Elenor Quinones, [place] sent at 08/27/2016  2:56 PM EST ----- Regarding: Rx Pt states she would like the prednisone called into her pharmacy but states that she gets like this about the same time every year and would like an ABX called in as well because she can't get sick with everything that's going on. Advise please.

## 2016-08-27 NOTE — Telephone Encounter (Signed)
Informed pt of Rx that was sent & that a message was sent to her thru myChart.

## 2016-08-29 ENCOUNTER — Other Ambulatory Visit: Payer: Self-pay

## 2016-08-29 ENCOUNTER — Other Ambulatory Visit: Payer: Self-pay | Admitting: *Deleted

## 2016-08-29 MED ORDER — SERTRALINE HCL 100 MG PO TABS: ORAL_TABLET | ORAL | 3 refills | Status: DC

## 2016-08-29 MED ORDER — ESTRACE 0.1 MG/GM VA CREA: 1.0000 | TOPICAL_CREAM | VAGINAL | 1 refills | Status: DC

## 2016-08-29 MED ORDER — PANTOPRAZOLE SODIUM 40 MG PO TBEC: 40.0000 mg | DELAYED_RELEASE_TABLET | Freq: Every day | ORAL | 1 refills | Status: DC

## 2016-08-29 MED ORDER — FUROSEMIDE 40 MG PO TABS: ORAL_TABLET | ORAL | 1 refills | Status: DC

## 2016-08-29 MED ORDER — LOSARTAN POTASSIUM 100 MG PO TABS: 100.0000 mg | ORAL_TABLET | Freq: Every day | ORAL | 1 refills | Status: DC

## 2016-09-03 ENCOUNTER — Other Ambulatory Visit: Payer: Self-pay | Admitting: *Deleted

## 2016-09-03 MED ORDER — MONTELUKAST SODIUM 10 MG PO TABS: 10.0000 mg | ORAL_TABLET | Freq: Every day | ORAL | 3 refills | Status: DC

## 2016-09-03 MED ORDER — RANITIDINE HCL 300 MG PO TABS: ORAL_TABLET | ORAL | 3 refills | Status: DC

## 2016-09-03 MED ORDER — LEVOTHYROXINE SODIUM 75 MCG PO TABS: 75.0000 ug | ORAL_TABLET | Freq: Every day | ORAL | 3 refills | Status: DC

## 2016-09-04 ENCOUNTER — Other Ambulatory Visit: Payer: Self-pay | Admitting: Physician Assistant

## 2016-09-04 MED ORDER — BENZONATATE 100 MG PO CAPS: 200.0000 mg | ORAL_CAPSULE | Freq: Three times a day (TID) | ORAL | 0 refills | Status: DC | PRN

## 2016-09-04 MED ORDER — PROMETHAZINE-DM 6.25-15 MG/5ML PO SYRP: 5.0000 mL | ORAL_SOLUTION | Freq: Four times a day (QID) | ORAL | 1 refills | Status: DC | PRN

## 2016-09-06 ENCOUNTER — Telehealth: Payer: Self-pay

## 2016-09-06 ENCOUNTER — Other Ambulatory Visit: Payer: Self-pay | Admitting: Physician Assistant

## 2016-09-06 MED ORDER — AZITHROMYCIN 250 MG PO TABS: ORAL_TABLET | ORAL | 1 refills | Status: DC

## 2016-09-06 NOTE — Telephone Encounter (Signed)
LVM Informing pt that a Z-pak was sent to her pharmacy by provider.   SXS: chills,cough,wheezing,SOB, mucus discharge & chest thightnes were the pt's complaints.

## 2016-09-10 ENCOUNTER — Encounter: Payer: Self-pay | Admitting: Physician Assistant

## 2016-09-10 ENCOUNTER — Ambulatory Visit (INDEPENDENT_AMBULATORY_CARE_PROVIDER_SITE_OTHER): Payer: 59 | Admitting: Physician Assistant

## 2016-09-10 VITALS — BP 128/80 | HR 69 | Temp 97.9°F | Resp 16 | Ht 63.0 in | Wt 197.0 lb

## 2016-09-10 DIAGNOSIS — Z136 Encounter for screening for cardiovascular disorders: Secondary | ICD-10-CM

## 2016-09-10 DIAGNOSIS — Q8589 Other phakomatoses, not elsewhere classified: Secondary | ICD-10-CM

## 2016-09-10 DIAGNOSIS — Z23 Encounter for immunization: Secondary | ICD-10-CM | POA: Diagnosis not present

## 2016-09-10 DIAGNOSIS — J45909 Unspecified asthma, uncomplicated: Secondary | ICD-10-CM

## 2016-09-10 DIAGNOSIS — M5442 Lumbago with sciatica, left side: Secondary | ICD-10-CM

## 2016-09-10 DIAGNOSIS — Q858 Other phakomatoses, not elsewhere classified: Secondary | ICD-10-CM

## 2016-09-10 DIAGNOSIS — R05 Cough: Secondary | ICD-10-CM

## 2016-09-10 DIAGNOSIS — Z79899 Other long term (current) drug therapy: Secondary | ICD-10-CM

## 2016-09-10 DIAGNOSIS — F329 Major depressive disorder, single episode, unspecified: Secondary | ICD-10-CM

## 2016-09-10 DIAGNOSIS — N182 Chronic kidney disease, stage 2 (mild): Secondary | ICD-10-CM

## 2016-09-10 DIAGNOSIS — G4733 Obstructive sleep apnea (adult) (pediatric): Secondary | ICD-10-CM

## 2016-09-10 DIAGNOSIS — I10 Essential (primary) hypertension: Secondary | ICD-10-CM | POA: Diagnosis not present

## 2016-09-10 DIAGNOSIS — E785 Hyperlipidemia, unspecified: Secondary | ICD-10-CM | POA: Diagnosis not present

## 2016-09-10 DIAGNOSIS — K2100 Gastro-esophageal reflux disease with esophagitis, without bleeding: Secondary | ICD-10-CM

## 2016-09-10 DIAGNOSIS — R6889 Other general symptoms and signs: Secondary | ICD-10-CM

## 2016-09-10 DIAGNOSIS — R059 Cough, unspecified: Secondary | ICD-10-CM

## 2016-09-10 DIAGNOSIS — E119 Type 2 diabetes mellitus without complications: Secondary | ICD-10-CM

## 2016-09-10 DIAGNOSIS — E559 Vitamin D deficiency, unspecified: Secondary | ICD-10-CM

## 2016-09-10 DIAGNOSIS — Z0001 Encounter for general adult medical examination with abnormal findings: Secondary | ICD-10-CM

## 2016-09-10 DIAGNOSIS — F32A Depression, unspecified: Secondary | ICD-10-CM

## 2016-09-10 DIAGNOSIS — D649 Anemia, unspecified: Secondary | ICD-10-CM

## 2016-09-10 LAB — CBC WITH DIFFERENTIAL/PLATELET
Basophils Absolute: 0 cells/uL (ref 0–200)
Basophils Relative: 0 %
Eosinophils Absolute: 396 cells/uL (ref 15–500)
Eosinophils Relative: 3 %
HCT: 39.9 % (ref 35.0–45.0)
Hemoglobin: 13 g/dL (ref 11.7–15.5)
Lymphocytes Relative: 34 %
Lymphs Abs: 4488 cells/uL — ABNORMAL HIGH (ref 850–3900)
MCH: 26.6 pg — ABNORMAL LOW (ref 27.0–33.0)
MCHC: 32.6 g/dL (ref 32.0–36.0)
MCV: 81.8 fL (ref 80.0–100.0)
MPV: 9.2 fL (ref 7.5–12.5)
Monocytes Absolute: 792 cells/uL (ref 200–950)
Monocytes Relative: 6 %
Neutro Abs: 7524 cells/uL (ref 1500–7800)
Neutrophils Relative %: 57 %
Platelets: 292 10*3/uL (ref 140–400)
RBC: 4.88 MIL/uL (ref 3.80–5.10)
RDW: 15.9 % — ABNORMAL HIGH (ref 11.0–15.0)
WBC: 13.2 10*3/uL — ABNORMAL HIGH (ref 3.8–10.8)

## 2016-09-10 MED ORDER — METFORMIN HCL ER 500 MG PO TB24: 500.0000 mg | ORAL_TABLET | Freq: Two times a day (BID) | ORAL | 1 refills | Status: DC

## 2016-09-10 MED ORDER — ALBUTEROL SULFATE HFA 108 (90 BASE) MCG/ACT IN AERS: 2.0000 | INHALATION_SPRAY | Freq: Four times a day (QID) | RESPIRATORY_TRACT | 3 refills | Status: DC | PRN

## 2016-09-10 NOTE — Patient Instructions (Signed)
Do the breo daily, brush your teeth after you use it Get the chest xray   Simple math prevails.    1st - exercise does not produce significant weight loss - at best one converts fat into muscle , "bulks up", loses inches, but usually stays "weight neutral"     2nd - think of your body weightas a check book: If you eat more calories than you burn up - you save money or gain weight .... Or if you spend more money than you put in the check book, ie burn up more calories than you eat, then you lose weight     3rd - if you walk or run 1 mile, you burn up 100 calories - you have to burn up 3,500 calories to lose 1 pound, ie you have to walk/run 35 miles to lose 1 measly pound. So if you want to lose 10 #, then you have to walk/run 350 miles, so.... clearly exercise is not the solution.     4. So if you consume 1,500 calories, then you have to burn up the equivalent of 15 miles to stay weight neutral - It also stands to reason that if you consume 1,500 cal/day and don't lose weight, then you must be burning up about 1,500 cals/day to stay weight neutral.     5. If you really want to lose weight, you must cut your calorie intake 300 calories /day and at that rate you should lose about 1 # every 3 days.   6. Please purchase Dr Fara Olden Fuhrman's book(s) "The End of Dieting" & "Eat to Live" . It has some great concepts and recipes.

## 2016-09-10 NOTE — Progress Notes (Signed)
Complete Physical  Assessment and Plan:  Essential hypertension - EKG 12-Lead  Asthma, mild intermittent, uncomplicated Albuterol, symbicort, CXR   OSA (obstructive sleep apnea) Suggest CPAP  Gastroesophageal reflux disease with esophagitis Continue protonix and can try adding zantac at night for 1 week due to cough   CKD (chronic kidney disease) stage 2, GFR 60-89 ml/min Check BMP   Anemia, unspecified anemia type Check CBC, she is off iron  Depression Does not want to change meds at this time but discussed switching from zoloft to cymbalta due to migraines/pain  Hyperlipemia -continue medications, check lipids, decrease fatty foods, increase activity.   Obesity Obesity with co morbidities- long discussion about weight loss, diet, and exercise, continue phentermine through holidays will decrease use after next visit.   Encounter for general adult medical examination with abnormal findings  Cough -     DG Chest 2 View; Future -     albuterol (PROAIR HFA) 108 (90 Base) MCG/ACT inhaler; Inhale 2 puffs into the lungs every 6 (six) hours as needed for wheezing.  Need for prophylactic vaccination against Streptococcus pneumoniae (pneumococcus) -     Pneumococcal polysaccharide vaccine 23-valent greater than or equal to 2yo subcutaneous/IM   Discussed med's effects and SE's. Screening labs and tests as requested with regular follow-up as recommended.  HPI  61 y.o. female  presents for a complete physical.   Her blood pressure has been controlled at home, today their BP is BP: 128/80 She does workout. She denies chest pain, shortness of breath, dizziness.  She is not on cholesterol medication and denies myalgias. Her cholesterol is at goal. The cholesterol last visit was:   Lab Results  Component Value Date   CHOL 195 05/02/2016   HDL 67 05/02/2016   LDLCALC 102 05/02/2016   TRIG 131 05/02/2016   CHOLHDL 2.9 05/02/2016    She has been working on diet and exercise for  prediabetes, she is on metformin, and denies paresthesia of the feet, polydipsia and polyuria. Last A1C in the office was:  Lab Results  Component Value Date   HGBA1C 5.8 (H) 05/02/2016   Patient is on Vitamin D supplement.   Lab Results  Component Value Date   VD25OH 31 10/17/2015     BMI is Body mass index is 34.9 kg/m., she is working on diet and exercise and has done well. She is off the phentermine and doing well, she is doing weight watchers with work.  Wt Readings from Last 3 Encounters:  09/10/16 197 lb (89.4 kg)  08/07/16 204 lb (92.5 kg)  05/02/16 205 lb 6.4 oz (93.2 kg)   She is on thyroid medication. Her medication was not changed last visit. Patient denies nervousness, palpitations and weight changes.  Lab Results  Component Value Date   TSH 3.26 05/02/2016  .  She has asthma, she is on albuterol PRN, treated recently for cough/bronchitis with prednisone and ABX, she is doing better.   Current Medications:  Current Outpatient Prescriptions on File Prior to Visit  Medication Sig Dispense Refill  . amitriptyline (ELAVIL) 10 MG tablet TAKE 1 TO 2 TABLETS BY MOUTH DAILY AT BEDTIME (Patient taking differently: TAKE 0.5 TO 1 TABLET BY MOUTH DAILY AT BEDTIME as needed for sleep) 180 tablet 1  . budesonide-formoterol (SYMBICORT) 80-4.5 MCG/ACT inhaler Inhale 2 puffs into the lungs 2 (two) times daily. (Patient taking differently: Inhale 2 puffs into the lungs daily as needed (for wheezing or shortness of breath). )    . chlorpheniramine (  CHLOR-TRIMETON) 4 MG tablet Take 4 mg by mouth every 6 (six) hours as needed for allergies.    . Cholecalciferol (VITAMIN D3) 10000 units TABS Take 10,000 Units by mouth daily.    . Cyanocobalamin (VITAMIN B 12 PO) Take 1 tablet by mouth daily.    . cyclobenzaprine (FLEXERIL) 10 MG tablet Take 1/2 to 1 tablet 2 to 3 x day if needed for muscle spasm (Patient taking differently: Take 20 mg by mouth at bedtime as needed for muscle spasms. ) 90  tablet 1  . diclofenac (FLECTOR) 1.3 % PTCH Place 1 patch onto the skin every morning. (Patient taking differently: Place 1 patch onto the skin daily as needed (for back pain). ) 30 patch 3  . ESTRACE VAGINAL 0.1 MG/GM vaginal cream Place 1 Applicatorful vaginally 2 (two) times a week. 127.5 g 1  . furosemide (LASIX) 40 MG tablet TAKE 1 TABLET EVERY MORNING FOR BLOOD PRESSURE AND FLUID 90 tablet 1  . levothyroxine (SYNTHROID, LEVOTHROID) 75 MCG tablet Take 1 tablet (75 mcg total) by mouth daily. 90 tablet 3  . losartan (COZAAR) 100 MG tablet Take 1 tablet (100 mg total) by mouth daily. 90 tablet 1  . montelukast (SINGULAIR) 10 MG tablet Take 1 tablet (10 mg total) by mouth daily. 90 tablet 3  . Multiple Vitamins-Minerals (ZINC PO) Take 1 tablet by mouth daily.    . NUCYNTA 50 MG TABS tablet TAKE 1 TABLET (50 mg) four times a day as needed for severe headache  0  . oxyCODONE-acetaminophen (ROXICET) 5-325 MG tablet Take 1-2 tablets by mouth every 4 (four) hours as needed. 30 tablet 0  . pantoprazole (PROTONIX) 40 MG tablet Take 1 tablet (40 mg total) by mouth daily. 90 tablet 1  . potassium chloride SA (K-DUR,KLOR-CON) 20 MEQ tablet TAKE 1 TABLET DAILY (Patient taking differently: TAKE 1 TABLET DAILY as needed for low potassium) 90 tablet 1  . promethazine-dextromethorphan (PROMETHAZINE-DM) 6.25-15 MG/5ML syrup Take 5 mLs by mouth 4 (four) times daily as needed for cough. 240 mL 1  . ranitidine (ZANTAC) 300 MG tablet TAKE TWICE A DAY WHILE TRYING TO GET OFF PPI 180 tablet 3  . sertraline (ZOLOFT) 100 MG tablet Take 1-2 tablets daily for anxiety 180 tablet 3  . SUMAtriptan (IMITREX) 100 MG tablet Take 1 tablet (100 mg total) by mouth once as needed for migraine. May repeat in 2 hours if headache persists or recurs. 10 tablet 2   No current facility-administered medications on file prior to visit.    Health Maintenance:   Immunization History  Administered Date(s) Administered  . Influenza Split  04/26/2013, 05/29/2015  . Influenza Whole 05/26/2012  . Td 08/27/2007   Tetanus: 2009 Pneumovax: DUE Prevnar due age 48 Flu vaccine: 2017 at work Zostavax: LMP:2007 CXr 2014  Pap: 2016- Dr. Perlie Gold.  MGM:12/2015 DEXA: Colonoscopy: 12/2012 normal EGD: 2010 Last Dental Exam: Dr. Lin Landsman, q  6 months.  Last Eye Exam: Dr. Claudean Kinds 12/2015, has cataracts and going next month Ct head 04/2015  Patient Care Team: Unk Pinto, MD as PCP - General (Internal Medicine) Richmond Campbell, MD as Consulting Physician (Gastroenterology) Newt Minion, MD as Consulting Physician (Orthopedic Surgery) Lennon Alstrom, MD as Consulting Physician (Neurology) Ralene Bathe, MD as Consulting Physician (Ophthalmology)  Allergies:  Allergies  Allergen Reactions  . Doxycycline Other (See Comments)    Unknown  . Levaquin [Levofloxacin In D5w] Other (See Comments)    Ankles hurt really bad  . Prednisone Swelling  Weight gain   Medical History:  Past Medical History:  Diagnosis Date  . Anemia   . Anxiety   . Arthritis    back  . Asthma   . Depression   . Deviated septum   . Diabetes ([place])   . GERD (gastroesophageal reflux disease)   . Headache   . History of hiatal hernia   . Hyperlipemia   . Hypertension   . OSA (obstructive sleep apnea)    Allergies Allergies  Allergen Reactions  . Doxycycline Other (See Comments)    Unknown  . Levaquin [Levofloxacin In D5w] Other (See Comments)    Ankles hurt really bad  . Prednisone Swelling    Weight gain    SURGICAL HISTORY She  has a past surgical history that includes Back surgery; Ankle ganglion cyst excision (Left); Breast surgery (Right); birth mark treatments; and Breast lumpectomy with radioactive seed localization (Left, 02/02/2016). FAMILY HISTORY Her family history includes Allergies in her mother; Asthma in her mother; Brain cancer in her father; Breast cancer in her mother; Emphysema in her mother; Heart disease in her  mother; Prostate cancer in her father. SOCIAL HISTORY She  reports that she has never smoked. She has never used smokeless tobacco. She reports that she does not drink alcohol or use drugs.  Review of Systems  Constitutional: Positive for malaise/fatigue. Negative for chills, diaphoresis, fever and weight loss.  HENT: Negative for congestion, ear discharge, ear pain, hearing loss, nosebleeds, sore throat and tinnitus.   Eyes: Negative.   Respiratory: Positive for cough. Negative for stridor.   Cardiovascular: Negative.   Gastrointestinal: Positive for constipation. Negative for abdominal pain, blood in stool, diarrhea, heartburn, melena, nausea and vomiting.  Genitourinary: Negative.   Musculoskeletal: Positive for back pain. Negative for falls, joint pain, myalgias and neck pain.  Skin: Negative.   Neurological: Positive for tingling (left leg) and headaches. Negative for dizziness, tremors, sensory change, speech change, focal weakness, seizures, loss of consciousness and weakness.  Psychiatric/Behavioral: Positive for depression and memory loss. Negative for hallucinations, substance abuse and suicidal ideas. The patient has insomnia. The patient is not nervous/anxious.     Physical Exam: Estimated body mass index is 34.9 kg/m as calculated from the following:   Height as of this encounter: 5\' 3"  (1.6 m).   Weight as of this encounter: 197 lb (89.4 kg). BP 128/80   Pulse 69   Temp 97.9 F (36.6 C)   Resp 16   Ht 5\' 3"  (1.6 m)   Wt 197 lb (89.4 kg)   SpO2 97%   BMI 34.90 kg/m  General Appearance: Well nourished, in no apparent distress.  Eyes: PERRLA, EOMs, conjunctiva no swelling or erythema, normal fundi and vessels.  Sinuses: No Frontal/maxillary tenderness  ENT/Mouth: Ext aud canals clear, normal light reflex with TMs without erythema, bulging. Good dentition. No erythema, swelling, or exudate on post pharynx. Tonsils not swollen or erythematous. Hearing normal.  Neck:  Supple, thyroid normal. No bruits  Respiratory: Respiratory effort normal, diffuse wheezing, BS equal bilaterally without rales, rhonchi, wheezing or stridor.  Cardio: RRR without murmurs, rubs or gallops. Brisk peripheral pulses without edema.  Chest: symmetric, with normal excursions and percussion.  Breasts: defer Abdomen: Soft, mild epigastric tenderness, with ventral hernia, no guarding, rebound, masses, or organomegaly.  Lymphatics: Non tender without lymphadenopathy.  Genitourinary: defer Musculoskeletal: Full ROM all peripheral extremities,5/5 strength, and normal gait. Skin: Port wine stain right face. Warm, dry without rashes, lesions, ecchymosis. Neuro: Cranial nerves intact,  reflexes equal bilaterally. Normal muscle tone, no cerebellar symptoms. Sensation intact.  Psych: Awake and oriented X 3, normal affect, Insight and Judgment appropriate.   EKG: WNL, IRBBB, no ST changes. AORTA SCAN: defer   Vicie Mutters 3:26 PM [facility]

## 2016-09-11 LAB — TSH: TSH: 3.27 mIU/L

## 2016-09-11 LAB — BASIC METABOLIC PANEL WITH GFR
BUN: 20 mg/dL (ref 7–25)
CO2: 29 mmol/L (ref 20–31)
Calcium: 9.3 mg/dL (ref 8.6–10.4)
Chloride: 101 mmol/L (ref 98–110)
Creat: 1.06 mg/dL — ABNORMAL HIGH (ref 0.50–1.05)
GFR, Est African American: 66 mL/min (ref >=60)
GFR, Est Non African American: 58 mL/min — ABNORMAL LOW (ref >=60)
Glucose, Bld: 103 mg/dL — ABNORMAL HIGH (ref 65–99)
Potassium: 4.4 mmol/L (ref 3.5–5.3)
Sodium: 141 mmol/L (ref 135–146)

## 2016-09-11 LAB — URINALYSIS, ROUTINE W REFLEX MICROSCOPIC
Bilirubin Urine: NEGATIVE
Glucose, UA: NEGATIVE
Hgb urine dipstick: NEGATIVE
Ketones, ur: NEGATIVE
Leukocytes, UA: NEGATIVE
Nitrite: NEGATIVE
Protein, ur: NEGATIVE
Specific Gravity, Urine: 1.02 (ref 1.001–1.035)
pH: 5.5 (ref 5.0–8.0)

## 2016-09-11 LAB — HEPATIC FUNCTION PANEL
ALT: 24 U/L (ref 6–29)
AST: 24 U/L (ref 10–35)
Albumin: 4.1 g/dL (ref 3.6–5.1)
Alkaline Phosphatase: 72 U/L (ref 33–130)
Bilirubin, Direct: 0.1 mg/dL (ref <=0.2)
Indirect Bilirubin: 0.2 mg/dL (ref 0.2–1.2)
Total Bilirubin: 0.3 mg/dL (ref 0.2–1.2)
Total Protein: 7 g/dL (ref 6.1–8.1)

## 2016-09-11 LAB — LIPID PANEL
Cholesterol: 207 mg/dL — ABNORMAL HIGH (ref <200)
HDL: 52 mg/dL (ref >50)
LDL Cholesterol: 116 mg/dL — ABNORMAL HIGH (ref <100)
Total CHOL/HDL Ratio: 4 Ratio (ref <5.0)
Triglycerides: 193 mg/dL — ABNORMAL HIGH (ref <150)
VLDL: 39 mg/dL — ABNORMAL HIGH (ref <30)

## 2016-09-11 LAB — MICROALBUMIN / CREATININE URINE RATIO
Creatinine, Urine: 203 mg/dL (ref 20–320)
Microalb Creat Ratio: 17 mcg/mg creat (ref <30)
Microalb, Ur: 3.4 mg/dL

## 2016-09-11 LAB — HEMOGLOBIN A1C
Hgb A1c MFr Bld: 5.8 % — ABNORMAL HIGH (ref <5.7)
Mean Plasma Glucose: 120 mg/dL

## 2016-09-11 LAB — INSULIN, FASTING: Insulin fasting, serum: 20.3 u[IU]/mL — ABNORMAL HIGH (ref 2.0–19.6)

## 2016-09-11 LAB — VITAMIN D 25 HYDROXY (VIT D DEFICIENCY, FRACTURES): Vit D, 25-Hydroxy: 27 ng/mL — ABNORMAL LOW (ref 30–100)

## 2016-09-11 LAB — MAGNESIUM: Magnesium: 2 mg/dL (ref 1.5–2.5)

## 2016-09-12 ENCOUNTER — Other Ambulatory Visit: Payer: Self-pay | Admitting: Physician Assistant

## 2016-09-12 ENCOUNTER — Encounter: Payer: Self-pay | Admitting: Physician Assistant

## 2016-09-12 ENCOUNTER — Ambulatory Visit (HOSPITAL_COMMUNITY)
Admission: RE | Admit: 2016-09-12 | Discharge: 2016-09-12 | Disposition: A | Discharge status: Home / Self Care | Payer: 59 | Source: Ambulatory Visit | Attending: Physician Assistant | Admitting: Physician Assistant

## 2016-09-12 DIAGNOSIS — J45909 Unspecified asthma, uncomplicated: Secondary | ICD-10-CM

## 2016-09-12 DIAGNOSIS — R5383 Other fatigue: Secondary | ICD-10-CM | POA: Insufficient documentation

## 2016-09-12 DIAGNOSIS — R05 Cough: Secondary | ICD-10-CM | POA: Diagnosis not present

## 2016-09-12 DIAGNOSIS — R0789 Other chest pain: Secondary | ICD-10-CM | POA: Insufficient documentation

## 2016-09-12 DIAGNOSIS — N182 Chronic kidney disease, stage 2 (mild): Secondary | ICD-10-CM

## 2016-09-12 DIAGNOSIS — R059 Cough, unspecified: Secondary | ICD-10-CM

## 2016-09-12 DIAGNOSIS — I10 Essential (primary) hypertension: Secondary | ICD-10-CM

## 2016-09-12 MED ORDER — AMOXICILLIN-POT CLAVULANATE 875-125 MG PO TABS: 1.0000 | ORAL_TABLET | Freq: Two times a day (BID) | ORAL | 0 refills | Status: DC

## 2016-09-13 ENCOUNTER — Other Ambulatory Visit: Payer: Self-pay

## 2016-09-13 DIAGNOSIS — E119 Type 2 diabetes mellitus without complications: Secondary | ICD-10-CM

## 2016-09-13 MED ORDER — METFORMIN HCL ER 500 MG PO TB24: ORAL_TABLET | ORAL | 1 refills | Status: DC

## 2016-09-18 ENCOUNTER — Other Ambulatory Visit: Payer: Self-pay | Admitting: Internal Medicine

## 2016-09-27 ENCOUNTER — Encounter: Payer: Self-pay | Admitting: Physician Assistant

## 2016-09-27 ENCOUNTER — Ambulatory Visit (INDEPENDENT_AMBULATORY_CARE_PROVIDER_SITE_OTHER): Payer: 59 | Admitting: Physician Assistant

## 2016-09-27 VITALS — BP 124/66 | HR 78 | Temp 97.4°F | Resp 14 | Wt 198.0 lb

## 2016-09-27 DIAGNOSIS — R05 Cough: Secondary | ICD-10-CM

## 2016-09-27 DIAGNOSIS — R059 Cough, unspecified: Secondary | ICD-10-CM

## 2016-09-27 MED ORDER — DEXAMETHASONE SODIUM PHOSPHATE 100 MG/10ML IJ SOLN
10.0000 mg | Freq: Once | INTRAMUSCULAR | Status: AC
  Administered 2016-09-27: 10 mg via INTRAMUSCULAR

## 2016-09-27 MED ORDER — FLUTICASONE FUROATE-VILANTEROL 200-25 MCG/INH IN AEPB: 1.0000 | INHALATION_SPRAY | Freq: Every day | RESPIRATORY_TRACT | 3 refills | Status: DC

## 2016-09-27 NOTE — Patient Instructions (Addendum)
Continue breo once daily, wash mouth out afterwards or brush teeth Get on chlortimeton 12mg  twice a day Continue singulair mucinex once a day Get on the tessalon gel caps 3 x a day to prevent cough with syrup at night Continue albuterol PRN  Will go dexamethasone shot here in the office  Common causes of cough OR hoarseness OR sore throat:   Allergies, Viral Infections, Acid Reflux and Bacterial Infections.  1) Allergies and viral infections cause a cough OR sore throat by post nasal drip and are often worse at night, can also have sneezing, lower grade fevers, clear/yellow mucus. This is best treated with allergy medications or nasal sprays.  Please get on allegra for 1-2 weeks The strongest is allegra or fexafinadine  Cheapest at [employer], [employer], [employer]  2) Bacterial infections are more severe than allergies or viral infections with fever, teeth pain, fatigue. This can be treated with prednisone and the same over the counter medication and after 7 days can be treated with an antibiotic.   3) Silent reflux/GERD can cause a cough OR sore throat OR hoarseness WITHOUT heart burn because the esophagus that goes to the stomach and trachea that goes to the lungs are very close and when you lay down the acid can irritate your throat and lungs. This can cause hoarseness, cough, and wheezing. Please stop any alcohol or anti-inflammatories like aleve/advil/ibuprofen and start an over the counter Prilosec or omeprazole 1-2 times daily 1mins before food for 2 weeks, then switch to over the counter zantac/ratinidine or pepcid/famotadine once at night for 2 weeks.   4) sometimes irritation causes more irritation. Try voice rest, use sugar free cough drops to prevent coughing, and try to stop clearing your throat.   If you ever have a cough that does not go away after trying these things please make a follow up visit for further evaluation or we can refer you to a specialist. Or if you ever have shortness  of breath or chest pain go to the ER.

## 2016-09-27 NOTE — Progress Notes (Signed)
Subjective:    Patient ID: Kristina Webb, female    DOB: [birth year], 61 y.o.   MRN: [id number]  HPI 61 y.o. WF with history of OSA/asthma presents with cough. She was given albuterol and breo for cough 01/18, she states that it has helped the most. No fever, chills, some white/yellow mucus. She has been taking PPI and tums to make sure it is not GERD. Taking tessalon once a day and takes syrup at night.   Blood pressure 124/66, pulse 78, temperature 97.4 F (36.3 C), resp. rate 14, weight 198 lb (89.8 kg), SpO2 98 %.  Medications Current Outpatient Prescriptions on File Prior to Visit  Medication Sig  . albuterol (PROAIR HFA) 108 (90 Base) MCG/ACT inhaler Inhale 2 puffs into the lungs every 6 (six) hours as needed for wheezing.  Marland Kitchen amitriptyline (ELAVIL) 10 MG tablet TAKE 1 TO 2 TABLETS BY MOUTH DAILY AT BEDTIME (Patient taking differently: TAKE 0.5 TO 1 TABLET BY MOUTH DAILY AT BEDTIME as needed for sleep)  . budesonide-formoterol (SYMBICORT) 80-4.5 MCG/ACT inhaler Inhale 2 puffs into the lungs 2 (two) times daily. (Patient taking differently: Inhale 2 puffs into the lungs daily as needed (for wheezing or shortness of breath). )  . chlorpheniramine (CHLOR-TRIMETON) 4 MG tablet Take 4 mg by mouth every 6 (six) hours as needed for allergies.  . Cholecalciferol (VITAMIN D3) 10000 units TABS Take 10,000 Units by mouth daily.  . Cyanocobalamin (VITAMIN B 12 PO) Take 1 tablet by mouth daily.  . cyclobenzaprine (FLEXERIL) 10 MG tablet Take 1/2 to 1 tablet 2 to 3 x day if needed for muscle spasm (Patient taking differently: Take 20 mg by mouth at bedtime as needed for muscle spasms. )  . diclofenac (FLECTOR) 1.3 % PTCH Place 1 patch onto the skin every morning. (Patient taking differently: Place 1 patch onto the skin daily as needed (for back pain). )  . ESTRACE VAGINAL 0.1 MG/GM vaginal cream Place 1 Applicatorful vaginally 2 (two) times a week.  . furosemide (LASIX) 40 MG tablet TAKE 1 TABLET  EVERY MORNING FOR BLOOD PRESSURE AND FLUID  . levothyroxine (SYNTHROID, LEVOTHROID) 75 MCG tablet Take 1 tablet (75 mcg total) by mouth daily.  Marland Kitchen losartan (COZAAR) 100 MG tablet Take 1 tablet (100 mg total) by mouth daily.  . metFORMIN (GLUCOPHAGE-XR) 500 MG 24 hr tablet Take 1 tablet by mouth 2 times daily with meals  . montelukast (SINGULAIR) 10 MG tablet Take 1 tablet (10 mg total) by mouth daily.  . Multiple Vitamins-Minerals (ZINC PO) Take 1 tablet by mouth daily.  . NUCYNTA 50 MG TABS tablet TAKE 1 TABLET (50 mg) four times a day as needed for severe headache  . oxyCODONE-acetaminophen (ROXICET) 5-325 MG tablet Take 1-2 tablets by mouth every 4 (four) hours as needed.  . pantoprazole (PROTONIX) 40 MG tablet Take 1 tablet (40 mg total) by mouth daily.  . potassium chloride SA (K-DUR,KLOR-CON) 20 MEQ tablet TAKE 1 TABLET DAILY (Patient taking differently: TAKE 1 TABLET DAILY as needed for low potassium)  . promethazine-dextromethorphan (PROMETHAZINE-DM) 6.25-15 MG/5ML syrup Take 5 mLs by mouth 4 (four) times daily as needed for cough.  . ranitidine (ZANTAC) 300 MG tablet TAKE TWICE A DAY WHILE TRYING TO GET OFF PPI  . sertraline (ZOLOFT) 100 MG tablet Take 1-2 tablets daily for anxiety  . SUMAtriptan (IMITREX) 100 MG tablet Take 1 tablet (100 mg total) by mouth once as needed for migraine. May repeat in 2 hours if headache persists  or recurs.   No current facility-administered medications on file prior to visit.     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    Review of Systems  Constitutional: Negative for chills, fatigue and fever.  HENT: Positive for rhinorrhea. Negative for congestion, dental problem, ear  discharge, ear pain, nosebleeds, sinus pressure, sore throat, trouble swallowing and voice change.   Respiratory: Positive for cough. Negative for chest tightness, shortness of breath and wheezing.   Cardiovascular: Negative.   Gastrointestinal: Negative.   Genitourinary: Negative.   Musculoskeletal: Negative.   Neurological: Negative.        Objective:   Physical Exam  Constitutional: She is oriented to person, place, and time. She appears well-developed and well-nourished.  HENT:  Head: Normocephalic and atraumatic.  Right Ear: External ear normal.  Left Ear: External ear normal.  Mouth/Throat: Oropharynx is clear and moist.  Eyes: Conjunctivae and EOM are normal. Pupils are equal, round, and reactive to light.  Neck: Normal range of motion. Neck supple. No thyromegaly present.  Cardiovascular: Normal rate, regular rhythm and normal heart sounds.  Exam reveals no gallop and no friction rub.   No murmur heard. Pulmonary/Chest: Effort normal and breath sounds normal. No respiratory distress. She has no wheezes.  Abdominal: Soft. Bowel sounds are normal. She exhibits no distension and no mass. There is no tenderness. There is no rebound and no guarding.  Musculoskeletal: Normal range of motion.  Lymphadenopathy:    She has no cervical adenopathy.  Neurological: She is alert and oriented to person, place, and time. She displays normal reflexes. No cranial nerve deficit. Coordination normal.  Skin: Skin is warm and dry.  Psychiatric: She has a normal mood and affect.      Assessment & Plan:  1. Cough No signs of infection- likely due to asthma and inflammation, recent normal CXR Dex, tessalon, allergy pills, quiet the cough, if not better will refer to pumonary, continue breo - dexamethasone (DECADRON) injection 10 mg; Inject 1 mL (10 mg total) into the muscle once.

## 2016-10-01 DIAGNOSIS — H25041 Posterior subcapsular polar age-related cataract, right eye: Secondary | ICD-10-CM | POA: Diagnosis not present

## 2016-10-17 ENCOUNTER — Other Ambulatory Visit: Payer: 59

## 2016-10-17 DIAGNOSIS — N182 Chronic kidney disease, stage 2 (mild): Secondary | ICD-10-CM

## 2016-10-17 LAB — BASIC METABOLIC PANEL WITH GFR
BUN: 19 mg/dL (ref 7–25)
CO2: 26 mmol/L (ref 20–31)
Calcium: 9.2 mg/dL (ref 8.6–10.4)
Chloride: 104 mmol/L (ref 98–110)
Creat: 0.85 mg/dL (ref 0.50–1.05)
GFR, Est African American: 87 mL/min (ref >=60)
GFR, Est Non African American: 75 mL/min (ref >=60)
Glucose, Bld: 93 mg/dL (ref 65–99)
Potassium: 4.2 mmol/L (ref 3.5–5.3)
Sodium: 141 mmol/L (ref 135–146)

## 2016-10-17 LAB — CBC WITH DIFFERENTIAL/PLATELET
Basophils Absolute: 0 cells/uL (ref 0–200)
Basophils Relative: 0 %
Eosinophils Absolute: 552 cells/uL — ABNORMAL HIGH (ref 15–500)
Eosinophils Relative: 6 %
HCT: 38.6 % (ref 35.0–45.0)
Hemoglobin: 12.4 g/dL (ref 11.7–15.5)
Lymphocytes Relative: 32 %
Lymphs Abs: 2944 cells/uL (ref 850–3900)
MCH: 26.2 pg — ABNORMAL LOW (ref 27.0–33.0)
MCHC: 32.1 g/dL (ref 32.0–36.0)
MCV: 81.4 fL (ref 80.0–100.0)
MPV: 9.6 fL (ref 7.5–12.5)
Monocytes Absolute: 460 cells/uL (ref 200–950)
Monocytes Relative: 5 %
Neutro Abs: 5244 cells/uL (ref 1500–7800)
Neutrophils Relative %: 57 %
Platelets: 274 10*3/uL (ref 140–400)
RBC: 4.74 MIL/uL (ref 3.80–5.10)
RDW: 15.5 % — ABNORMAL HIGH (ref 11.0–15.0)
WBC: 9.2 10*3/uL (ref 3.8–10.8)

## 2016-10-30 DIAGNOSIS — Z01419 Encounter for gynecological examination (general) (routine) without abnormal findings: Secondary | ICD-10-CM | POA: Diagnosis not present

## 2016-10-30 DIAGNOSIS — N952 Postmenopausal atrophic vaginitis: Secondary | ICD-10-CM | POA: Diagnosis not present

## 2016-11-04 ENCOUNTER — Ambulatory Visit (INDEPENDENT_AMBULATORY_CARE_PROVIDER_SITE_OTHER): Payer: 59 | Admitting: Allergy & Immunology

## 2016-11-04 ENCOUNTER — Encounter: Payer: Self-pay | Admitting: Allergy & Immunology

## 2016-11-04 VITALS — BP 118/74 | HR 71 | Temp 97.8°F | Resp 20 | Ht 62.5 in | Wt 193.8 lb

## 2016-11-04 DIAGNOSIS — D721 Eosinophilia, unspecified: Secondary | ICD-10-CM

## 2016-11-04 DIAGNOSIS — J3089 Other allergic rhinitis: Secondary | ICD-10-CM | POA: Diagnosis not present

## 2016-11-04 DIAGNOSIS — K219 Gastro-esophageal reflux disease without esophagitis: Secondary | ICD-10-CM | POA: Diagnosis not present

## 2016-11-04 DIAGNOSIS — J4540 Moderate persistent asthma, uncomplicated: Secondary | ICD-10-CM | POA: Diagnosis not present

## 2016-11-04 MED ORDER — FLUTICASONE FUROATE-VILANTEROL 200-25 MCG/INH IN AEPB: 1.0000 | INHALATION_SPRAY | Freq: Every day | RESPIRATORY_TRACT | 3 refills | Status: DC

## 2016-11-04 NOTE — Progress Notes (Signed)
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 Mild persistent asthma, uncomplicated - Spirometry was normal but it did improve with albuterol nebulizer treatment. - Continue with Breo but use one inhalation once daily. - Daily controller medication(s): Breo 200/25 one inhalation once daily - Rescue medications: ProAir 4 puffs every 4-6 hours as needed - Asthma control goals:  * Full participation in all desired activities (may need albuterol before activity) * Albuterol use two time or less a week on average (not counting use with activity) * Cough interfering with sleep two time or less a month * Oral steroids no more than once a year * No hospitalizations  2. Chronic nonseasonal allergic  - Testing was positive to cottonwood, red cedar, dust mite, molds, cockroach, and cat. - Avoidance measures provided.  - Use nasal saline rinses twice daily before using Flonase.  - Use Flonase one spray per nostril twice daily (once in the morning and once at night) - Continue with Singulair 10mg  daily to help with allergic rhinitis as well asthma.  - Continue with chlorpheniramine twice daily.  - Consider immunotherapy as a means of long-term control (information provided to call insurance company).  3. Gastroesophageal reflux disease - Continue with Zantac (ranitidine) but increase to 150mg  in the morning and 150mg  at night. - She would likely benefit from a PPI, however she is not willing to take it more regularly due to adverse side effects (either real or imagined). - I have not  personally looked into the data on Alzheimer's and PPIs, however this is what she is focused on at this point.   4. Eosinophilia - There are no evidence of end-organ damage per the history. - This is likely related to uncontrolled atopy.  - More serious etiologies such as hypereosinophilic syndrome are less likely since those eosinophilic counts are typically much higher (in the 1000s or more).  - We will get the allergies and asthma under control and then look at the eosinophil count once again.   4. Return in about 2 months (around 01/04/2017).   Subjective:   Kristina Webb is a 61 y.o. female presenting today for evaluation of  Chief Complaint  Patient presents with  . Nasal Congestion  . Asthma  . Cough    Kristina Webb has a history of the following: Patient Active Problem List   Diagnosis Date Noted  . Lower back pain 07/17/2015  . 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 Obesity, morbid ([place]) 03/29/2015  . CKD (chronic kidney disease) stage 2, GFR 60-89 ml/min 04/15/2014  . Hyperlipemia   . Diabetes ([place])   . Anemia   . Asthma   . Depression   . OSA (obstructive sleep apnea)   . Hypertension   . GERD (gastroesophageal reflux disease)   . Cough 12/28/2012    History obtained from: chart review and patient.  Kristina Webb was referred by Alesia Richards, MD.     Kristina Webb is a 61 y.o. female presenting for evaluation of a persistent cough. She was last seen in October 2011 by Dr. Neldon Mc. At that time, she was treated with Depo-Medrol for sinus infection. She  was continued on Asmanex as well as Nasonex. She was also encouraged to take omeprazole as well as ranitidine to control her reflux. She has not been seen since that time. Her last allergy testing was performed in 1997 and was positive only to dust mites, molds, and feathers.  Since the last visit, she has had a prolonged course of coughing for nearly three months. Symptoms started on January 2nd. She has  had a productive cough of mucous with green and gold-colored phlegm. She did not have a fever during this time. She has had no international travel. This was treated with prednisone and then azithromycin. She was then treated with amoxicillin as well as a steroid injection. Her PCP felt that she had a reaction the infection in conjunction with reflux symptoms. She has been using a humidifier at home since the coughing started.   Asthma/Respiratory Symptom History: She does not take a daily medication for her asthma at this time. She was fairly well controlled until January 2018. She previously treated symptoms with Mucinex which seems to take care of things. She does cough at night but this stops once she falls asleep according to her husband. She will cough in the mornings and will cough with laughter. She does have a prescription for ProAir but has not filled it. She then pulls out a Breo inhaler, which she says she is only using as needed. The Memory Dance was started around one month ago but she has not been using it consistently and often times multiple times during the day.   Allergic Rhinitis Symptom History: She does have a history of postnasal drip. She is taking Mucinex which is "drying [her] out". She has clear mucous when she lays down at night and when she gets settled. She does have a history of GERD and takes Zantac every morning and then will take the omeprazole only when her symptoms are particularly severe. She wants to get off of the omeprazole because "it causes Alzheimer's". She is using a nose spray but does not use it every day. She mainly uses it in the mornings and nights when she cannot sleep because she is "stopped up". She does have a cat at home which does not seem to bother her. Her newest cat is long-haired.   Eosinophilia: Kristina Webb has a recent history of eosinophilia. Her last CBC was collected in February 2018 which showed an absolute eosinophil count of 552. For the last 2 years,  her absolute eosinophil count has ranged from 400 to a maximum of 700. She has no history of end organ damage, although her kidney function has been somewhat borderline. According to a note from her primary care doctor, her kidney function improved with hydration. She has had no heart problems or neuropathy. She does have a history of allergic rhinitis which has not been very well controlled. She denies history distress including diarrhea and vomiting. She has had no international travel. She has no history of parasites.   Otherwise, there is no history of other atopic diseases, including drug allergies, food allergies, environmental allergies, stinging insect allergies, or urticaria. There is no significant infectious history. Vaccinations are up to date.    Past Medical History: Patient Active Problem List   Diagnosis Date Noted  . Lower back pain 07/17/2015  . 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 Obesity, morbid ([place]) 03/29/2015  . CKD (chronic kidney disease) stage 2, GFR 60-89 ml/min 04/15/2014  . Hyperlipemia   . Diabetes ([place])   .  Anemia   . Asthma   . Depression   . OSA (obstructive sleep apnea)   . Hypertension   . GERD (gastroesophageal reflux disease)   . Cough 12/28/2012    Medication List:  Allergies as of 11/04/2016      Reactions   Doxycycline Other (See Comments)   Unknown   Levaquin [levofloxacin In D5w] Other (See Comments)   Ankles hurt really bad   Prednisone Swelling   Weight gain      Medication List       Accurate as of 11/04/16  3:10 PM. Always use your most recent med list.          albuterol 108 (90 Base) MCG/ACT inhaler Commonly known as:  PROAIR HFA Inhale 2 puffs into the lungs every 6 (six) hours as needed for wheezing.   amitriptyline 10 MG tablet Commonly known as:  ELAVIL TAKE 1 TO 2 TABLETS BY MOUTH DAILY AT BEDTIME   benzonatate 100 MG capsule Commonly known as:  TESSALON   chlorpheniramine 4 MG tablet Commonly known as:   CHLOR-TRIMETON Take 4 mg by mouth every 6 (six) hours as needed for allergies.   cyclobenzaprine 10 MG tablet Commonly known as:  FLEXERIL Take 1/2 to 1 tablet 2 to 3 x day if needed for muscle spasm   diclofenac 1.3 % Ptch Commonly known as:  FLECTOR Place 1 patch onto the skin every morning.   ESTRACE VAGINAL 0.1 MG/GM vaginal cream Generic drug:  estradiol Place 1 Applicatorful vaginally 2 (two) times a week.   fluticasone furoate-vilanterol 200-25 MCG/INH Aepb Commonly known as:  BREO ELLIPTA Inhale 1 puff into the lungs daily.   furosemide 40 MG tablet Commonly known as:  LASIX TAKE 1 TABLET EVERY MORNING FOR BLOOD PRESSURE AND FLUID   levothyroxine 75 MCG tablet Commonly known as:  SYNTHROID, LEVOTHROID Take 1 tablet (75 mcg total) by mouth daily.   losartan 100 MG tablet Commonly known as:  COZAAR Take 1 tablet (100 mg total) by mouth daily.   metFORMIN 500 MG 24 hr tablet Commonly known as:  GLUCOPHAGE-XR Take 1 tablet by mouth 2 times daily with meals   montelukast 10 MG tablet Commonly known as:  SINGULAIR Take 1 tablet (10 mg total) by mouth daily.   NUCYNTA 50 MG tablet Generic drug:  tapentadol TAKE 1 TABLET (50 mg) four times a day as needed for severe headache   pantoprazole 40 MG tablet Commonly known as:  PROTONIX Take 1 tablet (40 mg total) by mouth daily.   potassium chloride SA 20 MEQ tablet Commonly known as:  K-DUR,KLOR-CON TAKE 1 TABLET DAILY   promethazine-dextromethorphan 6.25-15 MG/5ML syrup Commonly known as:  PROMETHAZINE-DM Take 5 mLs by mouth 4 (four) times daily as needed for cough.   ranitidine 300 MG tablet Commonly known as:  ZANTAC TAKE TWICE A DAY WHILE TRYING TO GET OFF PPI   sertraline 100 MG tablet Commonly known as:  ZOLOFT Take 1-2 tablets daily for anxiety   SUMAtriptan 100 MG tablet Commonly known as:  IMITREX Take 1 tablet (100 mg total) by mouth once as needed for migraine. May repeat in 2 hours if headache  persists or recurs.   VITAMIN B 12 PO Take 1 tablet by mouth daily.   Vitamin D3 10000 units Tabs Take 10,000 Units by mouth daily.   ZINC PO Take 1 tablet by mouth daily.       Birth History: non-contributory.   Developmental History: non-contributory.  Past Surgical  History: Past Surgical History:  Procedure Laterality Date  . ANKLE GANGLION CYST EXCISION Left   . BACK SURGERY    . birth mark treatments    . BREAST LUMPECTOMY WITH RADIOACTIVE SEED LOCALIZATION Left 02/02/2016   Procedure: LEFT BREAST LUMPECTOMY WITH RADIOACTIVE SEED LOCALIZATION;  Surgeon: Autumn Messing III, MD;  Location: [facility];  Service: General;  Laterality: Left;  . BREAST SURGERY Right    papilloma removed     Family History: Family History  Problem Relation Age of Onset  . Emphysema Mother     smoked  . Allergies Mother   . Asthma Mother   . Heart disease Mother   . Breast cancer Mother     with mets to Bone  . Prostate cancer Father   . Brain cancer Father   . Allergic rhinitis Neg Hx   . Angioedema Neg Hx   . Eczema Neg Hx   . Immunodeficiency Neg Hx   . Urticaria Neg Hx      Social History: Naoko lives at home with her husband. She lives in a home built in 1997. There is hardwood throughout the home. There is no mildew damage or roaches. They have electric heating and central cooling. There is a cat inside the home. There are no dust mite covers on the bedding. There is tobacco smoke exposure, although the patient does not smoke herself. Her husband has smoked, although her husband was vaping for six years. He started using regular cigarettes again in November 2017 which preceded her worsening symptoms. She has a humidifier in multiple places on her home, although she uses bottled water for these. She works in the [employer] here in [place].   Review of Systems: a 14-point review of systems is pertinent for what is mentioned in HPI.  Otherwise, all other  systems were negative. Constitutional: negative other than that listed in the HPI Eyes: negative other than that listed in the HPI Ears, nose, mouth, throat, and face: negative other than that listed in the HPI Respiratory: negative other than that listed in the HPI Cardiovascular: negative other than that listed in the HPI Gastrointestinal: negative other than that listed in the HPI Genitourinary: negative other than that listed in the HPI Integument: negative other than that listed in the HPI Hematologic: negative other than that listed in the HPI Musculoskeletal: negative other than that listed in the HPI Neurological: negative other than that listed in the HPI Allergy/Immunologic: negative other than that listed in the HPI    Objective:   Blood pressure 118/74, pulse 71, temperature 97.8 F (36.6 C), temperature source Oral, resp. rate 20, height 5' 2.5" (1.588 m), weight 193 lb 12.8 oz (87.9 kg), SpO2 97 %. Body mass index is 34.88 kg/m.   Physical Exam:  General: Alert, interactive, in no acute distress. Pleasant. 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  Eyes: No conjunctival injection present on the right, No conjunctival injection present on the left, PERRL bilaterally, No discharge on the right, No discharge on the left and No Horner-Trantas dots present Ears: Right TM pearly gray with normal light reflex, Left TM pearly gray with normal light reflex, Right TM intact without perforation and Left TM intact without perforation.  Nose/Throat: External nose within normal limits and nasal crease present, turbinates edematous and pale with clear discharge, post-pharynx erythematous with cobblestoning in the posterior oropharynx. Tonsils 2+ without exudates Neck: Supple without thyromegaly.  Adenopathy: no enlarged lymph  nodes appreciated in the anterior cervical, occipital, axillary, epitrochlear, inguinal, or popliteal regions Lungs: Clear to auscultation without  wheezing, rhonchi or rales. No increased work of breathing. CV: Normal S1/S2, no murmurs. Capillary refill <2 seconds.  Abdomen: Nondistended, nontender. No guarding or rebound tenderness. Bowel sounds present in all fields and hyperactive  Skin: Warm and dry, without lesions or rashes aside from the port wine stain Extremities:  No clubbing, cyanosis or edema. Neuro:   Grossly intact. No focal deficits appreciated. Responsive to questions.  Diagnostic studies:  Spirometry: results normal (FEV1: 2.09/89%, FVC: 2.62/90%, FEV1/FVC: 79%).    Spirometry consistent with normal pattern. Albuterol/Atrovent nebulizer treatment given in clinic with improvement in both the FEV1 and FVC, although this did not meet ATS criteria. The FEF 25-75% did increase 46%.  Allergy Studies:   Indoor/Outdoor Percutaneous Adult Environmental Panel: positive to red cedar, [place] cottonwood, Df mite and Dp mites. Otherwise negative with adequate controls.  Indoor/Outdoor Selected Intradermal Environmental Panel: positive to mold mix #1, mold mix #2, cat and cockroach. Otherwise negative with adequate controls.  Most Common Foods Panel (peanut, tree nut, soy, fish mix, shellfish mix, wheat, milk, egg): negative with adequate controls     Salvatore Marvel, MD [facility]

## 2016-11-04 NOTE — Patient Instructions (Addendum)
1. Mild persistent asthma, uncomplicated - Spirometry was normal but it did improve with albuterol nebulizer treatment. - Continue with Breo but use one inhalation once daily. - Daily controller medication(s): Breo 200/25 one inhalation once daily - Rescue medications: ProAir 4 puffs every 4-6 hours as needed - Asthma control goals:  * Full participation in all desired activities (may need albuterol before activity) * Albuterol use two time or less a week on average (not counting use with activity) * Cough interfering with sleep two time or less a month * Oral steroids no more than once a year * No hospitalizations  2. Chronic nonseasonal allergic - Testing was positive to cottonwood, red cedar, dust mite, molds, cockroach, and cat. - Use nasal saline rinses twice daily before using Flonase.  - Use Flonase one spray per nostril twice daily (once in the morning and once at night) - Continue with Singulair 10mg  daily to help with allergic rhinitis as well asthma.  - Continue with chlorpheniramine twice daily.  - Consider immunotherapy as a means of long-term control (information provided to call insurance company).  3. Gastroesophageal reflux disease - Continue with Zantac (ranitidine) but increase to 150mg  in the morning and 150mg  at night.  4. Return in about 2 months (around 01/04/2017).  Please inform us of any Emergency Department visits, hospitalizations, or changes in symptoms. Call us before going to the ED for breathing or allergy symptoms since we might be able to fit you in for a sick visit. Feel free to contact us anytime with any questions, problems, or concerns.  It was a pleasure to meet you today! Happy spring!   Websites that have reliable patient information: 1. American Academy of Asthma, Allergy, and Immunology: www.aaaai.org 2. Food Allergy Research and Education (FARE): foodallergy.org 3. Mothers of Asthmatics: http://www.asthmacommunitynetwork.org 4. American  College of Allergy, Asthma, and Immunology: www.acaai.org  Reducing Pollen Exposure  The American Academy of Allergy, Asthma and Immunology suggests the following steps to reduce your exposure to pollen during allergy seasons.    1. Do not hang sheets or clothing out to dry; pollen may collect on these items. 2. Do not mow lawns or spend time around freshly cut grass; mowing stirs up pollen. 3. Keep windows closed at night.  Keep car windows closed while driving. 4. Minimize morning activities outdoors, a time when pollen counts are usually at their highest. 5. Stay indoors as much as possible when pollen counts or humidity is high and on windy days when pollen tends to remain in the air longer. 6. Use air conditioning when possible.  Many air conditioners have filters that trap the pollen spores. 7. Use a HEPA room air filter to remove pollen form the indoor air you breathe.  Control of House Dust Mite Allergen    House dust mites play a major role in allergic asthma and rhinitis.  They occur in environments with high humidity wherever human skin, the food for dust mites is found. High levels have been detected in dust obtained from mattresses, pillows, carpets, upholstered furniture, bed covers, clothes and soft toys.  The principal allergen of the house dust mite is found in its feces.  A gram of dust may contain 1,000 mites and 250,000 fecal particles.  Mite antigen is easily measured in the air during house cleaning activities.    1. Encase mattresses, including the box spring, and pillow, in an air tight cover.  Seal the zipper end of the encased mattresses with wide adhesive tape. 2.  Wash the bedding in water of 130 degrees Farenheit weekly.  Avoid cotton comforters/quilts and flannel bedding: the most ideal bed covering is the dacron comforter. 3. Remove all upholstered furniture from the bedroom. 4. Remove carpets, carpet padding, rugs, and non-washable window drapes from the  bedroom.  Wash drapes weekly or use plastic window coverings. 5. Remove all non-washable stuffed toys from the bedroom.  Wash stuffed toys weekly. 6. Have the room cleaned frequently with a vacuum cleaner and a damp dust-mop.  The patient should not be in a room which is being cleaned and should wait 1 hour after cleaning before going into the room. 7. Close and seal all heating outlets in the bedroom.  Otherwise, the room will become filled with dust-laden air.  An electric heater can be used to heat the room. 8. Reduce indoor humidity to less than 50%.  Do not use a humidifier.  Control of Dog or Cat Allergen  Avoidance is the best way to manage a dog or cat allergy. If you have a dog or cat and are allergic to dog or cats, consider removing the dog or cat from the home. If you have a dog or cat but don't want to find it a new home, or if your family wants a pet even though someone in the household is allergic, here are some strategies that may help keep symptoms at bay:  1. Keep the pet out of your bedroom and restrict it to only a few rooms. Be advised that keeping the dog or cat in only one room will not limit the allergens to that room. 2. Don't pet, hug or kiss the dog or cat; if you do, wash your hands with soap and water. 3. High-efficiency particulate air (HEPA) cleaners run continuously in a bedroom or living room can reduce allergen levels over time. 4. Regular use of a high-efficiency vacuum cleaner or a central vacuum can reduce allergen levels. 5. Giving your dog or cat a bath at least once a week can reduce airborne allergen.  Control of Mold Allergen  Mold and fungi can grow on a variety of surfaces provided certain temperature and moisture conditions exist.  Outdoor molds grow on plants, decaying vegetation and soil.  The major outdoor mold, Alternaria and Cladosporium, are found in very high numbers during hot and dry conditions.  Generally, a late Summer - Fall peak is seen for  common outdoor fungal spores.  Rain will temporarily lower outdoor mold spore count, but counts rise rapidly when the rainy period ends.  The most important indoor molds are Aspergillus and Penicillium.  Dark, humid and poorly ventilated basements are ideal sites for mold growth.  The next most common sites of mold growth are the bathroom and the kitchen.  Outdoor [employer] 1. Use air conditioning and keep windows closed 2. Avoid exposure to decaying vegetation. 3. Avoid leaf raking. 4. Avoid grain handling. 5. Consider wearing a face mask if working in moldy areas.  Indoor Mold Control 1. Maintain humidity below 50%. 2. Clean washable surfaces with 5% bleach solution. 3. Remove sources e.g. contaminated carpets.

## 2016-11-11 DIAGNOSIS — Q825 Congenital non-neoplastic nevus: Secondary | ICD-10-CM | POA: Insufficient documentation

## 2016-11-11 DIAGNOSIS — D485 Neoplasm of uncertain behavior of skin: Secondary | ICD-10-CM | POA: Diagnosis not present

## 2016-12-11 DIAGNOSIS — H25811 Combined forms of age-related cataract, right eye: Secondary | ICD-10-CM | POA: Diagnosis not present

## 2016-12-11 DIAGNOSIS — H2511 Age-related nuclear cataract, right eye: Secondary | ICD-10-CM | POA: Diagnosis not present

## 2016-12-11 DIAGNOSIS — H25041 Posterior subcapsular polar age-related cataract, right eye: Secondary | ICD-10-CM | POA: Diagnosis not present

## 2016-12-18 NOTE — Progress Notes (Signed)
Assessment and Plan:   Essential hypertension - CBC with Differential/Platelet - Hepatic function panel - BASIC METABOLIC PANEL WITH GFR - TSH  Type 2 diabetes mellitus without complication, without long-term current use of insulin (HCC) - Hemoglobin A1c Discussed general issues about diabetes pathophysiology and management., Educational material distributed., Suggested low cholesterol diet., Encouraged aerobic exercise., Discussed foot care., Reminded to get yearly retinal exam.  CKD (chronic kidney disease) stage 2, GFR 60-89 ml/min - BASIC METABOLIC PANEL WITH GFR  Hyperlipemia - Lipid panel   Morbid obesity, unspecified obesity type (HCC) Obesity with co morbidities- long discussion about weight loss, diet, and exercise Add phentermine   Medication management - Magnesium  Vitamin D deficiency - VITAMIN D 25 Hydroxy (Vit-D Deficiency, Fractures)   Continue diet and meds as discussed. Further disposition pending results of labs. Future Appointments Date Time Provider [place]  01/09/2017 5:00 PM Valentina Shaggy, MD [facility] None  03/24/2017 4:30 PM Vicie Mutters, PA-C [facility] None  09/16/2017 3:00 PM Vicie Mutters, PA-C [facility] None    HPI 61 y.o. female  presents for 3 month follow up with hypertension, hyperlipidemia, prediabetes and vitamin D.  Her blood pressure has been controlled at home, today their BP is BP: 124/80  She does workout, walks and wears her fitbit, tries to walk 2 miles a day.  She denies chest pain, shortness of breath, dizziness.   She is not on cholesterol medication and denies myalgias. Her cholesterol is not at goal. The cholesterol last visit was:   Lab Results  Component Value Date   CHOL 207 (H) 09/10/2016   HDL 52 09/10/2016   LDLCALC 116 (H) 09/10/2016   TRIG 193 (H) 09/10/2016   CHOLHDL 4.0 09/10/2016    She has been working on diet and exercise for prediabetes, and denies paresthesia of the feet, polydipsia,  polyuria and visual disturbances. Last A1C in the office was:  Lab Results  Component Value Date   HGBA1C 5.8 (H) 09/10/2016  Patient is on Vitamin D supplement.   Lab Results  Component Value Date   VD25OH 27 (L) 09/10/2016    BMI is Body mass index is 34.88 kg/m., she is working on diet and exercise. She has been following with Dr. Brett Fairy for OSA/migraines.   Wt Readings from Last 3 Encounters:  12/19/16 193 lb 12.8 oz (87.9 kg)  11/04/16 193 lb 12.8 oz (87.9 kg)  09/27/16 198 lb (89.8 kg)   She is on thyroid medication. Her medication was not changed last visit.   Lab Results  Component Value Date   TSH 3.27 09/10/2016  She has history of lumbar back surgery over 17 years ago with fusion L4-L5, has had injection with Dr. Vira Blanco.    Current Medications:  Current Outpatient Prescriptions on File Prior to Visit  Medication Sig Dispense Refill  . albuterol (PROAIR HFA) 108 (90 Base) MCG/ACT inhaler Inhale 2 puffs into the lungs every 6 (six) hours as needed for wheezing. 3 Inhaler 3  . amitriptyline (ELAVIL) 10 MG tablet TAKE 1 TO 2 TABLETS BY MOUTH DAILY AT BEDTIME (Patient taking differently: TAKE 0.5 TO 1 TABLET BY MOUTH DAILY AT BEDTIME as needed for sleep) 180 tablet 1  . chlorpheniramine (CHLOR-TRIMETON) 4 MG tablet Take 4 mg by mouth every 6 (six) hours as needed for allergies.    . Cholecalciferol (VITAMIN D3) 10000 units TABS Take 10,000 Units by mouth daily.    . Cyanocobalamin (VITAMIN B 12 PO) Take 1 tablet by mouth daily.    Marland Kitchen  cyclobenzaprine (FLEXERIL) 10 MG tablet Take 1/2 to 1 tablet 2 to 3 x day if needed for muscle spasm (Patient taking differently: Take 20 mg by mouth at bedtime as needed for muscle spasms. ) 90 tablet 1  . diclofenac (FLECTOR) 1.3 % PTCH Place 1 patch onto the skin every morning. (Patient taking differently: Place 1 patch onto the skin daily as needed (for back pain). ) 30 patch 3  . ESTRACE VAGINAL 0.1 MG/GM vaginal cream Place 1 Applicatorful  vaginally 2 (two) times a week. 127.5 g 1  . fluticasone furoate-vilanterol (BREO ELLIPTA) 200-25 MCG/INH AEPB Inhale 1 puff into the lungs daily. 60 each 3  . furosemide (LASIX) 40 MG tablet TAKE 1 TABLET EVERY MORNING FOR BLOOD PRESSURE AND FLUID 90 tablet 1  . levothyroxine (SYNTHROID, LEVOTHROID) 75 MCG tablet Take 1 tablet (75 mcg total) by mouth daily. 90 tablet 3  . losartan (COZAAR) 100 MG tablet Take 1 tablet (100 mg total) by mouth daily. 90 tablet 1  . metFORMIN (GLUCOPHAGE-XR) 500 MG 24 hr tablet Take 1 tablet by mouth 2 times daily with meals 180 tablet 1  . montelukast (SINGULAIR) 10 MG tablet Take 1 tablet (10 mg total) by mouth daily. 90 tablet 3  . Multiple Vitamins-Minerals (ZINC PO) Take 1 tablet by mouth daily.    . NUCYNTA 50 MG TABS tablet TAKE 1 TABLET (50 mg) four times a day as needed for severe headache  0  . pantoprazole (PROTONIX) 40 MG tablet Take 1 tablet (40 mg total) by mouth daily. 90 tablet 1  . potassium chloride SA (K-DUR,KLOR-CON) 20 MEQ tablet TAKE 1 TABLET DAILY (Patient taking differently: TAKE 1 TABLET DAILY as needed for low potassium) 90 tablet 1  . ranitidine (ZANTAC) 300 MG tablet TAKE TWICE A DAY WHILE TRYING TO GET OFF PPI 180 tablet 3  . sertraline (ZOLOFT) 100 MG tablet Take 1-2 tablets daily for anxiety 180 tablet 3  . SUMAtriptan (IMITREX) 100 MG tablet Take 1 tablet (100 mg total) by mouth once as needed for migraine. May repeat in 2 hours if headache persists or recurs. 10 tablet 2  . benzonatate (TESSALON) 100 MG capsule      No current facility-administered medications on file prior to visit.    Medical History:  Past Medical History:  Diagnosis Date  . Anemia   . Anxiety   . Arthritis    back  . Asthma   . Depression   . Deviated septum   . Diabetes ([place])   . GERD (gastroesophageal reflux disease)   . Headache   . History of hiatal hernia   . Hyperlipemia   . Hypertension   . OSA (obstructive sleep apnea)    Allergies:   Allergies  Allergen Reactions  . Doxycycline Other (See Comments)    Unknown  . Levaquin [Levofloxacin In D5w] Other (See Comments)    Ankles hurt really bad  . Prednisone Swelling    Weight gain    Review of Systems:  Review of Systems  Constitutional: Positive for malaise/fatigue. Negative for chills, diaphoresis, fever and weight loss.  HENT: Negative for congestion, ear discharge, ear pain, hearing loss, nosebleeds, sore throat and tinnitus.   Eyes: Negative.   Respiratory: Negative.  Negative for stridor.   Cardiovascular: Negative.   Gastrointestinal: Positive for constipation. Negative for abdominal pain, blood in stool, diarrhea, heartburn, melena, nausea and vomiting.  Genitourinary: Negative.   Musculoskeletal: Positive for back pain. Negative for falls, joint pain, myalgias and  neck pain.  Skin: Negative.   Neurological: Positive for tingling (right leg) and headaches. Negative for dizziness, tremors, sensory change, speech change, focal weakness, seizures, loss of consciousness and weakness.  Psychiatric/Behavioral: Positive for depression and memory loss. Negative for hallucinations, substance abuse and suicidal ideas. The patient has insomnia. The patient is not nervous/anxious.     Family history- Review and unchanged Social history- Review and unchanged Physical Exam: BP 124/80   Pulse 67   Temp 97.5 F (36.4 C)   Resp 16   Ht 5' 2.5" (1.588 m)   Wt 193 lb 12.8 oz (87.9 kg)   SpO2 97%   BMI 34.88 kg/m  Wt Readings from Last 3 Encounters:  12/19/16 193 lb 12.8 oz (87.9 kg)  11/04/16 193 lb 12.8 oz (87.9 kg)  09/27/16 198 lb (89.8 kg)   General Appearance: Well nourished, in no apparent distress. Eyes: PERRLA, EOMs, conjunctiva no swelling or erythema Sinuses: No Frontal/maxillary tenderness ENT/Mouth: Ext aud canals clear, TMs without erythema, bulging. No erythema, swelling, or exudate on post pharynx.  Tonsils not swollen or erythematous. Hearing  normal.  Neck: Supple, thyroid normal.  Respiratory: Respiratory effort normal, BS equal bilaterally without rales, rhonchi, wheezing or stridor.  Cardio: RRR with no MRGs. Brisk peripheral pulses without edema.  Abdomen: Soft, + BS,  mild epigastric tenderness, no guarding, rebound, hernias, masses. Lymphatics: Non tender without lymphadenopathy.  Musculoskeletal: Full ROM, 5/5 strength, Normal gait,  Skin: Port wine stain right face. Warm, dry without rashes, lesions, ecchymosis.  Neuro: Cranial nerves intact. Normal muscle tone, no cerebellar symptoms. Psych: Awake and oriented X 3, normal affect, Insight and Judgment appropriate.    Vicie Mutters, PA-C 4:58 PM [facility]

## 2016-12-19 ENCOUNTER — Ambulatory Visit (INDEPENDENT_AMBULATORY_CARE_PROVIDER_SITE_OTHER): Payer: 59 | Admitting: Physician Assistant

## 2016-12-19 ENCOUNTER — Encounter: Payer: Self-pay | Admitting: Physician Assistant

## 2016-12-19 VITALS — BP 124/80 | HR 67 | Temp 97.5°F | Resp 16 | Ht 62.5 in | Wt 193.8 lb

## 2016-12-19 DIAGNOSIS — Q858 Other phakomatoses, not elsewhere classified: Secondary | ICD-10-CM | POA: Diagnosis not present

## 2016-12-19 DIAGNOSIS — I10 Essential (primary) hypertension: Secondary | ICD-10-CM

## 2016-12-19 DIAGNOSIS — D649 Anemia, unspecified: Secondary | ICD-10-CM | POA: Diagnosis not present

## 2016-12-19 DIAGNOSIS — E119 Type 2 diabetes mellitus without complications: Secondary | ICD-10-CM | POA: Diagnosis not present

## 2016-12-19 DIAGNOSIS — N182 Chronic kidney disease, stage 2 (mild): Secondary | ICD-10-CM

## 2016-12-19 DIAGNOSIS — Q8589 Other phakomatoses, not elsewhere classified: Secondary | ICD-10-CM

## 2016-12-19 DIAGNOSIS — Z79899 Other long term (current) drug therapy: Secondary | ICD-10-CM

## 2016-12-19 DIAGNOSIS — E785 Hyperlipidemia, unspecified: Secondary | ICD-10-CM

## 2016-12-19 LAB — CBC WITH DIFFERENTIAL/PLATELET
Basophils Absolute: 0 cells/uL (ref 0–200)
Basophils Relative: 0 %
Eosinophils Absolute: 270 cells/uL (ref 15–500)
Eosinophils Relative: 3 %
HCT: 37.7 % (ref 35.0–45.0)
Hemoglobin: 12 g/dL (ref 11.7–15.5)
Lymphocytes Relative: 35 %
Lymphs Abs: 3150 cells/uL (ref 850–3900)
MCH: 26.1 pg — ABNORMAL LOW (ref 27.0–33.0)
MCHC: 31.8 g/dL — ABNORMAL LOW (ref 32.0–36.0)
MCV: 82 fL (ref 80.0–100.0)
MPV: 9.5 fL (ref 7.5–12.5)
Monocytes Absolute: 630 cells/uL (ref 200–950)
Monocytes Relative: 7 %
Neutro Abs: 4950 cells/uL (ref 1500–7800)
Neutrophils Relative %: 55 %
Platelets: 273 10*3/uL (ref 140–400)
RBC: 4.6 MIL/uL (ref 3.80–5.10)
RDW: 15.6 % — ABNORMAL HIGH (ref 11.0–15.0)
WBC: 9 10*3/uL (ref 3.8–10.8)

## 2016-12-19 LAB — HEPATIC FUNCTION PANEL
ALT: 17 U/L (ref 6–29)
AST: 16 U/L (ref 10–35)
Albumin: 3.9 g/dL (ref 3.6–5.1)
Alkaline Phosphatase: 69 U/L (ref 33–130)
Bilirubin, Direct: 0 mg/dL (ref <=0.2)
Indirect Bilirubin: 0.3 mg/dL (ref 0.2–1.2)
Total Bilirubin: 0.3 mg/dL (ref 0.2–1.2)
Total Protein: 6.6 g/dL (ref 6.1–8.1)

## 2016-12-19 LAB — BASIC METABOLIC PANEL WITH GFR
BUN: 21 mg/dL (ref 7–25)
CO2: 24 mmol/L (ref 20–31)
Calcium: 9.2 mg/dL (ref 8.6–10.4)
Chloride: 103 mmol/L (ref 98–110)
Creat: 0.92 mg/dL (ref 0.50–1.05)
GFR, Est African American: 79 mL/min (ref >=60)
GFR, Est Non African American: 68 mL/min (ref >=60)
Glucose, Bld: 81 mg/dL (ref 65–99)
Potassium: 4.5 mmol/L (ref 3.5–5.3)
Sodium: 138 mmol/L (ref 135–146)

## 2016-12-19 LAB — LIPID PANEL
Cholesterol: 193 mg/dL (ref <200)
HDL: 53 mg/dL (ref >50)
LDL Cholesterol: 109 mg/dL — ABNORMAL HIGH (ref <100)
Total CHOL/HDL Ratio: 3.6 Ratio (ref <5.0)
Triglycerides: 153 mg/dL — ABNORMAL HIGH (ref <150)
VLDL: 31 mg/dL — ABNORMAL HIGH (ref <30)

## 2016-12-19 NOTE — Patient Instructions (Signed)
Simple math prevails.    1st - exercise does not produce significant weight loss - at best one converts fat into muscle , "bulks up", loses inches, but usually stays "weight neutral"     2nd - think of your body weightas a check book: If you eat more calories than you burn up - you save money or gain weight .... Or if you spend more money than you put in the check book, ie burn up more calories than you eat, then you lose weight     3rd - if you walk or run 1 mile, you burn up 100 calories - you have to burn up 3,500 calories to lose 1 pound, ie you have to walk/run 35 miles to lose 1 measly pound. So if you want to lose 10 #, then you have to walk/run 350 miles, so.... clearly exercise is not the solution.     4. So if you consume 1,500 calories, then you have to burn up the equivalent of 15 miles to stay weight neutral - It also stands to reason that if you consume 1,500 cal/day and don't lose weight, then you must be burning up about 1,500 cals/day to stay weight neutral.     5. If you really want to lose weight, you must cut your calorie intake 300 calories /day and at that rate you should lose about 1 # every 3 days.   6. Please purchase Dr Joel Fuhrman's book(s) "The End of Dieting" & "Eat to Live" . It has some great concepts and recipes.      Before you even begin to attack a weight-loss plan, it pays to remember this: You are not fat. You have fat. Losing weight isn't about blame or shame; it's simply another achievement to accomplish. Dieting is like any other skill-you have to buckle down and work at it. As long as you act in a smart, reasonable way, you'll ultimately get where you want to be. Here are some weight loss pearls for you.  1. It's Not a Diet. It's a Lifestyle Thinking of a diet as something you're on and suffering through only for the short term doesn't work. To shed weight and keep it off, you need to make permanent changes to the way you eat. It's OK to indulge  occasionally, of course, but if you cut calories temporarily and then revert to your old way of eating, you'll gain back the weight quicker than you can say yo-yo. Use it to lose it. Research shows that one of the best predictors of long-term weight loss is how many pounds you drop in the first month. For that reason, nutritionists often suggest being stricter for the first two weeks of your new eating strategy to build momentum. Cut out added sugar and alcohol and avoid unrefined carbs. After that, figure out how you can reincorporate them in a way that's healthy and maintainable.  2. There's a Right Way to Exercise Working out burns calories and fat and boosts your metabolism by building muscle. But those trying to lose weight are notorious for overestimating the number of calories they burn and underestimating the amount they take in. Unfortunately, your system is biologically programmed to hold on to extra pounds and that means when you start exercising, your body senses the deficit and ramps up its hunger signals. If you're not diligent, you'll eat everything you burn and then some. Use it to lose it. Cardio gets all the exercise glory, but strength and interval   training are the real heroes. They help you build lean muscle, which in turn increases your metabolism and calorie-burning ability 3. Don't Overreact to Mild Hunger Some people have a hard time losing weight because of hunger anxiety. To them, being hungry is bad-something to be avoided at all costs-so they carry snacks with them and eat when they don't need to. Others eat because they're stressed out or bored. While you never want to get to the point of being ravenous (that's when bingeing is likely to happen), a hunger pang, a craving, or the fact that it's 3:00 p.m. should not send you racing for the vending machine or obsessing about the energy bar in your purse. Ideally, you should put off eating until your stomach is growling and it's  difficult to concentrate.  Use it to lose it. When you feel the urge to eat, use the HALT method. Ask yourself, Am I really hungry? Or am I angry or anxious, lonely or bored, or tired? If you're still not certain, try the apple test. If you're truly hungry, an apple should seem delicious; if it doesn't, something else is going on. Or you can try drinking water and making yourself busy, if you are still hungry try a healthy snack.  4. Not All Calories Are Created Equal The mechanics of weight loss are pretty simple: Take in fewer calories than you use for energy. But the kind of food you eat makes all the difference. Processed food that's high in saturated fat and refined starch or sugar can cause inflammation that disrupts the hormone signals that tell your brain you're full. The result: You eat a lot more.  Use it to lose it. Clean up your diet. Swap in whole, unprocessed foods, including vegetables, lean protein, and healthy fats that will fill you up and give you the biggest nutritional bang for your calorie buck. In a few weeks, as your brain starts receiving regular hunger and fullness signals once again, you'll notice that you feel less hungry overall and naturally start cutting back on the amount you eat.  5. Protein, Produce, and Plant-Based Fats Are Your Weight-Loss Trinity Here's why eating the three Ps regularly will help you drop pounds. Protein fills you up. You need it to build lean muscle, which keeps your metabolism humming so that you can torch more fat. People in a weight-loss program who ate double the recommended daily allowance for protein (about 110 grams for a 150-pound woman) lost 70 percent of their weight from fat, while people who ate the RDA lost only about 40 percent, one study found. Produce is packed with filling fiber. "It's very difficult to consume too many calories if you're eating a lot of vegetables. Example: Three cups of broccoli is a lot of food, yet only 93 calories.  (Fruit is another story. It can be easy to overeat and can contain a lot of calories from sugar, so be sure to monitor your intake.) Plant-based fats like olive oil and those in avocados and nuts are healthy and extra satiating.  Use it to lose it. Aim to incorporate each of the three Ps into every meal and snack. People who eat protein throughout the day are able to keep weight off, according to a study in the American Journal of Clinical Nutrition. In addition to meat, poultry and seafood, good sources are beans, lentils, eggs, tofu, and yogurt. As for fat, keep portion sizes in check by measuring out salad dressing, oil, and nut butters (shoot   for one to two tablespoons). Finally, eat veggies or a little fruit at every meal. People who did that consumed 308 fewer calories but didn't feel any hungrier than when they didn't eat more produce.  7. How You Eat Is As Important As What You Eat In order for your brain to register that you're full, you need to focus on what you're eating. Sit down whenever you eat, preferably at a table. Turn off the TV or computer, put down your phone, and look at your food. Smell it. Chew slowly, and don't put another bite on your fork until you swallow. When women ate lunch this attentively, they consumed 30 percent less when snacking later than those who listened to an audiobook at lunchtime, according to a study in the [employer] Journal of Nutrition. 8. Weighing Yourself Really Works The scale provides the best evidence about whether your efforts are paying off. Seeing the numbers tick up or down or stagnate is motivation to keep going-or to rethink your approach. A 2015 study at [employer] found that daily weigh-ins helped people lose more weight, keep it off, and maintain that loss, even after two years. Use it to lose it. Step on the scale at the same time every day for the best results. If your weight shoots up several pounds from one weigh-in to the next, don't  freak out. Eating a lot of salt the night before or having your period is the likely culprit. The number should return to normal in a day or two. It's a steady climb that you need to do something about. 9. Too Much Stress and Too Little Sleep Are Your Enemies When you're tired and frazzled, your body cranks up the production of cortisol, the stress hormone that can cause carb cravings. Not getting enough sleep also boosts your levels of ghrelin, a hormone associated with hunger, while suppressing leptin, a hormone that signals fullness and satiety. People on a diet who slept only five and a half hours a night for two weeks lost 55 percent less fat and were hungrier than those who slept eight and a half hours, according to a study in the [employer] Journal. Use it to lose it. Prioritize sleep, aiming for seven hours or more a night, which research shows helps lower stress. And make sure you're getting quality zzz's. If a snoring spouse or a fidgety cat wakes you up frequently throughout the night, you may end up getting the equivalent of just four hours of sleep, according to a study from [employer]. Keep pets out of the bedroom, and use a white-noise app to drown out snoring. 10. You Will Hit a plateau-And You Can Bust Through It As you slim down, your body releases much less leptin, the fullness hormone.  If you're not strength training, start right now. Building muscle can raise your metabolism to help you overcome a plateau. To keep your body challenged and burning calories, incorporate new moves and more intense intervals into your workouts or add another sweat session to your weekly routine. Alternatively, cut an extra 100 calories or so a day from your diet. Now that you've lost weight, your body simply doesn't need as much fuel.   

## 2016-12-20 LAB — TSH: TSH: 2.87 mIU/L

## 2016-12-20 LAB — HEMOGLOBIN A1C
Hgb A1c MFr Bld: 5.4 % (ref <5.7)
Mean Plasma Glucose: 108 mg/dL

## 2016-12-20 LAB — MAGNESIUM: Magnesium: 2 mg/dL (ref 1.5–2.5)

## 2016-12-24 HISTORY — PX: CATARACT EXTRACTION: SUR2

## 2017-01-02 DIAGNOSIS — Z803 Family history of malignant neoplasm of breast: Secondary | ICD-10-CM | POA: Diagnosis not present

## 2017-01-09 ENCOUNTER — Ambulatory Visit: Payer: 59 | Admitting: Allergy & Immunology

## 2017-01-14 ENCOUNTER — Emergency Department (HOSPITAL_COMMUNITY): Payer: 59

## 2017-01-14 ENCOUNTER — Encounter (HOSPITAL_COMMUNITY): Payer: Self-pay | Admitting: Emergency Medicine

## 2017-01-14 ENCOUNTER — Emergency Department (HOSPITAL_COMMUNITY)
Admission: EM | Admit: 2017-01-14 | Discharge: 2017-01-14 | Disposition: A | Discharge status: Home / Self Care | Payer: 59 | Attending: Emergency Medicine | Admitting: Emergency Medicine

## 2017-01-14 DIAGNOSIS — Z7722 Contact with and (suspected) exposure to environmental tobacco smoke (acute) (chronic): Secondary | ICD-10-CM | POA: Insufficient documentation

## 2017-01-14 DIAGNOSIS — R1010 Upper abdominal pain, unspecified: Secondary | ICD-10-CM | POA: Diagnosis not present

## 2017-01-14 DIAGNOSIS — J45909 Unspecified asthma, uncomplicated: Secondary | ICD-10-CM | POA: Insufficient documentation

## 2017-01-14 DIAGNOSIS — E1122 Type 2 diabetes mellitus with diabetic chronic kidney disease: Secondary | ICD-10-CM | POA: Insufficient documentation

## 2017-01-14 DIAGNOSIS — R1013 Epigastric pain: Secondary | ICD-10-CM

## 2017-01-14 DIAGNOSIS — Z79899 Other long term (current) drug therapy: Secondary | ICD-10-CM | POA: Insufficient documentation

## 2017-01-14 DIAGNOSIS — Z7984 Long term (current) use of oral hypoglycemic drugs: Secondary | ICD-10-CM | POA: Diagnosis not present

## 2017-01-14 DIAGNOSIS — I129 Hypertensive chronic kidney disease with stage 1 through stage 4 chronic kidney disease, or unspecified chronic kidney disease: Secondary | ICD-10-CM | POA: Diagnosis not present

## 2017-01-14 DIAGNOSIS — N182 Chronic kidney disease, stage 2 (mild): Secondary | ICD-10-CM | POA: Insufficient documentation

## 2017-01-14 DIAGNOSIS — K8020 Calculus of gallbladder without cholecystitis without obstruction: Secondary | ICD-10-CM | POA: Diagnosis not present

## 2017-01-14 LAB — CBC
HCT: 42 % (ref 36.0–46.0)
Hemoglobin: 13.1 g/dL (ref 12.0–15.0)
MCH: 26.3 pg (ref 26.0–34.0)
MCHC: 31.2 g/dL (ref 30.0–36.0)
MCV: 84.3 fL (ref 78.0–100.0)
Platelets: 253 10*3/uL (ref 150–400)
RBC: 4.98 MIL/uL (ref 3.87–5.11)
RDW: 15.1 % (ref 11.5–15.5)
WBC: 11.8 10*3/uL — ABNORMAL HIGH (ref 4.0–10.5)

## 2017-01-14 LAB — URINALYSIS, ROUTINE W REFLEX MICROSCOPIC
Bilirubin Urine: NEGATIVE
Glucose, UA: NEGATIVE mg/dL
Hgb urine dipstick: NEGATIVE
Ketones, ur: NEGATIVE mg/dL
Leukocytes, UA: NEGATIVE
Nitrite: NEGATIVE
Protein, ur: NEGATIVE mg/dL
Specific Gravity, Urine: 1.043 — ABNORMAL HIGH (ref 1.005–1.030)
pH: 6 (ref 5.0–8.0)

## 2017-01-14 LAB — COMPREHENSIVE METABOLIC PANEL
ALT: 20 U/L (ref 14–54)
AST: 23 U/L (ref 15–41)
Albumin: 4.1 g/dL (ref 3.5–5.0)
Alkaline Phosphatase: 74 U/L (ref 38–126)
Anion gap: 10 (ref 5–15)
BUN: 24 mg/dL — ABNORMAL HIGH (ref 6–20)
CO2: 27 mmol/L (ref 22–32)
Calcium: 9.2 mg/dL (ref 8.9–10.3)
Chloride: 102 mmol/L (ref 101–111)
Creatinine, Ser: 1.04 mg/dL — ABNORMAL HIGH (ref 0.44–1.00)
GFR calc Af Amer: >60 mL/min (ref >60)
GFR calc non Af Amer: 58 mL/min — ABNORMAL LOW (ref >60)
Glucose, Bld: 116 mg/dL — ABNORMAL HIGH (ref 65–99)
Potassium: 3.8 mmol/L (ref 3.5–5.1)
Sodium: 139 mmol/L (ref 135–145)
Total Bilirubin: 0.6 mg/dL (ref 0.3–1.2)
Total Protein: 7.1 g/dL (ref 6.5–8.1)

## 2017-01-14 LAB — LIPASE, BLOOD: Lipase: 21 U/L (ref 11–51)

## 2017-01-14 LAB — I-STAT CG4 LACTIC ACID, ED
Lactic Acid, Venous: 1.25 mmol/L (ref 0.5–1.9)
Lactic Acid, Venous: 1.15 mmol/L (ref 0.5–1.9)

## 2017-01-14 LAB — I-STAT TROPONIN, ED: Troponin i, poc: 0 ng/mL (ref 0.00–0.08)

## 2017-01-14 MED ORDER — HYDROCODONE-ACETAMINOPHEN 5-325 MG PO TABS: 2.0000 | ORAL_TABLET | ORAL | 0 refills | Status: DC | PRN

## 2017-01-14 MED ORDER — IOPAMIDOL (ISOVUE-300) INJECTION 61%
INTRAVENOUS | Status: AC
  Administered 2017-01-14: 100 mL
  Filled 2017-01-14: qty 100

## 2017-01-14 MED ORDER — SODIUM CHLORIDE 0.9 % IV BOLUS (SEPSIS)
1000.0000 mL | Freq: Once | INTRAVENOUS | Status: AC
  Administered 2017-01-14: 1000 mL via INTRAVENOUS

## 2017-01-14 NOTE — ED Notes (Signed)
Patient transported to CT 

## 2017-01-14 NOTE — Discharge Instructions (Addendum)
Follow-up with her primary care doctor as needed.  Follow-up with referred outpatient surgery doctors for further evaluation of gallbladder removal.  Take the pain medication as needed for pain.  Follow the instructions listed on the chart papers for diet recommendation for gallstones.  Return the emergency department for any worsening pain, nausea/vomiting, chest pain, difficulty breathing or any other worsening or concerning symptoms.

## 2017-01-14 NOTE — ED Notes (Signed)
Pt departed in NAD, refused use of wheelchair.  

## 2017-01-14 NOTE — ED Provider Notes (Signed)
[facility] DEPT Provider Note   CSN: [id number] Arrival date & time: 01/14/17  1247     History   Chief Complaint Chief Complaint  Patient presents with  . Abdominal Pain    HPI Kalleigh ADILEE LEMME is a 61 y.o. female who presents with epigastric abdominal pain that began at 12 PM this afternoon. Patient states that she was sitting at work when she started having intense epigastric abdominal pain. Patient states that the pain was so severe that it caused her to feel like she was going to pass out. Patient described as a "severe ache" and states that did not radiate. She states that she became very diaphoretic and nauseous with the pain. She reports that she had several episodes of dry heaving but denies any episodes of vomiting. On ED arrival patient states that the abdominal pain has improved significantly. She states that now it is a 3/10. She did not take any medications prior to ED arrival. Patient reports a history of constipation and states that she takes daily stool softeners. Her last bowel movement was yesterday and was normal and did not have any blood or black tarry stools. Patient has a history of small intestine surgery 5 years ago but none since. She has never had a small bowel obstruction. Patient's last colonoscopy was 4 years ago where they found precancerous polyps. Due to her family history patient has to repeat colonoscopy every 5 years she is next due for one next year. She denies any recent illness, fever, difficult to breathing, chest pain, vomiting, dysuria, hematuria.     The history is provided by the patient.    Past Medical History:  Diagnosis Date  . Anemia   . Anxiety   . Arthritis    back  . Asthma   . Depression   . Deviated septum   . Diabetes ([place])   . GERD (gastroesophageal reflux disease)   . Headache   . History of hiatal hernia   . Hyperlipemia   . Hypertension   . OSA (obstructive sleep apnea)     Patient Active Problem List   Diagnosis  Date Noted  . Lower back pain 07/17/2015  . 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 Obesity, morbid ([place]) 03/29/2015  . CKD (chronic kidney disease) stage 2, GFR 60-89 ml/min 04/15/2014  . Hyperlipemia   . Diabetes ([place])   . Anemia   . Asthma   . Depression   . OSA (obstructive sleep apnea)   . Hypertension   . GERD (gastroesophageal reflux disease)   . Cough 12/28/2012    Past Surgical History:  Procedure Laterality Date  . ANKLE GANGLION CYST EXCISION Left   . BACK SURGERY    . birth mark treatments    . BREAST LUMPECTOMY WITH RADIOACTIVE SEED LOCALIZATION Left 02/02/2016   Procedure: LEFT BREAST LUMPECTOMY WITH RADIOACTIVE SEED LOCALIZATION;  Surgeon: Autumn Messing III, MD;  Location: [facility];  Service: General;  Laterality: Left;  . BREAST SURGERY Right    papilloma removed    OB History    No data available       Home Medications    Prior to Admission medications   Medication Sig Start Date End Date Taking? Authorizing Provider  albuterol (PROAIR HFA) 108 (90 Base) MCG/ACT inhaler Inhale 2 puffs into the lungs every 6 (six) hours as needed for wheezing. 09/10/16   Vicie Mutters, PA-C  amitriptyline (ELAVIL) 10 MG tablet TAKE 1 TO 2 TABLETS BY MOUTH DAILY AT BEDTIME Patient  taking differently: TAKE 0.5 TO 1 TABLET BY MOUTH DAILY AT BEDTIME as needed for sleep 11/16/15   Unk Pinto, MD  benzonatate (TESSALON) 100 MG capsule  09/04/16   [provider]  chlorpheniramine (CHLOR-TRIMETON) 4 MG tablet Take 4 mg by mouth every 6 (six) hours as needed for allergies.    [provider]  Cholecalciferol (VITAMIN D3) 10000 units TABS Take 10,000 Units by mouth daily.    [provider]  Cyanocobalamin (VITAMIN B 12 PO) Take 1 tablet by mouth daily.    [provider]  cyclobenzaprine (FLEXERIL) 10 MG tablet Take 1/2 to 1 tablet 2 to 3 x day if needed for muscle spasm Patient taking differently: Take 20 mg by mouth at bedtime as needed for  muscle spasms.  07/30/15   Unk Pinto, MD  diclofenac (FLECTOR) 1.3 % PTCH Place 1 patch onto the skin every morning. Patient taking differently: Place 1 patch onto the skin daily as needed (for back pain).  07/17/15   Vicie Mutters, PA-C  ESTRACE VAGINAL 0.1 MG/GM vaginal cream Place 1 Applicatorful vaginally 2 (two) times a week. 08/29/16   Unk Pinto, MD  fluticasone furoate-vilanterol (BREO ELLIPTA) 200-25 MCG/INH AEPB Inhale 1 puff into the lungs daily. 11/04/16   Valentina Shaggy, MD  furosemide (LASIX) 40 MG tablet TAKE 1 TABLET EVERY MORNING FOR BLOOD PRESSURE AND FLUID 08/29/16   Unk Pinto, MD  HYDROcodone-acetaminophen (NORCO/VICODIN) 5-325 MG tablet Take 2 tablets by mouth every 4 (four) hours as needed. 01/14/17   Volanda Napoleon, PA-C  levothyroxine (SYNTHROID, LEVOTHROID) 75 MCG tablet Take 1 tablet (75 mcg total) by mouth daily. 09/03/16   Unk Pinto, MD  losartan (COZAAR) 100 MG tablet Take 1 tablet (100 mg total) by mouth daily. 08/29/16   Unk Pinto, MD  metFORMIN (GLUCOPHAGE-XR) 500 MG 24 hr tablet Take 1 tablet by mouth 2 times daily with meals 09/13/16   Vicie Mutters, PA-C  montelukast (SINGULAIR) 10 MG tablet Take 1 tablet (10 mg total) by mouth daily. 09/03/16   Unk Pinto, MD  Multiple Vitamins-Minerals (ZINC PO) Take 1 tablet by mouth daily.    [provider]  NUCYNTA 50 MG TABS tablet TAKE 1 TABLET (50 mg) four times a day as needed for severe headache 02/10/15   [provider]  pantoprazole (PROTONIX) 40 MG tablet Take 1 tablet (40 mg total) by mouth daily. 08/29/16   Unk Pinto, MD  potassium chloride SA (K-DUR,KLOR-CON) 20 MEQ tablet TAKE 1 TABLET DAILY Patient taking differently: TAKE 1 TABLET DAILY as needed for low potassium 06/23/14   Unk Pinto, MD  ranitidine (ZANTAC) 300 MG tablet TAKE TWICE A DAY WHILE TRYING TO GET OFF PPI 09/03/16   Unk Pinto, MD  sertraline (ZOLOFT) 100 MG tablet Take 1-2  tablets daily for anxiety 08/29/16   Vicie Mutters, PA-C  SUMAtriptan (IMITREX) 100 MG tablet Take 1 tablet (100 mg total) by mouth once as needed for migraine. May repeat in 2 hours if headache persists or recurs. 07/05/15   Dohmeier, Asencion Partridge, MD    Family History Family History  Problem Relation Age of Onset  . Emphysema Mother        smoked  . Allergies Mother   . Asthma Mother   . Heart disease Mother   . Breast cancer Mother        with mets to Bone  . Prostate cancer Father   . Brain cancer Father   . Allergic rhinitis Neg Hx   .  Angioedema Neg Hx   . Eczema Neg Hx   . Immunodeficiency Neg Hx   . Urticaria Neg Hx     Social History Social History  Substance Use Topics  . Smoking status: Passive Smoke Exposure - Never Smoker  . Smokeless tobacco: Never Used     Comment: husband smokes in the house  . Alcohol use No     Allergies   Doxycycline; Levaquin [levofloxacin in d5w]; and Prednisone   Review of Systems Review of Systems  Constitutional: Negative for chills and fever.  HENT: Negative for congestion, rhinorrhea and sore throat.   Respiratory: Negative for cough and shortness of breath.   Cardiovascular: Negative for chest pain.  Gastrointestinal: Positive for abdominal pain and nausea. Negative for blood in stool, constipation, diarrhea and vomiting.  Genitourinary: Negative for dysuria and hematuria.  Musculoskeletal: Negative for back pain and neck pain.  Skin: Negative for rash.  Neurological: Positive for dizziness, weakness and light-headedness. Negative for numbness and headaches.  All other systems reviewed and are negative.    Physical Exam Updated Vital Signs BP 119/65   Pulse 66   Temp 97.6 F (36.4 C) (Oral)   Resp 16   SpO2 96%   Physical Exam  Constitutional: She is oriented to person, place, and time. She appears well-developed and well-nourished.  Sitting comfortably on examination table  HENT:  Head: Normocephalic and  atraumatic.  Mouth/Throat: Uvula is midline and oropharynx is clear and moist. Mucous membranes are dry.  Eyes: Conjunctivae, EOM and lids are normal. Pupils are equal, round, and reactive to light.  Neck: Full passive range of motion without pain.  Cardiovascular: Normal rate, regular rhythm, normal heart sounds and normal pulses.  Exam reveals no gallop and no friction rub.   No murmur heard. Pulmonary/Chest: Effort normal and breath sounds normal.  Abdominal: Soft. Normal appearance. She exhibits no distension. Bowel sounds are decreased. There is tenderness in the epigastric area and left upper quadrant. There is no rigidity and no guarding.  Abdomen is soft, nondistended. She does have some moderate tenderness to the epigastric and left upper quadrant. No peritoneal signs.  Musculoskeletal: Normal range of motion.  Neurological: She is alert and oriented to person, place, and time.  Skin: Skin is warm and dry. Capillary refill takes less than 2 seconds.  Small, scattered petechiae on abdomen but patient states this is chronic.  Psychiatric: She has a normal mood and affect. Her speech is normal.  Nursing note and vitals reviewed.    ED Treatments / Results  Labs (all labs ordered are listed, but only abnormal results are displayed) Labs Reviewed  COMPREHENSIVE METABOLIC PANEL - Abnormal; Notable for the following:       Result Value   Glucose, Bld 116 (*)    BUN 24 (*)    Creatinine, Ser 1.04 (*)    GFR calc non Af Amer 58 (*)    All other components within normal limits  CBC - Abnormal; Notable for the following:    WBC 11.8 (*)    All other components within normal limits  URINALYSIS, ROUTINE W REFLEX MICROSCOPIC - Abnormal; Notable for the following:    Specific Gravity, Urine 1.043 (*)    All other components within normal limits  LIPASE, BLOOD  I-STAT CG4 LACTIC ACID, ED  Randolm Idol, ED  I-STAT CG4 LACTIC ACID, ED    EKG  EKG  Interpretation  Date/Time:  Tuesday Jan 14 2017 16:13:13 EDT Ventricular Rate:  68 PR  Interval:    QRS Duration: 129 QT Interval:  423 QTC Calculation: 450 R Axis:   116 Text Interpretation:  Right and left arm electrode reversal, interpretation assumes no reversal Sinus rhythm Nonspecific intraventricular conduction delay Probable lateral infarct, old When compared to prior, no significant chacnges seen.  No STEMI Confirmed by Antony Blackbird [phone]) on 01/14/2017 5:57:32 PM       Radiology Ct Abdomen Pelvis W Contrast  Result Date: 01/14/2017 CLINICAL DATA:  61 year old hypertensive female with upper abdominal pain starting last night. Initial encounter. EXAM: CT ABDOMEN AND PELVIS WITH CONTRAST TECHNIQUE: Multidetector CT imaging of the abdomen and pelvis was performed using the standard protocol following bolus administration of intravenous contrast. CONTRAST:  <See Chart> ISOVUE-300 IOPAMIDOL (ISOVUE-300) INJECTION 61% COMPARISON:  12/21/2008 CT. FINDINGS: Lower chest: Minimal basilar scarring/subsegmental atelectasis. Heart size within normal limits. Hepatobiliary: Top-normal size liver with mild fatty infiltration without focal worrisome lesion. 2.4 cm gallstone. No CT findings to suggest gallbladder inflammation however, ultrasound would be more sensitive for detection of such if of clinical concern. Pancreas: No mass or inflammation. Spleen: No mass or enlargement. Accessory splenic tissue incidentally Adrenals/Urinary Tract: No renal or ureteral obstructing stone or hydronephrosis. No renal or adrenal mass. Hyperdense material within the dependent aspect the urinary bladder may reflect contrast and limits evaluation for the possibility of bladder lesion. Stomach/Bowel: Under distended distal esophagus. Under distended stomach. Evaluation therefore limited but without gross abnormality. Post small bowel surgery (distal ilium level). Mild prominence at anastomotic level. No extraluminal bowel  inflammatory process. Specifically, no inflammation surrounds the appendix. Vascular/Lymphatic: No aneurysm or large vessel occlusion. No adenopathy. Reproductive: No worrisome adnexal or uterine abnormality noted. Other: No free intraperitoneal air.  No bowel containing hernia. Musculoskeletal: Degenerative changes lower lumbar spine with L4-5 bilateral foraminal narrowing. Facet degenerative changes with grade 1 slip L5 and moderate L5-S1 disc space narrowing. IMPRESSION: 2.4 cm gallstone. No CT findings to suggest gallbladder inflammation. Ultrasound would be more sensitive for detection of such if of clinical concern. Post small bowel surgery (distal ilium level). Portions of distal esophagus, stomach, small bowel and colon are under distended slightly limiting evaluation however, no extraluminal bowel inflammatory process. Specifically, no inflammation surrounds the appendix. Top-normal size liver with mild fatty infiltration. Evaluation of urinary bladder is limited by the presence of small amount of contrast. Degenerative changes lower lumbar spine. Electronically Signed   By: Genia Del M.D.   On: 01/14/2017 17:04   US Abdomen Limited Ruq  Result Date: 01/14/2017 CLINICAL DATA:  Gallstone EXAM: US ABDOMEN LIMITED - RIGHT UPPER QUADRANT COMPARISON:  CT abdomen pelvis 01/14/2017 FINDINGS: Gallbladder: Large gallstone measuring 2.9 cm. Negative for gallbladder wall thickening. Negative for sonographic Murphy sign Common bile duct: Diameter: 4 mm Liver: No focal lesion identified. Within normal limits in parenchymal echogenicity. IMPRESSION: 2.9 cm gallstone.  No findings of acute cholecystitis. Electronically Signed   By: Franchot Gallo M.D.   On: 01/14/2017 18:48    Procedures Procedures (including critical care time)  Medications Ordered in ED Medications  sodium chloride 0.9 % bolus 1,000 mL (0 mLs Intravenous Stopped 01/14/17 1814)  iopamidol (ISOVUE-300) 61 % injection (100 mLs  Contrast  Given 01/14/17 1623)     Initial Impression / Assessment and Plan / ED Course  I have reviewed the triage vital signs and the nursing notes.  Pertinent labs & imaging results that were available during my care of the patient were reviewed by me and considered in my medical decision  making (see chart for details).     61 year old female who presents with abdominal pain that began at 12 PM this afternoon. Patient initially states the pain was so intense that she felt like she was going to pass out. On initial ED arrival patient was hypotensive and bradycardic, but afebrile. On initial examination, patient's BP and heart rate have improved. Consider acute infectious etiology versus pancreatitis versus small bowel obstruction versus constipation. History/physical exam are not concerning for kidney stone or a mesenteric ischemia. Initial labs ordered at triage, including CBC, CMP, lipase, UA, lactic acid. Will additionally order EKG and i-STAT troponin. Will CT abdomen pelvis to evaluate for any acute abdomen abnormalities. IVF given for fluid resuscitation. Patient offered analgesics and antiemetics but she declined at this time.   Labs and imaging reviewed. CBC with mildly elevated white blood cell count 11.8. CMP shows slight BUN/creatinine elevation. Review of records show that it is close to what she normally is. Lactic acid negative. Lipase negative. Troponin negative. UA negative for any acute signs of infection. EKG sinus rhythm rate 68. Intraventricular conduction delay. Otherwise no abnormalities. CT abdomen shows a 2.4 cm gallstone present. No signs of obstruction or acute infectious etiology.  Re-evaluation: Reexamination. Patient still with no complaint of right upper quadrant tenderness. He does have some mild epigastric and left upper quadrant tenderness with palpation. Patient still reports improvement in pain. Offered analgesics again but patient declines at this time, stating her pain has  improved. Discussed results with patient. Given given the presence of a gallstone seen on CT, we'll evaluate with a testicular right upper quadrant ultrasound to evaluate for acute cholecystitis.  Ultrasound review. Shows 2.9 cm gallstone. Bile duct measures 4 mm. No gallbladder thickening. No evidence of acute cholecystitis. Discussed results with patient. On reexamination she is still mildly tender to the epigastric region. We discussed potentially discharge her home given that labs and other imaging is unremarkable. Patient states that she is concerned about going home and having another episode of pain. We discussed the possibility of having her gallbladder removed. Patient is wanting to have intermittent as soon as possible to avoid any further episodes of pain. Discussed with her that we can attempt to call surgery and presented the case  7:59 PM: Consult to surgery placed.   8:15 PM: Discussed with Dr. Barry Dienes (on call for surgery) provided excellent consultation.. Given that patient has had improvement in pain in the emergency department and that she has a mild leukocytosis and she is without acute cholecystitis, she recommends attempting PO trial in the emergency department see if that induces patient's pain. We discussed at length the possibility of admitting patient but she also brought up the fact the patient could be waiting days to have her gallbladder removed if acute surgeries appear.\\  Discussed with patient. Explained that we'll try to give her something to eat and see if her pain returns. Explained her that even if she is admitted there is a possibility she may not get it out for several days. Patient and I discussed this at length. She feels that her pain has improved significantly since being in the emergency department and feels comfortable to go home. At this time she would rather go home and follow-up with surgery on an outpatient basis. I feel that this is reasonable given her  overall well appearance and lab work. Patient instructed to follow-up with her primary care doctor in 24-48 hours. Patient instructed to follow-up with referred surgical referral for outpatient  follow-up of gallbladder removal. Will plan to send home patient with a short course of pain medication for any acute pain that she sentences. Strict return precautions discussed. Patient presents understanding and agreement to plan.    Final Clinical Impressions(s) / ED Diagnoses   Final diagnoses:  Gallstone  Epigastric pain    New Prescriptions Discharge Medication List as of 01/14/2017  8:29 PM    START taking these medications   Details  HYDROcodone-acetaminophen (NORCO/VICODIN) 5-325 MG tablet Take 2 tablets by mouth every 4 (four) hours as needed., Starting Tue 01/14/2017, Print         Volanda Napoleon, PA-C 01/14/17 2205    Tegeler, Gwenyth Allegra, MD 01/15/17 1104

## 2017-01-14 NOTE — ED Triage Notes (Signed)
Pt sts severe abd pain with N/V that caused near syncope that is now some improved; pt noted to be hypotensive and has hx of abd sx and thinks she has a blockage

## 2017-01-16 ENCOUNTER — Ambulatory Visit: Payer: Self-pay | Admitting: General Surgery

## 2017-01-16 ENCOUNTER — Encounter: Payer: Self-pay | Admitting: Internal Medicine

## 2017-01-16 DIAGNOSIS — K8020 Calculus of gallbladder without cholecystitis without obstruction: Secondary | ICD-10-CM | POA: Diagnosis not present

## 2017-02-07 ENCOUNTER — Other Ambulatory Visit: Payer: Self-pay | Admitting: Internal Medicine

## 2017-02-11 NOTE — Pre-Procedure Instructions (Addendum)
Kristina Webb  02/11/2017      [id number] [place], [place] - [address] [place] [address] Phone: [phone] Fax: [phone]  [facility] [place] [place] [facility] Portal to [facility] [employer] [place] [address] Phone: [phone] Fax: [phone]    Your procedure is scheduled on June 28  Report to [facility] Admitting at 0800 A.M.  Call this number if you have problems the morning of surgery:  [phone]   Remember:  Do not eat food or drink liquids after midnight.   Take these medicines the morning of surgery with A SIP OF WATER acetaminophen (TYLENOL) if needed, albuterol (PROAIR HFA) bring with you ,  Chlorpheniramine, levothyroxine (SYNTHROID, LEVOTHROID), montelukast (SINGULAIR), pantoprazole (PROTONIX) or ranitidine (ZANTAC) which ever you are taking, SUMAtriptan (IMITREX) if needed,   7 days prior to surgery STOP taking any Aspirin, Aleve, Naproxen, Ibuprofen, Motrin, Advil, Goody's, BC's, all herbal medications, fish oil, and all vitamins   WHAT DO I DO ABOUT MY DIABETES MEDICATION?   Marland Kitchen Do not take oral diabetes medicines (pills) the morning of surgery. metFORMIN (GLUCOPHAGE-XR)   How to Manage Your Diabetes Before and After Surgery  Why is it important to control my blood sugar before and after surgery? . Improving blood sugar levels before and after surgery helps healing and can limit problems. . A way of improving blood sugar control is eating a healthy diet by: o  Eating less sugar and carbohydrates o  Increasing activity/exercise o  Talking with your doctor about reaching your blood sugar goals . High blood sugars (greater than 180 mg/dL) can raise your risk of infections and slow your recovery, so you will need to focus on controlling your diabetes during the weeks before surgery. . Make sure that the doctor  who takes care of your diabetes knows about your planned surgery including the date and location.  How do I manage my blood sugar before surgery? . Check your blood sugar at least 4 times a day, starting 2 days before surgery, to make sure that the level is not too high or low. o Check your blood sugar the morning of your surgery when you wake up and every 2 hours until you get to the Short Stay unit. . If your blood sugar is less than 70 mg/dL, you will need to treat for low blood sugar: o Do not take insulin. o Treat a low blood sugar (less than 70 mg/dL) with  cup of clear juice (cranberry or apple), 4 glucose tablets, OR glucose gel. o Recheck blood sugar in 15 minutes after treatment (to make sure it is greater than 70 mg/dL). If your blood sugar is not greater than 70 mg/dL on recheck, call [phone] for further instructions. . Report your blood sugar to the short stay nurse when you get to Short Stay.  . If you are admitted to the hospital after surgery: o Your blood sugar will be checked by the staff and you will probably be given insulin after surgery (instead of oral diabetes medicines) to make sure you have good blood sugar levels. o The goal for blood sugar control after surgery is 80-180 mg/dL.   Do not wear jewelry, make-up or nail polish.  Do not wear lotions, powders, or perfumes, or deoderant.  Do not shave 48 hours prior to surgery.   Do not  bring valuables to the hospital.  [facility] is not responsible for any belongings or valuables.  Contacts, dentures or bridgework may not be worn into surgery.  Leave your suitcase in the car.  After surgery it may be brought to your room.  For patients admitted to the hospital, discharge time will be determined by your treatment team.  Patients discharged the day of surgery will not be allowed to drive home.  Special instructions:   [facility]- Preparing For Surgery  Before surgery, you can play an important role. Because  skin is not sterile, your skin needs to be as free of germs as possible. You can reduce the number of germs on your skin by washing with CHG (chlorahexidine gluconate) Soap before surgery.  CHG is an antiseptic cleaner which kills germs and bonds with the skin to continue killing germs even after washing.  Please do not use if you have an allergy to CHG or antibacterial soaps. If your skin becomes reddened/irritated stop using the CHG.  Do not shave (including legs and underarms) for at least 48 hours prior to first CHG shower. It is OK to shave your face.  Please follow these instructions carefully.   1. Shower the NIGHT BEFORE SURGERY and the MORNING OF SURGERY with CHG.   2. If you chose to wash your hair, wash your hair first as usual with your normal shampoo.  3. After you shampoo, rinse your hair and body thoroughly to remove the shampoo.  4. Use CHG as you would any other liquid soap. You can apply CHG directly to the skin and wash gently with a scrungie or a clean washcloth.   5. Apply the CHG Soap to your body ONLY FROM THE NECK DOWN.  Do not use on open wounds or open sores. Avoid contact with your eyes, ears, mouth and genitals (private parts). Wash genitals (private parts) with your normal soap.  6. Wash thoroughly, paying special attention to the area where your surgery will be performed.  7. Thoroughly rinse your body with warm water from the neck down.  8. DO NOT shower/wash with your normal soap after using and rinsing off the CHG Soap.  9. Pat yourself dry with a CLEAN TOWEL.   10. Wear CLEAN PAJAMAS   11. Place CLEAN SHEETS on your bed the night of your first shower and DO NOT SLEEP WITH PETS.    Day of Surgery: Do not apply any deodorants/lotions. Please wear clean clothes to the hospital/surgery center.      Please read over the following fact sheets that you were given.

## 2017-02-12 ENCOUNTER — Encounter (HOSPITAL_COMMUNITY): Payer: Self-pay

## 2017-02-12 ENCOUNTER — Encounter (HOSPITAL_COMMUNITY)
Admission: RE | Admit: 2017-02-12 | Discharge: 2017-02-12 | Disposition: A | Discharge status: Home / Self Care | Payer: 59 | Source: Ambulatory Visit | Attending: General Surgery | Admitting: General Surgery

## 2017-02-12 DIAGNOSIS — Z01818 Encounter for other preprocedural examination: Secondary | ICD-10-CM | POA: Insufficient documentation

## 2017-02-12 DIAGNOSIS — J45909 Unspecified asthma, uncomplicated: Secondary | ICD-10-CM | POA: Diagnosis not present

## 2017-02-12 DIAGNOSIS — I10 Essential (primary) hypertension: Secondary | ICD-10-CM | POA: Insufficient documentation

## 2017-02-12 DIAGNOSIS — Z79899 Other long term (current) drug therapy: Secondary | ICD-10-CM | POA: Insufficient documentation

## 2017-02-12 DIAGNOSIS — R7303 Prediabetes: Secondary | ICD-10-CM | POA: Diagnosis not present

## 2017-02-12 DIAGNOSIS — E039 Hypothyroidism, unspecified: Secondary | ICD-10-CM | POA: Diagnosis not present

## 2017-02-12 DIAGNOSIS — Z7984 Long term (current) use of oral hypoglycemic drugs: Secondary | ICD-10-CM | POA: Insufficient documentation

## 2017-02-12 DIAGNOSIS — Z7951 Long term (current) use of inhaled steroids: Secondary | ICD-10-CM | POA: Diagnosis not present

## 2017-02-12 DIAGNOSIS — G4733 Obstructive sleep apnea (adult) (pediatric): Secondary | ICD-10-CM | POA: Insufficient documentation

## 2017-02-12 DIAGNOSIS — Z01812 Encounter for preprocedural laboratory examination: Secondary | ICD-10-CM | POA: Diagnosis not present

## 2017-02-12 DIAGNOSIS — K8020 Calculus of gallbladder without cholecystitis without obstruction: Secondary | ICD-10-CM | POA: Diagnosis not present

## 2017-02-12 DIAGNOSIS — E785 Hyperlipidemia, unspecified: Secondary | ICD-10-CM | POA: Diagnosis not present

## 2017-02-12 DIAGNOSIS — K219 Gastro-esophageal reflux disease without esophagitis: Secondary | ICD-10-CM | POA: Diagnosis not present

## 2017-02-12 HISTORY — DX: Hypothyroidism, unspecified: E03.9

## 2017-02-12 HISTORY — DX: Prediabetes: R73.03

## 2017-02-12 LAB — BASIC METABOLIC PANEL
Anion gap: 9 (ref 5–15)
BUN: 17 mg/dL (ref 6–20)
CO2: 26 mmol/L (ref 22–32)
Calcium: 9.3 mg/dL (ref 8.9–10.3)
Chloride: 104 mmol/L (ref 101–111)
Creatinine, Ser: 0.88 mg/dL (ref 0.44–1.00)
GFR calc Af Amer: >60 mL/min (ref >60)
GFR calc non Af Amer: >60 mL/min (ref >60)
Glucose, Bld: 100 mg/dL — ABNORMAL HIGH (ref 65–99)
Potassium: 4.2 mmol/L (ref 3.5–5.1)
Sodium: 139 mmol/L (ref 135–145)

## 2017-02-12 LAB — CBC
HCT: 40.5 % (ref 36.0–46.0)
Hemoglobin: 12.5 g/dL (ref 12.0–15.0)
MCH: 26.2 pg (ref 26.0–34.0)
MCHC: 30.9 g/dL (ref 30.0–36.0)
MCV: 84.7 fL (ref 78.0–100.0)
Platelets: 265 10*3/uL (ref 150–400)
RBC: 4.78 MIL/uL (ref 3.87–5.11)
RDW: 15.1 % (ref 11.5–15.5)
WBC: 6.7 10*3/uL (ref 4.0–10.5)

## 2017-02-12 LAB — GLUCOSE, CAPILLARY: Glucose-Capillary: 108 mg/dL — ABNORMAL HIGH (ref 65–99)

## 2017-02-12 NOTE — Pre-Procedure Instructions (Addendum)
RYE DORADO  02/12/2017      [id number] [place], [place] - [address] [facility] [address] [place] [address] Phone: [phone] Fax: [phone]  [facility] [place] [place] [facility] [employer] [facility] [place] [address] Phone: [phone] Fax: [phone]    Your procedure is scheduled on June 28  Report to [facility] Admitting at 0800 A.M.  Call this number if you have problems the morning of surgery:  [phone]   Remember:  Do not eat food or drink liquids after midnight.  Drink 8oz water at [employer] day of surgery   Take these medicines the morning of surgery with A SIP OF WATER acetaminophen (TYLENOL) if needed, albuterol (PROAIR HFA) bring with you ,  Chlorpheniramine, levothyroxine (SYNTHROID, LEVOTHROID), montelukast (SINGULAIR), pantoprazole (PROTONIX) or ranitidine (ZANTAC) which ever you are taking, SUMAtriptan (IMITREX) if needed,   7 days prior to surgery 02/13/17 STOP taking any Aspirin, Aleve, Naproxen, Ibuprofen, Motrin, Advil, Goody's, BC's, all herbal medications, fish oil, and all vitamins   WHAT DO I DO ABOUT MY DIABETES MEDICATION?   Marland Kitchen Do not take oral diabetes medicines (pills) the morning of surgery. metFORMIN (GLUCOPHAGE-XR)   How to Manage Your Diabetes Before and After Surgery  Why is it important to control my blood sugar before and after surgery? . Improving blood sugar levels before and after surgery helps healing and can limit problems. . A way of improving blood sugar control is eating a healthy diet by: o  Eating less sugar and carbohydrates o  Increasing activity/exercise o  Talking with your doctor about reaching your blood sugar goals . High blood sugars (greater than 180 mg/dL) can raise your risk of infections and slow your recovery, so you will need to focus on controlling your diabetes during the  weeks before surgery. . Make sure that the doctor who takes care of your diabetes knows about your planned surgery including the date and location.  How do I manage my blood sugar before surgery? . Check your blood sugar at least 4 times a day, starting 2 days before surgery, to make sure that the level is not too high or low. o Check your blood sugar the morning of your surgery when you wake up and every 2 hours until you get to the Short Stay unit. . If your blood sugar is less than 70 mg/dL, you will need to treat for low blood sugar: o Do not take insulin. o Treat a low blood sugar (less than 70 mg/dL) with  cup of clear juice (cranberry or apple), 4 glucose tablets, OR glucose gel. o Recheck blood sugar in 15 minutes after treatment (to make sure it is greater than 70 mg/dL). If your blood sugar is not greater than 70 mg/dL on recheck, call [phone] for further instructions. . Report your blood sugar to the short stay nurse when you get to Short Stay.  . If you are admitted to the hospital after surgery: o Your blood sugar will be checked by the staff and you will probably be given insulin after surgery (instead of oral diabetes medicines) to make sure you have good blood sugar levels. o The goal for blood sugar control after surgery is 80-180 mg/dL.   Do not wear jewelry, make-up or nail polish.  Do not wear lotions, powders, or perfumes, or deoderant.  Do not  shave 48 hours prior to surgery.   Do not bring valuables to the hospital.  [facility] is not responsible for any belongings or valuables.  Contacts, dentures or bridgework may not be worn into surgery.  Leave your suitcase in the car.  After surgery it may be brought to your room.  For patients admitted to the hospital, discharge time will be determined by your treatment team.  Patients discharged the day of surgery will not be allowed to drive home.  Special instructions:   [facility]- Preparing For Surgery  Before  surgery, you can play an important role. Because skin is not sterile, your skin needs to be as free of germs as possible. You can reduce the number of germs on your skin by washing with CHG (chlorahexidine gluconate) Soap before surgery.  CHG is an antiseptic cleaner which kills germs and bonds with the skin to continue killing germs even after washing.  Please do not use if you have an allergy to CHG or antibacterial soaps. If your skin becomes reddened/irritated stop using the CHG.  Do not shave (including legs and underarms) for at least 48 hours prior to first CHG shower. It is OK to shave your face.  Please follow these instructions carefully.   1. Shower the NIGHT BEFORE SURGERY and the MORNING OF SURGERY with CHG.   2. If you chose to wash your hair, wash your hair first as usual with your normal shampoo.  3. After you shampoo, rinse your hair and body thoroughly to remove the shampoo.  4. Use CHG as you would any other liquid soap. You can apply CHG directly to the skin and wash gently with a scrungie or a clean washcloth.   5. Apply the CHG Soap to your body ONLY FROM THE NECK DOWN.  Do not use on open wounds or open sores. Avoid contact with your eyes, ears, mouth and genitals (private parts). Wash genitals (private parts) with your normal soap.  6. Wash thoroughly, paying special attention to the area where your surgery will be performed.  7. Thoroughly rinse your body with warm water from the neck down.  8. DO NOT shower/wash with your normal soap after using and rinsing off the CHG Soap.  9. Pat yourself dry with a CLEAN TOWEL.   10. Wear CLEAN PAJAMAS   11. Place CLEAN SHEETS on your bed the night of your first shower and DO NOT SLEEP WITH PETS.    Day of Surgery: Do not apply any deodorants/lotions. Please wear clean clothes to the hospital/surgery center.      Please read over the following fact sheets that you were given.

## 2017-02-13 LAB — HEMOGLOBIN A1C
Hgb A1c MFr Bld: 5.6 % (ref 4.8–5.6)
Mean Plasma Glucose: 114 mg/dL

## 2017-02-13 NOTE — Progress Notes (Signed)
Anesthesia Chart Review:  Pt is a 61 year old female scheduled for laparoscopic cholecystectomy with intraoperative cholangiogram on 02/20/2017 with Jovita Kussmaul, MD  - PCP is Unk Pinto, MD  PMH includes:  HTN, pre-diabetes, hyperlipidemia, anemia, OSA, asthma, hypothyroidism, GERD.  Never smoker. BMI 34. S/p L breast lumpectomy 02/02/16.   - ED visit 01/14/17 for abdominal pain, found to be cholecystitis.   Medications include: albuterol, breo ellipta, lasix, levothyroxine, losartan, metformin, protonix, zantac.    Preoperative labs reviewed.  HbA1c 5.6, glucose 100  CXR 09/12/16: Stable exam.  No active cardiopulmonary disease.  EKG 01/14/17: Right and left arm electrode reversal, interpretation assumes no reversal. Sinus rhythm. Nonspecific intraventricular conduction delay. 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   Willeen Cass, FNP-BC [facility]/Anesthesiology Phone: [phone] 02/13/2017 1:20 PM

## 2017-02-20 ENCOUNTER — Encounter (HOSPITAL_COMMUNITY)
Admission: RE | Disposition: A | Discharge status: Home / Self Care | Payer: Self-pay | Source: Ambulatory Visit | Attending: General Surgery

## 2017-02-20 ENCOUNTER — Ambulatory Visit (HOSPITAL_COMMUNITY)
Admission: RE | Admit: 2017-02-20 | Discharge: 2017-02-20 | Disposition: A | Discharge status: Home / Self Care | Payer: 59 | Source: Ambulatory Visit | Attending: General Surgery | Admitting: General Surgery

## 2017-02-20 ENCOUNTER — Ambulatory Visit (HOSPITAL_COMMUNITY): Payer: 59 | Admitting: Certified Registered Nurse Anesthetist

## 2017-02-20 ENCOUNTER — Ambulatory Visit (HOSPITAL_COMMUNITY): Payer: 59 | Admitting: Emergency Medicine

## 2017-02-20 ENCOUNTER — Encounter (HOSPITAL_COMMUNITY): Payer: Self-pay | Admitting: *Deleted

## 2017-02-20 ENCOUNTER — Ambulatory Visit (HOSPITAL_COMMUNITY): Payer: 59

## 2017-02-20 DIAGNOSIS — I10 Essential (primary) hypertension: Secondary | ICD-10-CM | POA: Diagnosis not present

## 2017-02-20 DIAGNOSIS — Z7984 Long term (current) use of oral hypoglycemic drugs: Secondary | ICD-10-CM | POA: Diagnosis not present

## 2017-02-20 DIAGNOSIS — K8010 Calculus of gallbladder with chronic cholecystitis without obstruction: Secondary | ICD-10-CM | POA: Diagnosis not present

## 2017-02-20 DIAGNOSIS — Z79899 Other long term (current) drug therapy: Secondary | ICD-10-CM | POA: Insufficient documentation

## 2017-02-20 DIAGNOSIS — E119 Type 2 diabetes mellitus without complications: Secondary | ICD-10-CM | POA: Diagnosis not present

## 2017-02-20 DIAGNOSIS — K8020 Calculus of gallbladder without cholecystitis without obstruction: Secondary | ICD-10-CM | POA: Diagnosis not present

## 2017-02-20 HISTORY — PX: CHOLECYSTECTOMY: SHX55

## 2017-02-20 LAB — GLUCOSE, CAPILLARY: Glucose-Capillary: 99 mg/dL (ref 65–99)

## 2017-02-20 SURGERY — LAPAROSCOPIC CHOLECYSTECTOMY WITH INTRAOPERATIVE CHOLANGIOGRAM
Anesthesia: General | Site: Abdomen

## 2017-02-20 MED ORDER — GABAPENTIN 300 MG PO CAPS
300.0000 mg | ORAL_CAPSULE | ORAL | Status: AC
  Administered 2017-02-20: 300 mg via ORAL
  Filled 2017-02-20: qty 1

## 2017-02-20 MED ORDER — EPHEDRINE SULFATE 50 MG/ML IJ SOLN
INTRAMUSCULAR | Status: DC | PRN
  Administered 2017-02-20: 10 mg via INTRAVENOUS

## 2017-02-20 MED ORDER — CHLORHEXIDINE GLUCONATE CLOTH 2 % EX PADS: 6.0000 | MEDICATED_PAD | Freq: Once | CUTANEOUS | Status: DC

## 2017-02-20 MED ORDER — PROPOFOL 10 MG/ML IV BOLUS
INTRAVENOUS | Status: DC | PRN
  Administered 2017-02-20: 160 mg via INTRAVENOUS

## 2017-02-20 MED ORDER — HYDROCODONE-ACETAMINOPHEN 5-325 MG PO TABS
1.0000 | ORAL_TABLET | Freq: Once | ORAL | Status: AC
  Administered 2017-02-20: 2 via ORAL

## 2017-02-20 MED ORDER — ACETAMINOPHEN 500 MG PO TABS
1000.0000 mg | ORAL_TABLET | ORAL | Status: AC
  Administered 2017-02-20: 1000 mg via ORAL
  Filled 2017-02-20: qty 2

## 2017-02-20 MED ORDER — MIDAZOLAM HCL 5 MG/5ML IJ SOLN
INTRAMUSCULAR | Status: DC | PRN
  Administered 2017-02-20: 2 mg via INTRAVENOUS

## 2017-02-20 MED ORDER — 0.9 % SODIUM CHLORIDE (POUR BTL) OPTIME
TOPICAL | Status: DC | PRN
  Administered 2017-02-20: 1000 mL

## 2017-02-20 MED ORDER — ONDANSETRON HCL 4 MG/2ML IJ SOLN
INTRAMUSCULAR | Status: AC
  Filled 2017-02-20: qty 2

## 2017-02-20 MED ORDER — CEFAZOLIN SODIUM-DEXTROSE 2-4 GM/100ML-% IV SOLN
2.0000 g | INTRAVENOUS | Status: AC
  Administered 2017-02-20: 2 g via INTRAVENOUS
  Filled 2017-02-20: qty 100

## 2017-02-20 MED ORDER — ROCURONIUM BROMIDE 100 MG/10ML IV SOLN
INTRAVENOUS | Status: DC | PRN
  Administered 2017-02-20: 20 mg via INTRAVENOUS
  Administered 2017-02-20: 50 mg via INTRAVENOUS

## 2017-02-20 MED ORDER — HYDROCODONE-ACETAMINOPHEN 5-325 MG PO TABS: 1.0000 | ORAL_TABLET | ORAL | 0 refills | Status: DC | PRN

## 2017-02-20 MED ORDER — HYDROMORPHONE HCL 1 MG/ML IJ SOLN
0.2500 mg | INTRAMUSCULAR | Status: DC | PRN
  Administered 2017-02-20: 0.5 mg via INTRAVENOUS

## 2017-02-20 MED ORDER — BUPIVACAINE-EPINEPHRINE 0.25% -1:200000 IJ SOLN
INTRAMUSCULAR | Status: DC | PRN
  Administered 2017-02-20: 10 mL

## 2017-02-20 MED ORDER — CELECOXIB 200 MG PO CAPS
400.0000 mg | ORAL_CAPSULE | ORAL | Status: AC
  Administered 2017-02-20: 400 mg via ORAL
  Filled 2017-02-20: qty 2

## 2017-02-20 MED ORDER — HYDROCODONE-ACETAMINOPHEN 5-325 MG PO TABS
ORAL_TABLET | ORAL | Status: AC
  Filled 2017-02-20: qty 2

## 2017-02-20 MED ORDER — LIDOCAINE HCL (CARDIAC) 20 MG/ML IV SOLN
INTRAVENOUS | Status: DC | PRN
  Administered 2017-02-20: 70 mg via INTRAVENOUS

## 2017-02-20 MED ORDER — HYDROMORPHONE HCL 1 MG/ML IJ SOLN
INTRAMUSCULAR | Status: AC
  Filled 2017-02-20: qty 0.5

## 2017-02-20 MED ORDER — FENTANYL CITRATE (PF) 100 MCG/2ML IJ SOLN
INTRAMUSCULAR | Status: DC | PRN
Start: 2017-02-20 — End: 2017-02-20
  Administered 2017-02-20 (×4): 50 ug via INTRAVENOUS

## 2017-02-20 MED ORDER — NEOSTIGMINE METHYLSULFATE 10 MG/10ML IV SOLN
INTRAVENOUS | Status: DC | PRN
  Administered 2017-02-20: 4 mg via INTRAVENOUS

## 2017-02-20 MED ORDER — GLYCOPYRROLATE 0.2 MG/ML IJ SOLN
INTRAMUSCULAR | Status: DC | PRN
Start: 2017-02-20 — End: 2017-02-20
  Administered 2017-02-20: 0.6 mg via INTRAVENOUS
  Administered 2017-02-20: 0.2 mg via INTRAVENOUS
  Administered 2017-02-20 (×2): 0.1 mg via INTRAVENOUS

## 2017-02-20 MED ORDER — MIDAZOLAM HCL 2 MG/2ML IJ SOLN
INTRAMUSCULAR | Status: AC
  Filled 2017-02-20: qty 2

## 2017-02-20 MED ORDER — BUPIVACAINE-EPINEPHRINE (PF) 0.25% -1:200000 IJ SOLN
INTRAMUSCULAR | Status: AC
  Filled 2017-02-20: qty 30

## 2017-02-20 MED ORDER — DEXAMETHASONE SODIUM PHOSPHATE 10 MG/ML IJ SOLN
INTRAMUSCULAR | Status: AC
  Filled 2017-02-20: qty 1

## 2017-02-20 MED ORDER — SODIUM CHLORIDE 0.9 % IV SOLN
INTRAVENOUS | Status: DC | PRN
  Administered 2017-02-20: 5 mL

## 2017-02-20 MED ORDER — FENTANYL CITRATE (PF) 250 MCG/5ML IJ SOLN
INTRAMUSCULAR | Status: AC
  Filled 2017-02-20: qty 5

## 2017-02-20 MED ORDER — DEXAMETHASONE SODIUM PHOSPHATE 10 MG/ML IJ SOLN
INTRAMUSCULAR | Status: DC | PRN
  Administered 2017-02-20: 10 mg via INTRAVENOUS

## 2017-02-20 MED ORDER — SODIUM CHLORIDE 0.9 % IR SOLN
Status: DC | PRN
  Administered 2017-02-20: 1000 mL

## 2017-02-20 MED ORDER — IOPAMIDOL (ISOVUE-300) INJECTION 61%
INTRAVENOUS | Status: AC
  Filled 2017-02-20: qty 50

## 2017-02-20 MED ORDER — LACTATED RINGERS IV SOLN
INTRAVENOUS | Status: DC
Start: 2017-02-20 — End: 2017-02-20
  Administered 2017-02-20: 09:00:00 via INTRAVENOUS

## 2017-02-20 MED ORDER — PROMETHAZINE HCL 25 MG/ML IJ SOLN: 6.2500 mg | INTRAMUSCULAR | Status: DC | PRN

## 2017-02-20 SURGICAL SUPPLY — 36 items
ADH SKN CLS APL DERMABOND .7 (GAUZE/BANDAGES/DRESSINGS) ×1
APPLIER CLIP 5 13 M/L LIGAMAX5 (MISCELLANEOUS) ×2
APR CLP MED LRG 5 ANG JAW (MISCELLANEOUS) ×1
BAG SPEC RTRVL LRG 6X4 10 (ENDOMECHANICALS) ×1
BLADE CLIPPER SURG (BLADE) IMPLANT
CANISTER SUCT 3000ML PPV (MISCELLANEOUS) ×2 IMPLANT
CATH REDDICK CHOLANGI 4FR 50CM (CATHETERS) ×2 IMPLANT
CHLORAPREP W/TINT 26ML (MISCELLANEOUS) ×2 IMPLANT
CLIP APPLIE 5 13 M/L LIGAMAX5 (MISCELLANEOUS) ×1 IMPLANT
COVER MAYO STAND STRL (DRAPES) ×2 IMPLANT
COVER SURGICAL LIGHT HANDLE (MISCELLANEOUS) ×2 IMPLANT
DERMABOND ADVANCED (GAUZE/BANDAGES/DRESSINGS) ×1
DERMABOND ADVANCED .7 DNX12 (GAUZE/BANDAGES/DRESSINGS) ×1 IMPLANT
DRAPE C-ARM 42X72 X-RAY (DRAPES) ×2 IMPLANT
ELECT REM PT RETURN 9FT ADLT (ELECTROSURGICAL) ×2
ELECTRODE REM PT RTRN 9FT ADLT (ELECTROSURGICAL) ×1 IMPLANT
GLOVE BIO SURGEON STRL SZ7.5 (GLOVE) ×2 IMPLANT
GOWN STRL REUS W/ TWL LRG LVL3 (GOWN DISPOSABLE) ×3 IMPLANT
GOWN STRL REUS W/TWL LRG LVL3 (GOWN DISPOSABLE) ×6
IV CATH 14GX2 1/4 (CATHETERS) ×3 IMPLANT
KIT BASIN OR (CUSTOM PROCEDURE TRAY) ×2 IMPLANT
KIT ROOM TURNOVER OR (KITS) ×2 IMPLANT
NS IRRIG 1000ML POUR BTL (IV SOLUTION) ×2 IMPLANT
PAD ARMBOARD 7.5X6 YLW CONV (MISCELLANEOUS) ×2 IMPLANT
POUCH SPECIMEN RETRIEVAL 10MM (ENDOMECHANICALS) ×2 IMPLANT
SCISSORS LAP 5X35 DISP (ENDOMECHANICALS) ×2 IMPLANT
SET IRRIG TUBING LAPAROSCOPIC (IRRIGATION / IRRIGATOR) ×2 IMPLANT
SLEEVE ENDOPATH XCEL 5M (ENDOMECHANICALS) ×4 IMPLANT
SPECIMEN JAR SMALL (MISCELLANEOUS) ×2 IMPLANT
SUT MNCRL AB 4-0 PS2 18 (SUTURE) ×2 IMPLANT
TOWEL OR 17X24 6PK STRL BLUE (TOWEL DISPOSABLE) ×2 IMPLANT
TOWEL OR 17X26 10 PK STRL BLUE (TOWEL DISPOSABLE) ×1 IMPLANT
TRAY LAPAROSCOPIC MC (CUSTOM PROCEDURE TRAY) ×2 IMPLANT
TROCAR XCEL BLUNT TIP 100MML (ENDOMECHANICALS) ×2 IMPLANT
TROCAR XCEL NON-BLD 5MMX100MML (ENDOMECHANICALS) ×2 IMPLANT
TUBING INSUFFLATION (TUBING) ×2 IMPLANT

## 2017-02-20 NOTE — Anesthesia Procedure Notes (Signed)
Procedure Name: Intubation Date/Time: 02/20/2017 10:09 AM Performed by: Valda Favia Pre-anesthesia Checklist: Patient identified, Emergency Drugs available, Suction available, Patient being monitored and Timeout performed Patient Re-evaluated:Patient Re-evaluated prior to inductionOxygen Delivery Method: Circle system utilized Preoxygenation: Pre-oxygenation with 100% oxygen Intubation Type: IV induction Ventilation: Mask ventilation without difficulty Laryngoscope Size: Mac and 4 Grade View: Grade II Tube type: Oral Tube size: 7.0 mm Number of attempts: 1 Airway Equipment and Method: Stylet Placement Confirmation: ETT inserted through vocal cords under direct vision,  positive ETCO2 and breath sounds checked- equal and bilateral Secured at: 21 cm Tube secured with: Tape Dental Injury: Teeth and Oropharynx as per pre-operative assessment

## 2017-02-20 NOTE — H&P (Signed)
Kristina Webb  Location: [facility] Surgery Patient #: [id number] DOB: Jun 16, 1956 Married / Language: English / Race: White Female   History of Present Illness The patient is a 61 year old female who presents for a follow-up for Abdominal pain. The patient is a 61 year old white female who is an old patient of mine. She recently experienced an episode of severe epigastric pain. The pain was associated with nausea and vomiting. The pain was so severe that she feels as though she passed out. She went to the emergency department where a CT scan and ultrasound both showed a 3 cm stone in the gallbladder but no gallbladder wall thickening or ductal dilatation. Her liver functions and pancreatic enzymes were normal. In retrospect she has probably been having some milder but similar pains for quite some time.   Allergies  Allergies Reconciled   Medication History  MetFORMIN HCl ER (500MG  Tablet ER 24HR, Oral) Active. Sertraline HCl (100MG  Tablet, Oral) Active. ProAir HFA (108 (90 Base)MCG/ACT Aerosol Soln, Inhalation) Active. Estrace (0.1MG /GM Cream, Vaginal) Active. Furosemide (40MG  Tablet, Oral) Active. Losartan Potassium (100MG  Tablet, Oral) Active. Montelukast Sodium (10MG  Tablet, Oral) Active. Pantoprazole Sodium (40MG  Tablet DR, Oral) Active. RaNITidine HCl (300MG  Tablet, Oral) Active. Synthroid (75MCG Tablet, Oral) Active. Medications Reconciled    Review of Systems  General Not Present- Appetite Loss, Chills, Fatigue, Fever, Night Sweats, Weight Gain and Weight Loss. Skin Not Present- Change in Wart/Mole, Dryness, Hives, Jaundice, New Lesions, Non-Healing Wounds, Rash and Ulcer. HEENT Not Present- Earache, Hearing Loss, Hoarseness, Nose Bleed, Oral Ulcers, Ringing in the Ears, Seasonal Allergies, Sinus Pain, Sore Throat, Visual Disturbances, Wears glasses/contact lenses and Yellow Eyes. Breast Present- Breast Pain and Nipple Discharge. Not Present- Breast  Mass and Skin Changes. Cardiovascular Not Present- Chest Pain, Difficulty Breathing Lying Down, Leg Cramps, Palpitations, Rapid Heart Rate, Shortness of Breath and Swelling of Extremities. Gastrointestinal Not Present- Abdominal Pain, Bloating, Bloody Stool, Change in Bowel Habits, Chronic diarrhea, Constipation, Difficulty Swallowing, Excessive gas, Gets full quickly at meals, Hemorrhoids, Indigestion, Nausea, Rectal Pain and Vomiting. Female Genitourinary Not Present- Frequency, Nocturia, Painful Urination, Pelvic Pain and Urgency. Musculoskeletal Not Present- Back Pain, Joint Pain, Joint Stiffness, Muscle Pain, Muscle Weakness and Swelling of Extremities. Neurological Not Present- Decreased Memory, Fainting, Headaches, Numbness, Seizures, Tingling, Tremor, Trouble walking and Weakness. Psychiatric Not Present- Anxiety, Bipolar, Change in Sleep Pattern, Depression, Fearful and Frequent crying. Endocrine Not Present- Cold Intolerance, Excessive Hunger, Hair Changes, Heat Intolerance, Hot flashes and New Diabetes. Hematology Not Present- Easy Bruising, Excessive bleeding, Gland problems, HIV and Persistent Infections.  Vitals  Weight: 194.2 lb Height: 63in Body Surface Area: 1.91 m Body Mass Index: 34.4 kg/m  Temp.: 30F  Pulse: 92 (Regular)  BP: 132/80 (Sitting, Left Arm, Standard)       Physical Exam  General Mental Status-Alert. General Appearance-Consistent with stated age. Hydration-Well hydrated. Voice-Normal.  Head and Neck Head-normocephalic, atraumatic with no lesions or palpable masses. Trachea-midline. Thyroid Gland Characteristics - normal size and consistency.  Eye Eyeball - Bilateral-Extraocular movements intact. Sclera/Conjunctiva - Bilateral-No scleral icterus.  Chest and Lung Exam Chest and lung exam reveals -quiet, even and easy respiratory effort with no use of accessory muscles and on auscultation, normal breath sounds, no  adventitious sounds and normal vocal resonance. Inspection Chest Wall - Normal. Back - normal.  Cardiovascular Cardiovascular examination reveals -normal heart sounds, regular rate and rhythm with no murmurs and normal pedal pulses bilaterally.  Abdomen Note: The abdomen is soft with minimal epigastric tenderness today.  There is no palpable mass. She has a well-healed lower midline scar from a previous small bowel resection.   Neurologic Neurologic evaluation reveals -alert and oriented x 3 with no impairment of recent or remote memory. Mental Status-Normal.  Musculoskeletal Normal Exam - Left-Upper Extremity Strength Normal and Lower Extremity Strength Normal. Normal Exam - Right-Upper Extremity Strength Normal and Lower Extremity Strength Normal.  Lymphatic Head & Neck  General Head & Neck Lymphatics: Bilateral - Description - Normal. Axillary  General Axillary Region: Bilateral - Description - Normal. Tenderness - Non Tender. Femoral & Inguinal  Generalized Femoral & Inguinal Lymphatics: Bilateral - Description - Normal. Tenderness - Non Tender.    Assessment & Plan  GALLSTONES (K80.20) Impression: The patient appears to have symptomatic gallstones. Because of the risk of further painful episodes and possible pancreatitis and think she would benefit from having her gallbladder removed. She would also like to have this done. I have discussed with her in detail the risks and benefits of the operation to remove the gallbladder as well as some of the technical aspects and she understands and wishes to proceed. Current Plans Pt Education - Gallstones: discussed with patient and provided information.

## 2017-02-20 NOTE — Anesthesia Preprocedure Evaluation (Signed)
Anesthesia Evaluation  Patient identified by MRN, date of birth, ID band Patient awake    Reviewed: Allergy & Precautions, NPO status , Patient's Chart, lab work & pertinent test results  Airway Mallampati: II   Neck ROM: Full    Dental no notable dental hx.    Pulmonary asthma , sleep apnea ,    breath sounds clear to auscultation       Cardiovascular hypertension,  Rhythm:Regular Rate:Normal     Neuro/Psych    GI/Hepatic hiatal hernia, GERD  ,  Endo/Other  diabetesHypothyroidism   Renal/GU Renal disease     Musculoskeletal   Abdominal (+) + obese,   Peds  Hematology  (+) anemia ,   Anesthesia Other Findings   Reproductive/Obstetrics                             Anesthesia Physical Anesthesia Plan  ASA: III  Anesthesia Plan: General   Post-op Pain Management:    Induction:   PONV Risk Score and Plan: 4 or greater and Ondansetron, Dexamethasone, Propofol and Midazolam  Airway Management Planned: Oral ETT  Additional Equipment:   Intra-op Plan:   Post-operative Plan: Extubation in OR  Informed Consent: I have reviewed the patients History and Physical, chart, labs and discussed the procedure including the risks, benefits and alternatives for the proposed anesthesia with the patient or authorized representative who has indicated his/her understanding and acceptance.   Dental advisory given  Plan Discussed with: CRNA  Anesthesia Plan Comments:         Anesthesia Quick Evaluation

## 2017-02-20 NOTE — Transfer of Care (Signed)
Immediate Anesthesia Transfer of Care Note  Patient: Kristina Webb  Procedure(s) Performed: Procedure(s): LAPAROSCOPIC CHOLECYSTECTOMY WITH INTRAOPERATIVE CHOLANGIOGRAM (N/A)  Patient Location: PACU  Anesthesia Type:General  Level of Consciousness: awake and alert   Airway & Oxygen Therapy: Patient Spontanous Breathing and Patient connected to nasal cannula oxygen  Post-op Assessment: Report given to RN and Post -op Vital signs reviewed and stable  Post vital signs: Reviewed and stable  Last Vitals:  Vitals:   02/20/17 0829 02/20/17 1125  BP: 139/90 (!) 141/81  Pulse: (!) 57   Resp: 18 10  Temp: 36.6 C 36.5 C    Last Pain:  Vitals:   02/20/17 1125  TempSrc:   PainSc: Asleep      Patients Stated Pain Goal: 5 (88/87/57 9728)  Complications: No apparent anesthesia complications

## 2017-02-20 NOTE — Op Note (Signed)
02/20/2017  11:22 AM  PATIENT:  Kristina Webb  61 y.o. female  PRE-OPERATIVE DIAGNOSIS:  gallstones  POST-OPERATIVE DIAGNOSIS:  gallstones  PROCEDURE:  Procedure(s): LAPAROSCOPIC CHOLECYSTECTOMY WITH INTRAOPERATIVE CHOLANGIOGRAM (N/A)  SURGEON:  Surgeon(s) and Role:    * Jovita Kussmaul, MD - Primary  PHYSICIAN ASSISTANT:   ASSISTANTS: Judyann Munson, RNFA   ANESTHESIA:   local and general  EBL:  No intake/output data recorded.  BLOOD ADMINISTERED:none  DRAINS: none   LOCAL MEDICATIONS USED:  MARCAINE     SPECIMEN:  Source of Specimen:  gallbladder  DISPOSITION OF SPECIMEN:  PATHOLOGY  COUNTS:  YES  TOURNIQUET:  * No tourniquets in log *  DICTATION: .Dragon Dictation   Procedure: After informed consent was obtained the patient was brought to the operating room and placed in the supine position on the operating room table. After adequate induction of general anesthesia the patient's abdomen was prepped with ChloraPrep allowed to dry and draped in usual sterile manner. An appropriate timeout was performed. The area above the umbilicus was infiltrated with quarter percent  Marcaine. A small incision was made with a 15 blade knife. The incision was carried down through the subcutaneous tissue bluntly with a hemostat and [employer] retractors. The linea alba was identified. The linea alba was incised with a 15 blade knife and each side was grasped with Coker clamps. The preperitoneal space was then probed with a hemostat until the peritoneum was opened and access was gained to the abdominal cavity. A 0 Vicryl pursestring stitch was placed in the fascia surrounding the opening. A Hassan cannula was then placed through the opening and anchored in place with the previously placed Vicryl purse string stitch. The abdomen was insufflated with carbon dioxide without difficulty. A laparoscope was inserted through the [facility] cannula in the right upper quadrant was inspected. Next the  epigastric region was infiltrated with % Marcaine. A small incision was made with a 15 blade knife. A 5 mm port was placed bluntly through this incision into the abdominal cavity under direct vision. Next 2 sites were chosen laterally on the right side of the abdomen for placement of 5 mm ports. Each of these areas was infiltrated with quarter percent Marcaine. Small stab incisions were made with a 15 blade knife. 5 mm ports were then placed bluntly through these incisions into the abdominal cavity under direct vision without difficulty. A blunt grasper was placed through the lateralmost 5 mm port and used to grasp the dome of the gallbladder and elevated anteriorly and superiorly. Another blunt grasper was placed through the other 5 mm port and used to retract the body and neck of the gallbladder. A dissector was placed through the epigastric port and using the electrocautery the peritoneal reflection at the gallbladder neck was opened. Blunt dissection was then carried out in this area until the gallbladder neck-cystic duct junction was readily identified and a good window was created. A single clip was placed on the gallbladder neck. A small  ductotomy was made just below the clip with laparoscopic scissors. A 14-gauge Angiocath was then placed through the anterior abdominal wall under direct vision. A Reddick cholangiogram catheter was then placed through the Angiocath and flushed. The catheter was then placed in the cystic duct and anchored in place with a clip. A cholangiogram was obtained that showed no filling defects good emptying into the duodenum an adequate length on the cystic duct. The anchoring clip and catheters were then removed from the patient. 3  clips were placed proximally on the cystic duct and the duct was divided between the 2 sets of clips. Posterior to this the cystic artery was identified and again dissected bluntly in a circumferential manner until a good window  was created. 2 clips  were placed proximally and one distally on the artery and the artery was divided between the 2 sets of clips. Next a laparoscopic hook cautery device was used to separate the gallbladder from the liver bed. Prior to completely detaching the gallbladder from the liver bed the liver bed was inspected and several small bleeding points were coagulated with the electrocautery until the area was completely hemostatic. The gallbladder was then detached the rest of it from the liver bed without difficulty. A laparoscopic bag was inserted through the hassan port. The laparoscope was moved to the epigastric port. The gallbladder was placed within the bag and the bag was sealed.  The bag with the gallbladder was then removed with the [facility] cannula through the infraumbilical port without difficulty. The fascial defect was then closed with the previously placed Vicryl pursestring stitch as well as with another figure-of-eight 0 Vicryl stitch. The liver bed was inspected again and found to be hemostatic. The abdomen was irrigated with copious amounts of saline until the effluent was clear. The ports were then removed under direct vision without difficulty and were found to be hemostatic. The gas was allowed to escape. The skin incisions were all closed with interrupted 4-0 Monocryl subcuticular stitches. Dermabond dressings were applied. The patient tolerated the procedure well. At the end of the case all needle sponge and instrument counts were correct. The patient was then awakened and taken to recovery in stable condition  PLAN OF CARE: Discharge to home after PACU  PATIENT DISPOSITION:  PACU - hemodynamically stable.   Delay start of Pharmacological VTE agent (>24hrs) due to surgical blood loss or risk of bleeding: not applicable

## 2017-02-20 NOTE — Interval H&P Note (Signed)
History and Physical Interval Note:  02/20/2017 9:25 AM  Kristina Webb  has presented today for surgery, with the diagnosis of gallstones  The various methods of treatment have been discussed with the patient and family. After consideration of risks, benefits and other options for treatment, the patient has consented to  Procedure(s): LAPAROSCOPIC CHOLECYSTECTOMY WITH INTRAOPERATIVE CHOLANGIOGRAM (N/A) as a surgical intervention .  The patient's history has been reviewed, patient examined, no change in status, stable for surgery.  I have reviewed the patient's chart and labs.  Questions were answered to the patient's satisfaction.     TOTH III,Joanmarie Tsang S

## 2017-02-21 ENCOUNTER — Encounter (HOSPITAL_COMMUNITY): Payer: Self-pay | Admitting: General Surgery

## 2017-02-21 NOTE — Anesthesia Postprocedure Evaluation (Signed)
Anesthesia Post Note  Patient: Kristina Webb  Procedure(s) Performed: Procedure(s) (LRB): LAPAROSCOPIC CHOLECYSTECTOMY WITH INTRAOPERATIVE CHOLANGIOGRAM (N/A)     Patient location during evaluation: PACU Anesthesia Type: General Level of consciousness: awake and alert Pain management: pain level controlled Vital Signs Assessment: post-procedure vital signs reviewed and stable Respiratory status: spontaneous breathing, nonlabored ventilation, respiratory function stable and patient connected to nasal cannula oxygen Cardiovascular status: blood pressure returned to baseline and stable Postop Assessment: no signs of nausea or vomiting Anesthetic complications: no    Last Vitals:  Vitals:   02/20/17 1225 02/20/17 1245  BP: 120/70 108/83  Pulse: (!) 50 67  Resp: 10 10  Temp: 36.7 C     Last Pain:  Vitals:   02/20/17 1245  TempSrc:   PainSc: 0-No pain                 [name],[name]

## 2017-03-05 ENCOUNTER — Telehealth: Payer: Self-pay | Admitting: Allergy & Immunology

## 2017-03-05 NOTE — Telephone Encounter (Signed)
Left message to call me back - kt °

## 2017-03-05 NOTE — Telephone Encounter (Signed)
Received letter from you that she would like to talk with you about. Please call back. Thanks

## 2017-03-07 NOTE — Telephone Encounter (Signed)
Left another message to call me back.  Advised her that I will be out of the office next week - kt

## 2017-03-10 ENCOUNTER — Telehealth: Payer: Self-pay | Admitting: Physician Assistant

## 2017-03-10 ENCOUNTER — Telehealth: Payer: Self-pay

## 2017-03-10 ENCOUNTER — Encounter: Payer: Self-pay | Admitting: Physician Assistant

## 2017-03-10 ENCOUNTER — Other Ambulatory Visit: Payer: Self-pay | Admitting: Physician Assistant

## 2017-03-10 MED ORDER — PREDNISONE 20 MG PO TABS: ORAL_TABLET | ORAL | 0 refills | Status: DC

## 2017-03-10 MED ORDER — TRIAMCINOLONE ACETONIDE 0.1 % EX OINT: 1.0000 "application " | TOPICAL_OINTMENT | Freq: Two times a day (BID) | CUTANEOUS | 1 refills | Status: DC

## 2017-03-10 NOTE — Telephone Encounter (Signed)
Patient calling with dermatitis on leg from [place]. Will send in prednisone, if not better all the office of an appointment

## 2017-03-10 NOTE — Telephone Encounter (Signed)
Pt reports prednisone causing her to gain wt. Can she take smaller dose? Pt would also like a cream called into pharmacy for a rash on her face.  Pt called to inform of instructions reported by the provider:  Sent in triamcinolone ointment but use sparingly on face.  Read directions for prednisone, YOU DO NOT HAVE TO FINISH IT, TAKE 1-3 DAYS AND THEN STOP IT IF YOU ARE BETTER    Please take the prednisone as directed below, this is NOT an antibiotic so you do NOT have to finish it. You can take it for a few days and stop it if you are doing better.   Please take the prednisone to help decrease inflammation and therefore decrease symptoms. Take it it with food to avoid GI upset. It can cause increased energy but on the other hand it can make it hard to sleep at night so please take it AT [place], it takes 8-12 hours to start working so it will NOT affect your sleeping if you take it at night with your food!! If you are diabetic it will increase your sugars so decrease carbs and monitor your sugars closely.

## 2017-03-24 ENCOUNTER — Ambulatory Visit (INDEPENDENT_AMBULATORY_CARE_PROVIDER_SITE_OTHER): Payer: 59 | Admitting: Physician Assistant

## 2017-03-24 ENCOUNTER — Encounter: Payer: Self-pay | Admitting: Physician Assistant

## 2017-03-24 VITALS — BP 122/76 | HR 64 | Temp 97.7°F | Resp 16 | Ht 62.5 in | Wt 194.2 lb

## 2017-03-24 DIAGNOSIS — K2100 Gastro-esophageal reflux disease with esophagitis, without bleeding: Secondary | ICD-10-CM

## 2017-03-24 DIAGNOSIS — E119 Type 2 diabetes mellitus without complications: Secondary | ICD-10-CM | POA: Diagnosis not present

## 2017-03-24 DIAGNOSIS — N182 Chronic kidney disease, stage 2 (mild): Secondary | ICD-10-CM

## 2017-03-24 DIAGNOSIS — E785 Hyperlipidemia, unspecified: Secondary | ICD-10-CM

## 2017-03-24 DIAGNOSIS — Z79899 Other long term (current) drug therapy: Secondary | ICD-10-CM | POA: Diagnosis not present

## 2017-03-24 DIAGNOSIS — I10 Essential (primary) hypertension: Secondary | ICD-10-CM

## 2017-03-24 LAB — CBC WITH DIFFERENTIAL/PLATELET
Basophils Absolute: 0 cells/uL (ref 0–200)
Basophils Relative: 0 %
Eosinophils Absolute: 321 cells/uL (ref 15–500)
Eosinophils Relative: 3 %
HCT: 39 % (ref 35.0–45.0)
Hemoglobin: 12.5 g/dL (ref 11.7–15.5)
Lymphocytes Relative: 36 %
Lymphs Abs: 3852 cells/uL (ref 850–3900)
MCH: 26.6 pg — ABNORMAL LOW (ref 27.0–33.0)
MCHC: 32.1 g/dL (ref 32.0–36.0)
MCV: 83 fL (ref 80.0–100.0)
MPV: 9.2 fL (ref 7.5–12.5)
Monocytes Absolute: 749 cells/uL (ref 200–950)
Monocytes Relative: 7 %
Neutro Abs: 5778 cells/uL (ref 1500–7800)
Neutrophils Relative %: 54 %
Platelets: 290 10*3/uL (ref 140–400)
RBC: 4.7 MIL/uL (ref 3.80–5.10)
RDW: 15.3 % — ABNORMAL HIGH (ref 11.0–15.0)
WBC: 10.7 10*3/uL (ref 3.8–10.8)

## 2017-03-24 NOTE — Patient Instructions (Addendum)
Drink 80-100 oz a day of water, measure it out Goal is 80 oz right now, or 4 of your water bottles, this will help you with constipation, muscle aches, fatigue, joint pain, hair, skin and nails.   Eat 3 meals a day, have to do breakfast, eat protein- hard boiled eggs, protein bar like [place] protein bar, greek yogurt like oikos triple zero, chobani 100, or light n fit greek  Stop the prednisone This can cause the anxiety and the stomach pain you talk about Start on protonix once a day in the morning 30 mins before food  Can take the zantac once or twice a day Avoid NSAIDS like aleve, ibuprofen Avoid alcohol   Food Choices for Gastroesophageal Reflux Disease, Adult When you have gastroesophageal reflux disease (GERD), the foods you eat and your eating habits are very important. Choosing the right foods can help ease your discomfort. What guidelines do I need to follow?  Choose fruits, vegetables, whole grains, and low-fat dairy products.  Choose low-fat meat, fish, and poultry.  Limit fats such as oils, salad dressings, butter, nuts, and avocado.  Keep a food diary. This helps you identify foods that cause symptoms.  Avoid foods that cause symptoms. These may be different for everyone.  Eat small meals often instead of 3 large meals a day.  Eat your meals slowly, in a place where you are relaxed.  Limit fried foods.  Cook foods using methods other than frying.  Avoid drinking alcohol.  Avoid drinking large amounts of liquids with your meals.  Avoid bending over or lying down until 2-3 hours after eating. What foods are not recommended? These are some foods and drinks that may make your symptoms worse: Vegetables Tomatoes. Tomato juice. Tomato and spaghetti sauce. Chili peppers. Onion and garlic. Horseradish. Fruits Oranges, grapefruit, and lemon (fruit and juice). Meats High-fat meats, fish, and poultry. This includes hot dogs, ribs, ham, sausage, salami, and  bacon. Dairy Whole milk and chocolate milk. Sour cream. Cream. Butter. Ice cream. Cream cheese. Drinks Coffee and tea. Bubbly (carbonated) drinks or energy drinks. Condiments Hot sauce. Barbecue sauce. Sweets/Desserts Chocolate and cocoa. Donuts. Peppermint and spearmint. Fats and Oils High-fat foods. This includes [place] fries and potato chips. Other Vinegar. Strong spices. This includes black pepper, white pepper, red pepper, cayenne, curry powder, cloves, ginger, and chili powder. The items listed above may not be a complete list of foods and drinks to avoid. Contact your dietitian for more information. This information is not intended to replace advice given to you by your health care provider. Make sure you discuss any questions you have with your health care provider. Document Released: 02/11/2012 Document Revised: 01/18/2016 Document Reviewed: 06/16/2013 Elsevier Interactive Patient Education  2017 [employer].

## 2017-03-24 NOTE — Progress Notes (Signed)
Assessment and Plan:   Essential hypertension - continue medications, DASH diet, exercise and monitor at home. Call if greater than 130/80.  - CBC with Differential/Platelet - Hepatic function panel - BASIC METABOLIC PANEL WITH GFR - TSH  Type 2 diabetes mellitus without complication, without long-term current use of insulin (HCC) - Hemoglobin A1c Discussed general issues about diabetes pathophysiology and management., Educational material distributed., Suggested low cholesterol diet., Encouraged aerobic exercise., Discussed foot care., Reminded to get yearly retinal exam.  CKD (chronic kidney disease) stage 2, GFR 60-89 ml/min Increase fluids, avoid NSAIDS, monitor sugars, will monitor - BASIC METABOLIC PANEL WITH GFR  Hyperlipemia -continue medications, check lipids, decrease fatty foods, increase activity.  - Lipid panel   Morbid obesity, unspecified obesity type ([place]) Obesity with co morbidities- long discussion about weight loss, diet, and exercise   Medication management - Magnesium  Gastroesophageal reflux disease with esophagitis -     Amylase - stop prednisone - add protonix and zantac - benign AB, if any worsening symptoms go to ER    Continue diet and meds as discussed. Further disposition pending results of labs. Future Appointments Date Time Provider [place]  03/24/2017 4:30 PM Vicie Mutters, [place] [facility] None  07/02/2017 4:15 PM Vicie Mutters, PA-C [facility] None  09/16/2017 3:00 PM Vicie Mutters, PA-C [facility] None    HPI 61 y.o. female  presents for 3 month follow up with hypertension, hyperlipidemia, prediabetes and vitamin D. S/p choley on 06/28, started on prednisone for poison ivy on 07/16, she states she has been feeling anxious x Friday, she vomited x 2 yesterday, no fever, chills, no SOB, CP. She is very constipation, last BM this AM but straining and hard small balls. She has been having some epigastric pain and RUQ pain, burning  pain, has improved, has had a lot of gas.    Her blood pressure has been controlled at home, today their BP is BP: 122/76  She does workout, walks and wears her fitbit, tries to walk 2 miles a day.  She denies chest pain, shortness of breath, dizziness.   She is not on cholesterol medication and denies myalgias. Her cholesterol is not at goal. The cholesterol last visit was:   Lab Results  Component Value Date   CHOL 193 12/19/2016   HDL 53 12/19/2016   LDLCALC 109 (H) 12/19/2016   TRIG 153 (H) 12/19/2016   CHOLHDL 3.6 12/19/2016    She has been working on diet and exercise for prediabetes, and denies paresthesia of the feet, polydipsia, polyuria and visual disturbances. Last A1C in the office was:  Lab Results  Component Value Date   HGBA1C 5.6 02/12/2017  Patient is on Vitamin D supplement.   Lab Results  Component Value Date   VD25OH 27 (L) 09/10/2016    BMI is Body mass index is 34.95 kg/m., she is working on diet and exercise. She has been following with Dr. Brett Fairy for OSA/migraines.   Wt Readings from Last 3 Encounters:  03/24/17 194 lb 3.2 oz (88.1 kg)  02/20/17 192 lb 12.8 oz (87.5 kg)  02/12/17 192 lb 12.8 oz (87.5 kg)   She is on thyroid medication. Her medication was not changed last visit.   Lab Results  Component Value Date   TSH 2.87 12/19/2016  She has history of lumbar back surgery over 17 years ago with fusion L4-L5, has had injection with Dr. Vira Blanco.    Current Medications:  Current Outpatient Prescriptions on File Prior to Visit  Medication  Sig Dispense Refill  . acetaminophen (TYLENOL) 500 MG tablet Take 1,000 mg by mouth every 6 (six) hours as needed for mild pain, moderate pain or headache.    . albuterol (PROAIR HFA) 108 (90 Base) MCG/ACT inhaler Inhale 2 puffs into the lungs every 6 (six) hours as needed for wheezing. 3 Inhaler 3  . amitriptyline (ELAVIL) 10 MG tablet TAKE 1 TO 2 TABLETS BY MOUTH DAILY AT BEDTIME (Patient taking differently: TAKE 0.5 TO  1 TABLET BY MOUTH DAILY AT BEDTIME as needed for sleep) 180 tablet 1  . benzonatate (TESSALON) 100 MG capsule Take 100 mg by mouth 2 (two) times daily as needed for cough.     . chlorpheniramine (CHLOR-TRIMETON) 4 MG tablet Take 4 mg by mouth every 6 (six) hours as needed for allergies.    . Cholecalciferol (VITAMIN D3) 10000 units TABS Take 10,000 Units by mouth daily.    . Cyanocobalamin (VITAMIN B 12 PO) Take 1 tablet by mouth daily.    . cyclobenzaprine (FLEXERIL) 10 MG tablet Take 1/2 to 1 tablet 2 to 3 x day if needed for muscle spasm (Patient taking differently: Take 20 mg by mouth at bedtime as needed for muscle spasms. ) 90 tablet 1  . ESTRACE VAGINAL 0.1 MG/GM vaginal cream Place 1 Applicatorful vaginally 2 (two) times a week. (Patient taking differently: Place 1 Applicatorful vaginally daily as needed. ) 127.5 g 1  . fluticasone furoate-vilanterol (BREO ELLIPTA) 200-25 MCG/INH AEPB Inhale 1 puff into the lungs daily. (Patient taking differently: Inhale 1 puff into the lungs daily as needed. ) 60 each 3  . furosemide (LASIX) 40 MG tablet TAKE 1 TABLET EVERY MORNING FOR BLOOD PRESSURE AND FLUID (Patient taking differently: Take 40 mg by mouth daily as needed. ) 90 tablet 1  . HYDROcodone-acetaminophen (NORCO/VICODIN) 5-325 MG tablet Take 1-2 tablets by mouth every 4 (four) hours as needed for moderate pain or severe pain. 15 tablet 0  . levothyroxine (SYNTHROID, LEVOTHROID) 75 MCG tablet Take 1 tablet (75 mcg total) by mouth daily. (Patient taking differently: Take 75 mcg by mouth daily before breakfast. ) 90 tablet 3  . losartan (COZAAR) 100 MG tablet TAKE 1 TABLET DAILY 90 tablet 1  . metFORMIN (GLUCOPHAGE-XR) 500 MG 24 hr tablet Take 1 tablet by mouth 2 times daily with meals (Patient taking differently: Take 500 mg by mouth daily with breakfast. ) 180 tablet 1  . montelukast (SINGULAIR) 10 MG tablet Take 1 tablet (10 mg total) by mouth daily. 90 tablet 3  . Multiple Vitamins-Minerals  (MULTIVITAMIN WITH MINERALS) tablet Take 1 tablet by mouth daily.    . NUCYNTA 50 MG TABS tablet TAKE 1 TABLET (50 mg) four times a day as needed for severe headache  0  . pantoprazole (PROTONIX) 40 MG tablet Take 1 tablet (40 mg total) by mouth daily. (Patient taking differently: Take 40 mg by mouth every other day. Alternates with ranitidine) 90 tablet 1  . Propylene Glycol (SYSTANE BALANCE OP) Place 1-2 drops into both eyes 2 (two) times daily as needed.    . ranitidine (ZANTAC) 300 MG tablet TAKE TWICE A DAY WHILE TRYING TO GET OFF PPI (Patient taking differently: Take 300 mg by mouth every other day. Alternates with Pantoprazole) 180 tablet 3  . sertraline (ZOLOFT) 100 MG tablet Take 1-2 tablets daily for anxiety (Patient taking differently: Take 100 mg by mouth daily. ) 180 tablet 3  . SUMAtriptan (IMITREX) 100 MG tablet Take 1 tablet (100 mg total)  by mouth once as needed for migraine. May repeat in 2 hours if headache persists or recurs. 10 tablet 2  . triamcinolone ointment (KENALOG) 0.1 % Apply 1 application topically 2 (two) times daily. 80 g 1   No current facility-administered medications on file prior to visit.    Medical History:  Past Medical History:  Diagnosis Date  . Anemia    hx  . Anxiety   . Arthritis    back  . Asthma   . Depression   . Deviated septum   . GERD (gastroesophageal reflux disease)   . Headache   . History of hiatal hernia   . Hyperlipemia   . Hypertension   . Hypothyroidism   . OSA (obstructive sleep apnea)    does not use -last 6 months  . Pre-diabetes    per pt   Allergies:  Allergies  Allergen Reactions  . Levaquin [Levofloxacin In D5w] Other (See Comments)    Ankles hurt really bad ? ACHILLES TENDON ?  Marland Kitchen Doxycycline Other (See Comments)    UNSPECIFIED REACTION     Review of Systems:  Review of Systems  Constitutional: Positive for malaise/fatigue. Negative for chills, diaphoresis, fever and weight loss.  HENT: Negative for  congestion, ear discharge, ear pain, hearing loss, nosebleeds, sore throat and tinnitus.   Eyes: Negative.   Respiratory: Negative.  Negative for stridor.   Cardiovascular: Negative.   Gastrointestinal: Positive for constipation. Negative for abdominal pain, blood in stool, diarrhea, heartburn, melena, nausea and vomiting.  Genitourinary: Negative.   Musculoskeletal: Positive for back pain. Negative for falls, joint pain, myalgias and neck pain.  Skin: Negative.   Neurological: Positive for tingling (right leg) and headaches. Negative for dizziness, tremors, sensory change, speech change, focal weakness, seizures, loss of consciousness and weakness.  Psychiatric/Behavioral: Negative for depression, hallucinations, memory loss, substance abuse and suicidal ideas. The patient is nervous/anxious. The patient does not have insomnia.     Family history- Review and unchanged Social history- Review and unchanged Physical Exam: BP 122/76   Pulse 64   Temp 97.7 F (36.5 C)   Resp 16   Ht 5' 2.5" (1.588 m)   Wt 194 lb 3.2 oz (88.1 kg)   SpO2 97%   BMI 34.95 kg/m  Wt Readings from Last 3 Encounters:  03/24/17 194 lb 3.2 oz (88.1 kg)  02/20/17 192 lb 12.8 oz (87.5 kg)  02/12/17 192 lb 12.8 oz (87.5 kg)   General Appearance: Well nourished, in no apparent distress. Eyes: PERRLA, EOMs, conjunctiva no swelling or erythema Sinuses: No Frontal/maxillary tenderness ENT/Mouth: Ext aud canals clear, TMs without erythema, bulging. No erythema, swelling, or exudate on post pharynx.  Tonsils not swollen or erythematous. Hearing normal.  Neck: Supple, thyroid normal.  Respiratory: Respiratory effort normal, BS equal bilaterally without rales, rhonchi, wheezing or stridor.  Cardio: RRR with no MRGs. Brisk peripheral pulses without edema.  Abdomen: Soft, + BS,  mild epigastric tenderness, no guarding, rebound, hernias, masses. Lymphatics: Non tender without lymphadenopathy.  Musculoskeletal: Full ROM,  5/5 strength, Normal gait,  Skin: Port wine stain right face. Warm, dry without rashes, lesions, ecchymosis.  Neuro: Cranial nerves intact. Normal muscle tone, no cerebellar symptoms. Psych: Awake and oriented X 3, normal affect, Insight and Judgment appropriate.    Vicie Mutters, PA-C 4:19 PM [facility]

## 2017-03-25 ENCOUNTER — Other Ambulatory Visit: Payer: Self-pay | Admitting: Physician Assistant

## 2017-03-25 DIAGNOSIS — E039 Hypothyroidism, unspecified: Secondary | ICD-10-CM

## 2017-03-25 LAB — BASIC METABOLIC PANEL WITH GFR
BUN: 19 mg/dL (ref 7–25)
CO2: 25 mmol/L (ref 20–31)
Calcium: 8.9 mg/dL (ref 8.6–10.4)
Chloride: 102 mmol/L (ref 98–110)
Creat: 1.03 mg/dL (ref 0.50–1.05)
GFR, Est African American: 69 mL/min (ref >=60)
GFR, Est Non African American: 60 mL/min (ref >=60)
Glucose, Bld: 77 mg/dL (ref 65–99)
Potassium: 4.4 mmol/L (ref 3.5–5.3)
Sodium: 137 mmol/L (ref 135–146)

## 2017-03-25 LAB — TSH: TSH: 4.62 mIU/L — ABNORMAL HIGH

## 2017-03-25 LAB — LIPID PANEL
Cholesterol: 217 mg/dL — ABNORMAL HIGH (ref <200)
HDL: 63 mg/dL (ref >50)
LDL Cholesterol: 129 mg/dL — ABNORMAL HIGH (ref <100)
Total CHOL/HDL Ratio: 3.4 Ratio (ref <5.0)
Triglycerides: 123 mg/dL (ref <150)
VLDL: 25 mg/dL (ref <30)

## 2017-03-25 LAB — AMYLASE: Amylase: 39 U/L (ref 21–101)

## 2017-03-25 LAB — HEPATIC FUNCTION PANEL
ALT: 17 U/L (ref 6–29)
AST: 17 U/L (ref 10–35)
Albumin: 4 g/dL (ref 3.6–5.1)
Alkaline Phosphatase: 75 U/L (ref 33–130)
Bilirubin, Direct: 0.1 mg/dL (ref <=0.2)
Indirect Bilirubin: 0.3 mg/dL (ref 0.2–1.2)
Total Bilirubin: 0.4 mg/dL (ref 0.2–1.2)
Total Protein: 6.5 g/dL (ref 6.1–8.1)

## 2017-03-25 LAB — HEMOGLOBIN A1C
Hgb A1c MFr Bld: 5.8 % — ABNORMAL HIGH (ref <5.7)
Mean Plasma Glucose: 120 mg/dL

## 2017-03-25 LAB — MAGNESIUM: Magnesium: 2 mg/dL (ref 1.5–2.5)

## 2017-04-02 ENCOUNTER — Encounter (HOSPITAL_COMMUNITY): Payer: Self-pay | Admitting: *Deleted

## 2017-04-02 ENCOUNTER — Emergency Department (HOSPITAL_COMMUNITY): Payer: 59

## 2017-04-02 ENCOUNTER — Emergency Department (HOSPITAL_COMMUNITY)
Admission: EM | Admit: 2017-04-02 | Discharge: 2017-04-02 | Disposition: A | Discharge status: Home / Self Care | Payer: 59 | Attending: Emergency Medicine | Admitting: Emergency Medicine

## 2017-04-02 DIAGNOSIS — R1013 Epigastric pain: Secondary | ICD-10-CM

## 2017-04-02 DIAGNOSIS — M62838 Other muscle spasm: Secondary | ICD-10-CM | POA: Diagnosis not present

## 2017-04-02 DIAGNOSIS — J45909 Unspecified asthma, uncomplicated: Secondary | ICD-10-CM | POA: Diagnosis not present

## 2017-04-02 DIAGNOSIS — I129 Hypertensive chronic kidney disease with stage 1 through stage 4 chronic kidney disease, or unspecified chronic kidney disease: Secondary | ICD-10-CM | POA: Diagnosis not present

## 2017-04-02 DIAGNOSIS — R55 Syncope and collapse: Secondary | ICD-10-CM | POA: Diagnosis not present

## 2017-04-02 DIAGNOSIS — E1122 Type 2 diabetes mellitus with diabetic chronic kidney disease: Secondary | ICD-10-CM | POA: Insufficient documentation

## 2017-04-02 DIAGNOSIS — Z7984 Long term (current) use of oral hypoglycemic drugs: Secondary | ICD-10-CM | POA: Diagnosis not present

## 2017-04-02 DIAGNOSIS — M542 Cervicalgia: Secondary | ICD-10-CM | POA: Diagnosis not present

## 2017-04-02 DIAGNOSIS — M545 Low back pain: Secondary | ICD-10-CM | POA: Insufficient documentation

## 2017-04-02 DIAGNOSIS — R112 Nausea with vomiting, unspecified: Secondary | ICD-10-CM

## 2017-04-02 DIAGNOSIS — Z79899 Other long term (current) drug therapy: Secondary | ICD-10-CM | POA: Insufficient documentation

## 2017-04-02 DIAGNOSIS — R109 Unspecified abdominal pain: Secondary | ICD-10-CM | POA: Diagnosis not present

## 2017-04-02 DIAGNOSIS — N182 Chronic kidney disease, stage 2 (mild): Secondary | ICD-10-CM | POA: Insufficient documentation

## 2017-04-02 DIAGNOSIS — R079 Chest pain, unspecified: Secondary | ICD-10-CM | POA: Diagnosis not present

## 2017-04-02 LAB — BASIC METABOLIC PANEL
Anion gap: 10 (ref 5–15)
BUN: 13 mg/dL (ref 6–20)
CO2: 21 mmol/L — ABNORMAL LOW (ref 22–32)
Calcium: 8.6 mg/dL — ABNORMAL LOW (ref 8.9–10.3)
Chloride: 106 mmol/L (ref 101–111)
Creatinine, Ser: 0.83 mg/dL (ref 0.44–1.00)
GFR calc Af Amer: >60 mL/min (ref >60)
GFR calc non Af Amer: >60 mL/min (ref >60)
Glucose, Bld: 112 mg/dL — ABNORMAL HIGH (ref 65–99)
Potassium: 4 mmol/L (ref 3.5–5.1)
Sodium: 137 mmol/L (ref 135–145)

## 2017-04-02 LAB — URINALYSIS, ROUTINE W REFLEX MICROSCOPIC
Bilirubin Urine: NEGATIVE
Glucose, UA: NEGATIVE mg/dL
Hgb urine dipstick: NEGATIVE
Ketones, ur: NEGATIVE mg/dL
Leukocytes, UA: NEGATIVE
Nitrite: NEGATIVE
Protein, ur: NEGATIVE mg/dL
Specific Gravity, Urine: 1.008 (ref 1.005–1.030)
pH: 7 (ref 5.0–8.0)

## 2017-04-02 LAB — I-STAT TROPONIN, ED
Troponin i, poc: 0 ng/mL (ref 0.00–0.08)
Troponin i, poc: 0 ng/mL (ref 0.00–0.08)

## 2017-04-02 LAB — CBC
HCT: 36.9 % (ref 36.0–46.0)
Hemoglobin: 11.8 g/dL — ABNORMAL LOW (ref 12.0–15.0)
MCH: 26.4 pg (ref 26.0–34.0)
MCHC: 32 g/dL (ref 30.0–36.0)
MCV: 82.6 fL (ref 78.0–100.0)
Platelets: 188 10*3/uL (ref 150–400)
RBC: 4.47 MIL/uL (ref 3.87–5.11)
RDW: 14.7 % (ref 11.5–15.5)
WBC: 12 10*3/uL — ABNORMAL HIGH (ref 4.0–10.5)

## 2017-04-02 MED ORDER — GI COCKTAIL ~~LOC~~
30.0000 mL | Freq: Once | ORAL | Status: AC
  Administered 2017-04-02: 30 mL via ORAL
  Filled 2017-04-02: qty 30

## 2017-04-02 MED ORDER — DICYCLOMINE HCL 20 MG PO TABS: 20.0000 mg | ORAL_TABLET | Freq: Two times a day (BID) | ORAL | 0 refills | Status: DC

## 2017-04-02 MED ORDER — ONDANSETRON HCL 4 MG PO TABS: 4.0000 mg | ORAL_TABLET | Freq: Three times a day (TID) | ORAL | 0 refills | Status: DC | PRN

## 2017-04-02 MED ORDER — IOPAMIDOL (ISOVUE-300) INJECTION 61%
INTRAVENOUS | Status: AC
  Administered 2017-04-02: 100 mL
  Filled 2017-04-02: qty 100

## 2017-04-02 NOTE — ED Notes (Signed)
Patient transported to CT 

## 2017-04-02 NOTE — Discharge Instructions (Addendum)

## 2017-04-02 NOTE — ED Provider Notes (Signed)
Patient received at sign out from [facility]. Per her history  "61yo female with a past medical history of hiatal hernia OSA, diabetes, hypertension, GERD, CKD stage II, presents emergency department today following a near syncopal episode and with complaints of spasmodic neck and lower back pain beginning this afternoon. Pt states that this afternoon while sitting outside she had an acute onset of upper abdominal pain. She attempted to press on her abdomen at that time, but she states that she became suddenly nauseous upon standing she became near syncopal. She reports that she only felt lightheaded at that time and did not fully lose consciousness. Shortly following this, she had an acute onset of spasmodic type neck pain and lower back pain. She states her shots of pain overall improved since onset, but not completely resolved. Additionally, she states that she passed one bowel movement with maroon-colored stool morning. She does have prior surgical history including cholecystectomy and partial colectomy due to endometriosis. Of note, she does have issues with constipation Due to her prior colectomy for which she takes stool softeners and MiraLAX daily, which helps relieve this. No sick contacts with similar symptoms. Denies chest pain, fever, or any associated symptoms."  Physical Exam  BP 116/69 (BP Location: Right Arm)   Pulse 69   Temp 98.4 F (36.9 C) (Oral)   Resp 16   Ht 5' 2.5" (1.588 m)   Wt 88 kg (194 lb)   SpO2 96%   BMI 34.92 kg/m   Physical Exam The patient appears comfortable. No scleral icterus. Mild epigastric and RUQ TTP without rebound or guarding.    ED Course  Procedures  MDM 61 year old female with a h/o of cholecystectomy on 6/28 presents to the Emergency Department with epigastric and RUQ abdominal pain, onset this afternoon. The patient was signed out from [place] with a CT A/P pending. CT demonstrating mild post-operative soft tissue inflammation of the gallbladder  fossa without bile leak. The patient is afebrile with no tachycardic. On exam, she is comfortable and in no current distress.   Consulted general surgery and spoke with Dr. Hulen Skains regarding the soft tissue findings on CT who recommended the patient could follow up in the surgical clinic. The patient still complained of mild epigastric pain and was given a GI cocktail with improvement of her sx. Will d/c the patient to home with follow up to Dr. Marlou Starks and Rx for bently and ondansetron. Discussed the plan with the patient who acknowledges the plan and is agreeable at this time. VSS. NAD. Strict return precautions given. The patient is safe for d/c at this time.     Joline Maxcy A, PA-C 04/03/17 [id number]    Orlie Dakin, MD 04/03/17 [id number]

## 2017-04-02 NOTE — ED Provider Notes (Signed)
[facility] DEPT Provider Note   CSN: [id number] Arrival date & time: 04/02/17  1121  By signing my name below, I, Reola Mosher, attest that this documentation has been prepared under the direction and in the presence of Margarita Mail, PA-C.  Electronically Signed: Reola Mosher, ED Scribe. 04/02/17. 7:00 PM.  History   Chief Complaint Chief Complaint  Patient presents with  . Neck Pain  . Back Pain   The history is provided by the patient. No language interpreter was used.   61yo female with a past medical history of hiatal hernia OSA, diabetes, hypertension, GERD, CKD stage II, presents emergency department today following a near syncopal episode and with complaints of spasmodic neck and lower back pain beginning this afternoon. Pt states that this afternoon while sitting outside she had an acute onset of upper abdominal pain. She attempted to press on her abdomen at that time, but she states that she became suddenly nauseous upon standing she became near syncopal. She reports that she only felt lightheaded at that time and did not fully lose consciousness. Shortly following this, she had an acute onset of spasmodic type neck pain and lower back pain. She states her shots of pain overall improved since onset, but not completely resolved. Additionally, she states that she passed one bowel movement with maroon-colored stool morning. She does have prior surgical history including cholecystectomy and partial colectomy due to endometriosis. Of note, she does have issues with constipation Due to her prior colectomy for which she takes stool softeners and MiraLAX daily, which helps relieve this. No sick contacts with similar symptoms. Denies chest pain, fever, or any associated symptoms.  Past Medical History:  Diagnosis Date  . Anemia    hx  . Anxiety   . Arthritis    back  . Asthma   . Depression   . Deviated septum   . GERD (gastroesophageal reflux disease)   . Headache    . History of hiatal hernia   . Hyperlipemia   . Hypertension   . Hypothyroidism   . OSA (obstructive sleep apnea)    does not use -last 6 months  . Pre-diabetes    per pt    Patient Active Problem List   Diagnosis Date Noted  . Lower back pain 07/17/2015  . 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 Obesity, morbid ([place]) 03/29/2015  . CKD (chronic kidney disease) stage 2, GFR 60-89 ml/min 04/15/2014  . Hyperlipemia   . Diabetes ([place])   . Anemia   . Asthma   . Depression   . OSA (obstructive sleep apnea)   . Hypertension   . GERD (gastroesophageal reflux disease)   . Cough 12/28/2012    Past Surgical History:  Procedure Laterality Date  . ANKLE GANGLION CYST EXCISION Left   . BACK SURGERY    . birth mark treatments    . BREAST LUMPECTOMY WITH RADIOACTIVE SEED LOCALIZATION Left 02/02/2016   Procedure: LEFT BREAST LUMPECTOMY WITH RADIOACTIVE SEED LOCALIZATION;  Surgeon: Autumn Messing III, MD;  Location: [facility];  Service: General;  Laterality: Left;  . BREAST SURGERY Right    papilloma removed,cyst lft  . CHOLECYSTECTOMY N/A 02/20/2017   Procedure: LAPAROSCOPIC CHOLECYSTECTOMY WITH INTRAOPERATIVE CHOLANGIOGRAM;  Surgeon: Jovita Kussmaul, MD;  Location: [facility];  Service: General;  Laterality: N/A;  . COLON SURGERY  2008   resection- endometriosis    OB History    No data available       Home Medications  Prior to Admission medications   Medication Sig Start Date End Date Taking? Authorizing Provider  acetaminophen (TYLENOL) 500 MG tablet Take 1,000 mg by mouth every 6 (six) hours as needed for mild pain, moderate pain or headache.    [provider]  albuterol (PROAIR HFA) 108 (90 Base) MCG/ACT inhaler Inhale 2 puffs into the lungs every 6 (six) hours as needed for wheezing. 09/10/16   Vicie Mutters, PA-C  amitriptyline (ELAVIL) 10 MG tablet TAKE 1 TO 2 TABLETS BY MOUTH DAILY AT BEDTIME Patient taking differently: TAKE 0.5 TO 1 TABLET BY MOUTH DAILY AT BEDTIME as  needed for sleep 11/16/15   Unk Pinto, MD  benzonatate (TESSALON) 100 MG capsule Take 100 mg by mouth 2 (two) times daily as needed for cough.  09/04/16   [provider]  chlorpheniramine (CHLOR-TRIMETON) 4 MG tablet Take 4 mg by mouth every 6 (six) hours as needed for allergies.    [provider]  Cholecalciferol (VITAMIN D3) 10000 units TABS Take 10,000 Units by mouth daily.    [provider]  Cyanocobalamin (VITAMIN B 12 PO) Take 1 tablet by mouth daily.    [provider]  cyclobenzaprine (FLEXERIL) 10 MG tablet Take 1/2 to 1 tablet 2 to 3 x day if needed for muscle spasm Patient taking differently: Take 20 mg by mouth at bedtime as needed for muscle spasms.  07/30/15   Unk Pinto, MD  ESTRACE VAGINAL 0.1 MG/GM vaginal cream Place 1 Applicatorful vaginally 2 (two) times a week. Patient taking differently: Place 1 Applicatorful vaginally daily as needed.  08/29/16   Unk Pinto, MD  fluticasone furoate-vilanterol (BREO ELLIPTA) 200-25 MCG/INH AEPB Inhale 1 puff into the lungs daily. Patient taking differently: Inhale 1 puff into the lungs daily as needed.  11/04/16   Valentina Shaggy, MD  furosemide (LASIX) 40 MG tablet TAKE 1 TABLET EVERY MORNING FOR BLOOD PRESSURE AND FLUID Patient taking differently: Take 40 mg by mouth daily as needed.  08/29/16   Unk Pinto, MD  HYDROcodone-acetaminophen (NORCO/VICODIN) 5-325 MG tablet Take 1-2 tablets by mouth every 4 (four) hours as needed for moderate pain or severe pain. 02/20/17   Jovita Kussmaul, MD  levothyroxine (SYNTHROID, LEVOTHROID) 75 MCG tablet Take 1 tablet (75 mcg total) by mouth daily. Patient taking differently: Take 75 mcg by mouth daily before breakfast.  09/03/16   Unk Pinto, MD  losartan (COZAAR) 100 MG tablet TAKE 1 TABLET DAILY 02/07/17   Unk Pinto, MD  metFORMIN (GLUCOPHAGE-XR) 500 MG 24 hr tablet Take 1 tablet by mouth 2 times daily with meals Patient taking  differently: Take 500 mg by mouth daily with breakfast.  09/13/16   Vicie Mutters, PA-C  montelukast (SINGULAIR) 10 MG tablet Take 1 tablet (10 mg total) by mouth daily. 09/03/16   Unk Pinto, MD  Multiple Vitamins-Minerals (MULTIVITAMIN WITH MINERALS) tablet Take 1 tablet by mouth daily.    [provider]  NUCYNTA 50 MG TABS tablet TAKE 1 TABLET (50 mg) four times a day as needed for severe headache 02/10/15   [provider]  pantoprazole (PROTONIX) 40 MG tablet Take 1 tablet (40 mg total) by mouth daily. Patient taking differently: Take 40 mg by mouth every other day. Alternates with ranitidine 08/29/16   Unk Pinto, MD  Propylene Glycol (SYSTANE BALANCE OP) Place 1-2 drops into both eyes 2 (two) times daily as needed.    [provider]  ranitidine (ZANTAC) 300 MG tablet TAKE TWICE A DAY WHILE  TRYING TO GET OFF PPI Patient taking differently: Take 300 mg by mouth every other day. Alternates with Pantoprazole 09/03/16   Unk Pinto, MD  sertraline (ZOLOFT) 100 MG tablet Take 1-2 tablets daily for anxiety Patient taking differently: Take 100 mg by mouth daily.  08/29/16   Vicie Mutters, PA-C  SUMAtriptan (IMITREX) 100 MG tablet Take 1 tablet (100 mg total) by mouth once as needed for migraine. May repeat in 2 hours if headache persists or recurs. 07/05/15   Dohmeier, Asencion Partridge, MD  triamcinolone ointment (KENALOG) 0.1 % Apply 1 application topically 2 (two) times daily. 03/10/17   Vicie Mutters, PA-C    Family History Family History  Problem Relation Age of Onset  . Emphysema Mother        smoked  . Allergies Mother   . Asthma Mother   . Heart disease Mother   . Breast cancer Mother        with mets to Bone  . Prostate cancer Father   . Brain cancer Father   . Allergic rhinitis Neg Hx   . Angioedema Neg Hx   . Eczema Neg Hx   . Immunodeficiency Neg Hx   . Urticaria Neg Hx     Social History Social History  Substance Use Topics  . Smoking  status: Never Smoker  . Smokeless tobacco: Never Used     Comment: husband smokes in the house  . Alcohol use No     Allergies   Levaquin [levofloxacin in d5w] and Doxycycline   Review of Systems Review of Systems A complete review of systems was obtained and all systems are negative except as noted in the HPI and PMH.   Physical Exam Updated Vital Signs BP (!) 115/55 (BP Location: Left Arm)   Pulse 93   Temp 98.4 F (36.9 C) (Oral)   Resp 18   Ht 5' 2.5" (1.588 m)   Wt 194 lb (88 kg)   SpO2 97%   BMI 34.92 kg/m   Physical Exam  Constitutional: She appears well-developed and well-nourished. No distress.  HENT:  Head: Normocephalic and atraumatic.  Eyes: Conjunctivae are normal.  Neck: Normal range of motion.  Cardiovascular: Normal rate, regular rhythm and normal heart sounds.   No murmur heard. Pulmonary/Chest: Effort normal and breath sounds normal. No respiratory distress. She has no wheezes. She has no rales.  Abdominal: Soft. Bowel sounds are normal. She exhibits no distension and no mass. There is no tenderness. There is no rebound and no guarding. No hernia.  Genitourinary: Rectal exam shows guaiac negative stool.  Genitourinary Comments: Chaperoned. Normal rectal tone. No thrombosed hemorrhoids. Normal stool color.  Musculoskeletal: Normal range of motion.  Neurological: She is alert.  Skin: No pallor.  Psychiatric: She has a normal mood and affect. Her behavior is normal.  Nursing note and vitals reviewed.  ED Treatments / Results  DIAGNOSTIC STUDIES: Oxygen Saturation is 97% on RA, normal by my interpretation.   COORDINATION OF CARE: 6:48 PM-Discussed next steps with pt. Pt verbalized understanding and is agreeable with the plan.   Labs (all labs ordered are listed, but only abnormal results are displayed) Labs Reviewed  BASIC METABOLIC PANEL - Abnormal; Notable for the following:       Result Value   CO2 21 (*)    Glucose, Bld 112 (*)     Calcium 8.6 (*)    All other components within normal limits  CBC - Abnormal; Notable for the following:    WBC 12.0 (*)  Hemoglobin 11.8 (*)    All other components within normal limits  URINALYSIS, ROUTINE W REFLEX MICROSCOPIC  I-STAT TROPONIN, ED  I-STAT TROPONIN, ED  POC OCCULT BLOOD, ED    EKG  EKG Interpretation None       Radiology Dg Chest 2 View  Result Date: 04/02/2017 CLINICAL DATA:  Chest pain EXAM: CHEST  2 VIEW COMPARISON:  September 12, 2016 FINDINGS: There is no edema or consolidation. The heart size and pulmonary vascularity are normal. No adenopathy. No pneumothorax. There is degenerative change in the thoracic spine. IMPRESSION: No edema or consolidation. Electronically Signed   By: Lowella Grip III M.D.   On: 04/02/2017 12:40    Procedures Procedures (including critical care time)  Medications Ordered in ED Medications - No data to display   Initial Impression / Assessment and Plan / ED Course  I have reviewed the triage vital signs and the nursing notes.  Pertinent labs & imaging results that were available during my care of the patient were reviewed by me and considered in my medical decision making (see chart for details).     Patient with epigastric pain and vomiting. She is feeling improved. I suspect she had a vasovagal reaction prior to vomiting. I doubt ACS as a cause. She is a very benign abdominal exam. Her fecal occult stool is negative. Patient will receive a CT of the abdomen and pelvis. She is 2 negative troponins here in the emergency department. Minimal risk factors for coronary artery disease, I doubt dissection of the aorta. Patient is normotensive. I have given sign out to PA McDonald, who will assume care for disposition of the patient based on her CT scan results.  Final Clinical Impressions(s) / ED Diagnoses   Final diagnoses:  None    New Prescriptions New Prescriptions   No medications on file   I personally performed  the services described in this documentation, which was scribed in my presence. The recorded information has been reviewed and is accurate.      Margarita Mail, PA-C 04/02/17 2146    Orlie Dakin, MD 04/02/17 2258

## 2017-04-02 NOTE — ED Triage Notes (Signed)
Pt states acute onset R and post neck pain that radiates down back.  Pain improves if she lays down.

## 2017-04-03 LAB — POC OCCULT BLOOD, ED: Fecal Occult Bld: NEGATIVE

## 2017-04-14 ENCOUNTER — Telehealth: Payer: Self-pay | Admitting: Internal Medicine

## 2017-04-14 DIAGNOSIS — Z1211 Encounter for screening for malignant neoplasm of colon: Secondary | ICD-10-CM

## 2017-04-14 NOTE — Addendum Note (Signed)
Addended by: Vicie Mutters R on: 04/14/2017 05:40 PM   Modules accepted: Orders

## 2017-04-14 NOTE — Telephone Encounter (Signed)
patient requesting a referral to GI, for colonoscopy evaluation. hx w/ Dr Earlean Shawl. Also,states she was in ED this weekend w/ abdominal pain, following up with Dr Marlou Starks @ [facility] this week. Please advise.

## 2017-04-14 NOTE — Telephone Encounter (Signed)
done

## 2017-05-01 ENCOUNTER — Other Ambulatory Visit: Payer: 59

## 2017-05-01 DIAGNOSIS — E039 Hypothyroidism, unspecified: Secondary | ICD-10-CM

## 2017-05-01 LAB — TSH: TSH: 2.78 mIU/L (ref 0.40–4.50)

## 2017-05-13 DIAGNOSIS — R1013 Epigastric pain: Secondary | ICD-10-CM | POA: Diagnosis not present

## 2017-05-26 DIAGNOSIS — K3189 Other diseases of stomach and duodenum: Secondary | ICD-10-CM | POA: Diagnosis not present

## 2017-05-26 DIAGNOSIS — R1013 Epigastric pain: Secondary | ICD-10-CM | POA: Diagnosis not present

## 2017-05-26 DIAGNOSIS — K2930 Chronic superficial gastritis without bleeding: Secondary | ICD-10-CM | POA: Diagnosis not present

## 2017-06-06 ENCOUNTER — Emergency Department (HOSPITAL_COMMUNITY)
Admission: EM | Admit: 2017-06-06 | Discharge: 2017-06-06 | Disposition: A | Discharge status: Home / Self Care | Payer: 59 | Attending: Emergency Medicine | Admitting: Emergency Medicine

## 2017-06-06 ENCOUNTER — Encounter (HOSPITAL_COMMUNITY): Payer: Self-pay | Admitting: Emergency Medicine

## 2017-06-06 ENCOUNTER — Emergency Department (HOSPITAL_COMMUNITY): Payer: 59

## 2017-06-06 DIAGNOSIS — D649 Anemia, unspecified: Secondary | ICD-10-CM | POA: Diagnosis not present

## 2017-06-06 DIAGNOSIS — Y929 Unspecified place or not applicable: Secondary | ICD-10-CM | POA: Diagnosis not present

## 2017-06-06 DIAGNOSIS — G4489 Other headache syndrome: Secondary | ICD-10-CM | POA: Diagnosis not present

## 2017-06-06 DIAGNOSIS — I129 Hypertensive chronic kidney disease with stage 1 through stage 4 chronic kidney disease, or unspecified chronic kidney disease: Secondary | ICD-10-CM | POA: Insufficient documentation

## 2017-06-06 DIAGNOSIS — S199XXA Unspecified injury of neck, initial encounter: Secondary | ICD-10-CM | POA: Diagnosis not present

## 2017-06-06 DIAGNOSIS — S0990XA Unspecified injury of head, initial encounter: Secondary | ICD-10-CM

## 2017-06-06 DIAGNOSIS — N182 Chronic kidney disease, stage 2 (mild): Secondary | ICD-10-CM | POA: Diagnosis not present

## 2017-06-06 DIAGNOSIS — J45909 Unspecified asthma, uncomplicated: Secondary | ICD-10-CM | POA: Diagnosis not present

## 2017-06-06 DIAGNOSIS — Z79899 Other long term (current) drug therapy: Secondary | ICD-10-CM | POA: Diagnosis not present

## 2017-06-06 DIAGNOSIS — M542 Cervicalgia: Secondary | ICD-10-CM | POA: Diagnosis not present

## 2017-06-06 DIAGNOSIS — R51 Headache: Secondary | ICD-10-CM | POA: Diagnosis not present

## 2017-06-06 DIAGNOSIS — Y9389 Activity, other specified: Secondary | ICD-10-CM | POA: Diagnosis not present

## 2017-06-06 DIAGNOSIS — Y999 Unspecified external cause status: Secondary | ICD-10-CM | POA: Diagnosis not present

## 2017-06-06 DIAGNOSIS — E039 Hypothyroidism, unspecified: Secondary | ICD-10-CM | POA: Diagnosis not present

## 2017-06-06 DIAGNOSIS — S098XXA Other specified injuries of head, initial encounter: Secondary | ICD-10-CM | POA: Diagnosis not present

## 2017-06-06 MED ORDER — NAPROXEN 375 MG PO TABS: 375.0000 mg | ORAL_TABLET | Freq: Two times a day (BID) | ORAL | 0 refills | Status: DC

## 2017-06-06 MED ORDER — CYCLOBENZAPRINE HCL 10 MG PO TABS: 10.0000 mg | ORAL_TABLET | Freq: Two times a day (BID) | ORAL | 0 refills | Status: DC | PRN

## 2017-06-06 NOTE — Discharge Instructions (Signed)
X-rays were normal. You will be sore for several days. Prescription for muscle relaxer and anti-inflammatory medicine.
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
## 2017-06-16 ENCOUNTER — Encounter: Payer: Self-pay | Admitting: *Deleted

## 2017-07-02 ENCOUNTER — Ambulatory Visit (INDEPENDENT_AMBULATORY_CARE_PROVIDER_SITE_OTHER): Payer: 59 | Admitting: Physician Assistant

## 2017-07-02 ENCOUNTER — Encounter: Payer: Self-pay | Admitting: Physician Assistant

## 2017-07-02 VITALS — BP 126/82 | HR 77 | Temp 97.3°F | Resp 16 | Ht 62.5 in | Wt 201.6 lb

## 2017-07-02 DIAGNOSIS — E785 Hyperlipidemia, unspecified: Secondary | ICD-10-CM | POA: Diagnosis not present

## 2017-07-02 DIAGNOSIS — E119 Type 2 diabetes mellitus without complications: Secondary | ICD-10-CM | POA: Diagnosis not present

## 2017-07-02 DIAGNOSIS — Z79899 Other long term (current) drug therapy: Secondary | ICD-10-CM

## 2017-07-02 DIAGNOSIS — N182 Chronic kidney disease, stage 2 (mild): Secondary | ICD-10-CM | POA: Diagnosis not present

## 2017-07-02 DIAGNOSIS — I10 Essential (primary) hypertension: Secondary | ICD-10-CM | POA: Diagnosis not present

## 2017-07-02 DIAGNOSIS — R109 Unspecified abdominal pain: Secondary | ICD-10-CM

## 2017-07-02 MED ORDER — LACTULOSE 10 GM/15ML PO SOLN: 20.0000 g | Freq: Two times a day (BID) | ORAL | 0 refills | Status: DC

## 2017-07-02 NOTE — Patient Instructions (Addendum)
Can try the lactulose for constipation Start out with 10mg  or 15 ml once a day, can go up to 30 ml twice a day as needed for constipation Please also make sure you are drinking a lot of water, up to 64-80 oz a day Try to increase your fiber such as whole graines and veggies You can also add a fiber supplement like Citracel or Benefiber, these do not cause gas and bloating and are safe to use.   Walking or moving 20 mins a day can helps as well  If you have severe AB pain, no BM for 3-4 days, or blood in your stool let us know.   Can use triamcinolone ointment or hydrocortisone cream WITH the estrogen and will send in nystatin cream to use, can combine together.   Avoid carbs Try apple cider vinegar 1-2 tsp in water/tea daily Can try to get over the counter probiotics if you want but try the apple cider vinegar first.   Abdominal Pain, Adult Many things can cause belly (abdominal) pain. Most times, belly pain is not dangerous. Many cases of belly pain can be watched and treated at home. Sometimes belly pain is serious, though. Your doctor will try to find the cause of your belly pain. Follow these instructions at home:  Take over-the-counter and prescription medicines only as told by your doctor. Do not take medicines that help you poop (laxatives) unless told to by your doctor.  Drink enough fluid to keep your pee (urine) clear or pale yellow.  Watch your belly pain for any changes.  Keep all follow-up visits as told by your doctor. This is important. Contact a doctor if:  Your belly pain changes or gets worse.  You are not hungry, or you lose weight without trying.  You are having trouble pooping (constipated) or have watery poop (diarrhea) for more than 2-3 days.  You have pain when you pee or poop.  Your belly pain wakes you up at night.  Your pain gets worse with meals, after eating, or with certain foods.  You are throwing up and cannot keep anything down.  You have a  fever. Get help right away if:  Your pain does not go away as soon as your doctor says it should.  You cannot stop throwing up.  Your pain is only in areas of your belly, such as the right side or the left lower part of the belly.  You have bloody or black poop, or poop that looks like tar.  You have very bad pain, cramping, or bloating in your belly.  You have signs of not having enough fluid or water in your body (dehydration), such as: ? Dark pee, very little pee, or no pee. ? Cracked lips. ? Dry mouth. ? Sunken eyes. ? Sleepiness. ? Weakness. This information is not intended to replace advice given to you by your health care provider. Make sure you discuss any questions you have with your health care provider. Document Released: 01/29/2008 Document Revised: 03/01/2016 Document Reviewed: 01/24/2016 Elsevier Interactive Patient Education  2017 [employer].

## 2017-07-02 NOTE — Progress Notes (Signed)
Assessment and Plan:   Essential hypertension - continue medications, DASH diet, exercise and monitor at home. Call if greater than 130/80.  - CBC with Differential/Platelet - Hepatic function panel - BASIC METABOLIC PANEL WITH GFR - TSH  Type 2 diabetes mellitus without complication, without long-term current use of insulin (HCC) - Hemoglobin A1c Discussed general issues about diabetes pathophysiology and management., Educational material distributed., Suggested low cholesterol diet., Encouraged aerobic exercise., Discussed foot care., Reminded to get yearly retinal exam.  CKD (chronic kidney disease) stage 2, GFR 60-89 ml/min Increase fluids, avoid NSAIDS, monitor sugars, will monitor - BASIC METABOLIC PANEL WITH GFR  Hyperlipemia -continue medications, check lipids, decrease fatty foods, increase activity.  - Lipid panel   Morbid obesity, unspecified obesity type ([place]) Obesity with co morbidities- long discussion about weight loss, diet, and exercise   Medication management - Magnesium  Right flank pain/constipation Normal CT AB 03/2017, will try lactulose, if this does not help will repeat imaging/US    Continue diet and meds as discussed. Further disposition pending results of labs. Future Appointments  Date Time Provider [place]  09/16/2017  3:00 PM Vicie Mutters, PA-C [facility] None    HPI 61 y.o. female  presents for 3 month follow up with hypertension, hyperlipidemia, prediabetes and vitamin D. S/p choley on 06/28, still having right flank pain, states feels like when she had endometriosis. Will be sitting and can have sharp right flank pain moving towards her back last seconds, and has constant ache. No fever or chills, had EGD that showed gastritis, no nausea since that time, severe constipation takes miralax and senokot. Has CT AB 03/2017, no stone at that time and normal Ct AB.   Her blood pressure has been controlled at home, today their BP is BP:  126/82  She does workout, walks and wears her fitbit, tries to walk 2 miles a day.  She denies chest pain, shortness of breath, dizziness.   She is not on cholesterol medication and denies myalgias. Her cholesterol is not at goal. The cholesterol last visit was:   Lab Results  Component Value Date   CHOL 217 (H) 03/24/2017   HDL 63 03/24/2017   LDLCALC 129 (H) 03/24/2017   TRIG 123 03/24/2017   CHOLHDL 3.4 03/24/2017    She has been working on diet and exercise for prediabetes, and denies paresthesia of the feet, polydipsia, polyuria and visual disturbances. Last A1C in the office was:  Lab Results  Component Value Date   HGBA1C 5.8 (H) 03/24/2017   Patient is on Vitamin D supplement.   Lab Results  Component Value Date   VD25OH 27 (L) 09/10/2016    BMI is Body mass index is 36.29 kg/m., she is working on diet and exercise. She has been following with Dr. Brett Fairy for OSA/migraines.   Wt Readings from Last 3 Encounters:  07/02/17 201 lb 9.6 oz (91.4 kg)  04/02/17 194 lb (88 kg)  03/24/17 194 lb 3.2 oz (88.1 kg)   She is on thyroid medication. Her medication was not changed last visit.   Lab Results  Component Value Date   TSH 2.78 05/01/2017  She has history of lumbar back surgery over 17 years ago with fusion L4-L5, has had injection with Dr. Vira Blanco.    Current Medications:  Current Outpatient Medications on File Prior to Visit  Medication Sig Dispense Refill  . acetaminophen (TYLENOL) 500 MG tablet Take 1,000 mg by mouth every 6 (six) hours as needed for mild pain, moderate  pain or headache.    . albuterol (PROAIR HFA) 108 (90 Base) MCG/ACT inhaler Inhale 2 puffs into the lungs every 6 (six) hours as needed for wheezing. 3 Inhaler 3  . amitriptyline (ELAVIL) 10 MG tablet TAKE 1 TO 2 TABLETS BY MOUTH DAILY AT BEDTIME (Patient taking differently: TAKE 10mg  BY MOUTH DAILY AT BEDTIME as needed for sleep) 180 tablet 1  . benzonatate (TESSALON) 100 MG capsule Take 100 mg by mouth  2 (two) times daily as needed for cough.     . chlorpheniramine (CHLOR-TRIMETON) 4 MG tablet Take 4 mg by mouth every 6 (six) hours as needed for allergies.    Marland Kitchen ESTRACE VAGINAL 0.1 MG/GM vaginal cream Place 1 Applicatorful vaginally 2 (two) times a week. (Patient taking differently: Place 1 Applicatorful vaginally daily as needed. ) 127.5 g 1  . furosemide (LASIX) 40 MG tablet TAKE 1 TABLET EVERY MORNING FOR BLOOD PRESSURE AND FLUID (Patient taking differently: Take 40 mg by mouth daily as needed for edema. ) 90 tablet 1  . levothyroxine (SYNTHROID, LEVOTHROID) 75 MCG tablet Take 1 tablet (75 mcg total) by mouth daily. (Patient taking differently: Take 75 mcg by mouth daily before breakfast. ) 90 tablet 3  . losartan (COZAAR) 100 MG tablet TAKE 1 TABLET DAILY (Patient taking differently: TAKE 100mg  by mouth TABLET) 90 tablet 1  . metFORMIN (GLUCOPHAGE-XR) 500 MG 24 hr tablet Take 1 tablet by mouth 2 times daily with meals (Patient taking differently: Take 500 mg by mouth See admin instructions. Take 500mg  in the morning, and at lunch) 180 tablet 1  . montelukast (SINGULAIR) 10 MG tablet Take 1 tablet (10 mg total) by mouth daily. 90 tablet 3  . NUCYNTA 50 MG TABS tablet TAKE 1 TABLET (50 mg) four times a day as needed for severe headache  0  . pantoprazole (PROTONIX) 40 MG tablet Take 1 tablet (40 mg total) by mouth daily. (Patient taking differently: Take 40 mg by mouth every other day. Alternates with ranitidine) 90 tablet 1  . ranitidine (ZANTAC) 300 MG tablet TAKE TWICE A DAY WHILE TRYING TO GET OFF PPI (Patient taking differently: Take 300 mg by mouth at bedtime. Takes Pantoprazole in the morning) 180 tablet 3  . sertraline (ZOLOFT) 100 MG tablet Take 1-2 tablets daily for anxiety (Patient taking differently: Take 100 mg by mouth daily. ) 180 tablet 3  . SUMAtriptan (IMITREX) 100 MG tablet Take 1 tablet (100 mg total) by mouth once as needed for migraine. May repeat in 2 hours if headache persists  or recurs. 10 tablet 2  . triamcinolone ointment (KENALOG) 0.1 % Apply 1 application topically 2 (two) times daily. 80 g 1  . cyclobenzaprine (FLEXERIL) 10 MG tablet Take 1 tablet (10 mg total) by mouth 2 (two) times daily as needed for muscle spasms. (Patient not taking: Reported on 07/02/2017) 20 tablet 0   No current facility-administered medications on file prior to visit.    Medical History:  Past Medical History:  Diagnosis Date  . Anemia    hx  . Anxiety   . Arthritis    back  . Asthma   . Depression   . Deviated septum   . GERD (gastroesophageal reflux disease)   . Headache   . History of hiatal hernia   . Hyperlipemia   . Hypertension   . Hypothyroidism   . OSA (obstructive sleep apnea)    does not use -last 6 months  . Pre-diabetes    per pt  Allergies:  Allergies  Allergen Reactions  . Levaquin [Levofloxacin In D5w] Other (See Comments)    Ankles hurt really bad ? ACHILLES TENDON ?  Marland Kitchen Doxycycline Other (See Comments)    UNSPECIFIED REACTION     Review of Systems:  Review of Systems  Constitutional: Positive for malaise/fatigue. Negative for chills, diaphoresis, fever and weight loss.  HENT: Negative for congestion, ear discharge, ear pain, hearing loss, nosebleeds, sore throat and tinnitus.   Eyes: Negative.   Respiratory: Negative.  Negative for stridor.   Cardiovascular: Negative.   Gastrointestinal: Positive for constipation. Negative for abdominal pain, blood in stool, diarrhea, heartburn, melena, nausea and vomiting.  Genitourinary: Negative.   Musculoskeletal: Positive for back pain. Negative for falls, joint pain, myalgias and neck pain.  Skin: Negative.   Neurological: Positive for tingling (right leg) and headaches. Negative for dizziness, tremors, sensory change, speech change, focal weakness, seizures, loss of consciousness and weakness.  Psychiatric/Behavioral: Negative for depression, hallucinations, memory loss, substance abuse and suicidal  ideas. The patient is nervous/anxious. The patient does not have insomnia.     Family history- Review and unchanged Social history- Review and unchanged Physical Exam: BP 126/82   Pulse 77   Temp (!) 97.3 F (36.3 C)   Resp 16   Ht 5' 2.5" (1.588 m)   Wt 201 lb 9.6 oz (91.4 kg)   SpO2 98%   BMI 36.29 kg/m  Wt Readings from Last 3 Encounters:  07/02/17 201 lb 9.6 oz (91.4 kg)  04/02/17 194 lb (88 kg)  03/24/17 194 lb 3.2 oz (88.1 kg)   General Appearance: Well nourished, in no apparent distress. Eyes: PERRLA, EOMs, conjunctiva no swelling or erythema Sinuses: No Frontal/maxillary tenderness ENT/Mouth: Ext aud canals clear, TMs without erythema, bulging. No erythema, swelling, or exudate on post pharynx.  Tonsils not swollen or erythematous. Hearing normal.  Neck: Supple, thyroid normal.  Respiratory: Respiratory effort normal, BS equal bilaterally without rales, rhonchi, wheezing or stridor.  Cardio: RRR with no MRGs. Brisk peripheral pulses without edema.  Abdomen: Soft, + BS,  mild epigastric tenderness, no guarding, rebound, hernias, masses. Lymphatics: Non tender without lymphadenopathy.  Musculoskeletal: Full ROM, 5/5 strength, Normal gait,  Skin: Port wine stain right face. Warm, dry without rashes, lesions, ecchymosis.  Neuro: Cranial nerves intact. Normal muscle tone, no cerebellar symptoms. Psych: Awake and oriented X 3, normal affect, Insight and Judgment appropriate.    Vicie Mutters, PA-C 4:52 PM [facility]

## 2017-07-03 LAB — BASIC METABOLIC PANEL WITHOUT GFR
BUN: 19 mg/dL (ref 7–25)
CO2: 28 mmol/L (ref 20–32)
Calcium: 9 mg/dL (ref 8.6–10.4)
Chloride: 102 mmol/L (ref 98–110)
Creat: 0.89 mg/dL (ref 0.50–0.99)
GFR, Est African American: 82 mL/min/1.73m2
GFR, Est Non African American: 70 mL/min/1.73m2
Glucose, Bld: 98 mg/dL (ref 65–99)
Potassium: 4.1 mmol/L (ref 3.5–5.3)
Sodium: 139 mmol/L (ref 135–146)

## 2017-07-03 LAB — HEPATIC FUNCTION PANEL
AG Ratio: 1.4 (calc) (ref 1.0–2.5)
ALT: 19 U/L (ref 6–29)
AST: 18 U/L (ref 10–35)
Albumin: 4 g/dL (ref 3.6–5.1)
Alkaline phosphatase (APISO): 78 U/L (ref 33–130)
Bilirubin, Direct: 0.1 mg/dL (ref 0.0–0.2)
Globulin: 2.8 g/dL (calc) (ref 1.9–3.7)
Indirect Bilirubin: 0.2 mg/dL (calc) (ref 0.2–1.2)
Total Bilirubin: 0.3 mg/dL (ref 0.2–1.2)
Total Protein: 6.8 g/dL (ref 6.1–8.1)

## 2017-07-03 LAB — HEMOGLOBIN A1C
Hgb A1c MFr Bld: 5.9 %{Hb} — ABNORMAL HIGH
Mean Plasma Glucose: 123 (calc)
eAG (mmol/L): 6.8 (calc)

## 2017-07-03 LAB — URINE CULTURE
MICRO NUMBER:: 81253688
SPECIMEN QUALITY:: ADEQUATE

## 2017-07-03 LAB — CBC WITH DIFFERENTIAL/PLATELET
Basophils Absolute: 49 {cells}/uL (ref 0–200)
Basophils Relative: 0.5 %
Eosinophils Absolute: 475 {cells}/uL (ref 15–500)
Eosinophils Relative: 4.9 %
HCT: 37.6 % (ref 35.0–45.0)
Hemoglobin: 12.1 g/dL (ref 11.7–15.5)
Lymphs Abs: 3385 {cells}/uL (ref 850–3900)
MCH: 25.8 pg — ABNORMAL LOW (ref 27.0–33.0)
MCHC: 32.2 g/dL (ref 32.0–36.0)
MCV: 80.2 fL (ref 80.0–100.0)
MPV: 10 fL (ref 7.5–12.5)
Monocytes Relative: 6.4 %
Neutro Abs: 5170 {cells}/uL (ref 1500–7800)
Neutrophils Relative %: 53.3 %
Platelets: 314 Thousand/uL (ref 140–400)
RBC: 4.69 Million/uL (ref 3.80–5.10)
RDW: 14.3 % (ref 11.0–15.0)
Total Lymphocyte: 34.9 %
WBC mixed population: 621 {cells}/uL (ref 200–950)
WBC: 9.7 Thousand/uL (ref 3.8–10.8)

## 2017-07-03 LAB — URINALYSIS, ROUTINE W REFLEX MICROSCOPIC
Bilirubin Urine: NEGATIVE
Glucose, UA: NEGATIVE
Hgb urine dipstick: NEGATIVE
Ketones, ur: NEGATIVE
Leukocytes, UA: NEGATIVE
Nitrite: NEGATIVE
Protein, ur: NEGATIVE
Specific Gravity, Urine: 1.023 (ref 1.001–1.03)
pH: 7 (ref 5.0–8.0)

## 2017-07-03 LAB — LIPID PANEL
Cholesterol: 200 mg/dL — ABNORMAL HIGH (ref <200)
HDL: 58 mg/dL (ref >50)
LDL Cholesterol (Calc): 115 mg/dL (calc) — ABNORMAL HIGH
Non-HDL Cholesterol (Calc): 142 mg/dL (calc) — ABNORMAL HIGH (ref <130)
Total CHOL/HDL Ratio: 3.4 (calc) (ref <5.0)
Triglycerides: 153 mg/dL — ABNORMAL HIGH (ref <150)

## 2017-07-03 LAB — MAGNESIUM: Magnesium: 1.9 mg/dL (ref 1.5–2.5)

## 2017-07-03 LAB — TSH: TSH: 2.45 m[IU]/L (ref 0.40–4.50)

## 2017-08-01 ENCOUNTER — Other Ambulatory Visit: Payer: Self-pay | Admitting: Internal Medicine

## 2017-08-01 ENCOUNTER — Other Ambulatory Visit: Payer: Self-pay | Admitting: Physician Assistant

## 2017-08-01 DIAGNOSIS — E119 Type 2 diabetes mellitus without complications: Secondary | ICD-10-CM

## 2017-08-28 ENCOUNTER — Other Ambulatory Visit: Payer: Self-pay | Admitting: *Deleted

## 2017-08-28 MED ORDER — LOSARTAN POTASSIUM 100 MG PO TABS: 100.0000 mg | ORAL_TABLET | Freq: Every day | ORAL | 1 refills | Status: DC

## 2017-08-29 DIAGNOSIS — H26491 Other secondary cataract, right eye: Secondary | ICD-10-CM | POA: Diagnosis not present

## 2017-09-16 ENCOUNTER — Encounter: Payer: Self-pay | Admitting: Physician Assistant

## 2017-09-25 ENCOUNTER — Other Ambulatory Visit: Payer: Self-pay

## 2017-09-25 MED ORDER — PANTOPRAZOLE SODIUM 40 MG PO TBEC: 40.0000 mg | DELAYED_RELEASE_TABLET | Freq: Every day | ORAL | 1 refills | Status: DC

## 2017-09-29 ENCOUNTER — Other Ambulatory Visit: Payer: Self-pay | Admitting: Physician Assistant

## 2017-09-29 ENCOUNTER — Telehealth: Payer: Self-pay | Admitting: Physician Assistant

## 2017-09-29 MED ORDER — AZITHROMYCIN 250 MG PO TABS: ORAL_TABLET | ORAL | 1 refills | Status: DC

## 2017-09-29 NOTE — Telephone Encounter (Signed)
Patient has colonoscopy on Monday. Has coughing, blood tinged mucus x 1 week. Will send in zpak.

## 2017-10-02 ENCOUNTER — Encounter: Payer: Self-pay | Admitting: Adult Health

## 2017-10-02 ENCOUNTER — Ambulatory Visit (INDEPENDENT_AMBULATORY_CARE_PROVIDER_SITE_OTHER): Payer: 59 | Admitting: Adult Health

## 2017-10-02 VITALS — BP 116/78 | HR 76 | Temp 97.9°F | Ht 62.5 in | Wt 210.0 lb

## 2017-10-02 DIAGNOSIS — J0181 Other acute recurrent sinusitis: Secondary | ICD-10-CM | POA: Diagnosis not present

## 2017-10-02 DIAGNOSIS — J209 Acute bronchitis, unspecified: Secondary | ICD-10-CM | POA: Diagnosis not present

## 2017-10-02 MED ORDER — PREDNISONE 20 MG PO TABS: ORAL_TABLET | ORAL | 0 refills | Status: DC

## 2017-10-02 MED ORDER — PROMETHAZINE-DM 6.25-15 MG/5ML PO SYRP: 5.0000 mL | ORAL_SOLUTION | Freq: Four times a day (QID) | ORAL | 1 refills | Status: DC | PRN

## 2017-10-02 MED ORDER — AZITHROMYCIN 250 MG PO TABS: ORAL_TABLET | ORAL | 1 refills | Status: AC

## 2017-10-02 NOTE — Patient Instructions (Signed)

## 2017-10-02 NOTE — Progress Notes (Signed)
Assessment and Plan:  Kristina Webb was seen today for uri.  Diagnoses and all orders for this visit:  Acute bronchitis, unspecified organism/mild asthma exacerbation Start allergy pill daily 3 weeks prior to annual bronchitis/exacerbation Mometasone furoate 100 mcg 1 puff twice daily x 2 weeks- sample provided. Discussed if this is helpful may initiate this medication in the future during typical exacerbation periods.  -     predniSONE (DELTASONE) 20 MG tablet; 2 tablets daily for 3 days, 1 tablet daily for 4 days. -     promethazine-dextromethorphan (PROMETHAZINE-DM) 6.25-15 MG/5ML syrup; Take 5 mLs by mouth 4 (four) times daily as needed for cough. -     azithromycin (ZITHROMAX) 250 MG tablet; Take 2 tablets (500 mg) on  Day 1,  followed by 1 tablet (250 mg) once daily on Days 2 through 5.  Other acute recurrent sinusitis Nasal saline spray for congestion. Nasal steroids, allergy pill, oral steroids - start  Follow up as needed. -     predniSONE (DELTASONE) 20 MG tablet; 2 tablets daily for 3 days, 1 tablet daily for 4 days. -     azithromycin (ZITHROMAX) 250 MG tablet; Take 2 tablets (500 mg) on  Day 1,  followed by 1 tablet (250 mg) once daily on Days 2 through 5.    Further disposition pending results of labs. Discussed med's effects and SE's.   Over 15 minutes of exam, counseling, chart review, and critical decision making was performed.   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 She reports she then started having nasal congestion  with facial pressure/pain on the R, now having R chest tightness with cough. She reports she experiences similar symptoms annually. Not a smoker. Denies dyspnea, fever/chills - "feel warm" - endorses mild headaches.   She has been taking mucinex, tessalon. She was prescribed zpak and prednisone and has nearly completed 1 course with ongoing symptoms. She has mild intermittent asthma and takes singulair daily with albuterol PRN - has been using approx 1 x daily. She does endorse environmental allergies, takes prescribed chlorpheniramine maleate as needed for allergy symptoms but has not taken this recently.   Past Medical History:  Diagnosis Date  . Anemia    hx  . Anxiety   . Arthritis    back  . Asthma   . Depression   . Deviated septum   . GERD (gastroesophageal reflux disease)   . Headache   . History of hiatal hernia   . Hyperlipemia   . Hypertension   . Hypothyroidism   . OSA (obstructive sleep apnea)    does not use -last 6 months  . Pre-diabetes    per pt     Allergies  Allergen Reactions  . Levaquin [Levofloxacin In D5w] Other (See Comments)    Ankles hurt really bad ? ACHILLES TENDON ?  Marland Kitchen Doxycycline Other (See Comments)    UNSPECIFIED REACTION     Current Outpatient Medications on File Prior to Visit  Medication Sig  . acetaminophen (TYLENOL) 500 MG tablet Take 1,000 mg by mouth  every 6 (six) hours as needed for mild pain, moderate pain or headache.  . albuterol (PROAIR HFA) 108 (90 Base) MCG/ACT inhaler Inhale 2 puffs into the lungs every 6 (six) hours as needed for wheezing.  Marland Kitchen amitriptyline (ELAVIL) 10 MG tablet TAKE 1 TO 2 TABLETS BY MOUTH DAILY AT BEDTIME (Patient taking differently: TAKE 10mg  BY MOUTH DAILY AT BEDTIME as needed for sleep)  . benzonatate (TESSALON) 100 MG capsule Take 100 mg by mouth 2 (two) times daily as needed for cough.   . chlorpheniramine (CHLOR-TRIMETON) 4 MG tablet Take 4 mg by mouth every 6 (six) hours as needed for allergies.  .  cyclobenzaprine (FLEXERIL) 10 MG tablet Take 1 tablet (10 mg total) by mouth 2 (two) times daily as needed for muscle spasms.  Adora Fridge VAGINAL 0.1 MG/GM vaginal cream Place 1 Applicatorful vaginally 2 (two) times a week. (Patient taking differently: Place 1 Applicatorful vaginally daily as needed. )  . furosemide (LASIX) 40 MG tablet TAKE 1 TABLET EVERY MORNING FOR BLOOD PRESSURE AND FLUID (Patient taking differently: Take 40 mg by mouth daily as needed for edema. )  . lactulose (CHRONULAC) 10 GM/15ML solution Take 30 mLs (20 g total) 2 (two) times daily by mouth.  . losartan (COZAAR) 100 MG tablet Take 1 tablet (100 mg total) by mouth daily.  . metFORMIN (GLUCOPHAGE-XR) 500 MG 24 hr tablet Take 1 to 2 tablets 2 x / day with food for Diabetes  . montelukast (SINGULAIR) 10 MG tablet Take 1 tablet (10 mg total) by mouth daily.  . NUCYNTA 50 MG TABS tablet TAKE 1 TABLET (50 mg) four times a day as needed for severe headache  . pantoprazole (PROTONIX) 40 MG tablet Take 1 tablet (40 mg total) by mouth daily.  . ranitidine (ZANTAC) 300 MG tablet TAKE TWICE A DAY WHILE TRYING TO GET OFF PPI (Patient taking differently: Take 300 mg by mouth at bedtime. Takes Pantoprazole in the morning)  . sertraline (ZOLOFT) 100 MG tablet Take 1-2 tablets daily for anxiety (Patient taking differently: Take 100 mg by mouth daily. )  . SUMAtriptan (IMITREX) 100 MG tablet Take 1 tablet (100 mg total) by mouth once as needed for migraine. May repeat in 2 hours if headache persists or recurs.  Marland Kitchen SYNTHROID 75 MCG tablet TAKE 1 TABLET DAILY  . triamcinolone ointment (KENALOG) 0.1 % Apply 1 application topically 2 (two) times daily. (Patient not taking: Reported on 10/02/2017)   No current facility-administered medications on file prior to visit.     AYT:KZSWFU of Systems  Constitutional: Negative for chills, diaphoresis, fever and malaise/fatigue.  HENT: Positive for congestion and sinus pain (Right sided). Negative for ear  discharge, ear pain, hearing loss, sore throat and tinnitus.   Eyes: Negative for blurred vision, pain, discharge and redness.  Respiratory: Positive for cough, sputum production and wheezing (Mild intermittent). Negative for hemoptysis, shortness of breath and stridor.   Cardiovascular: Negative for chest pain, palpitations and orthopnea.  Gastrointestinal: Negative for abdominal pain, diarrhea, nausea and vomiting.  Genitourinary: Negative.   Musculoskeletal: Negative for joint pain and myalgias.  Skin: Negative for rash.  Neurological: Negative for dizziness, sensory change, weakness and headaches.  Endo/Heme/Allergies: Negative for environmental allergies.  Psychiatric/Behavioral: Negative.   All other systems reviewed and are negative.    Physical Exam:  BP 116/78   Pulse 76   Temp 97.9 F (36.6 C)   Ht 5' 2.5" (1.588 m)   Wt 210 lb (95.3 kg)   SpO2  96%   BMI 37.80 kg/m   General Appearance: Well nourished, in no acute distress. Eyes: PERRLA, EOMs, conjunctiva no swelling or erythema Sinuses: R sided Frontal/maxillary tenderness ENT/Mouth: Ext aud canals clear, TMs without erythema, bulging. No erythema, swelling, or exudate on post pharynx.  Tonsils not swollen or erythematous. Hearing normal.  Neck: Supple, thyroid normal. lymphadenopathy.  Respiratory: Respiratory effort normal, BS equal bilaterally, somewhat coarse throughout with scattered end expiratory rhonchi,  without rales, wheezing or stridor.  Cardio: RRR with no MRGs. Brisk peripheral pulses without edema.  Abdomen: Soft, + BS.  Non tender, no guarding, rebound, hernias, masses. Lymphatics: Some tenderness of R side of neck without palpable  lymphadenopathy.  Musculoskeletal: Full ROM, 5/5 strength, normal gait.  Skin: Warm, dry without rashes, lesions, ecchymosis. Large red/purple birthmark to R side of face from lips to over brow covering majority of cheek.  Psych: Awake and oriented X 3, normal affect,  Insight and Judgment appropriate.     Izora Ribas, NP 12:19 PM [facility]

## 2017-10-10 DIAGNOSIS — H43812 Vitreous degeneration, left eye: Secondary | ICD-10-CM | POA: Diagnosis not present

## 2017-10-14 DIAGNOSIS — Z1211 Encounter for screening for malignant neoplasm of colon: Secondary | ICD-10-CM | POA: Diagnosis not present

## 2017-10-14 DIAGNOSIS — Z5309 Procedure and treatment not carried out because of other contraindication: Secondary | ICD-10-CM | POA: Diagnosis not present

## 2017-10-14 DIAGNOSIS — K219 Gastro-esophageal reflux disease without esophagitis: Secondary | ICD-10-CM | POA: Diagnosis not present

## 2017-10-14 DIAGNOSIS — Z86010 Personal history of colonic polyps: Secondary | ICD-10-CM | POA: Diagnosis not present

## 2017-10-21 NOTE — Progress Notes (Signed)
Complete Physical  Assessment and Plan:  Diagnoses and all orders for this visit:  Encounter for general adult medical examination with abnormal findings  Essential hypertension Continue medications Monitor blood pressure at home; call if consistently over 130/80 Continue DASH diet.   Reminder to go to the ER if any CP, SOB, nausea, dizziness, severe HA, changes vision/speech, left arm numbness and tingling and jaw pain. -     EKG 12-Lead  Sturge-Weber syndrome (HCC)  Uncomplicated asthma, unspecified asthma severity, unspecified whether persistent Continue inhalers, well managed, f/u allergist/pulmonology as needed  OSA (obstructive sleep apnea) CPAP has been recommended  Gastroesophageal reflux disease without esophagitis Well managed on current medications; will not taper PPI per recent EGD and GI recommendation Discussed diet, avoiding triggers and other lifestyle changes  Type 2 diabetes mellitus with stage 2 chronic kidney disease, without long-term current use of insulin ([place]) Discussed general issues about diabetes pathophysiology and management., Educational material distributed., Suggested low cholesterol diet., Encouraged aerobic exercise., Discussed foot care., Reminded to get yearly retinal exam and requested reports -     BASIC METABOLIC PANEL WITH GFR -     Hemoglobin A1c -     Microalbumin / creatinine urine ratio  CKD (chronic kidney disease) stage 2, GFR 60-89 ml/min Increase fluids, avoid NSAIDS, monitor sugars, will monitor -     BASIC METABOLIC PANEL WITH GFR -     Microalbumin / creatinine urine ratio  Anemia, unspecified type -     CBC with Differential/Platelet -     Vitamin B12 -     Iron,Total/Total Iron Binding Cap  Depression, unspecified depression type Continue medications  Lifestyle discussed: diet/exerise, sleep hygiene, stress management, hydration  Hyperlipidemia, unspecified hyperlipidemia type Currently treated by lifestyle only;  discussed possible benefit of statin r/t DM Continue low cholesterol diet and exercise.  -     Lipid panel -     TSH  Obesity, morbid ([place]) Long discussion about weight loss, diet, and exercise Recommended diet heavy in fruits and veggies and low in animal meats, cheeses, and dairy products, appropriate calorie intake Patient will work on calorie tracking via app, suggestions made, guidelines for exercise and vegetable/fruit/beans intake discussed Discussed appropriate weight for height  Follow up at next visit  Vitamin D deficiency Patient has not been supplementing despite recommendation; discussed goals and benefits  -     VITAMIN D 25 Hydroxy (Vit-D Deficiency, Fractures)  Medication management -     CBC with Differential/Platelet -     BASIC METABOLIC PANEL WITH GFR -     Hepatic function panel -     Urinalysis w microscopic + reflex cultur   Discussed med's effects and SE's. Screening labs and tests as requested with regular follow-up as recommended. Over 40 minutes of exam, counseling, chart review, and complex, high level critical decision making was performed this visit.   Future Appointments  Date Time Provider [place]  01/26/2018  4:00 PM Liane Comber, NP [facility] None  04/29/2018  4:30 PM Vicie Mutters, PA-C [facility] None  10/22/2018  3:00 PM Liane Comber, NP [facility] None     HPI  62 y.o. female  presents for a complete physical and follow up for has Cough; Hypertension; GERD (gastroesophageal reflux disease); Anemia; Asthma; Depression; OSA (obstructive sleep apnea); Hyperlipemia; Type 2 diabetes mellitus (HCC); CKD (chronic kidney disease) stage 2, GFR 60-89 ml/min; Sturge-Weber syndrome ([place]); Obesity, morbid ([place]); and Lower back pain on their problem list. She is recovering from R  cataract surgery from this past year; at 6 month follow up "scarring" was noted and has follow up soon to discuss this with Dr. Harlow Mares. She reports she has some  ongoing RUQ "achiness" ongoing since after gallbladder surgery; she also has hx of partial colectomy r/t endometriosis. She recently completed EGD by GI but was unable to complete scheduled colonoscopy due to inadequate prep and has this scheduled for March. She plans to discuss ongoing mild pain at that time.   BMI is Body mass index is 37.73 kg/m., she has been working on diet and exercise. Wt Readings from Last 3 Encounters:  10/22/17 213 lb (96.6 kg)  10/02/17 210 lb (95.3 kg)  07/02/17 201 lb 9.6 oz (91.4 kg)   Her blood pressure has been controlled at home, today their BP is BP: 116/84 She does not workout. She denies chest pain, shortness of breath, dizziness.   She is not on cholesterol medication and denies myalgias. Her cholesterol is not at goal. The cholesterol last visit was:   Lab Results  Component Value Date   CHOL 200 (H) 07/02/2017   HDL 58 07/02/2017   LDLCALC 129 (H) 03/24/2017   TRIG 153 (H) 07/02/2017   CHOLHDL 3.4 07/02/2017    She has been working on diet and exercise for T2DM, she is not on bASA, she is on ACE/ARB and denies increased appetite, nausea, paresthesia of the feet, polydipsia, polyuria, visual disturbances, vomiting and weight loss. Last A1C in the office was:  Lab Results  Component Value Date   HGBA1C 5.9 (H) 07/02/2017   Last GFR: Lab Results  Component Value Date   GFRNONAA 70 07/02/2017   Patient is not on Vitamin D supplement and well below goal at the last check:    Lab Results  Component Value Date   VD25OH 27 (L) 09/10/2016      Current Medications:  Current Outpatient Medications on File Prior to Visit  Medication Sig Dispense Refill  . acetaminophen (TYLENOL) 500 MG tablet Take 1,000 mg by mouth every 6 (six) hours as needed for mild pain, moderate pain or headache.    . albuterol (PROAIR HFA) 108 (90 Base) MCG/ACT inhaler Inhale 2 puffs into the lungs every 6 (six) hours as needed for wheezing. 3 Inhaler 3  . amitriptyline  (ELAVIL) 10 MG tablet TAKE 1 TO 2 TABLETS BY MOUTH DAILY AT BEDTIME (Patient taking differently: TAKE 10mg  BY MOUTH DAILY AT BEDTIME as needed for sleep) 180 tablet 1  . benzonatate (TESSALON) 100 MG capsule Take 100 mg by mouth 2 (two) times daily as needed for cough.     . chlorpheniramine (CHLOR-TRIMETON) 4 MG tablet Take 4 mg by mouth every 6 (six) hours as needed for allergies.    . Cholecalciferol (VITAMIN D3) 10000 units capsule Take 10,000 Units by mouth daily.    . cyclobenzaprine (FLEXERIL) 10 MG tablet Take 1 tablet (10 mg total) by mouth 2 (two) times daily as needed for muscle spasms. 20 tablet 0  . ESTRACE VAGINAL 0.1 MG/GM vaginal cream Place 1 Applicatorful vaginally 2 (two) times a week. (Patient taking differently: Place 1 Applicatorful vaginally daily as needed. ) 127.5 g 1  . furosemide (LASIX) 40 MG tablet TAKE 1 TABLET EVERY MORNING FOR BLOOD PRESSURE AND FLUID (Patient taking differently: Take 40 mg by mouth daily as needed for edema. ) 90 tablet 1  . losartan (COZAAR) 100 MG tablet Take 1 tablet (100 mg total) by mouth daily. 90 tablet 1  .  metFORMIN (GLUCOPHAGE-XR) 500 MG 24 hr tablet Take 1 to 2 tablets 2 x / day with food for Diabetes 360 tablet 1  . montelukast (SINGULAIR) 10 MG tablet Take 1 tablet (10 mg total) by mouth daily. 90 tablet 3  . NUCYNTA 50 MG TABS tablet TAKE 1 TABLET (50 mg) four times a day as needed for severe headache  0  . pantoprazole (PROTONIX) 40 MG tablet Take 1 tablet (40 mg total) by mouth daily. 90 tablet 1  . ranitidine (ZANTAC) 300 MG tablet TAKE TWICE A DAY WHILE TRYING TO GET OFF PPI (Patient taking differently: Take 300 mg by mouth at bedtime. Takes Pantoprazole in the morning) 180 tablet 3  . sertraline (ZOLOFT) 100 MG tablet Take 1-2 tablets daily for anxiety (Patient taking differently: Take 100 mg by mouth daily. ) 180 tablet 3  . SUMAtriptan (IMITREX) 100 MG tablet Take 1 tablet (100 mg total) by mouth once as needed for migraine. May  repeat in 2 hours if headache persists or recurs. 10 tablet 2  . SYNTHROID 75 MCG tablet TAKE 1 TABLET DAILY 90 tablet 1  . triamcinolone ointment (KENALOG) 0.1 % Apply 1 application topically 2 (two) times daily. (Patient taking differently: Apply 1 application topically as needed. ) 80 g 1   No current facility-administered medications on file prior to visit.    Allergies:  Allergies  Allergen Reactions  . Levaquin [Levofloxacin In D5w] Other (See Comments)    Ankles hurt really bad ? ACHILLES TENDON ?  Marland Kitchen Doxycycline Other (See Comments)    UNSPECIFIED REACTION    Medical History:  She has Cough; Hypertension; GERD (gastroesophageal reflux disease); Anemia; Asthma; Depression; OSA (obstructive sleep apnea); Hyperlipemia; Type 2 diabetes mellitus (HCC); CKD (chronic kidney disease) stage 2, GFR 60-89 ml/min; Sturge-Weber syndrome ([place]); Obesity, morbid ([place]); and Lower back pain on their problem list. Health Maintenance:   Immunization History  Administered Date(s) Administered  . Influenza Split 04/26/2013, 05/29/2015  . Influenza Whole 05/26/2012  . Pneumococcal Polysaccharide-23 09/10/2016  . Td 08/27/2007, 10/22/2017   Tetanus: 2009 DUE Pneumovax: 2018 Prevnar due age 5 Flu vaccine: 2018 at work Zostavax: n/a  LMP:2007 Pap: 2018- Dr. Willis Modena -  MGM:12/2016 - Dr. Willis Modena DEXA:  Colonoscopy: 12/2012 normal - has scheduled for has scheduled for 11/20/2017 EGD: 09/2017 [facility] - report not visible - by Dr. Earlean Shawl, report requested  PFT: 10/2016 CXr 2018 Ct head 04/2015  Last Dental Exam: Dr. Gloriann Loan, 2018 q 6 months.  Last Eye Exam: Dr. Claudean Kinds 09/2017, has cataracts and going next month  NEED REPORTS- requested   Patient Care Team: Unk Pinto, MD as PCP - General (Internal Medicine) Newt Minion, MD as Consulting Physician (Orthopedic Surgery) Love, Alyson Locket, MD as Consulting Physician (Neurology) Ralene Bathe, MD as Consulting Physician  (Ophthalmology) Gastroenterology, Sadie Haber as Consulting Physician (Gastroenterology)  Surgical History:  She has a past surgical history that includes Back surgery; Ankle ganglion cyst excision (Left); birth mark treatments; Breast lumpectomy with radioactive seed localization (Left, 02/02/2016); Breast surgery (Right); Colon surgery (2008); Cholecystectomy (N/A, 02/20/2017); and Cataract extraction (Right, 12/2016). Family History:  Herfamily history includes Allergies in her mother; Asthma in her mother; Brain cancer in her father; Breast cancer in her mother; Colon cancer in her paternal grandmother; Emphysema in her mother; Heart disease in her mother; Hypertension in her brother; Prostate cancer in her father. Social History:  She reports that  has never smoked. she has never used smokeless tobacco. She reports  that she does not drink alcohol or use drugs.  Review of Systems: Review of Systems  Constitutional: Negative for malaise/fatigue and weight loss.  HENT: Negative for hearing loss and tinnitus.   Eyes: Positive for blurred vision (Ongoing issues, bilaterally, followed by Dr. Harlow Mares). Negative for double vision, pain, discharge and redness.  Respiratory: Negative for cough, hemoptysis, sputum production, shortness of breath and wheezing.   Cardiovascular: Negative for chest pain, palpitations, orthopnea, claudication and leg swelling.  Gastrointestinal: Negative for abdominal pain, blood in stool, constipation, diarrhea, heartburn, melena, nausea and vomiting.  Genitourinary: Negative.   Musculoskeletal: Negative for falls, joint pain and myalgias.  Skin: Negative for rash.  Neurological: Positive for headaches (R/t vision issues, following up with Dr. Harlow Mares). Negative for dizziness, tingling, sensory change and weakness.  Endo/Heme/Allergies: Negative for polydipsia.  Psychiatric/Behavioral: Negative for depression, memory loss, substance abuse and suicidal ideas. The patient is  nervous/anxious and has insomnia.   All other systems reviewed and are negative.   Physical Exam: Estimated body mass index is 37.73 kg/m as calculated from the following:   Height as of this encounter: 5\' 3"  (1.6 m).   Weight as of this encounter: 213 lb (96.6 kg). BP 116/84   Pulse 83   Temp 97.9 F (36.6 C)   Ht 5\' 3"  (1.6 m)   Wt 213 lb (96.6 kg)   SpO2 97%   BMI 37.73 kg/m  General Appearance: Well nourished, in no apparent distress.  Eyes: PERRLA, EOMs, conjunctiva no swelling or erythema, normal fundi and vessels.  Sinuses: No Frontal/maxillary tenderness  ENT/Mouth: Ext aud canals clear, normal light reflex with TMs without erythema, bulging. Good dentition. No erythema, swelling, or exudate on post pharynx. Tonsils not swollen or erythematous. Hearing normal.  Neck: Supple, thyroid normal. No bruits  Respiratory: Respiratory effort normal, BS equal bilaterally without rales, rhonchi, wheezing or stridor.  Cardio: RRR without murmurs, rubs or gallops. Brisk peripheral pulses without edema.  Chest: symmetric, with normal excursions and percussion.  Breasts: Defer to GYN Abdomen: Soft, nontender, no guarding, rebound, hernias, masses, or organomegaly.  Lymphatics: Non tender without lymphadenopathy.  Genitourinary: Defer to GYN Musculoskeletal: Full ROM all peripheral extremities,5/5 strength, and normal gait.  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  Neuro: Cranial nerves intact, reflexes equal bilaterally. Normal muscle tone, no cerebellar symptoms. Sensation intact.  Psych: Awake and oriented X 3, normal affect, Insight and Judgment appropriate.   EKG: WNL no ST changes.  Izora Ribas 6:54 PM [facility]

## 2017-10-22 ENCOUNTER — Encounter: Payer: Self-pay | Admitting: Adult Health

## 2017-10-22 ENCOUNTER — Ambulatory Visit (INDEPENDENT_AMBULATORY_CARE_PROVIDER_SITE_OTHER): Payer: 59 | Admitting: Adult Health

## 2017-10-22 VITALS — BP 116/84 | HR 83 | Temp 97.9°F | Ht 63.0 in | Wt 213.0 lb

## 2017-10-22 DIAGNOSIS — Z79899 Other long term (current) drug therapy: Secondary | ICD-10-CM | POA: Diagnosis not present

## 2017-10-22 DIAGNOSIS — N182 Chronic kidney disease, stage 2 (mild): Secondary | ICD-10-CM

## 2017-10-22 DIAGNOSIS — Z0000 Encounter for general adult medical examination without abnormal findings: Secondary | ICD-10-CM

## 2017-10-22 DIAGNOSIS — E785 Hyperlipidemia, unspecified: Secondary | ICD-10-CM

## 2017-10-22 DIAGNOSIS — K2100 Gastro-esophageal reflux disease with esophagitis, without bleeding: Secondary | ICD-10-CM

## 2017-10-22 DIAGNOSIS — Z23 Encounter for immunization: Secondary | ICD-10-CM

## 2017-10-22 DIAGNOSIS — Z136 Encounter for screening for cardiovascular disorders: Secondary | ICD-10-CM | POA: Diagnosis not present

## 2017-10-22 DIAGNOSIS — J45909 Unspecified asthma, uncomplicated: Secondary | ICD-10-CM

## 2017-10-22 DIAGNOSIS — D649 Anemia, unspecified: Secondary | ICD-10-CM

## 2017-10-22 DIAGNOSIS — E1122 Type 2 diabetes mellitus with diabetic chronic kidney disease: Secondary | ICD-10-CM

## 2017-10-22 DIAGNOSIS — I10 Essential (primary) hypertension: Secondary | ICD-10-CM | POA: Diagnosis not present

## 2017-10-22 DIAGNOSIS — Q858 Other phakomatoses, not elsewhere classified: Secondary | ICD-10-CM

## 2017-10-22 DIAGNOSIS — Q8589 Other phakomatoses, not elsewhere classified: Secondary | ICD-10-CM

## 2017-10-22 DIAGNOSIS — G4733 Obstructive sleep apnea (adult) (pediatric): Secondary | ICD-10-CM

## 2017-10-22 DIAGNOSIS — E559 Vitamin D deficiency, unspecified: Secondary | ICD-10-CM

## 2017-10-22 DIAGNOSIS — F32A Depression, unspecified: Secondary | ICD-10-CM

## 2017-10-22 DIAGNOSIS — Z0001 Encounter for general adult medical examination with abnormal findings: Secondary | ICD-10-CM

## 2017-10-22 DIAGNOSIS — F329 Major depressive disorder, single episode, unspecified: Secondary | ICD-10-CM

## 2017-10-22 NOTE — Patient Instructions (Addendum)
Aim for 7+ servings of fruits and vegetables daily  80+ fluid ounces of water or unsweet tea for healthy kidneys  1 drink of alcohol per day  Limit animal fats in diet for cholesterol and heart health - choose grass fed whenever available  Aim for low stress - take time to unwind and care for your mental health  Aim for 150 min of moderate intensity exercise weekly for heart health, and weights twice weekly for bone health  Aim for 7-9 hours of sleep daily   8 Critical Weight-Loss Tips That Aren't Diet and Exercise  1. STARVE THE DISTRACTIONS  All too often when we eat, we're also multitasking: watching TV, answering emails, scrolling through social media. These habits are detrimental to having a strong, clear, healthy relationship with food, and they can hinder our ability to make dietary changes.  In order to truly focus on what you're eating, how much you're eating, why you're eating those specific foods and, most importantly, how those foods make you feel, you need to starve the distractions. That means when you eat, just eat. Focus on your food, the process it went through to end up on your plate, where it came from and how it nourishes you. With this technique, you're more likely to finish a meal feeling satiated.  2.  CONSIDER WHAT YOU'RE NOT WILLING TO DO  This might sound counterintuitive, but it can help provide a "why" when motivation is waning. Declare, in writing, what you are unwilling to do, for example "I am unwilling to be the old dad who cannot play sports with my children".  So consider what you're not willing to accept, write it down, and keep it at the ready.  3.  STOP LABELING FOOD "GOOD" AND "BAD"  You've probably heard someone say they ate something "bad." Maybe you've even said it yourself.  The trouble with 'bad' foods isn't that they'll send you to the grave after a bite or two. The trouble comes when we eat excessive portions of really calorie-dense foods  meal after meal, day after day.  Instead of labeling foods as good or bad, think about which foods you can eat a lot of, and which ones you should just eat a little of. Then, plan ways to eat the foods you really like in portions that fit with your overall goals. A good example of this would be having a slice of pizza alongside a club salad with chicken breast, avocado and a bit of dressing. This is vastly different than 3 slices of pizza, 4 breadsticks with cheese sauce and half of a liter of regular soda.  4.  BRUSH YOUR TEETH AFTER YOU EAT  Getting your mindset in order is important, but sometimes small habits can make a big difference. After eating, you still have the taste of food in their mouth, which often causes people to eat more even if they are full or engage in a nibble or two of dessert.  Brushing your teeth will remove the taste of food from your mouth, and the clean, minty freshness will serve as a cue that mealtime is over.  5.  FOCUS ON CROWDING NOT CUTTING  The most common first step during 'dieting' is to cut. We cut our portion sizes down, we cut out 'bad' foods, we cut out entire food groups. This act of cutting puts [place] and our minds into scarcity mode.  When something is off-limits, even if you're able to avoid it for a while,  you could end up bingeing on it later because you've gone so long without it. So, instead of cutting, focus on crowding. If you crowd your plate and fill it up with more foods like veggies and protein, it simply allows less room for the other stuff. In other words, shift your focus away from what you can't eat, and celebrate the foods that will help you reach your goals.  6.  TAKE TRACKING A STEP FURTHER  Track what you eat, when you ate it, how much you ate and how that food made you feel. Being completely honest with yourself and writing down every single thing that passes through your lips will help you start to notice that maybe you actually do snack,  possibly take in more sugar than you thought, eat when you're bored rather than just hungry or maybe that you have a habit of snacking before bed while watching TV.  The difference from simply tracking your food intake is you're taking into account how food makes you feel, as well as what you're doing while you're eating. This is about becoming more mindful of what, when and why you eat.  7.  PRIORITIZE GOOD SLEEP  One of the strongest risk factors for being overweight is poor sleep. When you're feeling tired, you're more likely to choose unhealthy comfort foods and to skip your workout. Additionally, sleep deprivation may slow down your metabolism. [employer]! Therefore, sleeping 7-8 hours per night can help with weight loss without having to change your diet or increase your physical activity. And if you feel you snore and still wake up tired, talk with me about sleep apnea.  8.  SET ASIDE TIME TO DISCONNECT  Just get out there. Disconnect from the electronics and connect to the elements. Not only will this help reduce stress (a major factor in weight gain) by giving your mind a break from the constant stimulation we've all become so accustomed to, but it may also reprogram your brain to connect with yourself and what you're feeling.

## 2017-10-23 ENCOUNTER — Other Ambulatory Visit: Payer: Self-pay | Admitting: Adult Health

## 2017-10-23 DIAGNOSIS — N182 Chronic kidney disease, stage 2 (mild): Secondary | ICD-10-CM

## 2017-10-23 DIAGNOSIS — E782 Mixed hyperlipidemia: Secondary | ICD-10-CM

## 2017-10-23 LAB — BASIC METABOLIC PANEL WITH GFR
BUN/Creatinine Ratio: 15 (calc) (ref 6–22)
BUN: 18 mg/dL (ref 7–25)
CO2: 30 mmol/L (ref 20–32)
Calcium: 9.2 mg/dL (ref 8.6–10.4)
Chloride: 100 mmol/L (ref 98–110)
Creat: 1.23 mg/dL — ABNORMAL HIGH (ref 0.50–0.99)
GFR, Est African American: 55 mL/min/{1.73_m2} — ABNORMAL LOW (ref >=60)
GFR, Est Non African American: 48 mL/min/{1.73_m2} — ABNORMAL LOW (ref >=60)
Glucose, Bld: 101 mg/dL — ABNORMAL HIGH (ref 65–99)
Potassium: 3.9 mmol/L (ref 3.5–5.3)
Sodium: 140 mmol/L (ref 135–146)

## 2017-10-23 LAB — HEPATIC FUNCTION PANEL
AG Ratio: 1.3 (calc) (ref 1.0–2.5)
ALT: 23 U/L (ref 6–29)
AST: 24 U/L (ref 10–35)
Albumin: 4.1 g/dL (ref 3.6–5.1)
Alkaline phosphatase (APISO): 76 U/L (ref 33–130)
Bilirubin, Direct: 0.1 mg/dL (ref 0.0–0.2)
Globulin: 3.1 g/dL (calc) (ref 1.9–3.7)
Indirect Bilirubin: 0.2 mg/dL (calc) (ref 0.2–1.2)
Total Bilirubin: 0.3 mg/dL (ref 0.2–1.2)
Total Protein: 7.2 g/dL (ref 6.1–8.1)

## 2017-10-23 LAB — URINALYSIS W MICROSCOPIC + REFLEX CULTURE
Bacteria, UA: NONE SEEN /HPF
Bilirubin Urine: NEGATIVE
Glucose, UA: NEGATIVE
Hgb urine dipstick: NEGATIVE
Hyaline Cast: NONE SEEN /LPF
Ketones, ur: NEGATIVE
Leukocyte Esterase: NEGATIVE
Nitrites, Initial: NEGATIVE
Protein, ur: NEGATIVE
RBC / HPF: NONE SEEN /HPF (ref 0–2)
Specific Gravity, Urine: 1.013 (ref 1.001–1.03)
Squamous Epithelial / LPF: NONE SEEN /HPF (ref <=5)
WBC, UA: NONE SEEN /HPF (ref 0–5)
pH: 7.5 (ref 5.0–8.0)

## 2017-10-23 LAB — HEMOGLOBIN A1C
Hgb A1c MFr Bld: 6.4 % of total Hgb — ABNORMAL HIGH (ref <5.7)
Mean Plasma Glucose: 137 (calc)
eAG (mmol/L): 7.6 (calc)

## 2017-10-23 LAB — CBC WITH DIFFERENTIAL/PLATELET
Basophils Absolute: 48 cells/uL (ref 0–200)
Basophils Relative: 0.5 %
Eosinophils Absolute: 346 cells/uL (ref 15–500)
Eosinophils Relative: 3.6 %
HCT: 37.2 % (ref 35.0–45.0)
Hemoglobin: 12 g/dL (ref 11.7–15.5)
Lymphs Abs: 3101 cells/uL (ref 850–3900)
MCH: 25.5 pg — ABNORMAL LOW (ref 27.0–33.0)
MCHC: 32.3 g/dL (ref 32.0–36.0)
MCV: 79 fL — ABNORMAL LOW (ref 80.0–100.0)
MPV: 10.2 fL (ref 7.5–12.5)
Monocytes Relative: 6.3 %
Neutro Abs: 5501 cells/uL (ref 1500–7800)
Neutrophils Relative %: 57.3 %
Platelets: 319 10*3/uL (ref 140–400)
RBC: 4.71 10*6/uL (ref 3.80–5.10)
RDW: 15.2 % — ABNORMAL HIGH (ref 11.0–15.0)
Total Lymphocyte: 32.3 %
WBC mixed population: 605 cells/uL (ref 200–950)
WBC: 9.6 10*3/uL (ref 3.8–10.8)

## 2017-10-23 LAB — IRON, TOTAL/TOTAL IRON BINDING CAP
%SAT: 6 % (calc) — ABNORMAL LOW (ref 11–50)
Iron: 27 ug/dL — ABNORMAL LOW (ref 45–160)
TIBC: 434 mcg/dL (calc) (ref 250–450)

## 2017-10-23 LAB — VITAMIN B12: Vitamin B-12: 487 pg/mL (ref 200–1100)

## 2017-10-23 LAB — LIPID PANEL
Cholesterol: 200 mg/dL — ABNORMAL HIGH (ref <200)
HDL: 54 mg/dL (ref >50)
LDL Cholesterol (Calc): 115 mg/dL (calc) — ABNORMAL HIGH
Non-HDL Cholesterol (Calc): 146 mg/dL (calc) — ABNORMAL HIGH (ref <130)
Total CHOL/HDL Ratio: 3.7 (calc) (ref <5.0)
Triglycerides: 195 mg/dL — ABNORMAL HIGH (ref <150)

## 2017-10-23 LAB — MICROALBUMIN / CREATININE URINE RATIO
Creatinine, Urine: 86 mg/dL (ref 20–275)
Microalb Creat Ratio: 6 mcg/mg creat (ref <30)
Microalb, Ur: 0.5 mg/dL

## 2017-10-23 LAB — VITAMIN D 25 HYDROXY (VIT D DEFICIENCY, FRACTURES): Vit D, 25-Hydroxy: 27 ng/mL — ABNORMAL LOW (ref 30–100)

## 2017-10-23 LAB — NO CULTURE INDICATED

## 2017-10-23 LAB — TSH: TSH: 2.53 mIU/L (ref 0.40–4.50)

## 2017-10-23 MED ORDER — PRAVASTATIN SODIUM 40 MG PO TABS: ORAL_TABLET | ORAL | 1 refills | Status: DC

## 2017-10-28 ENCOUNTER — Encounter: Payer: Self-pay | Admitting: *Deleted

## 2017-10-28 DIAGNOSIS — E119 Type 2 diabetes mellitus without complications: Secondary | ICD-10-CM | POA: Diagnosis not present

## 2017-10-28 DIAGNOSIS — H43811 Vitreous degeneration, right eye: Secondary | ICD-10-CM | POA: Diagnosis not present

## 2017-10-28 LAB — HM DIABETES EYE EXAM

## 2017-10-29 DIAGNOSIS — H26491 Other secondary cataract, right eye: Secondary | ICD-10-CM | POA: Diagnosis not present

## 2017-11-05 DIAGNOSIS — Z130 Encounter for screening for diseases of the blood and blood-forming organs and certain disorders involving the immune mechanism: Secondary | ICD-10-CM | POA: Diagnosis not present

## 2017-11-05 DIAGNOSIS — Z1389 Encounter for screening for other disorder: Secondary | ICD-10-CM | POA: Diagnosis not present

## 2017-11-05 DIAGNOSIS — Z01419 Encounter for gynecological examination (general) (routine) without abnormal findings: Secondary | ICD-10-CM | POA: Diagnosis not present

## 2017-11-10 ENCOUNTER — Encounter: Payer: Self-pay | Admitting: *Deleted

## 2017-11-10 DIAGNOSIS — Z86010 Personal history of colonic polyps: Secondary | ICD-10-CM | POA: Diagnosis not present

## 2017-11-10 DIAGNOSIS — K648 Other hemorrhoids: Secondary | ICD-10-CM | POA: Diagnosis not present

## 2017-11-10 DIAGNOSIS — Z860101 Personal history of adenomatous and serrated colon polyps: Secondary | ICD-10-CM | POA: Insufficient documentation

## 2017-11-10 DIAGNOSIS — K5730 Diverticulosis of large intestine without perforation or abscess without bleeding: Secondary | ICD-10-CM | POA: Diagnosis not present

## 2017-11-10 DIAGNOSIS — Z1211 Encounter for screening for malignant neoplasm of colon: Secondary | ICD-10-CM | POA: Diagnosis not present

## 2017-11-10 LAB — HM COLONOSCOPY

## 2017-11-12 DIAGNOSIS — D485 Neoplasm of uncertain behavior of skin: Secondary | ICD-10-CM | POA: Diagnosis not present

## 2017-11-12 DIAGNOSIS — D481 Neoplasm of uncertain behavior of connective and other soft tissue: Secondary | ICD-10-CM | POA: Diagnosis not present

## 2017-11-17 ENCOUNTER — Telehealth: Payer: Self-pay | Admitting: Physician Assistant

## 2017-11-17 ENCOUNTER — Other Ambulatory Visit: Payer: Self-pay

## 2017-11-17 MED ORDER — MONTELUKAST SODIUM 10 MG PO TABS: 10.0000 mg | ORAL_TABLET | Freq: Every day | ORAL | 3 refills | Status: DC

## 2017-11-17 MED ORDER — SERTRALINE HCL 100 MG PO TABS: ORAL_TABLET | ORAL | 0 refills | Status: DC

## 2017-11-17 MED ORDER — MONTELUKAST SODIUM 10 MG PO TABS: 10.0000 mg | ORAL_TABLET | Freq: Every day | ORAL | 1 refills | Status: DC

## 2017-11-17 NOTE — Telephone Encounter (Signed)
Refilled on March (Zoloft) 25th 2019 by DD

## 2017-11-17 NOTE — Telephone Encounter (Signed)
Requesting 90 supply of Zoloft to [facility] [id number]

## 2017-11-20 ENCOUNTER — Other Ambulatory Visit: Payer: 59

## 2017-11-20 DIAGNOSIS — N182 Chronic kidney disease, stage 2 (mild): Secondary | ICD-10-CM

## 2017-11-20 LAB — BASIC METABOLIC PANEL WITH GFR
BUN: 15 mg/dL (ref 7–25)
CO2: 27 mmol/L (ref 20–32)
Calcium: 9.3 mg/dL (ref 8.6–10.4)
Chloride: 105 mmol/L (ref 98–110)
Creat: 0.71 mg/dL (ref 0.50–0.99)
GFR, Est African American: 107 mL/min/{1.73_m2} (ref >=60)
GFR, Est Non African American: 93 mL/min/{1.73_m2} (ref >=60)
Glucose, Bld: 86 mg/dL (ref 65–99)
Potassium: 4.4 mmol/L (ref 3.5–5.3)
Sodium: 138 mmol/L (ref 135–146)

## 2017-12-08 ENCOUNTER — Other Ambulatory Visit: Payer: Self-pay | Admitting: Internal Medicine

## 2017-12-16 ENCOUNTER — Telehealth: Payer: Self-pay | Admitting: Physician Assistant

## 2017-12-16 MED ORDER — ATORVASTATIN CALCIUM 20 MG PO TABS: ORAL_TABLET | ORAL | 11 refills | Status: DC

## 2017-12-16 NOTE — Telephone Encounter (Signed)
LVM to inform pt to check her MyChart: see previous note. April 23rd 2019

## 2017-12-16 NOTE — Telephone Encounter (Signed)
Stop the pravastatin for 2 weeks, then start on lipitor 20mg  to take 3 days a week.  With your sugars and your cholesterol it is very important for [place] to have you on something.   Statin therapy  In addition to the known lipid-lowering effects, statins are now widely accepted to have anti-inflammatory and immunomodulatory effects. Adjunctive use of statins has proven beneficial in the context of a wide range of inflammatory diseases, including rheumatoid arthritis.  Research has shown that the salutary effect of statins on cholesterol may not be their only benefit. Statin therapy has shown promise for everything from fighting viral infections to protecting the eye from cataracts.  A 2005 study of more than 700 hospital patients being treated for pneumonia found that the death rate was more than twice as high among those who were not using statins. In 2006, a [place] study examined the rate of sepsis, a deadly blood infection, among patients who had been hospitalized for heart events. In the two years after their hospitalization, the statin users had a rate of sepsis 19% lower than that of the non-statin users. A 2009 review of 22 studies found that statins appeared to have a beneficial effect on the outcome of infection, but they couldn't come to a firm conclusion.

## 2017-12-18 ENCOUNTER — Telehealth: Payer: Self-pay | Admitting: Physician Assistant

## 2017-12-18 NOTE — Telephone Encounter (Signed)
Patient had abnormal thyroid at work, 5.87.   Please make sure she is on 71mcg daily.   Please make sure you are taking your thyroid medication 60 mins before food with just water. Do not take it within 4 hours of calcium, magnesium, stomach medications like tums, zantac, prilosec for example.   If she is taking it like this please take 45mcg daily but 1.5 pills on Tues and Thurs Recheck OV 1 month.   However patient asked if she should see an endocrinologist, we can management thyroid here but if she wishes for a second opinion we can send a referral.   Estill Bamberg

## 2017-12-18 NOTE — Telephone Encounter (Signed)
Pt reports that she will check the MyChart message but I also went over it with her and she has not be taking it the way she should. Pt was not aware that she had to not take: Calcium, Mag and the other stomach meds but she is aware now and plans to follow instructions in hopes that this will help with her thyroid. Again pt voiced understanding & hung up.

## 2017-12-25 ENCOUNTER — Other Ambulatory Visit: Payer: Self-pay | Admitting: Internal Medicine

## 2018-01-06 ENCOUNTER — Telehealth: Payer: Self-pay

## 2018-01-06 ENCOUNTER — Other Ambulatory Visit: Payer: Self-pay | Admitting: Physician Assistant

## 2018-01-06 MED ORDER — ROSUVASTATIN CALCIUM 10 MG PO TABS: ORAL_TABLET | ORAL | 0 refills | Status: DC

## 2018-01-06 NOTE — Telephone Encounter (Signed)
Pt reports LIPITOR makes her legs hurt worse than the last medicine. And would like something else to try.  Per provider stop the LIPITOR for 2wks & then try CRESTOR that has been sent to her pharmacy. Pt voiced understanding & hung up. May 14th 2019 by DD

## 2018-01-07 DIAGNOSIS — Z1231 Encounter for screening mammogram for malignant neoplasm of breast: Secondary | ICD-10-CM | POA: Diagnosis not present

## 2018-01-07 LAB — HM MAMMOGRAPHY

## 2018-01-08 ENCOUNTER — Other Ambulatory Visit: Payer: Self-pay | Admitting: Plastic Surgery

## 2018-01-08 DIAGNOSIS — D485 Neoplasm of uncertain behavior of skin: Secondary | ICD-10-CM | POA: Diagnosis not present

## 2018-01-08 DIAGNOSIS — Q825 Congenital non-neoplastic nevus: Secondary | ICD-10-CM | POA: Diagnosis not present

## 2018-01-08 DIAGNOSIS — L988 Other specified disorders of the skin and subcutaneous tissue: Secondary | ICD-10-CM | POA: Diagnosis not present

## 2018-01-08 DIAGNOSIS — Q279 Congenital malformation of peripheral vascular system, unspecified: Secondary | ICD-10-CM | POA: Diagnosis not present

## 2018-01-08 DIAGNOSIS — L570 Actinic keratosis: Secondary | ICD-10-CM | POA: Diagnosis not present

## 2018-01-12 ENCOUNTER — Other Ambulatory Visit: Payer: Self-pay | Admitting: Adult Health

## 2018-01-12 ENCOUNTER — Telehealth: Payer: Self-pay | Admitting: Internal Medicine

## 2018-01-12 MED ORDER — PREDNISONE 20 MG PO TABS: ORAL_TABLET | ORAL | 0 refills | Status: DC

## 2018-01-12 MED ORDER — PROMETHAZINE-DM 6.25-15 MG/5ML PO SYRP: 5.0000 mL | ORAL_SOLUTION | Freq: Four times a day (QID) | ORAL | 1 refills | Status: DC | PRN

## 2018-01-12 MED ORDER — AZITHROMYCIN 250 MG PO TABS: ORAL_TABLET | ORAL | 1 refills | Status: AC

## 2018-01-12 NOTE — Telephone Encounter (Signed)
Left detailed message about prescription being sent in to the pharmacy

## 2018-01-12 NOTE — Telephone Encounter (Signed)
Sore Throat, right lung pain, coughing up [place] mucus since Saturday. Would you call in something, I get this every year. [facility].

## 2018-01-13 ENCOUNTER — Encounter: Payer: Self-pay | Admitting: *Deleted

## 2018-01-15 ENCOUNTER — Telehealth: Payer: Self-pay | Admitting: *Deleted

## 2018-01-15 NOTE — Telephone Encounter (Signed)
Patient said she has been sick all wk & asking if she could get a order & go get a CXR per dr Crissie Sickles she will have to be seen 1st.  Advised pt & said she will give the meds a little more time to work

## 2018-01-26 ENCOUNTER — Ambulatory Visit: Payer: Self-pay | Admitting: Adult Health

## 2018-01-26 ENCOUNTER — Emergency Department (HOSPITAL_COMMUNITY): Payer: 59

## 2018-01-26 ENCOUNTER — Other Ambulatory Visit: Payer: Self-pay

## 2018-01-26 ENCOUNTER — Encounter (HOSPITAL_COMMUNITY): Payer: Self-pay | Admitting: Emergency Medicine

## 2018-01-26 ENCOUNTER — Emergency Department (HOSPITAL_COMMUNITY)
Admission: EM | Admit: 2018-01-26 | Discharge: 2018-01-26 | Disposition: A | Discharge status: Home / Self Care | Payer: 59 | Attending: Emergency Medicine | Admitting: Emergency Medicine

## 2018-01-26 DIAGNOSIS — Y9389 Activity, other specified: Secondary | ICD-10-CM | POA: Insufficient documentation

## 2018-01-26 DIAGNOSIS — R55 Syncope and collapse: Secondary | ICD-10-CM | POA: Insufficient documentation

## 2018-01-26 DIAGNOSIS — J45909 Unspecified asthma, uncomplicated: Secondary | ICD-10-CM | POA: Insufficient documentation

## 2018-01-26 DIAGNOSIS — S0083XA Contusion of other part of head, initial encounter: Secondary | ICD-10-CM | POA: Diagnosis not present

## 2018-01-26 DIAGNOSIS — Y929 Unspecified place or not applicable: Secondary | ICD-10-CM | POA: Diagnosis not present

## 2018-01-26 DIAGNOSIS — S161XXA Strain of muscle, fascia and tendon at neck level, initial encounter: Secondary | ICD-10-CM

## 2018-01-26 DIAGNOSIS — K5904 Chronic idiopathic constipation: Secondary | ICD-10-CM | POA: Insufficient documentation

## 2018-01-26 DIAGNOSIS — I129 Hypertensive chronic kidney disease with stage 1 through stage 4 chronic kidney disease, or unspecified chronic kidney disease: Secondary | ICD-10-CM | POA: Insufficient documentation

## 2018-01-26 DIAGNOSIS — E039 Hypothyroidism, unspecified: Secondary | ICD-10-CM | POA: Diagnosis not present

## 2018-01-26 DIAGNOSIS — Y999 Unspecified external cause status: Secondary | ICD-10-CM | POA: Diagnosis not present

## 2018-01-26 DIAGNOSIS — Z7984 Long term (current) use of oral hypoglycemic drugs: Secondary | ICD-10-CM | POA: Diagnosis not present

## 2018-01-26 DIAGNOSIS — E1122 Type 2 diabetes mellitus with diabetic chronic kidney disease: Secondary | ICD-10-CM | POA: Insufficient documentation

## 2018-01-26 DIAGNOSIS — S098XXA Other specified injuries of head, initial encounter: Secondary | ICD-10-CM | POA: Diagnosis present

## 2018-01-26 DIAGNOSIS — W1839XA Other fall on same level, initial encounter: Secondary | ICD-10-CM | POA: Insufficient documentation

## 2018-01-26 DIAGNOSIS — Z79899 Other long term (current) drug therapy: Secondary | ICD-10-CM | POA: Diagnosis not present

## 2018-01-26 DIAGNOSIS — N182 Chronic kidney disease, stage 2 (mild): Secondary | ICD-10-CM | POA: Diagnosis not present

## 2018-01-26 DIAGNOSIS — S0003XA Contusion of scalp, initial encounter: Secondary | ICD-10-CM | POA: Insufficient documentation

## 2018-01-26 LAB — URINALYSIS, ROUTINE W REFLEX MICROSCOPIC
Bilirubin Urine: NEGATIVE
Glucose, UA: NEGATIVE mg/dL
Hgb urine dipstick: NEGATIVE
Ketones, ur: NEGATIVE mg/dL
Leukocytes, UA: NEGATIVE
Nitrite: NEGATIVE
Protein, ur: NEGATIVE mg/dL
Specific Gravity, Urine: 1.017 (ref 1.005–1.030)
pH: 6 (ref 5.0–8.0)

## 2018-01-26 LAB — CBC
HCT: 37.8 % (ref 36.0–46.0)
Hemoglobin: 11.5 g/dL — ABNORMAL LOW (ref 12.0–15.0)
MCH: 25.1 pg — ABNORMAL LOW (ref 26.0–34.0)
MCHC: 30.4 g/dL (ref 30.0–36.0)
MCV: 82.4 fL (ref 78.0–100.0)
Platelets: 271 10*3/uL (ref 150–400)
RBC: 4.59 MIL/uL (ref 3.87–5.11)
RDW: 15.9 % — ABNORMAL HIGH (ref 11.5–15.5)
WBC: 11.1 10*3/uL — ABNORMAL HIGH (ref 4.0–10.5)

## 2018-01-26 LAB — DIFFERENTIAL
Abs Immature Granulocytes: 0.1 10*3/uL (ref 0.0–0.1)
Basophils Absolute: 0 10*3/uL (ref 0.0–0.1)
Basophils Relative: 0 %
Eosinophils Absolute: 0.5 10*3/uL (ref 0.0–0.7)
Eosinophils Relative: 4 %
Immature Granulocytes: 1 %
Lymphocytes Relative: 21 %
Lymphs Abs: 2.3 10*3/uL (ref 0.7–4.0)
Monocytes Absolute: 0.5 10*3/uL (ref 0.1–1.0)
Monocytes Relative: 5 %
Neutro Abs: 7.3 10*3/uL (ref 1.7–7.7)
Neutrophils Relative %: 69 %

## 2018-01-26 LAB — BASIC METABOLIC PANEL
Anion gap: 8 (ref 5–15)
BUN: 13 mg/dL (ref 6–20)
CO2: 25 mmol/L (ref 22–32)
Calcium: 8.7 mg/dL — ABNORMAL LOW (ref 8.9–10.3)
Chloride: 108 mmol/L (ref 101–111)
Creatinine, Ser: 0.91 mg/dL (ref 0.44–1.00)
GFR calc Af Amer: >60 mL/min (ref >60)
GFR calc non Af Amer: >60 mL/min (ref >60)
Glucose, Bld: 144 mg/dL — ABNORMAL HIGH (ref 65–99)
Potassium: 4.2 mmol/L (ref 3.5–5.1)
Sodium: 141 mmol/L (ref 135–145)

## 2018-01-26 LAB — CBG MONITORING, ED: Glucose-Capillary: 133 mg/dL — ABNORMAL HIGH (ref 65–99)

## 2018-01-26 LAB — TROPONIN I: Troponin I: <0.03 ng/mL (ref <0.03)

## 2018-01-26 MED ORDER — HYDROCODONE-ACETAMINOPHEN 5-325 MG PO TABS: 1.0000 | ORAL_TABLET | ORAL | 0 refills | Status: DC | PRN

## 2018-01-26 MED ORDER — TRAMADOL HCL 50 MG PO TABS
50.0000 mg | ORAL_TABLET | Freq: Once | ORAL | Status: AC
  Administered 2018-01-26: 50 mg via ORAL
  Filled 2018-01-26: qty 1

## 2018-01-26 MED ORDER — PEG 3350-KCL-NABCB-NACL-NASULF 236 G PO SOLR: 4.0000 L | Freq: Once | ORAL | 0 refills | Status: AC

## 2018-01-26 NOTE — ED Provider Notes (Signed)
[facility] EMERGENCY DEPARTMENT Provider Note   CSN: [id number] Arrival date & time: 01/26/18  0426     History   Chief Complaint Chief Complaint  Patient presents with  . Loss of Consciousness    HPI Kristina Webb is a 62 y.o. female.  The history is provided by the patient.  She has history of hypertension, hyperlipidemia, obstructive sleep apnea, prediabetes, asthma and comes in following a syncopal episode.  She has been constipated and took a dose of a women's laxative.  She was on the commode trying to move her bowels, when she developed some sharp, crampy pain.  She started feeling nauseated and dizzy.  She then passed out and struck her head as she fell.  Loss of consciousness was brief.  She was diaphoretic.  She is complaining of pain in her head and in her neck.  Pain is rated at 4/10.  Her arms feel heavy, but there is no numbness or tingling.  She denied chest pain, heaviness, tightness, pressure.  She denied any palpitations.  Past Medical History:  Diagnosis Date  . Anemia    hx  . Anxiety   . Arthritis    back  . Asthma   . Depression   . Deviated septum   . GERD (gastroesophageal reflux disease)   . Headache   . History of hiatal hernia   . Hyperlipemia   . Hypertension   . Hypothyroidism   . OSA (obstructive sleep apnea)    does not use -last 6 months  . Pre-diabetes    per pt    Patient Active Problem List   Diagnosis Date Noted  . Lower back pain 07/17/2015  . 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 Obesity, morbid ([place]) 03/29/2015  . CKD (chronic kidney disease) stage 2, GFR 60-89 ml/min 04/15/2014  . Hyperlipemia   . Type 2 diabetes mellitus ([place])   . Anemia   . Asthma   . Depression   . OSA (obstructive sleep apnea)   . Hypertension   . GERD (gastroesophageal reflux disease)   . Cough 12/28/2012    Past Surgical History:  Procedure Laterality Date  . ANKLE GANGLION CYST EXCISION Left   . BACK SURGERY    .  birth mark treatments    . BREAST LUMPECTOMY WITH RADIOACTIVE SEED LOCALIZATION Left 02/02/2016   Procedure: LEFT BREAST LUMPECTOMY WITH RADIOACTIVE SEED LOCALIZATION;  Surgeon: Autumn Messing III, MD;  Location: [facility];  Service: General;  Laterality: Left;  . BREAST SURGERY Right    papilloma removed,cyst lft  . CATARACT EXTRACTION Right 12/2016   Dr. Harlow Mares  . CHOLECYSTECTOMY N/A 02/20/2017   Procedure: LAPAROSCOPIC CHOLECYSTECTOMY WITH INTRAOPERATIVE CHOLANGIOGRAM;  Surgeon: Jovita Kussmaul, MD;  Location: [facility];  Service: General;  Laterality: N/A;  . COLON SURGERY  2008   resection- endometriosis     OB History   None      Home Medications    Prior to Admission medications   Medication Sig Start Date End Date Taking? Authorizing Provider  acetaminophen (TYLENOL) 500 MG tablet Take 1,000 mg by mouth every 6 (six) hours as needed for mild pain, moderate pain or headache.    [provider]  albuterol (PROAIR HFA) 108 (90 Base) MCG/ACT inhaler Inhale 2 puffs into the lungs every 6 (six) hours as needed for wheezing. 09/10/16   Vicie Mutters, PA-C  amitriptyline (ELAVIL) 10 MG tablet TAKE 1 TO 2 TABLETS BY MOUTH DAILY AT BEDTIME Patient taking  differently: TAKE 10mg  BY MOUTH DAILY AT BEDTIME as needed for sleep 11/16/15   Unk Pinto, MD  benzonatate (TESSALON) 100 MG capsule Take 100 mg by mouth 2 (two) times daily as needed for cough.  09/04/16   [provider]  chlorpheniramine (CHLOR-TRIMETON) 4 MG tablet Take 4 mg by mouth every 6 (six) hours as needed for allergies.    [provider]  Cholecalciferol (VITAMIN D3) 10000 units capsule Take 10,000 Units by mouth daily.    [provider]  cyclobenzaprine (FLEXERIL) 10 MG tablet Take 1 tablet (10 mg total) by mouth 2 (two) times daily as needed for muscle spasms. 06/06/17   Nat Christen, MD  ESTRACE VAGINAL 0.1 MG/GM vaginal cream Place 1 Applicatorful vaginally 2 (two) times a week. Patient taking  differently: Place 1 Applicatorful vaginally daily as needed.  08/29/16   Unk Pinto, MD  furosemide (LASIX) 40 MG tablet TAKE 1 TABLET EVERY MORNING FOR BLOOD PRESSURE AND FLUID Patient taking differently: Take 40 mg by mouth daily as needed for edema.  08/29/16   Unk Pinto, MD  losartan (COZAAR) 100 MG tablet TAKE 1 TABLET BY MOUTH EVERY DAY 12/08/17   Unk Pinto, MD  metFORMIN (GLUCOPHAGE-XR) 500 MG 24 hr tablet Take 1 to 2 tablets 2 x / day with food for Diabetes 08/01/17 01/30/18  Unk Pinto, MD  montelukast (SINGULAIR) 10 MG tablet Take 1 tablet (10 mg total) by mouth daily. 11/17/17   Vicie Mutters, PA-C  NUCYNTA 50 MG TABS tablet TAKE 1 TABLET (50 mg) four times a day as needed for severe headache 02/10/15   [provider]  pantoprazole (PROTONIX) 40 MG tablet Take 1 tablet (40 mg total) by mouth daily. 09/25/17   Vicie Mutters, PA-C  predniSONE (DELTASONE) 20 MG tablet 2 tablets daily for 3 days, 1 tablet daily for 4 days. 01/12/18   Liane Comber, NP  promethazine-dextromethorphan (PROMETHAZINE-DM) 6.25-15 MG/5ML syrup Take 5 mLs by mouth 4 (four) times daily as needed for cough. 01/12/18   Liane Comber, NP  ranitidine (ZANTAC) 300 MG tablet TAKE TWICE A DAY WHILE TRYING TO GET OFF PPI Patient taking differently: Take 300 mg by mouth at bedtime. Takes Pantoprazole in the morning 09/03/16   Unk Pinto, MD  rosuvastatin (CRESTOR) 10 MG tablet Take 1 tablet 2-3 days a week, stop the lipitor 01/06/18   Vicie Mutters, PA-C  sertraline (ZOLOFT) 100 MG tablet Take 1-2 tablets daily for anxiety 11/17/17   Vicie Mutters, PA-C  SUMAtriptan (IMITREX) 100 MG tablet Take 1 tablet (100 mg total) by mouth once as needed for migraine. May repeat in 2 hours if headache persists or recurs. 07/05/15   Dohmeier, Asencion Partridge, MD  SYNTHROID 75 MCG tablet TAKE 1 TABLET DAILY 12/25/17   Unk Pinto, MD  triamcinolone ointment (KENALOG) 0.1 % Apply 1 application topically 2 (two)  times daily. Patient taking differently: Apply 1 application topically as needed.  03/10/17   Vicie Mutters, PA-C    Family History Family History  Problem Relation Age of Onset  . Emphysema Mother        smoked  . Allergies Mother   . Asthma Mother   . Heart disease Mother   . Breast cancer Mother        with mets to Bone  . Prostate cancer Father   . Brain cancer Father   . Hypertension Brother   . Colon cancer Paternal Grandmother   . Allergic rhinitis Neg Hx   . Angioedema Neg  Hx   . Eczema Neg Hx   . Immunodeficiency Neg Hx   . Urticaria Neg Hx     Social History Social History   Tobacco Use  . Smoking status: Never Smoker  . Smokeless tobacco: Never Used  . Tobacco comment: husband smokes in the house  Substance Use Topics  . Alcohol use: No  . Drug use: No     Allergies   Levaquin [levofloxacin in d5w] and Doxycycline   Review of Systems Review of Systems  All other systems reviewed and are negative.    Physical Exam Updated Vital Signs BP 129/75 (BP Location: Right Arm)   Pulse 76   Temp 97.6 F (36.4 C) (Oral)   Resp 18   Ht 5\' 3"  (1.6 m)   Wt 90.7 kg (200 lb)   SpO2 98%   BMI 35.43 kg/m   Physical Exam  Nursing note and vitals reviewed.  62 year old female, resting comfortably and in no acute distress. Vital signs are normal. Oxygen saturation is 98%, which is normal. Head is normocephalic.  Port-wine birthmark is present on the right malar area.  Small hematoma present in the midline in the forehead, and in the left frontal area. PERRLA, EOMI. Oropharynx is clear. Neck is immobilized in a stiff cervical collar and is tender in the upper cervical region without adenopathy or JVD. Back is nontender and there is no CVA tenderness. Lungs are clear without rales, wheezes, or rhonchi. Chest is nontender. Heart has regular rate and rhythm without murmur. Abdomen is soft, flat, nontender without masses or hepatosplenomegaly and peristalsis is  normoactive. Extremities have no cyanosis or edema, full range of motion is present. Skin is warm and dry without rash. Neurologic: Mental status is normal, cranial nerves are intact, there are no motor or sensory deficits.  Grip strength is strong and equal and elbow extension and flexion is 5/5 bilaterally.  ED Treatments / Results  Labs (all labs ordered are listed, but only abnormal results are displayed) Labs Reviewed  BASIC METABOLIC PANEL - Abnormal; Notable for the following components:      Result Value   Glucose, Bld 144 (*)    Calcium 8.7 (*)    All other components within normal limits  CBC - Abnormal; Notable for the following components:   WBC 11.1 (*)    Hemoglobin 11.5 (*)    MCH 25.1 (*)    RDW 15.9 (*)    All other components within normal limits  CBG MONITORING, ED - Abnormal; Notable for the following components:   Glucose-Capillary 133 (*)    All other components within normal limits  TROPONIN I  DIFFERENTIAL  URINALYSIS, ROUTINE W REFLEX MICROSCOPIC    EKG EKG Interpretation  Date/Time:  Monday January 26 2018 04:35:29 EDT Ventricular Rate:  73 PR Interval:  168 QRS Duration: 114 QT Interval:  410 QTC Calculation: 451 R Axis:   61 Text Interpretation:  Normal sinus rhythm Normal ECG When compared with ECG of 04/02/2017, HEART RATE has decreased Confirmed by Delora Fuel ([id number]) on 01/26/2018 4:52:30 AM   Radiology Ct Head Wo Contrast  Result Date: 01/26/2018 CLINICAL DATA:  Syncopal episode in bathroom. History of hypertension, hyperlipidemia and headache. EXAM: CT HEAD WITHOUT CONTRAST CT CERVICAL SPINE WITHOUT CONTRAST TECHNIQUE: Multidetector CT imaging of the head and cervical spine was performed following the standard protocol without intravenous contrast. Multiplanar CT image reconstructions of the cervical spine were also generated. COMPARISON:  None. CT HEAD and cervical spine  June 06, 2017 FINDINGS: CT HEAD FINDINGS BRAIN: No intraparenchymal  hemorrhage, mass effect nor midline shift. Mild parenchymal brain volume loss. No acute large vascular territory infarcts. No abnormal extra-axial fluid collections. Basal cisterns are patent. VASCULAR: Trace calcific atherosclerosis of the carotid siphons. SKULL: No skull fracture. No significant scalp soft tissue swelling. SINUSES/ORBITS: The mastoid air-cells and included paranasal sinuses are well-aerated.The included ocular globes and orbital contents are non-suspicious. Status post RIGHT ocular lens implant. OTHER: Asymmetrically prominent RIGHT facial fat, superimposed RIGHT periorbital and RIGHT frontal small scalp hematoma. No subcutaneous gas or radiopaque foreign bodies. CT CERVICAL SPINE FINDINGS ALIGNMENT: Straightened lordosis.  Vertebral bodies in alignment. SKULL BASE AND VERTEBRAE: Cervical vertebral bodies and posterior elements are intact. Moderate C4-5 through C6-7 disc height loss and endplate spurring compatible with degenerative discs. No destructive bony lesions. C1-2 articulation maintained. SOFT TISSUES AND SPINAL CANAL: Nonacute. Calcified stylohyoid ligament. DISC LEVELS: No significant osseous canal stenosis. Moderate bilateral C4-5, mild RIGHT C6-7 neural foraminal narrowing. UPPER CHEST: Lung apices are clear. OTHER: None. IMPRESSION: CT HEAD: 1. No acute intracranial process. Small RIGHT frontal and periorbital scalp hematomas. No postseptal hematoma. 2. Stable examination including mild parenchymal brain volume loss. CT CERVICAL SPINE: 1. No acute fracture or malalignment. 2. Moderate C4-5 and C6-7 neural foraminal narrowing. Electronically Signed   By: Elon Alas M.D.   On: 01/26/2018 06:28   Ct Cervical Spine Wo Contrast  Result Date: 01/26/2018 CLINICAL DATA:  Syncopal episode in bathroom. History of hypertension, hyperlipidemia and headache. EXAM: CT HEAD WITHOUT CONTRAST CT CERVICAL SPINE WITHOUT CONTRAST TECHNIQUE: Multidetector CT imaging of the head and cervical  spine was performed following the standard protocol without intravenous contrast. Multiplanar CT image reconstructions of the cervical spine were also generated. COMPARISON:  None. CT HEAD and cervical spine June 06, 2017 FINDINGS: CT HEAD FINDINGS BRAIN: No intraparenchymal hemorrhage, mass effect nor midline shift. Mild parenchymal brain volume loss. No acute large vascular territory infarcts. No abnormal extra-axial fluid collections. Basal cisterns are patent. VASCULAR: Trace calcific atherosclerosis of the carotid siphons. SKULL: No skull fracture. No significant scalp soft tissue swelling. SINUSES/ORBITS: The mastoid air-cells and included paranasal sinuses are well-aerated.The included ocular globes and orbital contents are non-suspicious. Status post RIGHT ocular lens implant. OTHER: Asymmetrically prominent RIGHT facial fat, superimposed RIGHT periorbital and RIGHT frontal small scalp hematoma. No subcutaneous gas or radiopaque foreign bodies. CT CERVICAL SPINE FINDINGS ALIGNMENT: Straightened lordosis.  Vertebral bodies in alignment. SKULL BASE AND VERTEBRAE: Cervical vertebral bodies and posterior elements are intact. Moderate C4-5 through C6-7 disc height loss and endplate spurring compatible with degenerative discs. No destructive bony lesions. C1-2 articulation maintained. SOFT TISSUES AND SPINAL CANAL: Nonacute. Calcified stylohyoid ligament. DISC LEVELS: No significant osseous canal stenosis. Moderate bilateral C4-5, mild RIGHT C6-7 neural foraminal narrowing. UPPER CHEST: Lung apices are clear. OTHER: None. IMPRESSION: CT HEAD: 1. No acute intracranial process. Small RIGHT frontal and periorbital scalp hematomas. No postseptal hematoma. 2. Stable examination including mild parenchymal brain volume loss. CT CERVICAL SPINE: 1. No acute fracture or malalignment. 2. Moderate C4-5 and C6-7 neural foraminal narrowing. Electronically Signed   By: Elon Alas M.D.   On: 01/26/2018 06:28     Procedures Procedures   Medications Ordered in ED Medications  traMADol (ULTRAM) tablet 50 mg (50 mg Oral Given 01/26/18 0829)     Initial Impression / Assessment and Plan / ED Course  I have reviewed the triage vital signs and the nursing notes.  Pertinent labs &  imaging results that were available during my care of the patient were reviewed by me and considered in my medical decision making (see chart for details).  Constipation with cramping secondary to laxative use-probable phenolphthalein containing laxative.  Syncope sounds vasovagal.  Low index of suspicion for cardiac syncope.  Because of head and neck trauma she is being sent for CT scans.  Screening labs also being obtained.  ECG shows no acute changes.  CT scans show no intracranial injury, no C-spine fracture.  Laboratory work-up is unremarkable.  No indication that syncope was anything other than vasovagal secondary to pain.  She is discharged with instructions to apply ice, use over-the-counter analgesics as needed for pain.  Given prescription for hydrocodone-acetaminophen for pain not adequately controlled with over-the-counter medications.  Advised to go on a high-fiber diet for constipation, he is MiraLAX as needed.  Given prescription for GoLYTELY to get adequate initial relief of constipation.  Return precautions discussed.  Final Clinical Impressions(s) / ED Diagnoses   Final diagnoses:  Vasovagal syncope  Chronic idiopathic constipation  Contusion of forehead, initial encounter  Contusion of scalp, initial encounter  Cervical strain, initial encounter    ED Discharge Orders        Ordered    HYDROcodone-acetaminophen (NORCO) 5-325 MG tablet  Every 4 hours PRN     01/26/18 0813    polyethylene glycol (GOLYTELY) 236 g solution   Once     11/91/47 8295       Delora Fuel, MD 62/13/08 [phone]

## 2018-01-26 NOTE — ED Notes (Signed)
Patient was clear for CT by another staff member will collect labs work when patient returns,
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
## 2018-01-26 NOTE — ED Notes (Signed)
Pt stable, ambulatory, and verbalizes understanding of d/c instructions.  

## 2018-01-26 NOTE — ED Notes (Signed)
Admitting at bedside 

## 2018-01-26 NOTE — ED Triage Notes (Signed)
Pt arrived via POV c/o neck and head pain after syncopal episode while attempting to have a bowel movement, pt states she had a sharp pain in her abdomen then woke up face down in bathroom floor.

## 2018-01-26 NOTE — ED Notes (Signed)
Rigid collar placed on patient in triage

## 2018-01-26 NOTE — Discharge Instructions (Signed)
Apply ice several times a day.  Take ibuprofen or acetaminophen as needed for less severe pain.  Increase your fiber intake until you are having regular bowel movements. It is also acceptable to increase your Miralax dose until you are getting satisfactory results.

## 2018-01-27 DIAGNOSIS — M9901 Segmental and somatic dysfunction of cervical region: Secondary | ICD-10-CM | POA: Diagnosis not present

## 2018-01-27 DIAGNOSIS — M5136 Other intervertebral disc degeneration, lumbar region: Secondary | ICD-10-CM | POA: Diagnosis not present

## 2018-01-27 DIAGNOSIS — M9903 Segmental and somatic dysfunction of lumbar region: Secondary | ICD-10-CM | POA: Diagnosis not present

## 2018-01-28 DIAGNOSIS — M5136 Other intervertebral disc degeneration, lumbar region: Secondary | ICD-10-CM | POA: Diagnosis not present

## 2018-01-28 DIAGNOSIS — M9903 Segmental and somatic dysfunction of lumbar region: Secondary | ICD-10-CM | POA: Diagnosis not present

## 2018-01-28 DIAGNOSIS — M9901 Segmental and somatic dysfunction of cervical region: Secondary | ICD-10-CM | POA: Diagnosis not present

## 2018-01-29 DIAGNOSIS — M5136 Other intervertebral disc degeneration, lumbar region: Secondary | ICD-10-CM | POA: Diagnosis not present

## 2018-01-29 DIAGNOSIS — M9901 Segmental and somatic dysfunction of cervical region: Secondary | ICD-10-CM | POA: Diagnosis not present

## 2018-01-29 DIAGNOSIS — M9903 Segmental and somatic dysfunction of lumbar region: Secondary | ICD-10-CM | POA: Diagnosis not present

## 2018-02-02 DIAGNOSIS — M5136 Other intervertebral disc degeneration, lumbar region: Secondary | ICD-10-CM | POA: Diagnosis not present

## 2018-02-02 DIAGNOSIS — M9903 Segmental and somatic dysfunction of lumbar region: Secondary | ICD-10-CM | POA: Diagnosis not present

## 2018-02-02 DIAGNOSIS — H43812 Vitreous degeneration, left eye: Secondary | ICD-10-CM | POA: Diagnosis not present

## 2018-02-02 DIAGNOSIS — M9901 Segmental and somatic dysfunction of cervical region: Secondary | ICD-10-CM | POA: Diagnosis not present

## 2018-02-04 DIAGNOSIS — M9903 Segmental and somatic dysfunction of lumbar region: Secondary | ICD-10-CM | POA: Diagnosis not present

## 2018-02-04 DIAGNOSIS — M5136 Other intervertebral disc degeneration, lumbar region: Secondary | ICD-10-CM | POA: Diagnosis not present

## 2018-02-04 DIAGNOSIS — M9901 Segmental and somatic dysfunction of cervical region: Secondary | ICD-10-CM | POA: Diagnosis not present

## 2018-02-08 ENCOUNTER — Other Ambulatory Visit: Payer: Self-pay | Admitting: Physician Assistant

## 2018-02-09 DIAGNOSIS — M9901 Segmental and somatic dysfunction of cervical region: Secondary | ICD-10-CM | POA: Diagnosis not present

## 2018-02-09 DIAGNOSIS — M9903 Segmental and somatic dysfunction of lumbar region: Secondary | ICD-10-CM | POA: Diagnosis not present

## 2018-02-09 DIAGNOSIS — M5136 Other intervertebral disc degeneration, lumbar region: Secondary | ICD-10-CM | POA: Diagnosis not present

## 2018-02-11 ENCOUNTER — Ambulatory Visit (INDEPENDENT_AMBULATORY_CARE_PROVIDER_SITE_OTHER): Payer: 59 | Admitting: Adult Health

## 2018-02-11 ENCOUNTER — Encounter: Payer: Self-pay | Admitting: Adult Health

## 2018-02-11 VITALS — BP 120/78 | HR 84 | Temp 97.3°F | Ht 63.0 in | Wt 218.0 lb

## 2018-02-11 DIAGNOSIS — E559 Vitamin D deficiency, unspecified: Secondary | ICD-10-CM | POA: Diagnosis not present

## 2018-02-11 DIAGNOSIS — E782 Mixed hyperlipidemia: Secondary | ICD-10-CM

## 2018-02-11 DIAGNOSIS — N182 Chronic kidney disease, stage 2 (mild): Secondary | ICD-10-CM

## 2018-02-11 DIAGNOSIS — D649 Anemia, unspecified: Secondary | ICD-10-CM | POA: Diagnosis not present

## 2018-02-11 DIAGNOSIS — E1122 Type 2 diabetes mellitus with diabetic chronic kidney disease: Secondary | ICD-10-CM

## 2018-02-11 DIAGNOSIS — K2100 Gastro-esophageal reflux disease with esophagitis, without bleeding: Secondary | ICD-10-CM

## 2018-02-11 DIAGNOSIS — M5136 Other intervertebral disc degeneration, lumbar region: Secondary | ICD-10-CM | POA: Diagnosis not present

## 2018-02-11 DIAGNOSIS — I10 Essential (primary) hypertension: Secondary | ICD-10-CM

## 2018-02-11 DIAGNOSIS — M9901 Segmental and somatic dysfunction of cervical region: Secondary | ICD-10-CM | POA: Diagnosis not present

## 2018-02-11 DIAGNOSIS — M9903 Segmental and somatic dysfunction of lumbar region: Secondary | ICD-10-CM | POA: Diagnosis not present

## 2018-02-11 NOTE — Patient Instructions (Addendum)
Aim for 7+ servings of fruits and vegetables daily  80+ fluid ounces of water or unsweet tea for healthy kidneys  Limit animal fats in diet for cholesterol and heart health - choose grass fed whenever available  Aim for low stress - take time to unwind and care for your mental health  Aim for 150 min of moderate intensity exercise weekly for heart health, and weights twice weekly for bone health  Aim for 7-9 hours of sleep daily      When it comes to diets, agreement about the perfect plan isn't easy to find, even among the experts. Experts at the [employer] developed an idea known as the Healthy Eating Plate. Just imagine a plate divided into logical, healthy portions.  The emphasis is on diet quality:  Load up on vegetables and fruits - one-half of your plate: Aim for color and variety, and remember that potatoes don't count.  Go for whole grains - one-quarter of your plate: Whole wheat, barley, wheat berries, quinoa, oats, [place] rice, and foods made with them. If you want pasta, go with whole wheat pasta.  Protein power - one-quarter of your plate: Fish, chicken, beans, and nuts are all healthy, versatile protein sources. Limit red meat.  The diet, however, does go beyond the plate, offering a few other suggestions.  Use healthy plant oils, such as olive, canola, soy, corn, sunflower and peanut. Check the labels, and avoid partially hydrogenated oil, which have unhealthy trans fats.  If you're thirsty, drink water. Coffee and tea are good in moderation, but skip sugary drinks and limit milk and dairy products to one or two daily servings.  The type of carbohydrate in the diet is more important than the amount. Some sources of carbohydrates, such as vegetables, fruits, whole grains, and beans-are healthier than others.  Finally, stay active.      Are you an emotional eater? Do you eat more when you're feeling stressed? Do you eat when you're not  hungry or when you're full? Do you eat to feel better (to calm and soothe yourself when you're sad, mad, bored, anxious, etc.)? Do you reward yourself with food? Do you regularly eat until you've stuffed yourself? Does food make you feel safe? Do you feel like food is a friend? Do you feel powerless or out of control around food?  If you answered yes to some of these questions than it is likely that you are an emotional eater. This is normally a learned behavior and can take time to first recognize the signs and second BREAK THE HABIT. But here is more information and tips to help.   The difference between emotional hunger and physical hunger Emotional hunger can be powerful, so it's easy to mistake it for physical hunger. But there are clues you can look for to help you tell physical and emotional hunger apart.  Emotional hunger comes on suddenly. It hits you in an instant and feels overwhelming and urgent. Physical hunger, on the other hand, comes on more gradually. The urge to eat doesn't feel as dire or demand instant satisfaction (unless you haven't eaten for a very long time).  Emotional hunger craves specific comfort foods. When you're physically hungry, almost anything sounds good-including healthy stuff like vegetables. But emotional hunger craves junk food or sugary snacks that provide an instant rush. You feel like you need cheesecake or pizza, and nothing else will do.  Emotional hunger often leads to mindless eating. Before you know  it, you've eaten a whole bag of chips or an entire pint of ice cream without really paying attention or fully enjoying it. When you're eating in response to physical hunger, you're typically more aware of what you're doing.  Emotional hunger isn't satisfied once you're full. You keep wanting more and more, often eating until you're uncomfortably stuffed. Physical hunger, on the other hand, doesn't need to be stuffed. You feel satisfied when your stomach is  full.  Emotional hunger isn't located in the stomach. Rather than a growling belly or a pang in your stomach, you feel your hunger as a craving you can't get out of your head. You're focused on specific textures, tastes, and smells.  Emotional hunger often leads to regret, guilt, or shame. When you eat to satisfy physical hunger, you're unlikely to feel guilty or ashamed because you're simply giving your body what it needs. If you feel guilty after you eat, it's likely because you know deep down that you're not eating for nutritional reasons.  Identify your emotional eating triggers What situations, places, or feelings make you reach for the comfort of food? Most emotional eating is linked to unpleasant feelings, but it can also be triggered by positive emotions, such as rewarding yourself for achieving a goal or celebrating a holiday or happy event. Common causes of emotional eating include:  Stuffing emotions - Eating can be a way to temporarily silence or "stuff down" uncomfortable emotions, including anger, fear, sadness, anxiety, loneliness, resentment, and shame. While you're numbing yourself with food, you can avoid the difficult emotions you'd rather not feel.  Boredom or feelings of emptiness - Do you ever eat simply to give yourself something to do, to relieve boredom, or as a way to fill a void in your life? You feel unfulfilled and empty, and food is a way to occupy your mouth and your time. In the moment, it fills you up and distracts you from underlying feelings of purposelessness and dissatisfaction with your life.  Childhood habits - Think back to your childhood memories of food. Did your parents reward good behavior with ice cream, take you out for pizza when you got a good report card, or serve you sweets when you were feeling sad? These habits can often carry over into adulthood. Or your eating may be driven by nostalgia-for cherished memories of grilling burgers in the backyard with  your dad or baking and eating cookies with your mom.  Social influences - Getting together with other people for a meal is a great way to relieve stress, but it can also lead to overeating. It's easy to overindulge simply because the food is there or because everyone else is eating. You may also overeat in social situations out of nervousness. Or perhaps your family or circle of friends encourages you to overeat, and it's easier to go along with the group.  Stress - Ever notice how stress makes you hungry? It's not just in your mind. When stress is chronic, as it so often is in our chaotic, fast-paced world, your body produces high levels of the stress hormone, cortisol. Cortisol triggers cravings for salty, sweet, and fried foods-foods that give you a burst of energy and pleasure. The more uncontrolled stress in your life, the more likely you are to turn to food for emotional relief.  Find other ways to feed your feelings If you don't know how to manage your emotions in a way that doesn't involve food, you won't be able to control  your eating habits for very long. Diets so often fail because they offer logical nutritional advice which only works if you have conscious control over your eating habits. It doesn't work when emotions hijack the process, demanding an immediate payoff with food.  In order to stop emotional eating, you have to find other ways to fulfill yourself emotionally. It's not enough to understand the cycle of emotional eating or even to understand your triggers, although that's a huge first step. You need alternatives to food that you can turn to for emotional fulfillment.  Alternatives to emotional eating If you're depressed or lonely, call someone who always makes you feel better, play with your dog or cat, or look at a favorite photo or cherished memento.  If you're anxious, expend your nervous energy by dancing to your favorite song, squeezing a stress ball, or taking a brisk  walk.  If you're exhausted, treat yourself with a hot cup of tea, take a bath, light some scented candles, or wrap yourself in a warm blanket.  If you're bored, read a good book, watch a comedy show, explore the outdoors, or turn to an activity you enjoy (woodworking, playing the guitar, shooting hoops, scrapbooking, etc.).  What is mindful eating? Mindful eating is a practice that develops your awareness of eating habits and allows you to pause between your triggers and your actions. Most emotional eaters feel powerless over their food cravings. When the urge to eat hits, you feel an almost unbearable tension that demands to be fed, right now. Because you've tried to resist in the past and failed, you believe that your willpower just isn't up to snuff. But the truth is that you have more power over your cravings than you think.  Take 5 before you give in to a craving Emotional eating tends to be automatic and virtually mindless. Before you even realize what you're doing, you've reached for a tub of ice cream and polished off half of it. But if you can take a moment to pause and reflect when you're hit with a craving, you give yourself the opportunity to make a different decision.  Can you put off eating for five minutes? Or just start with one minute. Don't tell yourself you can't give in to the craving; remember, the forbidden is extremely tempting. Just tell yourself to wait.  While you're waiting, check in with yourself. How are you feeling? What's going on emotionally? Even if you end up eating, you'll have a better understanding of why you did it. This can help you set yourself up for a different response next time.  How to practice mindful eating Eating while you're also doing other things-such as watching TV, driving, or playing with your phone-can prevent you from fully enjoying your food. Since your mind is elsewhere, you may not feel satisfied or continue eating even though you're no  longer hungry. Eating more mindfully can help focus your mind on your food and the pleasure of a meal and curb overeating.   Eat your meals in a calm place with no distractions, aside from any dining companions.  Try eating with your non-dominant hand or using chopsticks instead of a knife and fork. Eating in such a non-familiar way can slow down how fast you eat and ensure your mind stays focused on your food.  Allow yourself enough time not to have to rush your meal. Set a timer for 20 minutes and pace yourself so you spend at least that much time eating.  Take small bites and chew them well, taking time to notice the different flavors and textures of each mouthful.  Put your utensils down between bites. Take time to consider how you feel-hungry, satiated-before picking up your utensils again.  Try to stop eating before you are full.It takes time for the signal to reach your brain that you've had enough. Don't feel obligated to always clean your plate.  When you've finished your food, take a few moments to assess if you're really still hungry before opting for an extra serving or dessert.  Learn to accept your feelings-even the bad ones  While it may seem that the core problem is that you're powerless over food, emotional eating actually stems from feeling powerless over your emotions. You don't feel capable of dealing with your feelings head on, so you avoid them with food.  Recommended reading  Mini Habits for weight loss  Healthy Eating: A guide to the new nutrition - [place] Report  10 Tips for Mindful Eating - How mindfulness can help you fully enjoy a meal and the experience of eating-with moderation and restraint. ([place])  Weight Loss: Gain Control of Emotional Eating - Tips to regain control of your eating habits. [facility])  Why Stress Causes People to Overeat -Tips on controlling stress eating. ([place])  Mindful Eating Meditations -Free online mindfulness meditations. ([facility])

## 2018-02-11 NOTE — Progress Notes (Signed)
FOLLOW UP  Assessment and Plan:   Hypertension Well controlled with current medications  Monitor blood pressure at home; patient to call if consistently greater than 130/80 Continue DASH diet.   Reminder to go to the ER if any CP, SOB, nausea, dizziness, severe HA, changes vision/speech, left arm numbness and tingling and jaw pain.  Cholesterol Currently above goal; switched to crestor at last visit; titrate to maximize as tolerated pending labs Continue low cholesterol diet and exercise.  Check lipid panel.   Diabetes with diabetic chronic kidney disease Continue medication: metformin 500 mg BID, discussed titration up pending lab results Continue diet and exercise.  Perform daily foot/skin check, notify office of any concerning changes.  Check A1C  Hypothyroidism continue medications the same pending lab results reminded to take on an empty stomach 30-40mins before food.  check TSH level  Obesity with co morbidities Long discussion about weight loss, diet, and exercise Recommended diet heavy in fruits and veggies and low in animal meats, cheeses, and dairy products, appropriate calorie intake Discussed ideal weight for height and initial weight goal (212 lb) Patient will work on cutting down on sweets Will follow up in 3 months  Vitamin D Def Below goal at last visit; continue supplementation to maintain goal of 70-100; currently taking very irregularly  Check Vit D level  Continue diet and meds as discussed. Further disposition pending results of labs. Discussed med's effects and SE's.   Over 30 minutes of exam, counseling, chart review, and critical decision making was performed.   Future Appointments  Date Time Provider [place]  04/29/2018  4:30 PM Vicie Mutters, PA-C [facility] None  10/22/2018  3:00 PM Liane Comber, NP [facility] None     ----------------------------------------------------------------------------------------------------------------------  HPI 62 y.o. female  presents for 3 month follow up on hypertension, cholesterol, diabetes, weight and vitamin D deficiency. She is recovering from neck pain after recent vasovagal syncope after straining over BM; had CT head/neck that was unremarkable. Taking tylenol during the day, 1/4 tab norco PRN.   she has a diagnosis of GERD which is currently managed by protonix 40 mg in AM, ranitidine as needed for breakthrough. She did not do well trying to taper off of protonix.  she reports symptoms is currently well controlled, and denies breakthrough reflux, burning in chest, hoarseness or cough.    BMI is Body mass index is 38.62 kg/m., she has not been working on diet, but continues to walk 2-3 miles daily. Wt Readings from Last 3 Encounters:  02/11/18 218 lb (98.9 kg)  01/26/18 200 lb (90.7 kg)  10/22/17 213 lb (96.6 kg)   Her blood pressure has been controlled at home, today their BP is BP: 120/78  She does workout. She denies chest pain, shortness of breath, dizziness.   She is on cholesterol medication (rosuvastatin 10 mg three times weekly, new medication) and denies myalgias. Her cholesterol is not at goal. The cholesterol last visit was:   Lab Results  Component Value Date   CHOL 200 (H) 10/22/2017   HDL 54 10/22/2017   LDLCALC 115 (H) 10/22/2017   TRIG 195 (H) 10/22/2017   CHOLHDL 3.7 10/22/2017    She has been working on diet and exercise for T2 diabetes on metformin, and denies foot ulcerations, increased appetite, nausea, paresthesia of the feet, polydipsia, polyuria, visual disturbances, vomiting and weight loss. Last A1C in the office was:  Lab Results  Component Value Date   HGBA1C 6.4 (H) 10/22/2017   She is on  thyroid medication. Her medication was not changed last visit.   Lab Results  Component Value Date   TSH 2.53 10/22/2017   Patient is  on Vitamin D supplement, taking 10000 IU 1-2 times weekly    Lab Results  Component Value Date   VD25OH 48 (L) 10/22/2017        Current Medications:  Current Outpatient Medications on File Prior to Visit  Medication Sig  . acetaminophen (TYLENOL) 500 MG tablet Take 1,000 mg by mouth every 6 (six) hours as needed for mild pain, moderate pain or headache.  . albuterol (PROAIR HFA) 108 (90 Base) MCG/ACT inhaler Inhale 2 puffs into the lungs every 6 (six) hours as needed for wheezing.  Marland Kitchen amitriptyline (ELAVIL) 10 MG tablet TAKE 1 TO 2 TABLETS BY MOUTH DAILY AT BEDTIME (Patient taking differently: TAKE 10mg  BY MOUTH DAILY AT BEDTIME as needed for sleep)  . Cholecalciferol (VITAMIN D3) 10000 units capsule Take 10,000 Units by mouth daily.  . furosemide (LASIX) 40 MG tablet TAKE 1 TABLET EVERY MORNING FOR BLOOD PRESSURE AND FLUID (Patient taking differently: Take 40 mg by mouth daily as needed for edema. Takes every three days)  . HYDROcodone-acetaminophen (NORCO) 5-325 MG tablet Take 1 tablet by mouth every 4 (four) hours as needed for moderate pain.  Marland Kitchen losartan (COZAAR) 100 MG tablet TAKE 1 TABLET BY MOUTH EVERY DAY  . montelukast (SINGULAIR) 10 MG tablet Take 1 tablet (10 mg total) by mouth daily.  . pantoprazole (PROTONIX) 40 MG tablet Take 1 tablet (40 mg total) by mouth daily.  . ranitidine (ZANTAC) 300 MG tablet TAKE TWICE A DAY WHILE TRYING TO GET OFF PPI (Patient taking differently: Take 300 mg by mouth at bedtime. Takes Pantoprazole in the morning)  . rosuvastatin (CRESTOR) 10 MG tablet Take 1 tablet 2-3 days a week, stop the lipitor (Patient taking differently: Take 10 mg by mouth every Monday, Wednesday, and Friday. )  . sertraline (ZOLOFT) 100 MG tablet TAKE 1 OR 2 TABLETS BY MOUTH DAILY FOR ANXIETY  . SYNTHROID 75 MCG tablet TAKE 1 TABLET DAILY  . metFORMIN (GLUCOPHAGE-XR) 500 MG 24 hr tablet Take 1 to 2 tablets 2 x / day with food for Diabetes (Patient taking differently: Take 500 mg  by mouth 2 (two) times daily. )   No current facility-administered medications on file prior to visit.      Allergies:  Allergies  Allergen Reactions  . Levaquin [Levofloxacin In D5w] Other (See Comments)    Ankles hurt really bad ? ACHILLES TENDON ?  Marland Kitchen Doxycycline Other (See Comments)    UNSPECIFIED REACTION      Medical History:  Past Medical History:  Diagnosis Date  . Anemia    hx  . Anxiety   . Arthritis    back  . Asthma   . Depression   . Deviated septum   . GERD (gastroesophageal reflux disease)   . Headache   . History of hiatal hernia   . Hyperlipemia   . Hypertension   . Hypothyroidism   . OSA (obstructive sleep apnea)    does not use -last 6 months  . Pre-diabetes    per pt   Family history- Reviewed and unchanged Social history- Reviewed and unchanged   Review of Systems:  Review of Systems  Constitutional: Negative for malaise/fatigue and weight loss.  HENT: Negative for hearing loss and tinnitus.   Eyes: Negative for blurred vision and double vision.  Respiratory: Negative for cough, shortness of breath  and wheezing.   Cardiovascular: Negative for chest pain, palpitations, orthopnea, claudication and leg swelling.  Gastrointestinal: Negative for abdominal pain, blood in stool, constipation, diarrhea, heartburn, melena, nausea and vomiting.  Genitourinary: Negative.   Musculoskeletal: Negative for joint pain and myalgias.  Skin: Negative for rash.  Neurological: Negative for dizziness, tingling, sensory change, weakness and headaches.  Endo/Heme/Allergies: Negative for polydipsia.  Psychiatric/Behavioral: Negative for depression and substance abuse. The patient is not nervous/anxious and does not have insomnia.   All other systems reviewed and are negative.     Physical Exam: BP 120/78   Pulse 84   Temp (!) 97.3 F (36.3 C)   Ht 5\' 3"  (1.6 m)   Wt 218 lb (98.9 kg)   SpO2 97%   BMI 38.62 kg/m  Wt Readings from Last 3 Encounters:   02/11/18 218 lb (98.9 kg)  01/26/18 200 lb (90.7 kg)  10/22/17 213 lb (96.6 kg)   General Appearance: Well nourished, in no apparent distress. Eyes: PERRLA, EOMs, conjunctiva no swelling or erythema Sinuses: No Frontal/maxillary tenderness ENT/Mouth: Ext aud canals clear, TMs without erythema, bulging. No erythema, swelling, or exudate on post pharynx.  Tonsils not swollen or erythematous. Hearing normal.  Neck: Supple, thyroid normal.  Respiratory: Respiratory effort normal, BS equal bilaterally without rales, rhonchi, wheezing or stridor.  Cardio: RRR with no MRGs. Brisk peripheral pulses without edema.  Abdomen: Soft, + BS.  Non tender, no guarding, rebound, hernias, masses. Lymphatics: Non tender without lymphadenopathy.  Musculoskeletal: Full ROM, 5/5 strength, Normal gait Skin: Warm, dry without rashes, lesions, ecchymosis.  Neuro: Cranial nerves intact. No cerebellar symptoms.  Psych: Awake and oriented X 3, normal affect, Insight and Judgment appropriate.    Izora Ribas, NP 4:38 PM [facility]

## 2018-02-12 LAB — LIPID PANEL
Cholesterol: 121 mg/dL (ref <200)
HDL: 53 mg/dL (ref >50)
LDL Cholesterol (Calc): 48 mg/dL (calc)
Non-HDL Cholesterol (Calc): 68 mg/dL (calc) (ref <130)
Total CHOL/HDL Ratio: 2.3 (calc) (ref <5.0)
Triglycerides: 118 mg/dL (ref <150)

## 2018-02-12 LAB — COMPLETE METABOLIC PANEL WITH GFR
AG Ratio: 1.4 (calc) (ref 1.0–2.5)
ALT: 30 U/L — ABNORMAL HIGH (ref 6–29)
AST: 24 U/L (ref 10–35)
Albumin: 4.1 g/dL (ref 3.6–5.1)
Alkaline phosphatase (APISO): 72 U/L (ref 33–130)
BUN: 17 mg/dL (ref 7–25)
CO2: 31 mmol/L (ref 20–32)
Calcium: 9.3 mg/dL (ref 8.6–10.4)
Chloride: 103 mmol/L (ref 98–110)
Creat: 0.97 mg/dL (ref 0.50–0.99)
GFR, Est African American: 74 mL/min/{1.73_m2} (ref >=60)
GFR, Est Non African American: 63 mL/min/{1.73_m2} (ref >=60)
Globulin: 3 g/dL (calc) (ref 1.9–3.7)
Glucose, Bld: 113 mg/dL — ABNORMAL HIGH (ref 65–99)
Potassium: 4 mmol/L (ref 3.5–5.3)
Sodium: 142 mmol/L (ref 135–146)
Total Bilirubin: 0.3 mg/dL (ref 0.2–1.2)
Total Protein: 7.1 g/dL (ref 6.1–8.1)

## 2018-02-12 LAB — HEMOGLOBIN A1C
Hgb A1c MFr Bld: 6.2 % of total Hgb — ABNORMAL HIGH (ref <5.7)
Mean Plasma Glucose: 131 (calc)
eAG (mmol/L): 7.3 (calc)

## 2018-02-12 LAB — CBC WITH DIFFERENTIAL/PLATELET
Basophils Absolute: 47 cells/uL (ref 0–200)
Basophils Relative: 0.5 %
Eosinophils Absolute: 479 cells/uL (ref 15–500)
Eosinophils Relative: 5.1 %
HCT: 36.1 % (ref 35.0–45.0)
Hemoglobin: 11.7 g/dL (ref 11.7–15.5)
Lymphs Abs: 3102 cells/uL (ref 850–3900)
MCH: 25.2 pg — ABNORMAL LOW (ref 27.0–33.0)
MCHC: 32.4 g/dL (ref 32.0–36.0)
MCV: 77.6 fL — ABNORMAL LOW (ref 80.0–100.0)
MPV: 9.9 fL (ref 7.5–12.5)
Monocytes Relative: 7.7 %
Neutro Abs: 5048 cells/uL (ref 1500–7800)
Neutrophils Relative %: 53.7 %
Platelets: 309 10*3/uL (ref 140–400)
RBC: 4.65 10*6/uL (ref 3.80–5.10)
RDW: 15.4 % — ABNORMAL HIGH (ref 11.0–15.0)
Total Lymphocyte: 33 %
WBC mixed population: 724 cells/uL (ref 200–950)
WBC: 9.4 10*3/uL (ref 3.8–10.8)

## 2018-02-12 LAB — MAGNESIUM: Magnesium: 1.9 mg/dL (ref 1.5–2.5)

## 2018-02-12 LAB — VITAMIN D 25 HYDROXY (VIT D DEFICIENCY, FRACTURES): Vit D, 25-Hydroxy: 31 ng/mL (ref 30–100)

## 2018-02-12 LAB — TSH: TSH: 4.54 mIU/L — ABNORMAL HIGH (ref 0.40–4.50)

## 2018-02-25 DIAGNOSIS — M19042 Primary osteoarthritis, left hand: Secondary | ICD-10-CM | POA: Diagnosis not present

## 2018-02-25 DIAGNOSIS — M65341 Trigger finger, right ring finger: Secondary | ICD-10-CM | POA: Diagnosis not present

## 2018-02-25 DIAGNOSIS — M19041 Primary osteoarthritis, right hand: Secondary | ICD-10-CM | POA: Diagnosis not present

## 2018-03-02 ENCOUNTER — Other Ambulatory Visit: Payer: Self-pay | Admitting: Physician Assistant

## 2018-03-05 DIAGNOSIS — M9903 Segmental and somatic dysfunction of lumbar region: Secondary | ICD-10-CM | POA: Diagnosis not present

## 2018-03-05 DIAGNOSIS — M9901 Segmental and somatic dysfunction of cervical region: Secondary | ICD-10-CM | POA: Diagnosis not present

## 2018-03-05 DIAGNOSIS — M5136 Other intervertebral disc degeneration, lumbar region: Secondary | ICD-10-CM | POA: Diagnosis not present

## 2018-03-16 DIAGNOSIS — M9903 Segmental and somatic dysfunction of lumbar region: Secondary | ICD-10-CM | POA: Diagnosis not present

## 2018-03-16 DIAGNOSIS — M9901 Segmental and somatic dysfunction of cervical region: Secondary | ICD-10-CM | POA: Diagnosis not present

## 2018-03-16 DIAGNOSIS — M5136 Other intervertebral disc degeneration, lumbar region: Secondary | ICD-10-CM | POA: Diagnosis not present

## 2018-03-18 DIAGNOSIS — M5136 Other intervertebral disc degeneration, lumbar region: Secondary | ICD-10-CM | POA: Diagnosis not present

## 2018-03-18 DIAGNOSIS — M9903 Segmental and somatic dysfunction of lumbar region: Secondary | ICD-10-CM | POA: Diagnosis not present

## 2018-03-18 DIAGNOSIS — M9901 Segmental and somatic dysfunction of cervical region: Secondary | ICD-10-CM | POA: Diagnosis not present

## 2018-03-27 ENCOUNTER — Other Ambulatory Visit: Payer: Self-pay | Admitting: Physician Assistant

## 2018-04-23 NOTE — Progress Notes (Signed)
FOLLOW UP  Assessment and Plan:   Hypertension Well controlled with current medications  Monitor blood pressure at home; patient to call if consistently greater than 130/80 Continue DASH diet.   Reminder to go to the ER if any CP, SOB, nausea, dizziness, severe HA, changes vision/speech, left arm numbness and tingling and jaw pain.  Cholesterol At goal Continue low cholesterol diet and exercise.  Check lipid panel.   Diabetes with diabetic chronic kidney disease Continue medication Continue diet and exercise.  Perform daily foot/skin check, notify office of any concerning changes.  Check A1C  Hypothyroidism continue medications the same pending lab results- has had constipation and fatigue reminded to take on an empty stomach 30-21mins before food.  check TSH level  Obesity with co morbidities Long discussion about weight loss, diet, and exercise Recommended diet heavy in fruits and veggies and low in animal meats, cheeses, and dairy products, appropriate calorie intake Patient will work on cutting down on sweets Will follow up in 3 months  RUQ pain ? musculoskeletal versus from gallbladder surgery Will try tylenol, declines [place] at this time   Continue diet and meds as discussed. Further disposition pending results of labs. Discussed med's effects and SE's.   Over 30 minutes of exam, counseling, chart review, and critical decision making was performed.   Future Appointments  Date Time Provider [place]  05/06/2018  4:00 PM Meredith Pel, MD [facility] None  10/22/2018  3:00 PM Liane Comber, NP [facility] None    ----------------------------------------------------------------------------------------------------------------------  HPI 62 y.o. female  presents for 3 month follow up on hypertension, cholesterol, diabetes, weight and vitamin D deficiency.  She has constant dull ache RUQ, with occ sharp pain. No accompaniments.   BMI is Body mass index is  37.77 kg/m., she has not been working on diet, but continues to walk 2-3 miles daily. Wt Readings from Last 3 Encounters:  04/29/18 213 lb 3.2 oz (96.7 kg)  02/11/18 218 lb (98.9 kg)  01/26/18 200 lb (90.7 kg)   Her blood pressure has been controlled at home, today their BP is BP: 122/76  She does workout. She denies chest pain, shortness of breath, dizziness.   She is on cholesterol medication (rosuvastatin 10 mg three times weekly, new medication) and denies myalgias. Her cholesterol is not at goal. The cholesterol last visit was:   Lab Results  Component Value Date   CHOL 121 02/11/2018   HDL 53 02/11/2018   LDLCALC 48 02/11/2018   TRIG 118 02/11/2018   CHOLHDL 2.3 02/11/2018    She has been working on diet and exercise for T2 diabetes on metformin, and denies foot ulcerations, increased appetite, nausea, paresthesia of the feet, polydipsia, polyuria, visual disturbances, vomiting and weight loss. Last A1C in the office was:  Lab Results  Component Value Date   HGBA1C 6.2 (H) 02/11/2018   She is on thyroid medication, she states she is very tired and constipated. Her medication was not changed last visit.   Lab Results  Component Value Date   TSH 4.54 (H) 02/11/2018   Patient is on Vitamin D supplement, taking 10000 IU 1-2 times weekly    Lab Results  Component Value Date   VD25OH 31 02/11/2018       Current Medications:  Current Outpatient Medications on File Prior to Visit  Medication Sig  . acetaminophen (TYLENOL) 500 MG tablet Take 1,000 mg by mouth every 6 (six) hours as needed for mild pain, moderate pain or headache.  Marland Kitchen  albuterol (PROAIR HFA) 108 (90 Base) MCG/ACT inhaler Inhale 2 puffs into the lungs every 6 (six) hours as needed for wheezing.  Marland Kitchen amitriptyline (ELAVIL) 10 MG tablet TAKE 1 TO 2 TABLETS BY MOUTH DAILY AT BEDTIME (Patient taking differently: TAKE 10mg  BY MOUTH DAILY AT BEDTIME as needed for sleep)  . Cholecalciferol (VITAMIN D3) 10000 units capsule  Take 10,000 Units by mouth daily.  . furosemide (LASIX) 40 MG tablet TAKE 1 TABLET EVERY MORNING FOR BLOOD PRESSURE AND FLUID (Patient taking differently: Take 40 mg by mouth daily as needed for edema. Takes every three days)  . HYDROcodone-acetaminophen (NORCO) 5-325 MG tablet Take 1 tablet by mouth every 4 (four) hours as needed for moderate pain.  Marland Kitchen losartan (COZAAR) 100 MG tablet TAKE 1 TABLET BY MOUTH EVERY DAY  . montelukast (SINGULAIR) 10 MG tablet Take 1 tablet (10 mg total) by mouth daily.  . pantoprazole (PROTONIX) 40 MG tablet TAKE 1 TABLET BY MOUTH EVERY DAY  . ranitidine (ZANTAC) 300 MG tablet TAKE TWICE A DAY WHILE TRYING TO GET OFF PPI  . rosuvastatin (CRESTOR) 10 MG tablet Take 1 tablet (10 mg total) by mouth every Monday, Wednesday, and Friday.  . sertraline (ZOLOFT) 100 MG tablet TAKE 1 OR 2 TABLETS BY MOUTH DAILY FOR ANXIETY  . SYNTHROID 75 MCG tablet TAKE 1 TABLET DAILY  . metFORMIN (GLUCOPHAGE-XR) 500 MG 24 hr tablet Take 1 to 2 tablets 2 x / day with food for Diabetes (Patient taking differently: Take 500 mg by mouth 2 (two) times daily. )   No current facility-administered medications on file prior to visit.      Allergies:  Allergies  Allergen Reactions  . Levaquin [Levofloxacin In D5w] Other (See Comments)    Ankles hurt really bad ? ACHILLES TENDON ?  Marland Kitchen Doxycycline Other (See Comments)    UNSPECIFIED REACTION      Medical History:  Past Medical History:  Diagnosis Date  . Anemia    hx  . Anxiety   . Arthritis    back  . Asthma   . Depression   . Deviated septum   . GERD (gastroesophageal reflux disease)   . Headache   . History of hiatal hernia   . Hyperlipemia   . Hypertension   . Hypothyroidism   . OSA (obstructive sleep apnea)    does not use -last 6 months  . Pre-diabetes    per pt   Family history- Reviewed and unchanged Social history- Reviewed and unchanged   Review of Systems:  Review of Systems  Constitutional: Negative for  malaise/fatigue and weight loss.  HENT: Negative for hearing loss and tinnitus.   Eyes: Negative for blurred vision and double vision.  Respiratory: Negative for cough, shortness of breath and wheezing.   Cardiovascular: Negative for chest pain, palpitations, orthopnea, claudication and leg swelling.  Gastrointestinal: Negative for abdominal pain, blood in stool, constipation, diarrhea, heartburn, melena, nausea and vomiting.  Genitourinary: Negative.   Musculoskeletal: Negative for joint pain and myalgias.  Skin: Negative for rash.  Neurological: Negative for dizziness, tingling, sensory change, weakness and headaches.  Endo/Heme/Allergies: Negative for polydipsia.  Psychiatric/Behavioral: Negative for depression and substance abuse. The patient is not nervous/anxious and does not have insomnia.   All other systems reviewed and are negative.     Physical Exam: BP 122/76   Pulse 86   Temp 97.7 F (36.5 C)   Resp 14   Ht 5\' 3"  (1.6 m)   Wt 213 lb 3.2  oz (96.7 kg)   SpO2 98%   BMI 37.77 kg/m  Wt Readings from Last 3 Encounters:  04/29/18 213 lb 3.2 oz (96.7 kg)  02/11/18 218 lb (98.9 kg)  01/26/18 200 lb (90.7 kg)   General Appearance: Well nourished, in no apparent distress. Eyes: PERRLA, EOMs, conjunctiva no swelling or erythema Sinuses: No Frontal/maxillary tenderness ENT/Mouth: Ext aud canals clear, TMs without erythema, bulging. No erythema, swelling, or exudate on post pharynx.  Tonsils not swollen or erythematous. Hearing normal.  Neck: Supple, thyroid normal.  Respiratory: Respiratory effort normal, BS equal bilaterally without rales, rhonchi, wheezing or stridor.  Cardio: RRR with no MRGs. Brisk peripheral pulses without edema.  Abdomen: Soft, + BS,  mild epigastric tenderness, no guarding, rebound, hernias, masses. Lymphatics: Non tender without lymphadenopathy.  Musculoskeletal: Full ROM, 5/5 strength, Normal gait,  Skin: Port wine stain right face. Warm, dry  without rashes, lesions, ecchymosis.  Neuro: Cranial nerves intact. Normal muscle tone, no cerebellar symptoms. Psych: Awake and oriented X 3, normal affect, Insight and Judgment appropriate   Vicie Mutters, PA-C 4:00 PM [facility]

## 2018-04-24 ENCOUNTER — Other Ambulatory Visit: Payer: Self-pay

## 2018-04-24 MED ORDER — RANITIDINE HCL 300 MG PO TABS: ORAL_TABLET | ORAL | 3 refills | Status: DC

## 2018-04-29 ENCOUNTER — Encounter: Payer: Self-pay | Admitting: Physician Assistant

## 2018-04-29 ENCOUNTER — Ambulatory Visit (INDEPENDENT_AMBULATORY_CARE_PROVIDER_SITE_OTHER): Payer: 59 | Admitting: Physician Assistant

## 2018-04-29 ENCOUNTER — Ambulatory Visit: Payer: Self-pay | Admitting: Physician Assistant

## 2018-04-29 VITALS — BP 122/76 | HR 86 | Temp 97.7°F | Resp 14 | Ht 63.0 in | Wt 213.2 lb

## 2018-04-29 DIAGNOSIS — Q8589 Other phakomatoses, not elsewhere classified: Secondary | ICD-10-CM

## 2018-04-29 DIAGNOSIS — E1122 Type 2 diabetes mellitus with diabetic chronic kidney disease: Secondary | ICD-10-CM | POA: Diagnosis not present

## 2018-04-29 DIAGNOSIS — I10 Essential (primary) hypertension: Secondary | ICD-10-CM

## 2018-04-29 DIAGNOSIS — N182 Chronic kidney disease, stage 2 (mild): Secondary | ICD-10-CM | POA: Diagnosis not present

## 2018-04-29 DIAGNOSIS — Q858 Other phakomatoses, not elsewhere classified: Secondary | ICD-10-CM

## 2018-04-29 NOTE — Patient Instructions (Signed)
You can take tylenol (500mg ) or tylenol arthritis (650mg ) with the meloxicam/antiinflammatories. The max you can take of tylenol a day is 3000mg  daily, this is a max of 6 pills a day of the regular tyelnol (500mg ) or a max of 4 a day of the tylenol arthritis (650mg ) as long as no other medications you are taking contain tylenol.   See if this helps the pain, take 2 tylenol before bed If this helps may be from your back, we can send you to physical therapy or get back xray  If this does not help we can get AB ultrasound  Please go to the ER if you have any severe AB pain, unable to hold down food/water, blood in stool or vomit, chest pain, shortness of breath, or any worsening symptoms.    Abdominal Pain, Adult Abdominal pain can be caused by many things. Often, abdominal pain is not serious and it gets better with no treatment or by being treated at home. However, sometimes abdominal pain is serious. Your health care provider will do a medical history and a physical exam to try to determine the cause of your abdominal pain. Follow these instructions at home:  Take over-the-counter and prescription medicines only as told by your health care provider. Do not take a laxative unless told by your health care provider.  Drink enough fluid to keep your urine clear or pale yellow.  Watch your condition for any changes.  Keep all follow-up visits as told by your health care provider. This is important. Contact a health care provider if:  Your abdominal pain changes or gets worse.  You are not hungry or you lose weight without trying.  You are constipated or have diarrhea for more than 2-3 days.  You have pain when you urinate or have a bowel movement.  Your abdominal pain wakes you up at night.  Your pain gets worse with meals, after eating, or with certain foods.  You are throwing up and cannot keep anything down.  You have a fever. Get help right away if:  Your pain does not go away  as soon as your health care provider told you to expect.  You cannot stop throwing up.  Your pain is only in areas of the abdomen, such as the right side or the left lower portion of the abdomen.  You have bloody or black stools, or stools that look like tar.  You have severe pain, cramping, or bloating in your abdomen.  You have signs of dehydration, such as: ? Dark urine, very little urine, or no urine. ? Cracked lips. ? Dry mouth. ? Sunken eyes. ? Sleepiness. ? Weakness. This information is not intended to replace advice given to you by your health care provider. Make sure you discuss any questions you have with your health care provider. Document Released: 05/22/2005 Document Revised: 03/01/2016 Document Reviewed: 01/24/2016 Elsevier Interactive Patient Education  [employer].

## 2018-04-30 LAB — LIPID PANEL
Cholesterol: 159 mg/dL (ref <200)
HDL: 57 mg/dL (ref >50)
LDL Cholesterol (Calc): 76 mg/dL (calc)
Non-HDL Cholesterol (Calc): 102 mg/dL (calc) (ref <130)
Total CHOL/HDL Ratio: 2.8 (calc) (ref <5.0)
Triglycerides: 164 mg/dL — ABNORMAL HIGH (ref <150)

## 2018-04-30 LAB — COMPLETE METABOLIC PANEL WITH GFR
AG Ratio: 1.6 (calc) (ref 1.0–2.5)
ALT: 25 U/L (ref 6–29)
AST: 24 U/L (ref 10–35)
Albumin: 4.4 g/dL (ref 3.6–5.1)
Alkaline phosphatase (APISO): 79 U/L (ref 33–130)
BUN: 23 mg/dL (ref 7–25)
CO2: 29 mmol/L (ref 20–32)
Calcium: 9.7 mg/dL (ref 8.6–10.4)
Chloride: 99 mmol/L (ref 98–110)
Creat: 0.96 mg/dL (ref 0.50–0.99)
GFR, Est African American: 75 mL/min/{1.73_m2} (ref >=60)
GFR, Est Non African American: 64 mL/min/{1.73_m2} (ref >=60)
Globulin: 2.8 g/dL (calc) (ref 1.9–3.7)
Glucose, Bld: 95 mg/dL (ref 65–99)
Potassium: 4.3 mmol/L (ref 3.5–5.3)
Sodium: 138 mmol/L (ref 135–146)
Total Bilirubin: 0.4 mg/dL (ref 0.2–1.2)
Total Protein: 7.2 g/dL (ref 6.1–8.1)

## 2018-04-30 LAB — TSH: TSH: 2.35 mIU/L (ref 0.40–4.50)

## 2018-04-30 LAB — CBC WITH DIFFERENTIAL/PLATELET
Basophils Absolute: 53 cells/uL (ref 0–200)
Basophils Relative: 0.5 %
Eosinophils Absolute: 435 cells/uL (ref 15–500)
Eosinophils Relative: 4.1 %
HCT: 37.7 % (ref 35.0–45.0)
Hemoglobin: 12.1 g/dL (ref 11.7–15.5)
Lymphs Abs: 3477 cells/uL (ref 850–3900)
MCH: 24.9 pg — ABNORMAL LOW (ref 27.0–33.0)
MCHC: 32.1 g/dL (ref 32.0–36.0)
MCV: 77.6 fL — ABNORMAL LOW (ref 80.0–100.0)
MPV: 10.2 fL (ref 7.5–12.5)
Monocytes Relative: 6.3 %
Neutro Abs: 5968 cells/uL (ref 1500–7800)
Neutrophils Relative %: 56.3 %
Platelets: 301 10*3/uL (ref 140–400)
RBC: 4.86 10*6/uL (ref 3.80–5.10)
RDW: 15.7 % — ABNORMAL HIGH (ref 11.0–15.0)
Total Lymphocyte: 32.8 %
WBC mixed population: 668 cells/uL (ref 200–950)
WBC: 10.6 10*3/uL (ref 3.8–10.8)

## 2018-04-30 LAB — HEMOGLOBIN A1C
Hgb A1c MFr Bld: 6.3 % of total Hgb — ABNORMAL HIGH (ref <5.7)
Mean Plasma Glucose: 134 (calc)
eAG (mmol/L): 7.4 (calc)

## 2018-05-06 ENCOUNTER — Encounter (INDEPENDENT_AMBULATORY_CARE_PROVIDER_SITE_OTHER): Payer: Self-pay | Admitting: Orthopedic Surgery

## 2018-05-06 ENCOUNTER — Ambulatory Visit (INDEPENDENT_AMBULATORY_CARE_PROVIDER_SITE_OTHER): Payer: 59 | Admitting: Orthopedic Surgery

## 2018-05-06 ENCOUNTER — Ambulatory Visit (INDEPENDENT_AMBULATORY_CARE_PROVIDER_SITE_OTHER): Payer: Self-pay

## 2018-05-06 ENCOUNTER — Other Ambulatory Visit: Payer: Self-pay | Admitting: *Deleted

## 2018-05-06 DIAGNOSIS — M542 Cervicalgia: Secondary | ICD-10-CM | POA: Diagnosis not present

## 2018-05-06 DIAGNOSIS — M79601 Pain in right arm: Secondary | ICD-10-CM

## 2018-05-06 DIAGNOSIS — M25511 Pain in right shoulder: Secondary | ICD-10-CM

## 2018-05-06 DIAGNOSIS — G8929 Other chronic pain: Secondary | ICD-10-CM

## 2018-05-06 MED ORDER — RANITIDINE HCL 300 MG PO TABS: ORAL_TABLET | ORAL | 3 refills | Status: DC

## 2018-05-06 NOTE — Progress Notes (Signed)
Office Visit Note   Patient: Kristina Webb           Date of Birth: [birth year]           MRN: [id number] Visit Date: 05/06/2018 Requested by: Unk Pinto, [address] [place], [place] [address] PCP: Unk Pinto, MD  Subjective: Chief Complaint  Patient presents with  . Right Shoulder - Pain  . Neck - Pain    HPI: Patient presents with right shoulder and arm pain.  She reports anterior pain in the shoulder for a year but is been particularly bad for the past 4 months.  She actually fell on her head about that time and then her shoulder started hurting several weeks afterwards.  She states that she can type okay but it hurts for her to lay on that right arm.  She has a history of a bursitis diagnosis years ago.  She reports some possible weakness when using the arm particularly when going from overhead to below shoulder level.  She does describe some radiation of the pain down the back of the arm into the forearm but not to the hands.  It is hard for her to bring her arm down from a raise position.  She takes Tylenol and occasional over-the-counter medication.  She reports some shoulder blade symptoms as well.              ROS: All systems reviewed are negative as they relate to the chief complaint within the history of present illness.  Patient denies  fevers or chills.   Assessment & Plan: Visit Diagnoses:  1. Right arm pain   2. Cervicalgia   3. Chronic right shoulder pain     Plan: Impression is right arm and shoulder pain with history of neck injury and fairly significant arthritis within the cervical spine.  Radiographs of the shoulder within normal limits.  She does have witnessed weakness when going from forward flexion above 90 degrees holding something and then bring it back down.  S more consistent with rotator cuff pathology.  I think either are possible but her symptoms are worsening.  She is failed a long course of conservative nonoperative  treatment.  I think she needs both MRI arthrogram of the right shoulder to evaluate for small rotator cuff tear as well as MRI cervical spine to evaluate for right-sided cervical radiculopathy particularly in the face of extension trauma to the neck and worsening symptoms.  Follow-Up Instructions: Return for after MRI.   Orders:  Orders Placed This Encounter  Procedures  . XR Shoulder Right  . XR Cervical Spine 2 or 3 views  . MR Cervical Spine w/o contrast  . MR SHOULDER RIGHT W CONTRAST  . Arthrogram   No orders of the defined types were placed in this encounter.     Procedures: No procedures performed   Clinical Data: No additional findings.  Objective: Vital Signs: There were no vitals taken for this visit.  Physical Exam:   Constitutional: Patient appears well-developed HEENT:  Head: Normocephalic Eyes:EOM are normal Neck: Normal range of motion Cardiovascular: Normal rate Pulmonary/chest: Effort normal Neurologic: Patient is alert Skin: Skin is warm Psychiatric: Patient has normal mood and affect    Ortho Exam: Ortho exam demonstrates good cervical spine range of motion but with some limitation of about 10 to 20 degrees of full flexion and extension as well as rotation.  Patient has 5 out of 5 grip EPL FPL interosseous wrist flexion extension  bicep triceps and deltoid strength.  No definite paresthesias C5-T1.  No muscle atrophy in the shoulder girdle or arm region bilaterally.  Patient does have some anterior shoulder tenderness right in the bicipital groove region but she does not have any restriction of external rotation of 15 degrees of abduction.  Rotator cuff strength is pretty good to infraspinatus and subscap testing but slightly weaker to supraspinatus testing on the right compared to the left.  No other masses lymph adenopathy or skin changes noted in that shoulder girdle region  Specialty Comments:  No specialty comments available.  Imaging: Xr  Cervical Spine 2 Or 3 Views  Result Date: 05/06/2018 AP lateral cervical spine reviewed.  Patient has C4-5 C5-6 and C6-7 degenerative disc disease with milder corresponding facet arthritis.  No spondylolisthesis or cervical spine subluxation.  Bone density appears slightly osteopenic.    PMFS History: Patient Active Problem List   Diagnosis Date Noted  . Lower back pain 07/17/2015  . 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 Obesity, morbid ([place]) 03/29/2015  . CKD stage 2 due to type 2 diabetes mellitus ([place]) 04/15/2014  . Hyperlipemia   . Type 2 diabetes mellitus ([place])   . Anemia   . Asthma   . Depression   . OSA (obstructive sleep apnea)   . Hypertension   . GERD (gastroesophageal reflux disease)   . Cough 12/28/2012   Past Medical History:  Diagnosis Date  . Anemia    hx  . Anxiety   . Arthritis    back  . Asthma   . Depression   . Deviated septum   . GERD (gastroesophageal reflux disease)   . Headache   . History of hiatal hernia   . Hyperlipemia   . Hypertension   . Hypothyroidism   . OSA (obstructive sleep apnea)    does not use -last 6 months  . Pre-diabetes    per pt    Family History  Problem Relation Age of Onset  . Emphysema Mother        smoked  . Allergies Mother   . Asthma Mother   . Heart disease Mother   . Breast cancer Mother        with mets to Bone  . Prostate cancer Father   . Brain cancer Father   . Hypertension Brother   . Colon cancer Paternal Grandmother   . Allergic rhinitis Neg Hx   . Angioedema Neg Hx   . Eczema Neg Hx   . Immunodeficiency Neg Hx   . Urticaria Neg Hx     Past Surgical History:  Procedure Laterality Date  . ANKLE GANGLION CYST EXCISION Left   . BACK SURGERY    . birth mark treatments    . BREAST LUMPECTOMY WITH RADIOACTIVE SEED LOCALIZATION Left 02/02/2016   Procedure: LEFT BREAST LUMPECTOMY WITH RADIOACTIVE SEED LOCALIZATION;  Surgeon: Autumn Messing III, MD;  Location: [facility];  Service: General;  Laterality:  Left;  . BREAST SURGERY Right    papilloma removed,cyst lft  . CATARACT EXTRACTION Right 12/2016   Dr. Harlow Mares  . CHOLECYSTECTOMY N/A 02/20/2017   Procedure: LAPAROSCOPIC CHOLECYSTECTOMY WITH INTRAOPERATIVE CHOLANGIOGRAM;  Surgeon: Jovita Kussmaul, MD;  Location: [facility];  Service: General;  Laterality: N/A;  . COLON SURGERY  2008   resection- endometriosis   Social History   Occupational History  . 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 Smoking status: Never Smoker  . Smokeless tobacco: Never Used  . Tobacco comment: husband  smokes in the house  Substance and Sexual Activity  . Alcohol use: No  . Drug use: No  . Sexual activity: Yes    Partners: Male    Birth control/protection: None, Post-menopausal

## 2018-05-13 DIAGNOSIS — K648 Other hemorrhoids: Secondary | ICD-10-CM | POA: Insufficient documentation

## 2018-05-19 DIAGNOSIS — Z23 Encounter for immunization: Secondary | ICD-10-CM | POA: Diagnosis not present

## 2018-06-01 ENCOUNTER — Other Ambulatory Visit: Payer: Self-pay | Admitting: Internal Medicine

## 2018-06-03 ENCOUNTER — Ambulatory Visit
Admission: RE | Admit: 2018-06-03 | Discharge: 2018-06-03 | Disposition: A | Discharge status: Home / Self Care | Payer: 59 | Source: Ambulatory Visit | Attending: Orthopedic Surgery | Admitting: Orthopedic Surgery

## 2018-06-03 DIAGNOSIS — M25511 Pain in right shoulder: Principal | ICD-10-CM

## 2018-06-03 DIAGNOSIS — M542 Cervicalgia: Secondary | ICD-10-CM

## 2018-06-03 DIAGNOSIS — G8929 Other chronic pain: Secondary | ICD-10-CM

## 2018-06-03 DIAGNOSIS — S46011A Strain of muscle(s) and tendon(s) of the rotator cuff of right shoulder, initial encounter: Secondary | ICD-10-CM | POA: Diagnosis not present

## 2018-06-03 DIAGNOSIS — M79601 Pain in right arm: Secondary | ICD-10-CM

## 2018-06-03 DIAGNOSIS — M4802 Spinal stenosis, cervical region: Secondary | ICD-10-CM | POA: Diagnosis not present

## 2018-06-03 MED ORDER — IOPAMIDOL (ISOVUE-M 200) INJECTION 41%
12.0000 mL | Freq: Once | INTRAMUSCULAR | Status: AC
  Administered 2018-06-03: 12 mL via INTRA_ARTICULAR

## 2018-06-04 ENCOUNTER — Telehealth (INDEPENDENT_AMBULATORY_CARE_PROVIDER_SITE_OTHER): Payer: Self-pay | Admitting: Orthopedic Surgery

## 2018-06-04 NOTE — Telephone Encounter (Signed)
Patient called wanting to know if you had the results to her MRI's and if you would go over the results with her over the phone.  CB#[phone].  Thank you.

## 2018-06-04 NOTE — Telephone Encounter (Signed)
Please advise. Patient had MRI of neck and right shoulder done yesterday. Patient wanting results over phone.

## 2018-06-04 NOTE — Telephone Encounter (Signed)
I called she will call to schedule.

## 2018-06-15 ENCOUNTER — Ambulatory Visit (INDEPENDENT_AMBULATORY_CARE_PROVIDER_SITE_OTHER): Payer: 59 | Admitting: Orthopedic Surgery

## 2018-06-17 ENCOUNTER — Other Ambulatory Visit: Payer: 59

## 2018-06-23 DIAGNOSIS — K648 Other hemorrhoids: Secondary | ICD-10-CM | POA: Diagnosis not present

## 2018-06-25 ENCOUNTER — Telehealth (INDEPENDENT_AMBULATORY_CARE_PROVIDER_SITE_OTHER): Payer: Self-pay | Admitting: Orthopedic Surgery

## 2018-06-25 NOTE — Telephone Encounter (Signed)
Patient called asked when should she expect to have the sutures removed after surgery? Patient said she is having surgery 07/20/18. Patient advised Dr. Fredna Dow is giving her an injection in her finger after the sutures are removed.  The number to contact patient is [phone]

## 2018-06-25 NOTE — Telephone Encounter (Signed)
IC s/w patient and advised sutures for shoulder scopes usually removed at first post op visit providing incision looks ok.

## 2018-07-08 DIAGNOSIS — K648 Other hemorrhoids: Secondary | ICD-10-CM | POA: Diagnosis not present

## 2018-07-13 ENCOUNTER — Telehealth (INDEPENDENT_AMBULATORY_CARE_PROVIDER_SITE_OTHER): Payer: Self-pay | Admitting: Orthopedic Surgery

## 2018-07-13 NOTE — Telephone Encounter (Signed)
Kristina Webb, have you seen any forms for this patient?    IC patients surgery is going to be done at [facility] do not require pre op labs but should receive call from [facility] for pre op questioning.

## 2018-07-13 NOTE — Telephone Encounter (Signed)
I haven't received anything, even in today's work.

## 2018-07-13 NOTE — Telephone Encounter (Signed)
Patient called would like to know if she needs blood work before surgery on Monday,Ms. Deupree also wanted to know did Dr.Dean receive any disability paperwork?

## 2018-07-13 NOTE — Telephone Encounter (Signed)
IC patient back and advised. She will see if she can get copies of forms and bring them by office or have them refaxed.

## 2018-07-14 ENCOUNTER — Ambulatory Visit (INDEPENDENT_AMBULATORY_CARE_PROVIDER_SITE_OTHER): Payer: 59 | Admitting: Adult Health Nurse Practitioner

## 2018-07-14 ENCOUNTER — Encounter: Payer: Self-pay | Admitting: Adult Health Nurse Practitioner

## 2018-07-14 VITALS — BP 120/68 | HR 67 | Temp 97.2°F | Ht 63.0 in | Wt 214.6 lb

## 2018-07-14 DIAGNOSIS — R05 Cough: Secondary | ICD-10-CM | POA: Diagnosis not present

## 2018-07-14 DIAGNOSIS — K2100 Gastro-esophageal reflux disease with esophagitis, without bleeding: Secondary | ICD-10-CM

## 2018-07-14 DIAGNOSIS — R059 Cough, unspecified: Secondary | ICD-10-CM

## 2018-07-14 DIAGNOSIS — J4541 Moderate persistent asthma with (acute) exacerbation: Secondary | ICD-10-CM

## 2018-07-14 MED ORDER — ALBUTEROL SULFATE HFA 108 (90 BASE) MCG/ACT IN AERS: 2.0000 | INHALATION_SPRAY | Freq: Four times a day (QID) | RESPIRATORY_TRACT | 3 refills | Status: DC | PRN

## 2018-07-14 MED ORDER — FAMOTIDINE 40 MG PO TABS: 40.0000 mg | ORAL_TABLET | Freq: Every evening | ORAL | 1 refills | Status: DC

## 2018-07-14 MED ORDER — AZITHROMYCIN 250 MG PO TABS: ORAL_TABLET | ORAL | 0 refills | Status: AC

## 2018-07-14 NOTE — Progress Notes (Signed)
Assessment and Plan:  Armentha was seen today for acute visit and cough.  Diagnoses and all orders for this visit:  Moderate persistent asthma with exacerbation -     albuterol (PROAIR HFA) 108 (90 Base) MCG/ACT inhaler; Inhale 2 puffs into the lungs every 6 (six) hours as needed for wheezing. -     azithromycin (ZITHROMAX) 250 MG tablet; Take 2 tablets (500 mg) on  Day 1,  followed by 1 tablet (250 mg) once daily on Days 2 through 5. Discussed monitoring for yeast symptoms  Cough -     albuterol (PROAIR HFA) 108 (90 Base) MCG/ACT inhaler; Inhale 2 puffs into the lungs every 6 (six) hours as needed for wheezing. Take Zyrtec nightly  Gastroesophageal reflux disease with esophagitis -     famotidine (PEPCID) 40 MG tablet; Take 1 tablet (40 mg total) by mouth every evening. Discussed medication and side effects   Patient to call with new or worsening symptoms     Further disposition pending results of labs. Discussed med's effects and SE's.   Over 30 minutes of exam, counseling, chart review, and critical decision making was performed.   Future Appointments  Date Time Provider [place]  07/14/2018  4:00 PM Garnet Sierras, NP [facility] None  07/27/2018  1:45 PM Marlou Sa Tonna Corner, MD [facility] None  08/04/2018  4:00 PM Vicie Mutters, PA-C [facility] None  11/19/2018  3:00 PM Vicie Mutters, PA-C [facility] None    ------------------------------------------------------------------------------------------------------------------   HPI 62 y.o.female presents for cough and congestion for four days.  Reports that she has been taking mucinex DM.  Reports mild productive cough.  She has had to use the rescue inhalers twice a day over the past couple days.  She reports chest tightness and some wheezing  She is concerned that she is going to have shoulder surgery in 6 days.  She does not want to be turned away related to her recent sickness.    Past Medical History:  Diagnosis  Date  . Anemia    hx  . Anxiety   . Arthritis    back  . Asthma   . Depression   . Deviated septum   . GERD (gastroesophageal reflux disease)   . Headache   . History of hiatal hernia   . Hyperlipemia   . Hypertension   . Hypothyroidism   . OSA (obstructive sleep apnea)    does not use -last 6 months  . Pre-diabetes    per pt     Allergies  Allergen Reactions  . Levaquin [Levofloxacin In D5w] Other (See Comments)    Ankles hurt really bad ? ACHILLES TENDON ?  Marland Kitchen Doxycycline Other (See Comments)    UNSPECIFIED REACTION     Current Outpatient Medications on File Prior to Visit  Medication Sig  . acetaminophen (TYLENOL) 500 MG tablet Take 1,000 mg by mouth every 6 (six) hours as needed for mild pain, moderate pain or headache.  . albuterol (PROAIR HFA) 108 (90 Base) MCG/ACT inhaler Inhale 2 puffs into the lungs every 6 (six) hours as needed for wheezing.  Marland Kitchen amitriptyline (ELAVIL) 10 MG tablet TAKE 1 TO 2 TABLETS BY MOUTH DAILY AT BEDTIME (Patient taking differently: TAKE 10mg  BY MOUTH DAILY AT BEDTIME as needed for sleep)  . Cholecalciferol (VITAMIN D3) 10000 units capsule Take 10,000 Units by mouth daily.  . furosemide (LASIX) 40 MG tablet TAKE 1 TABLET EVERY MORNING FOR BLOOD PRESSURE AND FLUID (Patient taking differently: Take 40 mg by mouth daily  as needed for edema. Takes every three days)  . HYDROcodone-acetaminophen (NORCO) 5-325 MG tablet Take 1 tablet by mouth every 4 (four) hours as needed for moderate pain.  Marland Kitchen losartan (COZAAR) 100 MG tablet TAKE 1 TABLET BY MOUTH EVERY DAY  . metFORMIN (GLUCOPHAGE-XR) 500 MG 24 hr tablet Take 1 to 2 tablets 2 x / day with food for Diabetes (Patient taking differently: Take 500 mg by mouth 2 (two) times daily. )  . montelukast (SINGULAIR) 10 MG tablet Take 1 tablet (10 mg total) by mouth daily.  . pantoprazole (PROTONIX) 40 MG tablet TAKE 1 TABLET BY MOUTH EVERY DAY  . ranitidine (ZANTAC) 300 MG tablet TAKE TWICE A DAY WHILE TRYING TO  GET OFF PPI  . rosuvastatin (CRESTOR) 10 MG tablet Take 1 tablet (10 mg total) by mouth every Monday, Wednesday, and Friday.  . sertraline (ZOLOFT) 100 MG tablet TAKE 1 OR 2 TABLETS BY MOUTH DAILY FOR ANXIETY  . SYNTHROID 75 MCG tablet TAKE 1 TABLET DAILY   No current facility-administered medications on file prior to visit.     ROS: Review of Systems  HENT: Positive for congestion and sinus pain. Negative for ear discharge, ear pain, hearing loss, nosebleeds, sore throat and tinnitus.   Respiratory: Positive for cough, sputum production and wheezing. Negative for hemoptysis, shortness of breath and stridor.   Cardiovascular: Negative for chest pain, palpitations, orthopnea, claudication, leg swelling and PND.  Skin: Negative for itching and rash.      Physical Exam:  There were no vitals taken for this visit.  General Appearance: Well nourished, in no apparent distress. Eyes: PERRLA, EOMs, conjunctiva no swelling or erythema Sinuses: No Frontal/maxillary tenderness ENT/Mouth: Ext aud canals clear, Right TM, portwine stain noted, with scarring at 4o'clock. TMs without erythema, bulging. No erythema, swelling, or exudate on post pharynx.  Tonsils not swollen or erythematous. Hearing normal.  Neck: Supple, thyroid normal.  Respiratory: Respiratory effort normal, Bilateral wheezing noted. BS equal bilaterally without rales, rhonchi, or stridor.  Cardio: RRR with no MRGs. Brisk peripheral pulses without edema.  Abdomen: Soft, + BS.  Non tender, no guarding, rebound, hernias, masses. Lymphatics: Non tender without lymphadenopathy.  Skin: Warm, dry without rashes, lesions, ecchymosis. 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     Garnet Sierras, NP 8:03 AM [facility]

## 2018-07-14 NOTE — Patient Instructions (Addendum)
We have called Famotidine (Pepsid) 40mg  tablets to replace your zantac.  Azithromyacin, take two tablets today then on tablet for next four days Monitor for yeast symptoms Increase water intake Sent in a refill for your inhaler, use for wheezing  May add Zyrtec at night to help with allergy symptoms, draining into throat, nasal congestion.  Contact office with any new or worsening symptoms

## 2018-07-15 ENCOUNTER — Encounter (INDEPENDENT_AMBULATORY_CARE_PROVIDER_SITE_OTHER): Payer: Self-pay | Admitting: Orthopedic Surgery

## 2018-07-15 NOTE — Telephone Encounter (Signed)
ok 

## 2018-07-16 ENCOUNTER — Other Ambulatory Visit: Payer: Self-pay | Admitting: Internal Medicine

## 2018-07-16 MED ORDER — TRAMADOL HCL 50 MG PO TABS: ORAL_TABLET | ORAL | 0 refills | Status: DC

## 2018-07-16 MED ORDER — BENZONATATE 200 MG PO CAPS: ORAL_CAPSULE | ORAL | 1 refills | Status: DC

## 2018-07-16 MED ORDER — PREDNISONE 20 MG PO TABS: ORAL_TABLET | ORAL | 0 refills | Status: DC

## 2018-07-20 ENCOUNTER — Encounter: Payer: Self-pay | Admitting: Orthopedic Surgery

## 2018-07-20 DIAGNOSIS — G8918 Other acute postprocedural pain: Secondary | ICD-10-CM | POA: Diagnosis not present

## 2018-07-20 DIAGNOSIS — M75121 Complete rotator cuff tear or rupture of right shoulder, not specified as traumatic: Secondary | ICD-10-CM | POA: Diagnosis not present

## 2018-07-20 DIAGNOSIS — M659 Synovitis and tenosynovitis, unspecified: Secondary | ICD-10-CM | POA: Diagnosis not present

## 2018-07-20 DIAGNOSIS — S46111A Strain of muscle, fascia and tendon of long head of biceps, right arm, initial encounter: Secondary | ICD-10-CM | POA: Diagnosis not present

## 2018-07-20 DIAGNOSIS — M65811 Other synovitis and tenosynovitis, right shoulder: Secondary | ICD-10-CM | POA: Diagnosis not present

## 2018-07-27 ENCOUNTER — Ambulatory Visit (INDEPENDENT_AMBULATORY_CARE_PROVIDER_SITE_OTHER): Payer: 59

## 2018-07-27 ENCOUNTER — Encounter (INDEPENDENT_AMBULATORY_CARE_PROVIDER_SITE_OTHER): Payer: Self-pay | Admitting: Orthopedic Surgery

## 2018-07-27 ENCOUNTER — Ambulatory Visit (INDEPENDENT_AMBULATORY_CARE_PROVIDER_SITE_OTHER): Payer: 59 | Admitting: Orthopedic Surgery

## 2018-07-27 DIAGNOSIS — M25511 Pain in right shoulder: Secondary | ICD-10-CM

## 2018-07-27 DIAGNOSIS — G8929 Other chronic pain: Secondary | ICD-10-CM | POA: Diagnosis not present

## 2018-07-27 DIAGNOSIS — S46011D Strain of muscle(s) and tendon(s) of the rotator cuff of right shoulder, subsequent encounter: Secondary | ICD-10-CM

## 2018-07-27 NOTE — Progress Notes (Signed)
Post-Op Visit Note   Patient: Kristina Webb           Date of Birth: Apr 22, 1956           MRN: [id number] Visit Date: 07/27/2018 PCP: Unk Pinto, MD   Assessment & Plan:  Chief Complaint:  Chief Complaint  Patient presents with  . Right Shoulder - Follow-up   Visit Diagnoses:  1. Chronic right shoulder pain   2. Traumatic complete tear of right rotator cuff, subsequent encounter     Plan: Kristina Webb is a patient who is now about 8 days out right shoulder rotator cuff tear repair.  She was a little short of breath from her surgery.  She had a long-acting block and her shortness of breath improved and resolved after the block wore off.  Taking oxycodone and muscle relaxer as well as using CPM machine.  She is at 72 degrees.  Radiographs show no pneumothorax.  At this time I would let her discontinue the sling next Monday which will be 2 weeks in the sling.  Start physical therapy next week.  Continue CPM machine 1 hour 3 times a day.  Incisions look good today.  Portal sutures removed.  Follow-Up Instructions: Return in about 2 weeks (around 08/10/2018).   Orders:  Orders Placed This Encounter  Procedures  . XR Shoulder Right   No orders of the defined types were placed in this encounter.   Imaging: Xr Shoulder Right  Result Date: 07/27/2018 AP outlet axillary right shoulder reviewed.  Lung fields clear.  No pneumothorax.  Acromiohumeral distance maintained.  No adverse features status post rotator cuff surgery.   PMFS History: Patient Active Problem List   Diagnosis Date Noted  . Lower back pain 07/17/2015  . 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 Obesity, morbid ([place]) 03/29/2015  . CKD stage 2 due to type 2 diabetes mellitus ([place]) 04/15/2014  . Hyperlipemia   . Type 2 diabetes mellitus ([place])   . Anemia   . Asthma   . Depression   . OSA (obstructive sleep apnea)   . Hypertension   . GERD (gastroesophageal reflux disease)   . Cough 12/28/2012   Past  Medical History:  Diagnosis Date  . Anemia    hx  . Anxiety   . Arthritis    back  . Asthma   . Depression   . Deviated septum   . GERD (gastroesophageal reflux disease)   . Headache   . History of hiatal hernia   . Hyperlipemia   . Hypertension   . Hypothyroidism   . OSA (obstructive sleep apnea)    does not use -last 6 months  . Pre-diabetes    per pt    Family History  Problem Relation Age of Onset  . Emphysema Mother        smoked  . Allergies Mother   . Asthma Mother   . Heart disease Mother   . Breast cancer Mother        with mets to Bone  . Prostate cancer Father   . Brain cancer Father   . Hypertension Brother   . Colon cancer Paternal Grandmother   . Allergic rhinitis Neg Hx   . Angioedema Neg Hx   . Eczema Neg Hx   . Immunodeficiency Neg Hx   . Urticaria Neg Hx     Past Surgical History:  Procedure Laterality Date  . ANKLE GANGLION CYST EXCISION Left   . BACK SURGERY    .  birth mark treatments    . BREAST LUMPECTOMY WITH RADIOACTIVE SEED LOCALIZATION Left 02/02/2016   Procedure: LEFT BREAST LUMPECTOMY WITH RADIOACTIVE SEED LOCALIZATION;  Surgeon: Autumn Messing III, MD;  Location: [facility];  Service: General;  Laterality: Left;  . BREAST SURGERY Right    papilloma removed,cyst lft  . CATARACT EXTRACTION Right 12/2016   Dr. Harlow Mares  . CHOLECYSTECTOMY N/A 02/20/2017   Procedure: LAPAROSCOPIC CHOLECYSTECTOMY WITH INTRAOPERATIVE CHOLANGIOGRAM;  Surgeon: Jovita Kussmaul, MD;  Location: [facility];  Service: General;  Laterality: N/A;  . COLON SURGERY  2008   resection- endometriosis   Social History   Occupational History  . 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 Smoking status: Never Smoker  . Smokeless tobacco: Never Used  . Tobacco comment: husband smokes in the house  Substance and Sexual Activity  . Alcohol use: No  . Drug use: No  . Sexual activity: Yes    Partners: Male    Birth control/protection: None, Post-menopausal

## 2018-08-03 ENCOUNTER — Other Ambulatory Visit: Payer: Self-pay | Admitting: Internal Medicine

## 2018-08-04 ENCOUNTER — Encounter: Payer: Self-pay | Admitting: Physician Assistant

## 2018-08-04 ENCOUNTER — Ambulatory Visit (INDEPENDENT_AMBULATORY_CARE_PROVIDER_SITE_OTHER): Payer: 59 | Admitting: Physician Assistant

## 2018-08-04 VITALS — BP 122/70 | HR 97 | Temp 100.0°F | Ht 63.0 in | Wt 220.0 lb

## 2018-08-04 DIAGNOSIS — F329 Major depressive disorder, single episode, unspecified: Secondary | ICD-10-CM

## 2018-08-04 DIAGNOSIS — E1122 Type 2 diabetes mellitus with diabetic chronic kidney disease: Secondary | ICD-10-CM

## 2018-08-04 DIAGNOSIS — N182 Chronic kidney disease, stage 2 (mild): Secondary | ICD-10-CM

## 2018-08-04 DIAGNOSIS — I10 Essential (primary) hypertension: Secondary | ICD-10-CM | POA: Diagnosis not present

## 2018-08-04 DIAGNOSIS — F32A Depression, unspecified: Secondary | ICD-10-CM

## 2018-08-04 DIAGNOSIS — Q8589 Other phakomatoses, not elsewhere classified: Secondary | ICD-10-CM

## 2018-08-04 DIAGNOSIS — Q858 Other phakomatoses, not elsewhere classified: Secondary | ICD-10-CM

## 2018-08-04 DIAGNOSIS — E782 Mixed hyperlipidemia: Secondary | ICD-10-CM | POA: Diagnosis not present

## 2018-08-04 DIAGNOSIS — G4733 Obstructive sleep apnea (adult) (pediatric): Secondary | ICD-10-CM

## 2018-08-04 MED ORDER — BUPROPION HCL ER (XL) 150 MG PO TB24: 150.0000 mg | ORAL_TABLET | ORAL | 2 refills | Status: DC

## 2018-08-04 NOTE — Patient Instructions (Addendum)
Can try wellbutrin 150mg  with the zoloft If you feel that you are sweating more, diarrhea, or nervous can try to cut the zoloft in half or call me  RANGE OF A1C   Your A1C is a measure of your sugar over the past 3 months and is not affected by what you have eaten over the past few days. Diabetes increases your chances of stroke and heart attack over 300 % and is the leading cause of blindness and kidney failure in the [place]. Please make sure you decrease bad carbs like white bread, white rice, potatoes, corn, soft drinks, pasta, cereals, refined sugars, sweet tea, dried fruits, and fruit juice. Good carbs are okay to eat in moderation like sweet potatoes, [place] rice, whole grain pasta/bread, most fruit (except dried fruit) and you can eat as many veggies as you want.   Greater than 6.5 is considered diabetic. Between 6.4 and 5.7 is prediabetic If your A1C is less than 5.7 you are NOT diabetic.  Targets for Glucose Readings: Time of Check Target for patients WITHOUT Diabetes Target for DIABETICS  Before Meals Less than 100  less than 150  Two hours after meals Less than 200  Less than 250   What does your A1C results mean?  Your A1C is a measure of your sugar over the past 3 months   Use this chart as a guide to compare the results of your A1C blood test to your estimated average daily blood sugar:  A1C Range Average Sugar  4.0-6.0% 60-120 mg/dl  6.1-7.0% 121 - 150 mg/dl  7.1-8.0% 151-180 mg/dl  8.1-9.0% 181-210 mg/dl  10.1-11% 211-240 mg/dl  11.1-12.0% 271-300 mg/dl  12.1-13.0% 301-330 mg/dl  13.1-14.0% 331-360 mg/dl  Greater than 14.0% Greater than 360 mg/dl       When it comes to diets, agreement about the perfect plan isn't easy to find, even among the experts. Experts at the [employer] developed an idea known as the Healthy Eating Plate. Just imagine a plate divided into logical, healthy portions.  The emphasis is on diet quality:  Load up on  vegetables and fruits - one-half of your plate: Aim for color and variety, and remember that potatoes don't count.  Go for whole grains - one-quarter of your plate: Whole wheat, barley, wheat berries, quinoa, oats, Musson rice, and foods made with them. If you want pasta, go with whole wheat pasta.  Protein power - one-quarter of your plate: Fish, chicken, beans, and nuts are all healthy, versatile protein sources. Limit red meat.  The diet, however, does go beyond the plate, offering a few other suggestions.  Use healthy plant oils, such as olive, canola, soy, corn, sunflower and peanut. Check the labels, and avoid partially hydrogenated oil, which have unhealthy trans fats.  If you're thirsty, drink water. Coffee and tea are good in moderation, but skip sugary drinks and limit milk and dairy products to one or two daily servings.  The type of carbohydrate in the diet is more important than the amount. Some sources of carbohydrates, such as vegetables, fruits, whole grains, and beans-are healthier than others.  Finally, stay active.   Google mindful eating and here are some tips and tricks below.   Rate your hunger before you eat on a scale of 1-10, try to eat closer to a 6 or higher. And if you are at below that, why are you eating? Slow down and listen to your body.  Serotonin Syndrome Serotonin is a brain chemical that regulates the nervous system, which includes the brain, spinal cord, and nerves. Serotonin appears to play a role in all types of behavior, including appetite, emotions, movement, thinking, and response to stress. Excessively high levels of serotonin in the body can cause serotonin syndrome, which is a very dangerous condition. What are the causes? This condition can be caused by taking medicines or drugs that increase the level of serotonin in your body. These include:  Antidepressant medicines.  Migraine medicines.  Certain pain medicines.  Certain  recreational drugs, including ecstasy, LSD, cocaine, and amphetamines.  Over-the-counter cough or cold medicines that contain dextromethorphan.  Certain herbal supplements, including St. John's wort, ginseng, and nutmeg.  This condition usually occurs when you take these medicines or drugs in combination, but it can also happen with a high dose of a single medicine or drug. What increases the risk? This condition is more likely to develop in:  People who have recently increased the dosage of medicine that increases the serotonin level.  People who just started taking medicine that increases the serotonin level.  What are the signs or symptoms? Symptoms of this condition usually happens within several hours of a medicine change. Symptoms include:  Headache.  Muscle twitching or stiffness.  Diarrhea.  Confusion.  Restlessness or agitation.  Shivering or goose bumps.  Loss of muscle coordination.  Rapid heart rate.  Sweating.  Severe cases of serotonin syndromecan cause:  Irregular heartbeat.  Seizures.  Loss of consciousness.  High fever.  How is this diagnosed? This condition is diagnosed with a medical history and physical exam. You will be asked aboutyour symptoms and your use of medicines and recreational drugs. Your health care provider may also order lab work or additional tests to rule out other causes of your symptoms. How is this treated? The treatment for this condition depends on the severity of your symptoms. For mild cases, stopping the medicine that caused your condition is usually all that is needed. For moderate to severe cases, hospitalization is required to monitor you and to prevent further muscle damage. Follow these instructions at home:  Take over-the-counter and prescription medicines only as told by your health care provider. This is important.  Check with your health care provider before you start taking any new prescriptions,  over-the-counter medicines, herbs, or supplements.  Avoid combining any medicines that can cause this condition to occur.  Keep all follow-up visits as told by your health care provider.This is important.  Maintain a healthy lifestyle. ? Eat healthy foods. ? Get plenty of sleep. ? Exercise regularly. ? Do not drink alcohol. ? Do not use recreational drugs. Contact a health care provider if:  Medicines do not seem to be helping.  Your symptoms do not improve or they get worse.  You have trouble taking care of yourself. Get help right away if:  You have worsening confusion, severe headache, chest pain, high fever, seizures, or loss of consciousness.  You have serious thoughts about hurting yourself or others.  You experience serious side effects of medicine, such as swelling of your face, lips, tongue, or throat. This information is not intended to replace advice given to you by your health care provider. Make sure you discuss any questions you have with your health care provider. Document Released: 09/19/2004 Document Revised: 04/06/2016 Document Reviewed: 08/25/2014 Elsevier Interactive Patient Education  [employer].

## 2018-08-04 NOTE — Progress Notes (Signed)
FOLLOW UP  Assessment and Plan:   Hypertension Well controlled with current medications  Monitor blood pressure at home; patient to call if consistently greater than 130/80 Continue DASH diet.   Reminder to go to the ER if any CP, SOB, nausea, dizziness, severe HA, changes vision/speech, left arm numbness and tingling and jaw pain.  Cholesterol At goal Continue low cholesterol diet and exercise.  Check lipid panel.   Diabetes with diabetic chronic kidney disease Continue medication Continue diet and exercise.  Perform daily foot/skin check, notify office of any concerning changes.  Check A1C  Hypothyroidism continue medications the same pending lab results- has had constipation and fatigue reminded to take on an empty stomach 30-70mins before food.  check TSH level  Obesity with co morbidities Long discussion about weight loss, diet, and exercise Recommended diet heavy in fruits and veggies and low in animal meats, cheeses, and dairy products, appropriate calorie intake Patient will work on cutting down on sweets HAS OSA, FEELS NOT WORKING WELL FOR HER, WILL SEND TO NEURO Will follow up in 3 months   Continue diet and meds as discussed. Further disposition pending results of labs. Discussed med's effects and SE's.   Over 30 minutes of exam, counseling, chart review, and critical decision making was performed.   Future Appointments  Date Time Provider [place]  08/12/2018  1:15 PM Meredith Pel, MD [facility] None  11/19/2018  3:00 PM Vicie Mutters, PA-C [id number] None    ----------------------------------------------------------------------------------------------------------------------  HPI 62 y.o. female  presents for 3 month follow up on hypertension, cholesterol, diabetes, weight and vitamin D deficiency.  She has been off CPAP, wants to see Dr. Brett Fairy, sleep study 2016. Using nasal pillow, will set up referral.   She had shoulder surgery last 2  weeks, has been resting and not walking like she has so her weight is up. She starts PT Thursday.  BMI is Body mass index is 38.97 kg/m., she has not been working on diet due to recent surgery.  Wt Readings from Last 3 Encounters:  08/04/18 220 lb (99.8 kg)  07/14/18 214 lb 9.6 oz (97.3 kg)  04/29/18 213 lb 3.2 oz (96.7 kg)   Her blood pressure has been controlled at home, today their BP is BP: 122/70  She does workout. She denies chest pain, shortness of breath, dizziness.   She is on cholesterol medication (rosuvastatin 10 mg three times weekly, new medication) and denies myalgias. Her cholesterol is not at goal. The cholesterol last visit was:   Lab Results  Component Value Date   CHOL 159 04/29/2018   HDL 57 04/29/2018   LDLCALC 76 04/29/2018   TRIG 164 (H) 04/29/2018   CHOLHDL 2.8 04/29/2018    She has been working on diet and exercise for T2 diabetes on metformin, and denies foot ulcerations, increased appetite, nausea, paresthesia of the feet, polydipsia, polyuria, visual disturbances, vomiting and weight loss. Last A1C in the office was:  Lab Results  Component Value Date   HGBA1C 6.3 (H) 04/29/2018   She is on thyroid medication, she states she is very tired and constipated. Her medication was not changed last visit.   Lab Results  Component Value Date   TSH 2.35 04/29/2018   Patient is on Vitamin D supplement, taking 10000 IU 1-2 times weekly    Lab Results  Component Value Date   VD25OH 31 02/11/2018       Current Medications:  Current Outpatient Medications on File Prior to Visit  Medication Sig  . acetaminophen (TYLENOL) 500 MG tablet Take 1,000 mg by mouth every 6 (six) hours as needed for mild pain, moderate pain or headache.  . albuterol (PROAIR HFA) 108 (90 Base) MCG/ACT inhaler Inhale 2 puffs into the lungs every 6 (six) hours as needed for wheezing.  Marland Kitchen amitriptyline (ELAVIL) 10 MG tablet TAKE 1 TO 2 TABLETS BY MOUTH DAILY AT BEDTIME (Patient taking  differently: TAKE 10mg  BY MOUTH DAILY AT BEDTIME as needed for sleep)  . benzonatate (TESSALON) 200 MG capsule Take 1 perle 3 x / day to prevent cough  . Cholecalciferol (VITAMIN D3) 10000 units capsule Take 10,000 Units by mouth daily.  . famotidine (PEPCID) 40 MG tablet Take 1 tablet (40 mg total) by mouth every evening.  . furosemide (LASIX) 40 MG tablet TAKE 1 TABLET EVERY MORNING FOR BLOOD PRESSURE AND FLUID (Patient taking differently: Take 40 mg by mouth daily as needed for edema. Takes every three days)  . losartan (COZAAR) 100 MG tablet TAKE 1 TABLET BY MOUTH EVERY DAY  . montelukast (SINGULAIR) 10 MG tablet Take 1 tablet (10 mg total) by mouth daily.  . pantoprazole (PROTONIX) 40 MG tablet TAKE 1 TABLET BY MOUTH EVERY DAY  . predniSONE (DELTASONE) 20 MG tablet 1 tab 3 x day for 2 days, then 1 tab 2 x day for 2 days, then 1 tab 1 x day for 3 days  . rosuvastatin (CRESTOR) 10 MG tablet Take 1 tablet (10 mg total) by mouth every Monday, Wednesday, and Friday.  . sertraline (ZOLOFT) 100 MG tablet TAKE 1 OR 2 TABLETS BY MOUTH DAILY FOR ANXIETY  . SYNTHROID 75 MCG tablet TAKE 1 TABLET DAILY  . traMADol (ULTRAM) 50 MG tablet Take 1/2 to 1 tablet every 4 hours for severe cough  . metFORMIN (GLUCOPHAGE-XR) 500 MG 24 hr tablet Take 1 to 2 tablets 2 x / day with food for Diabetes (Patient taking differently: Take 500 mg by mouth 2 (two) times daily. )   No current facility-administered medications on file prior to visit.      Allergies:  Allergies  Allergen Reactions  . Levaquin [Levofloxacin In D5w] Other (See Comments)    Ankles hurt really bad ? ACHILLES TENDON ?  Marland Kitchen Doxycycline Other (See Comments)    UNSPECIFIED REACTION      Medical History:  Past Medical History:  Diagnosis Date  . Anemia    hx  . Anxiety   . Arthritis    back  . Asthma   . Depression   . Deviated septum   . GERD (gastroesophageal reflux disease)   . Headache   . History of hiatal hernia   .  Hyperlipemia   . Hypertension   . Hypothyroidism   . OSA (obstructive sleep apnea)    does not use -last 6 months  . Pre-diabetes    per pt   Family history- Reviewed and unchanged Social history- Reviewed and unchanged   Review of Systems:  Review of Systems  Constitutional: Negative for malaise/fatigue and weight loss.  HENT: Negative for hearing loss and tinnitus.   Eyes: Negative for blurred vision and double vision.  Respiratory: Negative for cough, shortness of breath and wheezing.   Cardiovascular: Negative for chest pain, palpitations, orthopnea, claudication and leg swelling.  Gastrointestinal: Negative for abdominal pain, blood in stool, constipation, diarrhea, heartburn, melena, nausea and vomiting.  Genitourinary: Negative.   Musculoskeletal: Negative for joint pain and myalgias.  Skin: Negative for rash.  Neurological: Negative  for dizziness, tingling, sensory change, weakness and headaches.  Endo/Heme/Allergies: Negative for polydipsia.  Psychiatric/Behavioral: Negative for depression and substance abuse. The patient is not nervous/anxious and does not have insomnia.   All other systems reviewed and are negative.     Physical Exam: BP 122/70   Pulse 97   Temp 100 F (37.8 C)   Ht 5\' 3"  (1.6 m)   Wt 220 lb (99.8 kg)   SpO2 97%   BMI 38.97 kg/m  Wt Readings from Last 3 Encounters:  08/04/18 220 lb (99.8 kg)  07/14/18 214 lb 9.6 oz (97.3 kg)  04/29/18 213 lb 3.2 oz (96.7 kg)   General Appearance: Well nourished, in no apparent distress. Eyes: PERRLA, EOMs, conjunctiva no swelling or erythema Sinuses: No Frontal/maxillary tenderness ENT/Mouth: Ext aud canals clear, TMs without erythema, bulging. No erythema, swelling, or exudate on post pharynx.  Tonsils not swollen or erythematous. Hearing normal.  Neck: Supple, thyroid normal.  Respiratory: Respiratory effort normal, BS equal bilaterally without rales, rhonchi, wheezing or stridor.  Cardio: RRR with no  MRGs. Brisk peripheral pulses without edema.  Abdomen: Soft, + BS,  mild epigastric tenderness, no guarding, rebound, hernias, masses. Lymphatics: Non tender without lymphadenopathy.  Musculoskeletal: Full ROM, 5/5 strength, Normal gait,  Skin: Port wine stain right face. Warm, dry without rashes, lesions, ecchymosis.  Neuro: Cranial nerves intact. Normal muscle tone, no cerebellar symptoms. Psych: Awake and oriented X 3, normal affect, Insight and Judgment appropriate   Vicie Mutters, PA-C 4:35 PM [facility]

## 2018-08-05 LAB — COMPLETE METABOLIC PANEL WITH GFR
AG Ratio: 1.4 (calc) (ref 1.0–2.5)
ALT: 30 U/L — ABNORMAL HIGH (ref 6–29)
AST: 24 U/L (ref 10–35)
Albumin: 4.1 g/dL (ref 3.6–5.1)
Alkaline phosphatase (APISO): 79 U/L (ref 33–130)
BUN: 18 mg/dL (ref 7–25)
CO2: 29 mmol/L (ref 20–32)
Calcium: 9.5 mg/dL (ref 8.6–10.4)
Chloride: 103 mmol/L (ref 98–110)
Creat: 0.93 mg/dL (ref 0.50–0.99)
GFR, Est African American: 77 mL/min/{1.73_m2} (ref >=60)
GFR, Est Non African American: 66 mL/min/{1.73_m2} (ref >=60)
Globulin: 3 g/dL (calc) (ref 1.9–3.7)
Glucose, Bld: 161 mg/dL — ABNORMAL HIGH (ref 65–99)
Potassium: 4.1 mmol/L (ref 3.5–5.3)
Sodium: 139 mmol/L (ref 135–146)
Total Bilirubin: 0.2 mg/dL (ref 0.2–1.2)
Total Protein: 7.1 g/dL (ref 6.1–8.1)

## 2018-08-05 LAB — CBC WITH DIFFERENTIAL/PLATELET
Basophils Absolute: 41 cells/uL (ref 0–200)
Basophils Relative: 0.4 %
Eosinophils Absolute: 350 cells/uL (ref 15–500)
Eosinophils Relative: 3.4 %
HCT: 35 % (ref 35.0–45.0)
Hemoglobin: 11.3 g/dL — ABNORMAL LOW (ref 11.7–15.5)
Lymphs Abs: 2647 cells/uL (ref 850–3900)
MCH: 25.9 pg — ABNORMAL LOW (ref 27.0–33.0)
MCHC: 32.3 g/dL (ref 32.0–36.0)
MCV: 80.1 fL (ref 80.0–100.0)
MPV: 10 fL (ref 7.5–12.5)
Monocytes Relative: 5.1 %
Neutro Abs: 6736 cells/uL (ref 1500–7800)
Neutrophils Relative %: 65.4 %
Platelets: 323 10*3/uL (ref 140–400)
RBC: 4.37 10*6/uL (ref 3.80–5.10)
RDW: 15.1 % — ABNORMAL HIGH (ref 11.0–15.0)
Total Lymphocyte: 25.7 %
WBC mixed population: 525 cells/uL (ref 200–950)
WBC: 10.3 10*3/uL (ref 3.8–10.8)

## 2018-08-05 LAB — TSH: TSH: 7.44 mIU/L — ABNORMAL HIGH (ref 0.40–4.50)

## 2018-08-05 LAB — HEMOGLOBIN A1C
Hgb A1c MFr Bld: 6.6 % of total Hgb — ABNORMAL HIGH (ref <5.7)
Mean Plasma Glucose: 143 (calc)
eAG (mmol/L): 7.9 (calc)

## 2018-08-05 LAB — LIPID PANEL
Cholesterol: 141 mg/dL (ref <200)
HDL: 52 mg/dL (ref >50)
LDL Cholesterol (Calc): 61 mg/dL (calc)
Non-HDL Cholesterol (Calc): 89 mg/dL (calc) (ref <130)
Total CHOL/HDL Ratio: 2.7 (calc) (ref <5.0)
Triglycerides: 201 mg/dL — ABNORMAL HIGH (ref <150)

## 2018-08-06 ENCOUNTER — Other Ambulatory Visit: Payer: Self-pay | Admitting: Internal Medicine

## 2018-08-06 DIAGNOSIS — M75121 Complete rotator cuff tear or rupture of right shoulder, not specified as traumatic: Secondary | ICD-10-CM | POA: Diagnosis not present

## 2018-08-10 DIAGNOSIS — M75121 Complete rotator cuff tear or rupture of right shoulder, not specified as traumatic: Secondary | ICD-10-CM | POA: Diagnosis not present

## 2018-08-11 ENCOUNTER — Ambulatory Visit (HOSPITAL_COMMUNITY)
Admission: RE | Admit: 2018-08-11 | Discharge: 2018-08-11 | Disposition: A | Discharge status: Home / Self Care | Payer: 59 | Source: Ambulatory Visit | Attending: Physician Assistant | Admitting: Physician Assistant

## 2018-08-11 DIAGNOSIS — R05 Cough: Secondary | ICD-10-CM | POA: Diagnosis not present

## 2018-08-11 DIAGNOSIS — R0602 Shortness of breath: Secondary | ICD-10-CM | POA: Diagnosis not present

## 2018-08-11 DIAGNOSIS — I10 Essential (primary) hypertension: Secondary | ICD-10-CM | POA: Diagnosis not present

## 2018-08-12 ENCOUNTER — Encounter (INDEPENDENT_AMBULATORY_CARE_PROVIDER_SITE_OTHER): Payer: Self-pay | Admitting: Orthopedic Surgery

## 2018-08-12 ENCOUNTER — Ambulatory Visit (INDEPENDENT_AMBULATORY_CARE_PROVIDER_SITE_OTHER): Payer: 59 | Admitting: Orthopedic Surgery

## 2018-08-12 DIAGNOSIS — M75121 Complete rotator cuff tear or rupture of right shoulder, not specified as traumatic: Secondary | ICD-10-CM | POA: Diagnosis not present

## 2018-08-12 DIAGNOSIS — S46011D Strain of muscle(s) and tendon(s) of the rotator cuff of right shoulder, subsequent encounter: Secondary | ICD-10-CM

## 2018-08-16 ENCOUNTER — Encounter (INDEPENDENT_AMBULATORY_CARE_PROVIDER_SITE_OTHER): Payer: Self-pay | Admitting: Orthopedic Surgery

## 2018-08-16 NOTE — Progress Notes (Signed)
Post-Op Visit Note   Patient: Kristina Webb           Date of Birth: 06/30/1956           MRN: [id number] Visit Date: 08/12/2018 PCP: Unk Pinto, MD   Assessment & Plan:  Chief Complaint:  Chief Complaint  Patient presents with  . Right Shoulder - Routine Post Op   Visit Diagnoses: No diagnosis found.  Plan: Kristina Webb is a patient who is now about 3 weeks out right shoulder rotator cuff repair.  On exam the incision is intact.  CPM machine is at 88.  Passively the range of motion is good.  No coarse grinding or crepitus with passive range of motion.  She is due to be out of work about 3 more weeks.  I like to see her in 4 weeks for clinical recheck and continue with physical therapy for passive range of motion and active assistive range of motion only.  She had a fairly sizable tear and I will make sure that gets tethered down to the bone before doing any strengthening exercises.  Follow-Up Instructions: Return in about 4 weeks (around 09/09/2018).   Orders:  No orders of the defined types were placed in this encounter.  No orders of the defined types were placed in this encounter.   Imaging: No results found.  PMFS History: Patient Active Problem List   Diagnosis Date Noted  . Lower back pain 07/17/2015  . 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 Obesity, morbid ([place]) 03/29/2015  . CKD stage 2 due to type 2 diabetes mellitus ([place]) 04/15/2014  . Hyperlipemia   . Type 2 diabetes mellitus ([place])   . Anemia   . Asthma   . Depression   . OSA (obstructive sleep apnea)   . Hypertension   . GERD (gastroesophageal reflux disease)   . Cough 12/28/2012   Past Medical History:  Diagnosis Date  . Anemia    hx  . Anxiety   . Arthritis    back  . Asthma   . Depression   . Deviated septum   . GERD (gastroesophageal reflux disease)   . Headache   . History of hiatal hernia   . Hyperlipemia   . Hypertension   . Hypothyroidism   . OSA (obstructive sleep apnea)    does not use -last 6 months  . Pre-diabetes    per pt    Family History  Problem Relation Age of Onset  . Emphysema Mother        smoked  . Allergies Mother   . Asthma Mother   . Heart disease Mother   . Breast cancer Mother        with mets to Bone  . Prostate cancer Father   . Brain cancer Father   . Hypertension Brother   . Colon cancer Paternal Grandmother   . Allergic rhinitis Neg Hx   . Angioedema Neg Hx   . Eczema Neg Hx   . Immunodeficiency Neg Hx   . Urticaria Neg Hx     Past Surgical History:  Procedure Laterality Date  . ANKLE GANGLION CYST EXCISION Left   . BACK SURGERY    . birth mark treatments    . BREAST LUMPECTOMY WITH RADIOACTIVE SEED LOCALIZATION Left 02/02/2016   Procedure: LEFT BREAST LUMPECTOMY WITH RADIOACTIVE SEED LOCALIZATION;  Surgeon: Autumn Messing III, MD;  Location: [place];  Service: General;  Laterality: Left;  . BREAST SURGERY Right    papilloma removed,cyst  lft  . CATARACT EXTRACTION Right 12/2016   Dr. Harlow Mares  . CHOLECYSTECTOMY N/A 02/20/2017   Procedure: LAPAROSCOPIC CHOLECYSTECTOMY WITH INTRAOPERATIVE CHOLANGIOGRAM;  Surgeon: Jovita Kussmaul, MD;  Location: [place];  Service: General;  Laterality: N/A;  . COLON SURGERY  2008   resection- endometriosis   Social History   Occupational History  . 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 Smoking status: Never Smoker  . Smokeless tobacco: Never Used  . Tobacco comment: husband smokes in the house  Substance and Sexual Activity  . Alcohol use: No  . Drug use: No  . Sexual activity: Yes    Partners: Male    Birth control/protection: None, Post-menopausal

## 2018-08-17 DIAGNOSIS — M75121 Complete rotator cuff tear or rupture of right shoulder, not specified as traumatic: Secondary | ICD-10-CM | POA: Diagnosis not present

## 2018-08-21 DIAGNOSIS — M75121 Complete rotator cuff tear or rupture of right shoulder, not specified as traumatic: Secondary | ICD-10-CM | POA: Diagnosis not present

## 2018-08-24 DIAGNOSIS — M75121 Complete rotator cuff tear or rupture of right shoulder, not specified as traumatic: Secondary | ICD-10-CM | POA: Diagnosis not present

## 2018-08-26 ENCOUNTER — Other Ambulatory Visit: Payer: Self-pay | Admitting: Physician Assistant

## 2018-08-27 ENCOUNTER — Telehealth: Payer: Self-pay | Admitting: Physician Assistant

## 2018-08-27 DIAGNOSIS — M75121 Complete rotator cuff tear or rupture of right shoulder, not specified as traumatic: Secondary | ICD-10-CM | POA: Diagnosis not present

## 2018-08-27 MED ORDER — PREDNISONE 20 MG PO TABS: ORAL_TABLET | ORAL | 0 refills | Status: DC

## 2018-08-27 MED ORDER — AZITHROMYCIN 250 MG PO TABS: ORAL_TABLET | ORAL | 1 refills | Status: AC

## 2018-08-27 MED ORDER — BENZONATATE 100 MG PO CAPS: 100.0000 mg | ORAL_CAPSULE | Freq: Three times a day (TID) | ORAL | 0 refills | Status: DC | PRN

## 2018-08-27 NOTE — Telephone Encounter (Signed)
63 y.o. female calls with 8 days of URI symptoms. She is on mucinex.  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  Medications Current Outpatient Medications on File Prior to Visit  Medication Sig  . acetaminophen (TYLENOL) 500 MG tablet Take 1,000 mg by mouth every 6 (six) hours as needed for mild pain, moderate pain or headache.  . albuterol (PROAIR HFA) 108 (90 Base) MCG/ACT inhaler Inhale 2 puffs into the lungs every 6 (six) hours as needed for wheezing.  Marland Kitchen amitriptyline (ELAVIL) 10 MG tablet TAKE 1 TO 2 TABLETS BY MOUTH DAILY AT BEDTIME (Patient taking differently: TAKE 10mg  BY MOUTH DAILY AT BEDTIME as needed for sleep)  . benzonatate (TESSALON) 200 MG capsule Take 1 perle 3 x / day to prevent cough  . buPROPion (WELLBUTRIN XL) 150 MG 24 hr tablet TAKE 1 TABLET BY MOUTH EVERY DAY IN THE MORNING  . Cholecalciferol (VITAMIN D3) 10000 units capsule Take 10,000 Units by mouth daily.  . famotidine (PEPCID) 40 MG tablet Take 1 tablet (40 mg total) by mouth every evening.  . furosemide (LASIX) 40 MG tablet TAKE 1 TABLET EVERY MORNING FOR BLOOD PRESSURE AND FLUID (Patient taking differently: Take 40 mg by mouth daily as needed for edema. Takes every three days)  . losartan (COZAAR) 100 MG tablet TAKE 1 TABLET BY MOUTH EVERY DAY  . metFORMIN (GLUCOPHAGE-XR) 500 MG 24 hr tablet Take 1 to 2 tablets 2 x / day with food for Diabetes (Patient taking differently: Take 500 mg by mouth 2 (two) times daily. )  . montelukast (SINGULAIR) 10 MG tablet Take 1 tablet (10 mg total) by mouth daily.  . pantoprazole (PROTONIX) 40 MG tablet TAKE  1 TABLET BY MOUTH EVERY DAY  . predniSONE (DELTASONE) 20 MG tablet 1 tab 3 x day for 2 days, then 1 tab 2 x day for 2 days, then 1 tab 1 x day for 3 days  . rosuvastatin (CRESTOR) 10 MG tablet Take 1 tablet (10 mg total) by mouth every Monday, Wednesday, and Friday.  . sertraline (ZOLOFT) 100 MG tablet TAKE 1 OR 2 TABLETS BY MOUTH DAILY FOR ANXIETY  . SYNTHROID 75 MCG tablet TAKE 1 TABLET DAILY  . traMADol (ULTRAM) 50 MG tablet Take 1/2 to 1 tablet every 4 hours for severe cough   No current facility-administered medications on file prior to visit.     Allergies  Allergen Reactions  . Levaquin [Levofloxacin In D5w] Other (See Comments)    Ankles hurt really bad ? ACHILLES TENDON ?  Marland Kitchen Doxycycline Other (See Comments)    UNSPECIFIED REACTION     I have prescribed I have prescribed Azithromyin 250 mg: two tablets now and then one tablet daily for 4 additonal days.  Make sure you are on an allergy pill such as claritin, allegra or zyrtec.  You may use an oral decongestant such as Mucinex D or if you have glaucoma or high blood pressure use plain Mucinex.  This is cough meds that you can take: A prescription cough medication called Tessalon Perles 100mg . You may take 1-2 capsules every 8 hours as needed for your cough.  If you develop worsening sinus pain, fever or  notice severe headache and vision changes, or if symptoms are not better after completion of antibiotic, please schedule an appointment with a health care provider. If you are getting worse please go to the ER.

## 2018-08-28 ENCOUNTER — Telehealth (INDEPENDENT_AMBULATORY_CARE_PROVIDER_SITE_OTHER): Payer: Self-pay | Admitting: Orthopedic Surgery

## 2018-08-28 NOTE — Telephone Encounter (Signed)
I called patient. She asked me to fax note to [phone] Attn: [place] number [id number] Faxed per patient request.

## 2018-08-28 NOTE — Telephone Encounter (Signed)
Patient called requesting to be out of work additional 3 weeks or write it for half days.  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  CB#[phone] or [phone].  Please advise patient.

## 2018-08-28 NOTE — Telephone Encounter (Signed)
Ok for note 

## 2018-08-28 NOTE — Telephone Encounter (Signed)
Okay for out of note work for 3 weeks

## 2018-08-31 DIAGNOSIS — M75121 Complete rotator cuff tear or rupture of right shoulder, not specified as traumatic: Secondary | ICD-10-CM | POA: Diagnosis not present

## 2018-09-02 ENCOUNTER — Ambulatory Visit (INDEPENDENT_AMBULATORY_CARE_PROVIDER_SITE_OTHER): Payer: BLUE CROSS/BLUE SHIELD | Admitting: Orthopedic Surgery

## 2018-09-02 ENCOUNTER — Encounter (INDEPENDENT_AMBULATORY_CARE_PROVIDER_SITE_OTHER): Payer: Self-pay | Admitting: Orthopedic Surgery

## 2018-09-02 ENCOUNTER — Telehealth (INDEPENDENT_AMBULATORY_CARE_PROVIDER_SITE_OTHER): Payer: Self-pay | Admitting: Orthopedic Surgery

## 2018-09-02 ENCOUNTER — Encounter (INDEPENDENT_AMBULATORY_CARE_PROVIDER_SITE_OTHER): Payer: Self-pay

## 2018-09-02 ENCOUNTER — Other Ambulatory Visit: Payer: BLUE CROSS/BLUE SHIELD

## 2018-09-02 DIAGNOSIS — S46011D Strain of muscle(s) and tendon(s) of the rotator cuff of right shoulder, subsequent encounter: Secondary | ICD-10-CM

## 2018-09-02 DIAGNOSIS — I10 Essential (primary) hypertension: Secondary | ICD-10-CM

## 2018-09-02 DIAGNOSIS — M75121 Complete rotator cuff tear or rupture of right shoulder, not specified as traumatic: Secondary | ICD-10-CM | POA: Diagnosis not present

## 2018-09-02 LAB — TSH: TSH: 2.99 mIU/L (ref 0.40–4.50)

## 2018-09-02 NOTE — Progress Notes (Signed)
Post-Op Visit Note   Patient: Kristina Webb           Date of Birth: 1955-10-21           MRN: [id number] Visit Date: 09/02/2018 PCP: Unk Pinto, MD   Assessment & Plan:  Chief Complaint:  Chief Complaint  Patient presents with  . Right Shoulder - Follow-up   Visit Diagnoses:  1. Traumatic complete tear of right rotator cuff, subsequent encounter     Plan: Kristina Webb is a patient who is now about 6 weeks out right shoulder rotator cuff repair.  Been doing reasonably well.  Having some tightness in the front.  Having hard time typing in terms of the lateral motion of that right arm.  Therapy note is reviewed sheet.  She is making good progress.  On my exam she does have good forward flexion abduction.  Plan at this time is to return to work January 22 1/2-day only for 2 weeks and then full-time thereafter.  Continue with therapy.  4-week return for clinical recheck.  Taking Tylenol for symptoms.  Follow-Up Instructions: Return in about 4 weeks (around 09/30/2018).   Orders:  No orders of the defined types were placed in this encounter.  No orders of the defined types were placed in this encounter.   Imaging: No results found.  PMFS History: Patient Active Problem List   Diagnosis Date Noted  . Lower back pain 07/17/2015  . 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 Obesity, morbid ([place]) 03/29/2015  . 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 Hyperlipemia   . Type 2 diabetes mellitus ([place])   . Anemia   . Asthma   . Depression   . OSA (obstructive sleep apnea)   . Hypertension   . GERD (gastroesophageal reflux disease)   . Cough 12/28/2012   Past Medical History:  Diagnosis Date  . Anemia    hx  . Anxiety   . Arthritis    back  . Asthma   . Depression   . Deviated septum   . GERD (gastroesophageal reflux disease)   . Headache   . History of hiatal hernia   . Hyperlipemia   . Hypertension   . Hypothyroidism   . OSA (obstructive  sleep apnea)    does not use -last 6 months  . Pre-diabetes    per pt    Family History  Problem Relation Age of Onset  . Emphysema Mother        smoked  . Allergies Mother   . Asthma Mother   . Heart disease Mother   . Breast cancer Mother        with mets to Bone  . Prostate cancer Father   . Brain cancer Father   . Hypertension Brother   . Colon cancer Paternal Grandmother   . Allergic rhinitis Neg Hx   . Angioedema Neg Hx   . Eczema Neg Hx   . Immunodeficiency Neg Hx   . Urticaria Neg Hx     Past Surgical History:  Procedure Laterality Date  . ANKLE GANGLION CYST EXCISION Left   . BACK SURGERY    . birth mark treatments    . BREAST LUMPECTOMY WITH RADIOACTIVE SEED LOCALIZATION Left 02/02/2016   Procedure: LEFT BREAST LUMPECTOMY WITH RADIOACTIVE SEED LOCALIZATION;  Surgeon: Autumn Messing III, MD;  Location: [facility];  Service: General;  Laterality: Left;  . BREAST SURGERY Right    papilloma removed,cyst lft  .  CATARACT EXTRACTION Right 12/2016   Dr. Harlow Mares  . CHOLECYSTECTOMY N/A 02/20/2017   Procedure: LAPAROSCOPIC CHOLECYSTECTOMY WITH INTRAOPERATIVE CHOLANGIOGRAM;  Surgeon: Jovita Kussmaul, MD;  Location: [facility];  Service: General;  Laterality: N/A;  . COLON SURGERY  2008   resection- endometriosis   Social History   Occupational History  . 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 Smoking status: Never Smoker  . Smokeless tobacco: Never Used  . Tobacco comment: husband smokes in the house  Substance and Sexual Activity  . Alcohol use: No  . Drug use: No  . Sexual activity: Yes    Partners: Male    Birth control/protection: None, Post-menopausal

## 2018-09-02 NOTE — Telephone Encounter (Signed)
Fax [phone] Patient called left VM would like to know if Dr.Dean can fax her work note to her employer

## 2018-09-03 NOTE — Telephone Encounter (Signed)
Faxed to provided number  

## 2018-09-08 DIAGNOSIS — M75121 Complete rotator cuff tear or rupture of right shoulder, not specified as traumatic: Secondary | ICD-10-CM | POA: Diagnosis not present

## 2018-09-10 DIAGNOSIS — M75121 Complete rotator cuff tear or rupture of right shoulder, not specified as traumatic: Secondary | ICD-10-CM | POA: Diagnosis not present

## 2018-09-14 DIAGNOSIS — M75121 Complete rotator cuff tear or rupture of right shoulder, not specified as traumatic: Secondary | ICD-10-CM | POA: Diagnosis not present

## 2018-09-20 ENCOUNTER — Other Ambulatory Visit: Payer: Self-pay | Admitting: Adult Health

## 2018-09-29 DIAGNOSIS — M75121 Complete rotator cuff tear or rupture of right shoulder, not specified as traumatic: Secondary | ICD-10-CM | POA: Diagnosis not present

## 2018-09-30 ENCOUNTER — Ambulatory Visit (INDEPENDENT_AMBULATORY_CARE_PROVIDER_SITE_OTHER): Payer: BLUE CROSS/BLUE SHIELD | Admitting: Orthopedic Surgery

## 2018-09-30 ENCOUNTER — Encounter (INDEPENDENT_AMBULATORY_CARE_PROVIDER_SITE_OTHER): Payer: Self-pay | Admitting: Orthopedic Surgery

## 2018-09-30 DIAGNOSIS — Z79899 Other long term (current) drug therapy: Secondary | ICD-10-CM

## 2018-09-30 DIAGNOSIS — S46011D Strain of muscle(s) and tendon(s) of the rotator cuff of right shoulder, subsequent encounter: Secondary | ICD-10-CM

## 2018-09-30 DIAGNOSIS — Z79891 Long term (current) use of opiate analgesic: Secondary | ICD-10-CM

## 2018-09-30 MED ORDER — HYDROCODONE-ACETAMINOPHEN 5-325 MG PO TABS: ORAL_TABLET | ORAL | 0 refills | Status: DC

## 2018-09-30 MED ORDER — METHOCARBAMOL 500 MG PO TABS: 500.0000 mg | ORAL_TABLET | Freq: Three times a day (TID) | ORAL | 0 refills | Status: DC | PRN

## 2018-10-01 ENCOUNTER — Encounter (INDEPENDENT_AMBULATORY_CARE_PROVIDER_SITE_OTHER): Payer: Self-pay | Admitting: Orthopedic Surgery

## 2018-10-01 NOTE — Progress Notes (Signed)
Post-Op Visit Note   Patient: Kristina Webb           Date of Birth: Feb 08, 1956           MRN: [id number] Visit Date: 09/30/2018 PCP: Unk Pinto, MD   Assessment & Plan:  Chief Complaint:  Chief Complaint  Patient presents with  . Right Shoulder - Follow-up   Visit Diagnoses:  1. Traumatic complete tear of right rotator cuff, subsequent encounter     Plan: Kristina Webb is a patient who is now about 2 and half months out right shoulder rotator cuff repair.  She is been in physical therapy.  She is been back to work for half days.  She is ready to go back full-time.  She does primarily typing.  On exam she has improving shoulder range of motion and strength with no coarse grinding or crepitus with passive range of motion.  I think we will let her return to regular work tomorrow.  Come back in 4 weeks for final check.  Norco and Robaxin refilled.  She will need to start weaning herself off of the narcotics over the next month.  Follow-Up Instructions: Return in about 4 weeks (around 10/28/2018).   Orders:  No orders of the defined types were placed in this encounter.  Meds ordered this encounter  Medications  . methocarbamol (ROBAXIN) 500 MG tablet    Sig: Take 1 tablet (500 mg total) by mouth every 8 (eight) hours as needed for muscle spasms.    Dispense:  30 tablet    Refill:  0  . HYDROcodone-acetaminophen (NORCO/VICODIN) 5-325 MG tablet    Sig: 1 tablet every 8 hours as needed for pain    Dispense:  30 tablet    Refill:  0    Imaging: No results found.  PMFS History: Patient Active Problem List   Diagnosis Date Noted  . Lower back pain 07/17/2015  . 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 Obesity, morbid ([place]) 03/29/2015  . 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 Hyperlipemia   . Type 2 diabetes mellitus ([place])   . Anemia   . Asthma   . Depression   . OSA (obstructive sleep apnea)   . Hypertension   . GERD (gastroesophageal reflux  disease)   . Cough 12/28/2012   Past Medical History:  Diagnosis Date  . Anemia    hx  . Anxiety   . Arthritis    back  . Asthma   . Depression   . Deviated septum   . GERD (gastroesophageal reflux disease)   . Headache   . History of hiatal hernia   . Hyperlipemia   . Hypertension   . Hypothyroidism   . OSA (obstructive sleep apnea)    does not use -last 6 months  . Pre-diabetes    per pt    Family History  Problem Relation Age of Onset  . Emphysema Mother        smoked  . Allergies Mother   . Asthma Mother   . Heart disease Mother   . Breast cancer Mother        with mets to Bone  . Prostate cancer Father   . Brain cancer Father   . Hypertension Brother   . Colon cancer Paternal Grandmother   . Allergic rhinitis Neg Hx   . Angioedema Neg Hx   . Eczema Neg Hx   . Immunodeficiency Neg Hx   . Urticaria Neg  Hx     Past Surgical History:  Procedure Laterality Date  . ANKLE GANGLION CYST EXCISION Left   . BACK SURGERY    . birth mark treatments    . BREAST LUMPECTOMY WITH RADIOACTIVE SEED LOCALIZATION Left 02/02/2016   Procedure: LEFT BREAST LUMPECTOMY WITH RADIOACTIVE SEED LOCALIZATION;  Surgeon: Autumn Messing III, MD;  Location: [facility];  Service: General;  Laterality: Left;  . BREAST SURGERY Right    papilloma removed,cyst lft  . CATARACT EXTRACTION Right 12/2016   Dr. Harlow Mares  . CHOLECYSTECTOMY N/A 02/20/2017   Procedure: LAPAROSCOPIC CHOLECYSTECTOMY WITH INTRAOPERATIVE CHOLANGIOGRAM;  Surgeon: Jovita Kussmaul, MD;  Location: [facility];  Service: General;  Laterality: N/A;  . COLON SURGERY  2008   resection- endometriosis   Social History   Occupational History  . 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 Smoking status: Never Smoker  . Smokeless tobacco: Never Used  . Tobacco comment: husband smokes in the house  Substance and Sexual Activity  . Alcohol use: No  . Drug use: No  . Sexual activity: Yes    Partners: Male    Birth control/protection: None,  Post-menopausal

## 2018-10-06 ENCOUNTER — Encounter (INDEPENDENT_AMBULATORY_CARE_PROVIDER_SITE_OTHER): Payer: BLUE CROSS/BLUE SHIELD

## 2018-10-08 ENCOUNTER — Institutional Professional Consult (permissible substitution): Payer: 59 | Admitting: Neurology

## 2018-10-13 DIAGNOSIS — Z961 Presence of intraocular lens: Secondary | ICD-10-CM | POA: Diagnosis not present

## 2018-10-13 DIAGNOSIS — H2512 Age-related nuclear cataract, left eye: Secondary | ICD-10-CM | POA: Diagnosis not present

## 2018-10-13 DIAGNOSIS — H5213 Myopia, bilateral: Secondary | ICD-10-CM | POA: Diagnosis not present

## 2018-10-20 ENCOUNTER — Ambulatory Visit (INDEPENDENT_AMBULATORY_CARE_PROVIDER_SITE_OTHER): Payer: BLUE CROSS/BLUE SHIELD | Admitting: Bariatrics

## 2018-10-22 ENCOUNTER — Encounter: Payer: Self-pay | Admitting: Adult Health

## 2018-10-26 ENCOUNTER — Other Ambulatory Visit: Payer: Self-pay

## 2018-10-26 MED ORDER — FUROSEMIDE 40 MG PO TABS: ORAL_TABLET | ORAL | 1 refills | Status: DC

## 2018-10-26 MED ORDER — LEVOTHYROXINE SODIUM 75 MCG PO TABS: 75.0000 ug | ORAL_TABLET | Freq: Every day | ORAL | 1 refills | Status: DC

## 2018-11-04 ENCOUNTER — Ambulatory Visit (INDEPENDENT_AMBULATORY_CARE_PROVIDER_SITE_OTHER): Payer: BLUE CROSS/BLUE SHIELD | Admitting: Bariatrics

## 2018-11-09 ENCOUNTER — Ambulatory Visit (INDEPENDENT_AMBULATORY_CARE_PROVIDER_SITE_OTHER): Payer: BLUE CROSS/BLUE SHIELD | Admitting: Orthopedic Surgery

## 2018-11-09 ENCOUNTER — Other Ambulatory Visit: Payer: Self-pay

## 2018-11-09 ENCOUNTER — Encounter (INDEPENDENT_AMBULATORY_CARE_PROVIDER_SITE_OTHER): Payer: Self-pay | Admitting: Orthopedic Surgery

## 2018-11-09 DIAGNOSIS — E1122 Type 2 diabetes mellitus with diabetic chronic kidney disease: Secondary | ICD-10-CM

## 2018-11-09 DIAGNOSIS — N182 Chronic kidney disease, stage 2 (mild): Secondary | ICD-10-CM

## 2018-11-09 DIAGNOSIS — I129 Hypertensive chronic kidney disease with stage 1 through stage 4 chronic kidney disease, or unspecified chronic kidney disease: Secondary | ICD-10-CM

## 2018-11-09 DIAGNOSIS — M25511 Pain in right shoulder: Secondary | ICD-10-CM

## 2018-11-09 DIAGNOSIS — W1812XD Fall from or off toilet with subsequent striking against object, subsequent encounter: Secondary | ICD-10-CM

## 2018-11-09 DIAGNOSIS — E039 Hypothyroidism, unspecified: Secondary | ICD-10-CM

## 2018-11-09 DIAGNOSIS — E785 Hyperlipidemia, unspecified: Secondary | ICD-10-CM

## 2018-11-09 DIAGNOSIS — S46011D Strain of muscle(s) and tendon(s) of the rotator cuff of right shoulder, subsequent encounter: Secondary | ICD-10-CM

## 2018-11-11 ENCOUNTER — Encounter (INDEPENDENT_AMBULATORY_CARE_PROVIDER_SITE_OTHER): Payer: Self-pay | Admitting: Orthopedic Surgery

## 2018-11-11 NOTE — Progress Notes (Signed)
Office Visit Note   Patient: Kristina Webb           Date of Birth: 06-May-1956           MRN: [id number] Visit Date: 11/09/2018 Requested by: Unk Pinto, [address] [place], [place] [address] PCP: Unk Pinto, MD  Subjective: Chief Complaint  Patient presents with  . Right Shoulder - Follow-up    HPI: Kristina Webb is a patient is now 4 months out right shoulder arthroscopy and rotator cuff repair.  She is working.  She has some occasional pain.  She uses topical plus Tylenol plus muscle relaxer and tramadol.  She also is using ice packs.  Was doing very well until couple weeks ago.  She does localize some of her pain to the superior aspect of the shoulder.              ROS: All systems reviewed are negative as they relate to the chief complaint within the history of present illness.  Patient denies  fevers or chills.   Assessment & Plan: Visit Diagnoses:  1. Traumatic complete tear of right rotator cuff, subsequent encounter     Plan: Impression is right shoulder pain 4 months out rotator cuff repair.  In general her range of motion looks good.  She is having little bit of AC joint tenderness and she may have a symptomatic AC joint.  I would favor observation for now but we could consider AC joint injection next if her symptoms do not improve.  In general the rotator cuff looks good and she still is lacking a little bit of full range of motion but I think that will improve over time as well.  Follow-up in 4 to 6 weeks if she is not improved we will consider ultrasound-guided [facility] joint injection.  Follow-Up Instructions: Return if symptoms worsen or fail to improve.   Orders:  No orders of the defined types were placed in this encounter.  No orders of the defined types were placed in this encounter.     Procedures: No procedures performed   Clinical Data: No additional findings.  Objective: Vital Signs: There were no vitals taken for this visit.   Physical Exam:   Constitutional: Patient appears well-developed HEENT:  Head: Normocephalic Eyes:EOM are normal Neck: Normal range of motion Cardiovascular: Normal rate Pulmonary/chest: Effort normal Neurologic: Patient is alert Skin: Skin is warm Psychiatric: Patient has normal mood and affect    Ortho Exam: Ortho exam demonstrates full active and passive range of motion of her cervical spine.  On the right-hand side she has good range of motion of the shoulder lacking about 10 degrees of full forward flexion abduction on the right compared to the left.  Rotator cuff strength is good and shoulder passive and active range of motion occurs without crepitus or grinding.  Mild AC joint tenderness to direct palpation on the right versus left  Specialty Comments:  No specialty comments available.  Imaging: No results found.   PMFS History: Patient Active Problem List   Diagnosis Date Noted  . Lower back pain 07/17/2015  . 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 Obesity, morbid ([place]) 03/29/2015  . CKD stage 2 due to type 2 diabetes mellitus ([place]) 04/15/2014  . Hyperlipemia   . Type 2 diabetes mellitus ([place])   . Anemia   . Asthma   . Depression   . OSA (obstructive sleep apnea)   . Hypertension   . GERD (gastroesophageal reflux disease)   .  Cough 12/28/2012   Past Medical History:  Diagnosis Date  . Anemia    hx  . Anxiety   . Arthritis    back  . Asthma   . Depression   . Deviated septum   . GERD (gastroesophageal reflux disease)   . Headache   . History of hiatal hernia   . Hyperlipemia   . Hypertension   . Hypothyroidism   . OSA (obstructive sleep apnea)    does not use -last 6 months  . Pre-diabetes    per pt    Family History  Problem Relation Age of Onset  . Emphysema Mother        smoked  . Allergies Mother   . Asthma Mother   . Heart disease Mother   . Breast cancer Mother        with mets to Bone  . Prostate cancer Father   . Brain cancer  Father   . Hypertension Brother   . Colon cancer Paternal Grandmother   . Allergic rhinitis Neg Hx   . Angioedema Neg Hx   . Eczema Neg Hx   . Immunodeficiency Neg Hx   . Urticaria Neg Hx     Past Surgical History:  Procedure Laterality Date  . ANKLE GANGLION CYST EXCISION Left   . BACK SURGERY    . birth mark treatments    . BREAST LUMPECTOMY WITH RADIOACTIVE SEED LOCALIZATION Left 02/02/2016   Procedure: LEFT BREAST LUMPECTOMY WITH RADIOACTIVE SEED LOCALIZATION;  Surgeon: Autumn Messing III, MD;  Location: [facility];  Service: General;  Laterality: Left;  . BREAST SURGERY Right    papilloma removed,cyst lft  . CATARACT EXTRACTION Right 12/2016   Dr. Harlow Mares  . CHOLECYSTECTOMY N/A 02/20/2017   Procedure: LAPAROSCOPIC CHOLECYSTECTOMY WITH INTRAOPERATIVE CHOLANGIOGRAM;  Surgeon: Jovita Kussmaul, MD;  Location: [facility];  Service: General;  Laterality: N/A;  . COLON SURGERY  2008   resection- endometriosis   Social History   Occupational History  . 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 Smoking status: Never Smoker  . Smokeless tobacco: Never Used  . Tobacco comment: husband smokes in the house  Substance and Sexual Activity  . Alcohol use: No  . Drug use: No  . Sexual activity: Yes    Partners: Male    Birth control/protection: None, Post-menopausal

## 2018-11-18 ENCOUNTER — Other Ambulatory Visit: Payer: Self-pay | Admitting: Physician Assistant

## 2018-11-18 NOTE — Progress Notes (Deleted)
Complete Physical  Assessment and Plan:  Diagnoses and all orders for this visit:  Encounter for general adult medical examination with abnormal findings  Essential hypertension Continue medications Monitor blood pressure at home; call if consistently over 130/80 Continue DASH diet.   Reminder to go to the ER if any CP, SOB, nausea, dizziness, severe HA, changes vision/speech, left arm numbness and tingling and jaw pain. -     EKG 12-Lead  Sturge-Weber syndrome (HCC)  Uncomplicated asthma, unspecified asthma severity, unspecified whether persistent Continue inhalers, well managed, f/u allergist/pulmonology as needed  OSA (obstructive sleep apnea) CPAP has been recommended  Gastroesophageal reflux disease without esophagitis Well managed on current medications; will not taper PPI per recent EGD and GI recommendation Discussed diet, avoiding triggers and other lifestyle changes  Type 2 diabetes mellitus with stage 2 chronic kidney disease, without long-term current use of insulin ([place]) Discussed general issues about diabetes pathophysiology and management., Educational material distributed., Suggested low cholesterol diet., Encouraged aerobic exercise., Discussed foot care., Reminded to get yearly retinal exam and requested reports -     CMP/GFR -     Hemoglobin A1c -     Microalbumin / creatinine urine ratio  CKD (chronic kidney disease) stage 2, GFR 60-89 ml/min Increase fluids, avoid NSAIDS, monitor sugars, will monitor -     CMP/GFR -     Microalbumin / creatinine urine ratio  Anemia, unspecified type -     CBC with Differential/Platelet -     Vitamin B12 -     Iron,Total/Total Iron Binding Cap ***  Depression, unspecified depression type Continue medications  Lifestyle discussed: diet/exerise, sleep hygiene, stress management, hydration  Hyperlipidemia, unspecified hyperlipidemia type Currently treated by lifestyle only; discussed possible benefit of statin r/t  DM Continue low cholesterol diet and exercise.  -     Lipid panel -     TSH  Obesity, morbid ([place]) Long discussion about weight loss, diet, and exercise Recommended diet heavy in fruits and veggies and low in animal meats, cheeses, and dairy products, appropriate calorie intake Patient will work on calorie tracking via app, suggestions made, guidelines for exercise and vegetable/fruit/beans intake discussed Discussed appropriate weight for height  Follow up at next visit  Vitamin D deficiency Patient has not been supplementing despite recommendation; discussed goals and benefits  -     VITAMIN D 25 Hydroxy (Vit-D Deficiency, Fractures)  Medication management -     CBC with Differential/Platelet -     CMP/GFR -     Urinalysis w microscopic + reflex cultur   Discussed med's effects and SE's. Screening labs and tests as requested with regular follow-up as recommended. Over 40 minutes of exam, counseling, chart review, and complex, high level critical decision making was performed this visit.   Future Appointments  Date Time Provider [place]  11/19/2018 11:00 AM Kristina Comber, NP [facility] None  12/10/2018  3:00 PM Dohmeier, Asencion Partridge, MD [facility] None  11/24/2019  3:00 PM Kristina Mutters, PA-C [facility] None     HPI  63 y.o. 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    She is recovering from R cataract surgery from this  past year; at 6 month follow up "scarring" was noted and has follow up soon to discuss this with Dr. Harlow Mares.   She reports she has some ongoing RUQ  "achiness" ongoing since after gallbladder surgery; she also has hx of partial colectomy r/t endometriosis. She recently completed EGD by GI but was unable to complete scheduled colonoscopy due to inadequate prep and has this scheduled for March. She plans to discuss ongoing mild pain at that time.    *** pain meds ***  BMI is There is no height or weight on file to calculate BMI., she has been working on diet and exercise. Wt Readings from Last 3 Encounters:  08/04/18 220 lb (99.8 kg)  07/14/18 214 lb 9.6 oz (97.3 kg)  04/29/18 213 lb 3.2 oz (96.7 kg)   Her blood pressure has been controlled at home, today their BP is   She does not workout. She denies chest pain, shortness of breath, dizziness.   She is not on cholesterol medication and denies myalgias. Her cholesterol is not at goal. The cholesterol last visit was:   Lab Results  Component Value Date   CHOL 141 08/04/2018   HDL 52 08/04/2018   LDLCALC 61 08/04/2018   TRIG 201 (H) 08/04/2018   CHOLHDL 2.7 08/04/2018    She has been working on diet and exercise for T2DM, she is not on bASA, she is on ACE/ARB and denies increased appetite, nausea, paresthesia of the feet, polydipsia, polyuria, visual disturbances, vomiting and weight loss. Last A1C in the office was:  Lab Results  Component Value Date   HGBA1C 6.6 (H) 08/04/2018   She is on thyroid medication. Her medication was not changed last visit.   Lab Results  Component Value Date   TSH 2.99 09/02/2018   Last GFR: Lab Results  Component Value Date   GFRNONAA 66 08/04/2018   Patient is *** not on Vitamin D supplement and well below goal at the last check:    Lab Results  Component Value Date   VD25OH 31 02/11/2018       *** Lab Results  Component Value Date   VITAMINB12 487 10/22/2017   Lab Results  Component Value Date   IRON 27 (L) 10/22/2017   TIBC 434 10/22/2017   FERRITIN 17 07/17/2015     Current Medications:  Current Outpatient Medications on  File Prior to Visit  Medication Sig Dispense Refill  . acetaminophen (TYLENOL) 500 MG tablet Take 1,000 mg by mouth every 6 (six) hours as needed for mild pain, moderate pain or headache.    . albuterol (PROAIR HFA) 108 (90 Base) MCG/ACT inhaler Inhale 2 puffs into the lungs every 6 (six) hours as needed for wheezing. 3 Inhaler 3  . amitriptyline (ELAVIL) 10 MG tablet TAKE 1 TO 2 TABLETS BY MOUTH DAILY AT BEDTIME (Patient taking differently: TAKE 10mg  BY MOUTH DAILY AT BEDTIME as needed for sleep) 180 tablet 1  . benzonatate (TESSALON PERLES) 100 MG capsule Take 1 capsule (100 mg total) by mouth 3 (three) times daily as needed for cough (Max: 600mg  per day). 60 capsule 0  . buPROPion (WELLBUTRIN XL) 150 MG 24 hr tablet TAKE 1 TABLET BY MOUTH EVERY DAY IN THE MORNING 90 tablet 4  . Cholecalciferol (VITAMIN D3) 10000 units capsule Take 10,000 Units by mouth daily.    . famotidine (PEPCID) 40 MG tablet Take 1 tablet (40 mg total) by mouth every evening. 30 tablet 1  . furosemide (LASIX) 40 MG tablet  TAKE 1 TABLET EVERY MORNING FOR BLOOD PRESSURE AND FLUID 90 tablet 1  . HYDROcodone-acetaminophen (NORCO/VICODIN) 5-325 MG tablet 1 tablet every 8 hours as needed for pain 30 tablet 0  . levothyroxine (SYNTHROID) 75 MCG tablet Take 1 tablet (75 mcg total) by mouth daily. 90 tablet 1  . losartan (COZAAR) 100 MG tablet TAKE 1 TABLET BY MOUTH EVERY DAY 90 tablet 1  . metFORMIN (GLUCOPHAGE-XR) 500 MG 24 hr tablet Take 1 to 2 tablets 2 x / day with food for Diabetes (Patient taking differently: Take 500 mg by mouth 2 (two) times daily. ) 360 tablet 1  . methocarbamol (ROBAXIN) 500 MG tablet Take 1 tablet (500 mg total) by mouth every 8 (eight) hours as needed for muscle spasms. 30 tablet 0  . montelukast (SINGULAIR) 10 MG tablet Take 1 tablet (10 mg total) by mouth daily. 90 tablet 1  . pantoprazole (PROTONIX) 40 MG tablet TAKE 1 TABLET BY MOUTH EVERY DAY 90 tablet 1  . predniSONE (DELTASONE) 20 MG tablet 2  tablets daily for 3 days, 1 tablet daily for 4 days. 10 tablet 0  . rosuvastatin (CRESTOR) 10 MG tablet TAKE 1 TABLET BY MOUTH EVERY MONDAY, WEDNESDAY, AND FRIDAY 60 tablet 1  . sertraline (ZOLOFT) 100 MG tablet TAKE 1 OR 2 TABLETS BY MOUTH DAILY FOR ANXIETY 180 tablet 1  . traMADol (ULTRAM) 50 MG tablet Take 1/2 to 1 tablet every 4 hours for severe cough 30 tablet 0   No current facility-administered medications on file prior to visit.    Allergies:  Allergies  Allergen Reactions  . Levaquin [Levofloxacin In D5w] Other (See Comments)    Ankles hurt really bad ? ACHILLES TENDON ?  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 Health Maintenance:   Immunization History  Administered Date(s) Administered  . Influenza Split 04/26/2013, 05/29/2015  . Influenza Whole 05/26/2012  . Influenza-Unspecified 05/19/2018  . Pneumococcal Polysaccharide-23 09/10/2016  . Td 08/27/2007, 10/22/2017   Tetanus: 2019 Pneumovax: 2018 Prevnar due age 62 Flu vaccine: 2019 Zostavax: n/a  LMP:2007 Pap: 2018- Dr. Willis Modena -  MGM: 12/2017 - Dr. Willis Modena DEXA:  Colonoscopy: 12/2012 normal - has scheduled for has scheduled for 11/20/2017 EGD: 09/2017 [facility] - report not visible - by Dr. Earlean Shawl, report requested  PFT: 10/2016 CXr 2018 Ct head 04/2015  Last Dental Exam: Dr. Gloriann Loan, 2018 q 6 months.  Last Eye Exam: Dr. Claudean Kinds 10/28/2017, has cataracts and going next month  NEED REPORTS- requested   Patient Care Team: Unk Pinto, MD as PCP - General (Internal  Medicine) Newt Minion, MD as Consulting Physician (Orthopedic Surgery) Love, Alyson Locket, MD as Consulting Physician (Neurology) Ralene Bathe, MD as Consulting Physician (Ophthalmology) Gastroenterology, Sadie Haber as Consulting Physician (Gastroenterology)  Surgical History:  She has a past surgical history that includes Back surgery; Ankle ganglion cyst excision (Left); birth mark treatments; Breast lumpectomy with radioactive seed localization (Left, 02/02/2016); Breast surgery (Right); Colon surgery (2008); Cholecystectomy (N/A, 02/20/2017); and Cataract extraction (Right, 12/2016). Family History:  Herfamily history includes Allergies in her mother; Asthma in her mother; Brain cancer in her  father; Breast cancer in her mother; Colon cancer in her paternal grandmother; Emphysema in her mother; Heart disease in her mother; Hypertension in her brother; Prostate cancer in her father. Social History:  She reports that she has never smoked. She has never used smokeless tobacco. She reports that she does not drink alcohol or use drugs.  Review of Systems: Review of Systems  Constitutional: Negative for malaise/fatigue and weight loss.  HENT: Negative for hearing loss and tinnitus.   Eyes: Positive for blurred vision (Ongoing issues, bilaterally, followed by Dr. Harlow Mares). Negative for double vision, pain, discharge and redness.  Respiratory: Negative for cough, hemoptysis, sputum production, shortness of breath and wheezing.   Cardiovascular: Negative for chest pain, palpitations, orthopnea, claudication and leg swelling.  Gastrointestinal: Negative for abdominal pain, blood in stool, constipation, diarrhea, heartburn, melena, nausea and vomiting.  Genitourinary: Negative.   Musculoskeletal: Negative for falls, joint pain and myalgias.  Skin: Negative for rash.  Neurological: Positive for headaches (R/t vision issues, following up with Dr. Harlow Mares). Negative for dizziness, tingling, sensory change and  weakness.  Endo/Heme/Allergies: Negative for polydipsia.  Psychiatric/Behavioral: Negative for depression, memory loss, substance abuse and suicidal ideas. The patient is nervous/anxious and has insomnia.   All other systems reviewed and are negative.   Physical Exam: Estimated body mass index is 38.97 kg/m as calculated from the following:   Height as of 08/04/18: 5\' 3"  (1.6 m).   Weight as of 08/04/18: 220 lb (99.8 kg). There were no vitals taken for this visit. General Appearance: Well nourished, in no apparent distress.  Eyes: PERRLA, EOMs, conjunctiva no swelling or erythema, normal fundi and vessels.  Sinuses: No Frontal/maxillary tenderness  ENT/Mouth: Ext aud canals clear, normal light reflex with TMs without erythema, bulging. Good dentition. No erythema, swelling, or exudate on post pharynx. Tonsils not swollen or erythematous. Hearing normal.  Neck: Supple, thyroid normal. No bruits  Respiratory: Respiratory effort normal, BS equal bilaterally without rales, rhonchi, wheezing or stridor.  Cardio: RRR without murmurs, rubs or gallops. Brisk peripheral pulses without edema.  Chest: symmetric, with normal excursions and percussion.  Breasts: Defer to GYN Abdomen: Soft, nontender, no guarding, rebound, hernias, masses, or organomegaly.  Lymphatics: Non tender without lymphadenopathy.  Genitourinary: Defer to GYN Musculoskeletal: Full ROM all peripheral extremities,5/5 strength, and normal gait.  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  Neuro: Cranial nerves intact, reflexes equal bilaterally. Normal muscle tone, no cerebellar symptoms. Sensation intact.  Psych: Awake and oriented X 3, normal affect, Insight and Judgment appropriate.   EKG: WNL no ST changes.  Gorden Harms Romaldo Sav[place] 5:10 PM [facility]

## 2018-11-19 ENCOUNTER — Telehealth: Payer: BLUE CROSS/BLUE SHIELD | Admitting: Physician Assistant

## 2018-11-19 ENCOUNTER — Encounter: Payer: Self-pay | Admitting: Physician Assistant

## 2018-11-19 ENCOUNTER — Encounter: Payer: Self-pay | Admitting: Adult Health

## 2018-11-19 DIAGNOSIS — R05 Cough: Secondary | ICD-10-CM | POA: Diagnosis not present

## 2018-11-19 DIAGNOSIS — R059 Cough, unspecified: Secondary | ICD-10-CM

## 2018-11-19 MED ORDER — AZITHROMYCIN 250 MG PO TABS: ORAL_TABLET | ORAL | 1 refills | Status: AC

## 2018-11-19 NOTE — Telephone Encounter (Signed)
HIS ENCOUNTER IS A VIRTUAL VISIT DUE TO COVID-19 - PATIENT WAS NOT SEEN IN THE OFFICE. PATIENT HAS CONSENTED TO VIRTUAL VISIT / TELEMEDICINE VISIT  Virtual Visit via Video Note  I connected with Kristina Webb on @TODAY @ at  by a video enabled telemedicine application and verified that I am speaking with the correct person using two identifiers.   I discussed the limitations of evaluation and management by telemedicine and the availability of in person appointments. The patient expressed understanding and agreed to proceed.  History of Present Illness: Cough x 2-3 days off and on .  She has no travel history and no possible exposure to COVID 19 patient. No fever, no SOB. On allergy pill, nasal spray.   Medications  Current Outpatient Medications (Endocrine & Metabolic):  .  levothyroxine (SYNTHROID) 75 MCG tablet, Take 1 tablet (75 mcg total) by mouth daily. .  metFORMIN (GLUCOPHAGE-XR) 500 MG 24 hr tablet, Take 1 to 2 tablets 2 x / day with food for Diabetes (Patient taking differently: Take 500 mg by mouth 2 (two) times daily. ) .  predniSONE (DELTASONE) 20 MG tablet, 2 tablets daily for 3 days, 1 tablet daily for 4 days.  Current Outpatient Medications (Cardiovascular):  .  furosemide (LASIX) 40 MG tablet, TAKE 1 TABLET EVERY MORNING FOR BLOOD PRESSURE AND FLUID .  losartan (COZAAR) 100 MG tablet, TAKE 1 TABLET BY MOUTH EVERY DAY .  rosuvastatin (CRESTOR) 10 MG tablet, TAKE 1 TABLET BY MOUTH EVERY MONDAY, WEDNESDAY, AND FRIDAY  Current Outpatient Medications (Respiratory):  .  albuterol (PROAIR HFA) 108 (90 Base) MCG/ACT inhaler, Inhale 2 puffs into the lungs every 6 (six) hours as needed for wheezing. .  benzonatate (TESSALON PERLES) 100 MG capsule, Take 1 capsule (100 mg total) by mouth 3 (three) times daily as needed for cough (Max: 600mg  per day). .  montelukast (SINGULAIR) 10 MG tablet, Take 1 tablet (10 mg total) by mouth daily.  Current Outpatient Medications (Analgesics):  .   acetaminophen (TYLENOL) 500 MG tablet, Take 1,000 mg by mouth every 6 (six) hours as needed for mild pain, moderate pain or headache. Marland Kitchen  HYDROcodone-acetaminophen (NORCO/VICODIN) 5-325 MG tablet, 1 tablet every 8 hours as needed for pain .  traMADol (ULTRAM) 50 MG tablet, Take 1/2 to 1 tablet every 4 hours for severe cough   Current Outpatient Medications (Other):  .  amitriptyline (ELAVIL) 10 MG tablet, TAKE 1 TO 2 TABLETS BY MOUTH DAILY AT BEDTIME (Patient taking differently: TAKE 10mg  BY MOUTH DAILY AT BEDTIME as needed for sleep) .  buPROPion (WELLBUTRIN XL) 150 MG 24 hr tablet, TAKE 1 TABLET BY MOUTH EVERY DAY IN THE MORNING .  Cholecalciferol (VITAMIN D3) 10000 units capsule, Take 10,000 Units by mouth daily. .  famotidine (PEPCID) 40 MG tablet, Take 1 tablet (40 mg total) by mouth every evening. .  methocarbamol (ROBAXIN) 500 MG tablet, Take 1 tablet (500 mg total) by mouth every 8 (eight) hours as needed for muscle spasms. .  pantoprazole (PROTONIX) 40 MG tablet, TAKE 1 TABLET BY MOUTH EVERY DAY .  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   Observations/Objective: General: in no apparent distress.  ENT/Mouth: No hoarseness, mild cough during visit Respiratory: Respiratory effort normal, able to complete sentences without SOB Neuro: Awake and oriented X 3,  Psych:   Insight and Judgment appropriate.   Assessment and Plan: URI versus COVID Currently mild symptoms Suggested symptomatic OTC remedies. Will hold the zpak and take if she  is not getting better, increase fluids, rest, cont allergy pill Nasal saline spray for congestion. Nasal steroids, allergy pill, staying hydrated Follow up as needed via mychart or text.. Will do self isolation at home Patient understands will not break quarantine without my permission.    Follow Up Instructions: Sent via mychart   I discussed the assessment and treatment plan with the patient. The patient was provided an opportunity to ask questions and all were answered. The patient agreed with the plan and demonstrated an understanding of the instructions.   The patient was advised to call back or seek an in-person evaluation if the symptoms worsen or if the condition fails to improve as anticipated.  I provided 20-30 minutes of non-face-to-face time during this encounter.   Vicie Mutters, PA-C

## 2018-12-02 ENCOUNTER — Telehealth: Payer: Self-pay | Admitting: Neurology

## 2018-12-02 NOTE — Telephone Encounter (Signed)
Due to current COVID 19 pandemic, our office is severely reducing in office visits for at least the next 2 weeks, in order to minimize the risk to our patients and healthcare providers.  I informed her of the video visits we are offering and patient declined at this time. We have pushed the apt out til June 4th at 10:00 am with check in of 9:30 am.

## 2018-12-08 ENCOUNTER — Other Ambulatory Visit: Payer: Self-pay | Admitting: Adult Health

## 2018-12-10 ENCOUNTER — Institutional Professional Consult (permissible substitution): Payer: BLUE CROSS/BLUE SHIELD | Admitting: Neurology

## 2018-12-23 ENCOUNTER — Other Ambulatory Visit: Payer: Self-pay | Admitting: Internal Medicine

## 2019-01-11 NOTE — Progress Notes (Signed)
Complete Physical  Assessment and Plan:  [place] was seen today for annual exam.  Diagnoses and all orders for this visit:  Encounter for annual physical exam -     Hemoglobin A1c -     Insulin, random -     VITAMIN D 25 Hydroxy (Vit-D Deficiency, Fractures) -     Urinalysis w microscopic + reflex cultur -     HM DIABETES FOOT EXAM -     EKG 12-Lead -     Microalbumin / creatinine urine ratio -     Vitamin B12 -     Iron,Total/Total Iron Binding Cap   Essential hypertension Continue current regiment -     CBC with Differential/Platelet -     COMPLETE METABOLIC PANEL WITH GFR -     Magnesium -     EKG 12-Lead -     Microalbumin / creatinine urine ratio  Mixed hyperlipidemia Continue current regiment -     Lipid panel  Gastroesophageal reflux disease with esophagitis Doing well on current regiment Pepcid and protonix Monitor for triggers -     Magnesium  Type 2 diabetes mellitus with stage 2 chronic kidney disease, without long-term current use of insulin (HCC) Continue Metformin500mg  BID -     TSH -     Hemoglobin A1c -     HM DIABETES FOOT EXAM -     Microalbumin / creatinine urine ratio  CKD stage 2 due to type 2 diabetes mellitus (HCC) Increase fluids, avoid NSAIDS, monitor sugars, will monitor -     COMPLETE METABOLIC PANEL WITH GFR  Depression, unspecified depression type Doing well on current regiment Continue with benefit  Obstructive sleep apnea syndrome Has an appointment with Neuro, delayed related to COVID19 to January 28, 2019.  Obesity, morbid ([place]) Discussed dietary and exercise modifications -     Hemoglobin A1c -     Insulin, random  Uncomplicated asthma, unspecified asthma severity, unspecified whether persistent Using Breo 100/25 daily, rinse mouth after use Has in date rescue inhaler Ventolin Q4-6PRN Using twice a week with increased pollen Continue singular, discussed maximizing therapy adding zyrtec at night?  Anemia, unspecified  type -     Iron,Total/Total Iron Binding Cap  Vitamin D deficiency Continue supplementation -     VITAMIN D 25 Hydroxy (Vit-D Deficiency)  Screening for blood or protein in urine -     Urinalysis w microscopic + reflex cultur  Insomnia, unspecified type Doing well with medication Has follow up with Neurology related to OSA, CPAP evaluation -     amitriptyline (ELAVIL) 10 MG tablet; Take 1 tablet (10 mg total) by mouth at bedtime as needed.  Encounter for screening for cardiovascular disorders -     EKG 12-Lead  Screening for HIV (human immunodeficiency virus), one time. -   : HIV Antibody (routine testing w rflx)  Medication management -     CBC with Differential/Platelet -     COMPLETE METABOLIC PANEL WITH GFR -     Magnesium -     Lipid panel -     TSH -     Hemoglobin A1c -     Insulin, random -     VITAMIN D 25 Hydroxy (Vit-D Deficiency, Fractures) -     Urinalysis w microscopic + reflex cultur -     HM DIABETES FOOT EXAM -     EKG 12-Lead -     Microalbumin / creatinine urine ratio -     Vitamin B12 -  Iron,Total/Total Iron Binding Cap    Discussed med's effects and SE's. Screening labs and tests as requested with regular follow-up as recommended. Over 40 minutes of exam, counseling, chart review, and complex, high level critical decision making was performed this visit.   HPI  63 y.o. 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  Her blood pressure has been controlled at home, today their BP is BP: 122/80 She does workout. She denies chest pain, shortness  of breath, dizziness.   She is on cholesterol medication and denies myalgias. Her cholesterol is at goal. The cholesterol last visit was:   Lab Results  Component Value Date   CHOL 136 01/12/2019   HDL 58 01/12/2019   LDLCALC 58 01/12/2019   TRIG 115 01/12/2019   CHOLHDL 2.3 01/12/2019    She has been working on diet and exercise for DMII, she is on bASA, she is on ACE/ARB and denies hyperglycemia, hypoglycemia , increased appetite, nausea, polydipsia, polyuria, visual disturbances, vomiting and weight loss. Last A1C in the office was:  Lab Results  Component Value Date   HGBA1C 6.4 (H) 01/12/2019   She is on thyroid medication. Her medication was not changed last visit.  She is taking synthroid 38mcg daily. Lab Results  Component Value Date   TSH 2.88 01/12/2019  .   Last GFR: Lab Results  Component Value Date   GFRNONAA 65 01/12/2019   Lab Results  Component Value Date   GFRAA 75 01/12/2019    Patient is on Vitamin D supplement.   Lab Results  Component Value Date   VD25OH 34 01/12/2019      Current Medications:  Current Outpatient Medications on File Prior to Visit  Medication Sig Dispense Refill  . acetaminophen (TYLENOL) 500 MG tablet Take 1,000 mg by mouth every 6 (six) hours as needed for mild pain, moderate pain or headache.    . albuterol (PROAIR HFA) 108 (90 Base) MCG/ACT inhaler Inhale 2 puffs into the lungs every 6 (six) hours as needed for wheezing. 3 Inhaler 3  . benzonatate (TESSALON PERLES) 100 MG capsule Take 1 capsule (100 mg total) by mouth 3 (three) times daily as needed for cough (Max: 600mg  per day). 60 capsule 0  . buPROPion (WELLBUTRIN XL) 150 MG 24 hr tablet TAKE 1 TABLET BY MOUTH EVERY DAY IN THE MORNING 90 tablet 4  . Cholecalciferol (VITAMIN D3) 10000 units capsule Take 10,000 Units by mouth daily.    . famotidine (PEPCID) 40 MG tablet Take 1 tablet (40 mg total) by mouth every evening. 30 tablet 1  . furosemide (LASIX) 40 MG tablet TAKE 1  TABLET EVERY MORNING FOR BLOOD PRESSURE AND FLUID 90 tablet 1  . HYDROcodone-acetaminophen (NORCO/VICODIN) 5-325 MG tablet 1 tablet every 8 hours as needed for pain 30 tablet 0  . levothyroxine (SYNTHROID) 75 MCG tablet Take 1 tablet (75 mcg total) by mouth daily. 90 tablet 1  . losartan (COZAAR) 100 MG tablet TAKE 1 TABLET BY MOUTH EVERY DAY 90 tablet 1  . metFORMIN (GLUCOPHAGE-XR) 500 MG 24 hr tablet Take 1 to 2 tablets 2 x / day with food for Diabetes (Patient taking  differently: Take 500 mg by mouth 2 (two) times daily. ) 360 tablet 1  . methocarbamol (ROBAXIN) 500 MG tablet Take 1 tablet (500 mg total) by mouth every 8 (eight) hours as needed for muscle spasms. 30 tablet 0  . montelukast (SINGULAIR) 10 MG tablet Take 1 tablet (10 mg total) by mouth daily. 90 tablet 1  . pantoprazole (PROTONIX) 40 MG tablet TAKE 1 TABLET BY MOUTH EVERY DAY 90 tablet 1  . rosuvastatin (CRESTOR) 10 MG tablet TAKE 1 TABLET BY MOUTH EVERY MONDAY, WEDNESDAY, AND FRIDAY 60 tablet 1  . sertraline (ZOLOFT) 100 MG tablet TAKE 1 OR 2 TABLETS BY MOUTH DAILY FOR ANXIETY 180 tablet 1  . traMADol (ULTRAM) 50 MG tablet Take 1/2 to 1 tablet every 4 hours for severe cough (Patient taking differently: as needed. Take 1/2 to 1 tablet every 4 hours for severe cough) 30 tablet 0  . predniSONE (DELTASONE) 20 MG tablet 2 tablets daily for 3 days, 1 tablet daily for 4 days. 10 tablet 0   No current facility-administered medications on file prior to visit.    Allergies:  Allergies  Allergen Reactions  . Levaquin [Levofloxacin In D5w] Other (See Comments)    Ankles hurt really bad ? ACHILLES TENDON ?  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 Health Maintenance:   Immunization History  Administered Date(s) Administered  . Influenza Split 04/26/2013, 05/29/2015  . Influenza Whole 05/26/2012  . Influenza-Unspecified 05/19/2018  . Pneumococcal Polysaccharide-23 09/10/2016  . Td 08/27/2007, 10/22/2017    Tetanus: 2019 Pneumovax: 2018 Prevnar 13: Due at age 89 Flu vaccine: 2019 Zostavax:   Pap: 2011?, Due Dr Perlie Gold MGM: 12/2017, Due for 2020 DEXA: Due at age 88 Colonoscopy: 09/1017  EKG: 01/2018 CXR: 07/2018  Last Dental Exam: Due for 2020 Last Eye Exam:08/2018  Patient Care Team: Unk Pinto, MD as PCP - General (Internal Medicine) Newt Minion, MD as Consulting Physician (Orthopedic Surgery) Love, Alyson Locket, MD as Consulting Physician (Neurology) Ralene Bathe, MD as Consulting Physician (Ophthalmology) Gastroenterology, Sadie Haber as Consulting Physician (Gastroenterology)  Surgical History:  She has a past surgical history that includes Back surgery; Ankle ganglion cyst excision (Left); birth mark treatments; Breast lumpectomy with radioactive seed localization (Left, 02/02/2016); Breast surgery (Right); Colon surgery (2008); Cholecystectomy (N/A, 02/20/2017); and Cataract extraction (Right, 12/2016). Family History:  Herfamily history includes Allergies in her mother; Asthma in her mother; Brain cancer in her father; Breast cancer in her mother; Colon cancer in her paternal grandmother; Emphysema in her mother; Heart disease in her mother; Hypertension in her brother; Prostate cancer in her father. Social History:  She reports that she has never smoked. She has never used smokeless tobacco. She reports that she does not drink alcohol or use drugs.  Review of Systems: Review of Systems  Constitutional: Negative for chills, diaphoresis, fever, malaise/fatigue and  weight loss.  HENT: Negative for congestion, ear discharge, ear pain, hearing loss, nosebleeds, sinus pain, sore throat and tinnitus.   Eyes: Negative for blurred vision, double vision, photophobia, pain, discharge and redness.  Respiratory: Negative for cough, hemoptysis, sputum production, shortness of  breath, wheezing and stridor.   Cardiovascular: Negative for chest pain, palpitations, orthopnea, claudication, leg swelling and PND.  Gastrointestinal: Negative for abdominal pain, blood in stool, constipation, diarrhea, heartburn, melena, nausea and vomiting.  Genitourinary: Negative for dysuria, flank pain, frequency, hematuria and urgency.  Musculoskeletal: Negative for back pain, falls, joint pain, myalgias and neck pain.  Skin: Negative for itching and rash.  Neurological: Negative for dizziness, tingling, tremors, sensory change, speech change, focal weakness, seizures, loss of consciousness, weakness and headaches.  Endo/Heme/Allergies: Negative for environmental allergies and polydipsia. Does not bruise/bleed easily.  Psychiatric/Behavioral: Negative for depression, hallucinations, memory loss, substance abuse and suicidal ideas. The patient is not nervous/anxious and does not have insomnia.     Physical Exam: Estimated body mass index is 39.33 kg/m as calculated from the following:   Height as of this encounter: 5\' 3"  (1.6 m).   Weight as of this encounter: 222 lb (100.7 kg). BP 122/80   Pulse 73   Temp 97.8 F (36.6 C)   Ht 5\' 3"  (1.6 m)   Wt 222 lb (100.7 kg)   SpO2 97%   BMI 39.33 kg/m  General Appearance: Well nourished, in no apparent distress.  Eyes: PERRLA, EOMs, conjunctiva no swelling or erythema, normal fundi and vessels.  Sinuses: No Frontal/maxillary tenderness  ENT/Mouth: Ext aud canals clear, normal light reflex with TMs without erythema, bulging. Good dentition. No erythema, swelling, or exudate on post pharynx. Tonsils not swollen or erythematous. Hearing  normal.  Neck: Supple, thyroid normal. No bruits  Respiratory: Respiratory effort normal, BS equal bilaterally without rales, rhonchi, wheezing or stridor.  Cardio: RRR without murmurs, rubs or gallops. Brisk peripheral pulses without edema.  Chest: symmetric, with normal excursions and percussion.  Abdomen: Soft obese, nontender, no guarding, rebound, hernias, masses, or organomegaly.  Lymphatics: Non tender without lymphadenopathy.  Musculoskeletal: Full ROM all peripheral extremities,5/5 strength, and normal gait.  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  Warm, dry without rashes, lesions, ecchymosis. Neuro: Cranial nerves intact, reflexes equal bilaterally. Normal muscle tone, no cerebellar symptoms. Sensation intact.  Psych: Awake and oriented X 3, normal affect, Insight and Judgment appropriate.   EKG: WNL no ST changes. AORTA SCAN: Deferred   Kristina Webb 1:28 PM [facility]

## 2019-01-12 ENCOUNTER — Encounter: Payer: Self-pay | Admitting: Adult Health Nurse Practitioner

## 2019-01-12 ENCOUNTER — Other Ambulatory Visit: Payer: Self-pay

## 2019-01-12 ENCOUNTER — Ambulatory Visit: Payer: BLUE CROSS/BLUE SHIELD | Admitting: Adult Health Nurse Practitioner

## 2019-01-12 VITALS — BP 122/80 | HR 73 | Temp 97.8°F | Ht 63.0 in | Wt 222.0 lb

## 2019-01-12 DIAGNOSIS — Z1389 Encounter for screening for other disorder: Secondary | ICD-10-CM

## 2019-01-12 DIAGNOSIS — G4733 Obstructive sleep apnea (adult) (pediatric): Secondary | ICD-10-CM

## 2019-01-12 DIAGNOSIS — E559 Vitamin D deficiency, unspecified: Secondary | ICD-10-CM

## 2019-01-12 DIAGNOSIS — N182 Chronic kidney disease, stage 2 (mild): Secondary | ICD-10-CM

## 2019-01-12 DIAGNOSIS — Z136 Encounter for screening for cardiovascular disorders: Secondary | ICD-10-CM | POA: Diagnosis not present

## 2019-01-12 DIAGNOSIS — Z13220 Encounter for screening for lipoid disorders: Secondary | ICD-10-CM

## 2019-01-12 DIAGNOSIS — Z79899 Other long term (current) drug therapy: Secondary | ICD-10-CM

## 2019-01-12 DIAGNOSIS — Z114 Encounter for screening for human immunodeficiency virus [HIV]: Secondary | ICD-10-CM

## 2019-01-12 DIAGNOSIS — F329 Major depressive disorder, single episode, unspecified: Secondary | ICD-10-CM

## 2019-01-12 DIAGNOSIS — Z0000 Encounter for general adult medical examination without abnormal findings: Secondary | ICD-10-CM | POA: Diagnosis not present

## 2019-01-12 DIAGNOSIS — F32A Depression, unspecified: Secondary | ICD-10-CM

## 2019-01-12 DIAGNOSIS — Z130 Encounter for screening for diseases of the blood and blood-forming organs and certain disorders involving the immune mechanism: Secondary | ICD-10-CM

## 2019-01-12 DIAGNOSIS — Z1159 Encounter for screening for other viral diseases: Secondary | ICD-10-CM

## 2019-01-12 DIAGNOSIS — I10 Essential (primary) hypertension: Secondary | ICD-10-CM | POA: Diagnosis not present

## 2019-01-12 DIAGNOSIS — K2100 Gastro-esophageal reflux disease with esophagitis, without bleeding: Secondary | ICD-10-CM

## 2019-01-12 DIAGNOSIS — D649 Anemia, unspecified: Secondary | ICD-10-CM

## 2019-01-12 DIAGNOSIS — Z1329 Encounter for screening for other suspected endocrine disorder: Secondary | ICD-10-CM

## 2019-01-12 DIAGNOSIS — J45909 Unspecified asthma, uncomplicated: Secondary | ICD-10-CM

## 2019-01-12 DIAGNOSIS — E1122 Type 2 diabetes mellitus with diabetic chronic kidney disease: Secondary | ICD-10-CM

## 2019-01-12 DIAGNOSIS — G4700 Insomnia, unspecified: Secondary | ICD-10-CM

## 2019-01-12 DIAGNOSIS — Z131 Encounter for screening for diabetes mellitus: Secondary | ICD-10-CM

## 2019-01-12 DIAGNOSIS — E782 Mixed hyperlipidemia: Secondary | ICD-10-CM

## 2019-01-12 MED ORDER — AMITRIPTYLINE HCL 10 MG PO TABS: 10.0000 mg | ORAL_TABLET | Freq: Every evening | ORAL | Status: DC | PRN

## 2019-01-12 NOTE — Patient Instructions (Addendum)
We will contact you via MyChart in 1-3 days with your lab results  You are due for Dental Exam, Mammogram and Pap once COVID-19 restrictions are lifted.   Take the Breo inhaler 284m then use the 1036msamples provided Continue to use your Ventolin as needed.  If you are using these more than once a day please let usKoreanow. OR if you have new or worsening symptoms.   Allergy Symptoms / Runny Nose:  Pick one from below  Zyrtec / Cetirizine Take 107my mouth May cause drowsiness, take nightly Be sure to drink plenty of water If this is not effective, try Xyzal or Allegra  OR  Xyzal / Levocetirazine  Take 5mg69m mouth May cause drowsiness, take nightly Be sure to drink plenty of water If this is not effective try Allegra or Zyrtec  OR  Allegra / fexofenadine Take 180mg70mmouth daily If this is not effective try Zyrtec or Xyzal   May take with one of these below.  If you battle with allergies you may need to switch these around to find the right combination for you.   Antihistamines:   Singular / Montelukast             OR             Claritin Take 10mg 43my day                        Take 10mg e45m day      Cough:  Mucinex  /  Mucinex DM Take one tablet by mouth every 12 hours with plenty of water while you have symptoms. This is an expectorant and will help to clear the congestion in your lungs.  Delsym Cough Syrup: 20ml [id number] hours as needed for cough.   This is a cough suppressant.   Chest Congestion:  Cool-mist humidifier / vaporizer Or warm steamy shower  Menthol Chest rubs You can get at any pharmacy Use as directed on packaging    Sinus Congestion:  Norel AD or Tylenol Cold and Sinus Take one tablet by mouth every 4-6 hours as needed This will help to dry out your sinuses, nasal decongestant and acetaminophen.  Sudafed (pseudoephedrine) Nasal decongestant Take one tablet every 6 hours as needed Check packaging instructions IF you  take blood pressure medication, this medication can raise your blood pressure. Monitor your blood pressure and if elevation occurs, stop medication. You can get this at any pharmacy, will need your drivers license to purchase this  Flonase: One spray in each nostril daily  This will help to open your nasal passages  Saline Nasal Spray: You can get this at any pharmacy Use as directed on package This will help to sooth inside of your nose from irritation  [facility] Sinus Rinse / Neti Pot Use warm bottled or distilled water DO NOT use tap water! Use twice a day as needed This will help to sooth irritated sinuses and clear nasal congestion If using nasal sprays, do so after completing this.   Work on getting back to your walking, some walking is better than none.  .Coronavirus (COVID-19) Are you at risk?  Are you at risk for the Coronavirus (COVID-19)?  To be considered HIGH RISK for Coronavirus (COVID-19), you have to meet the following criteria:  . Traveled to [place], [place]; and have fever, cough, and shortness of breath within the last 2 weeks of travel OR . Been in close contact with a person diagnosed with COVID-19 within the last 2 weeks and have  . fever, cough,and shortness of breath .  . IF YOU DO NOT MEET THESE CRITERIA, YOU ARE CONSIDERED LOW RISK FOR COVID-19.  What to do if you are HIGH RISK for COVID-19?  Marland Kitchen If you are having a medical emergency, call 911. . Seek medical care right away. Before you go to a doctor's office, urgent care or emergency department, .  call ahead and tell them about your recent travel, contact with someone diagnosed with COVID-19  .  and your symptoms.  . You should receive instructions from your physician's office regarding next steps of care.  . When you arrive at healthcare provider, tell the healthcare staff immediately you  have returned from  . visiting [place], [place], [place], [place] or [place]; or traveled in the [place] to [place], [place],  . [place] or [place] in the last two weeks or you have been in close contact with a person diagnosed with  . COVID-19 in the last 2 weeks.   . Tell the health care staff about your symptoms: fever, cough and shortness of breath. . After you have been seen by a medical provider, you will be either: o Tested for (COVID-19) and discharged home on quarantine except to seek medical care if  o symptoms worsen, and asked to  - Stay home and avoid contact with others until you get your results (4-5 days)  - Avoid travel on public transportation if possible (such as bus, train, or airplane) or o Sent to the Emergency Department by EMS for evaluation, COVID-19 testing  and  o possible admission depending on your condition and test results.  What to do if you are LOW RISK for COVID-19?  Reduce your risk of any infection by using the same precautions used for avoiding the common cold or flu:  Marland Kitchen Wash your hands often with soap and warm water for at least 20 seconds.  If soap and water are not readily available,  . use an alcohol-based hand sanitizer with at least 60% alcohol.  . If coughing or sneezing, cover your mouth and nose by coughing or sneezing into the elbow areas of your shirt or coat, .  into a tissue or into your sleeve (not your hands). . Avoid shaking hands with others and consider head nods or verbal greetings only. . Avoid touching your eyes, nose, or mouth with unwashed hands.  . Avoid close contact with people who are sick. . Avoid places or events with large numbers of people in one location, like concerts or sporting events. . Carefully consider travel plans you have or are making. . If you are planning any travel outside or inside the [place], visit the CDC's Travelers' Health webpage for the latest health notices. . If you have some symptoms but not  all symptoms, continue to monitor at home and seek medical attention  . if your symptoms worsen. . If you are having a medical emergency, call 911. >>>>>>>>>>>>>>>>>>>>>>> Preventive Care for Adults  A healthy lifestyle and preventive care can promote health and wellness. Preventive health guidelines for women include the following key practices.  A routine yearly physical is a good way to check with your health care provider about your health and preventive screening. It is a chance to share any concerns  and updates on your health and to receive a thorough exam.  Visit your dentist for a routine exam and preventive care every 6 months. Brush your teeth twice a day and floss once a day. Good oral hygiene prevents tooth decay and gum disease.  The frequency of eye exams is based on your age, health, family medical history, use of contact lenses, and other factors. Follow your health care provider's recommendations for frequency of eye exams.  Eat a healthy diet. Foods like vegetables, fruits, whole grains, low-fat dairy products, and lean protein foods contain the nutrients you need without too many calories. Decrease your intake of foods high in solid fats, added sugars, and salt. Eat the right amount of calories for you. Get information about a proper diet from your health care provider, if necessary.  Regular physical exercise is one of the most important things you can do for your health. Most adults should get at least 150 minutes of moderate-intensity exercise (any activity that increases your heart rate and causes you to sweat) each week. In addition, most adults need muscle-strengthening exercises on 2 or more days a week.  Maintain a healthy weight. The body mass index (BMI) is a screening tool to identify possible weight problems. It provides an estimate of body fat based on height and weight. Your health care provider can find your BMI and can help you achieve or maintain a healthy  weight. For adults 20 years and older:  A BMI below 18.5 is considered underweight.  A BMI of 18.5 to 24.9 is normal.  A BMI of 25 to 29.9 is considered overweight.  A BMI of 30 and above is considered obese.  Maintain normal blood lipids and cholesterol levels by exercising and minimizing your intake of saturated fat. Eat a balanced diet with plenty of fruit and vegetables. Blood tests for lipids and cholesterol should begin at age 66 and be repeated every 5 years. If your lipid or cholesterol levels are high, you are over 50, or you are at high risk for heart disease, you may need your cholesterol levels checked more frequently. Ongoing high lipid and cholesterol levels should be treated with medicines if diet and exercise are not working.  If you smoke, find out from your health care provider how to quit. If you do not use tobacco, do not start.  Lung cancer screening is recommended for adults aged 72-80 years who are at high risk for developing lung cancer because of a history of smoking. A yearly low-dose CT scan of the lungs is recommended for people who have at least a 30-pack-year history of smoking and are a current smoker or have quit within the past 15 years. A pack year of smoking is smoking an average of 1 pack of cigarettes a day for 1 year (for example: 1 pack a day for 30 years or 2 packs a day for 15 years). Yearly screening should continue until the smoker has stopped smoking for at least 15 years. Yearly screening should be stopped for people who develop a health problem that would prevent them from having lung cancer treatment.  High blood pressure causes heart disease and increases the risk of stroke. Your blood pressure should be checked at least every 1 to 2 years. Ongoing high blood pressure should be treated with medicines if weight loss and exercise do not work.  If you are 46-71 years old, ask your health care provider if you should take aspirin to prevent  strokes.  Diabetes screening involves taking a blood sample to check your fasting blood sugar level. This should be done once every 3 years, after age 36, if you are within normal weight and without risk factors for diabetes. Testing should be considered at a younger age or be carried out more frequently if you are overweight and have at least 1 risk factor for diabetes.  Breast cancer screening is essential preventive care for women. You should practice "breast self-awareness." This means understanding the normal appearance and feel of your breasts and may include breast self-examination. Any changes detected, no matter how small, should be reported to a health care provider. Women in their 55s and 30s should have a clinical breast exam (CBE) by a health care provider as part of a regular health exam every 1 to 3 years. After age 22, women should have a CBE every year. Starting at age 55, women should consider having a mammogram (breast X-ray test) every year. Women who have a family history of breast cancer should talk to their health care provider about genetic screening. Women at a high risk of breast cancer should talk to their health care providers about having an MRI and a mammogram every year.  Breast cancer gene (BRCA)-related cancer risk assessment is recommended for women who have family members with BRCA-related cancers. BRCA-related cancers include breast, ovarian, tubal, and peritoneal cancers. Having family members with these cancers may be associated with an increased risk for harmful changes (mutations) in the breast cancer genes BRCA1 and BRCA2. Results of the assessment will determine the need for genetic counseling and BRCA1 and BRCA2 testing.  Routine pelvic exams to screen for cancer are no longer recommended for nonpregnant women who are considered low risk for cancer of the pelvic organs (ovaries, uterus, and vagina) and who do not have symptoms. Ask your health care provider if a  screening pelvic exam is right for you.  If you have had past treatment for cervical cancer or a condition that could lead to cancer, you need Pap tests and screening for cancer for at least 20 years after your treatment. If Pap tests have been discontinued, your risk factors (such as having a new sexual partner) need to be reassessed to determine if screening should be resumed. Some women have medical problems that increase the chance of getting cervical cancer. In these cases, your health care provider may recommend more frequent screening and Pap tests.  Colorectal cancer can be detected and often prevented. Most routine colorectal cancer screening begins at the age of 36 years and continues through age 72 years. However, your health care provider may recommend screening at an earlier age if you have risk factors for colon cancer. On a yearly basis, your health care provider may provide home test kits to check for hidden blood in the stool. Use of a small camera at the end of a tube, to directly examine the colon (sigmoidoscopy or colonoscopy), can detect the earliest forms of colorectal cancer. Talk to your health care provider about this at age 5, when routine screening begins.  Direct exam of the colon should be repeated every 5-10 years through age 59 years, unless early forms of pre-cancerous polyps or small growths are found.  Hepatitis C blood testing is recommended for all people born from [birth year] through 1965 and any individual with known risks for hepatitis C.  Pra  Osteoporosis is a disease in which the bones lose minerals and strength with aging. This can result in serious  bone fractures or breaks. The risk of osteoporosis can be identified using a bone density scan. Women ages 16 years and over and women at risk for fractures or osteoporosis should discuss screening with their health care providers. Ask your health care provider whether you should take a calcium supplement or vitamin D to  reduce the rate of osteoporosis.  Menopause can be associated with physical symptoms and risks. Hormone replacement therapy is available to decrease symptoms and risks. You should talk to your health care provider about whether hormone replacement therapy is right for you.  Use sunscreen. Apply sunscreen liberally and repeatedly throughout the day. You should seek shade when your shadow is shorter than you. Protect yourself by wearing long sleeves, pants, a wide-brimmed hat, and sunglasses year round, whenever you are outdoors.  Once a month, do a whole body skin exam, using a mirror to look at the skin on your back. Tell your health care provider of new moles, moles that have irregular borders, moles that are larger than a pencil eraser, or moles that have changed in shape or color.  Stay current with required vaccines (immunizations).  Influenza vaccine. All adults should be immunized every year.  Tetanus, diphtheria, and acellular pertussis (Td, Tdap) vaccine. Pregnant women should receive 1 dose of Tdap vaccine during each pregnancy. The dose should be obtained regardless of the length of time since the last dose. Immunization is preferred during the 27th-36th week of gestation. An adult who has not previously received Tdap or who does not know her vaccine status should receive 1 dose of Tdap. This initial dose should be followed by tetanus and diphtheria toxoids (Td) booster doses every 10 years. Adults with an unknown or incomplete history of completing a 3-dose immunization series with Td-containing vaccines should begin or complete a primary immunization series including a Tdap dose. Adults should receive a Td booster every 10 years.  Varicella vaccine. An adult without evidence of immunity to varicella should receive 2 doses or a second dose if she has previously received 1 dose. Pregnant females who do not have evidence of immunity should receive the first dose after pregnancy. This first  dose should be obtained before leaving the health care facility. The second dose should be obtained 4-8 weeks after the first dose.  Human papillomavirus (HPV) vaccine. Females aged 13-26 years who have not received the vaccine previously should obtain the 3-dose series. The vaccine is not recommended for use in pregnant females. However, pregnancy testing is not needed before receiving a dose. If a female is found to be pregnant after receiving a dose, no treatment is needed. In that case, the remaining doses should be delayed until after the pregnancy. Immunization is recommended for any person with an immunocompromised condition through the age of 77 years if she did not get any or all doses earlier. During the 3-dose series, the second dose should be obtained 4-8 weeks after the first dose. The third dose should be obtained 24 weeks after the first dose and 16 weeks after the second dose.  Zoster vaccine. One dose is recommended for adults aged 21 years or older unless certain conditions are present.  Measles, mumps, and rubella (MMR) vaccine. Adults born before [birth year] generally are considered immune to measles and mumps. Adults born in [birth year] or later should have 1 or more doses of MMR vaccine unless there is a contraindication to the vaccine or there is laboratory evidence of immunity to each of the three diseases. A routine  second dose of MMR vaccine should be obtained at least 28 days after the first dose for students attending postsecondary schools, health care workers, or international travelers. People who received inactivated measles vaccine or an unknown type of measles vaccine during 1963-1967 should receive 2 doses of MMR vaccine. People who received inactivated mumps vaccine or an unknown type of mumps vaccine before 1979 and are at high risk for mumps infection should consider immunization with 2 doses of MMR vaccine. For females of childbearing age, rubella immunity should be determined. If there  is no evidence of immunity, females who are not pregnant should be vaccinated. If there is no evidence of immunity, females who are pregnant should delay immunization until after pregnancy. Unvaccinated health care workers born before [birth year] who lack laboratory evidence of measles, mumps, or rubella immunity or laboratory confirmation of disease should consider measles and mumps immunization with 2 doses of MMR vaccine or rubella immunization with 1 dose of MMR vaccine.  Pneumococcal 13-valent conjugate (PCV13) vaccine. When indicated, a person who is uncertain of her immunization history and has no record of immunization should receive the PCV13 vaccine. An adult aged 64 years or older who has certain medical conditions and has not been previously immunized should receive 1 dose of PCV13 vaccine. This PCV13 should be followed with a dose of pneumococcal polysaccharide (PPSV23) vaccine. The PPSV23 vaccine dose should be obtained at least 1 or more year(s) after the dose of PCV13 vaccine. An adult aged 26 years or older who has certain medical conditions and previously received 1 or more doses of PPSV23 vaccine should receive 1 dose of PCV13. The PCV13 vaccine dose should be obtained 1 or more years after the last PPSV23 vaccine dose.    Pneumococcal polysaccharide (PPSV23) vaccine. When PCV13 is also indicated, PCV13 should be obtained first. All adults aged 71 years and older should be immunized. An adult younger than age 79 years who has certain medical conditions should be immunized. Any person who resides in a nursing home or long-term care facility should be immunized. An adult smoker should be immunized. People with an immunocompromised condition and certain other conditions should receive both PCV13 and PPSV23 vaccines. People with human immunodeficiency virus (HIV) infection should be immunized as soon as possible after diagnosis. Immunization during chemotherapy or radiation therapy should be avoided.  Routine use of PPSV23 vaccine is not recommended for American Indians, [place] Natives, or people younger than 65 years unless there are medical conditions that require PPSV23 vaccine. When indicated, people who have unknown immunization and have no record of immunization should receive PPSV23 vaccine. One-time revaccination 5 years after the first dose of PPSV23 is recommended for people aged 19-64 years who have chronic kidney failure, nephrotic syndrome, asplenia, or immunocompromised conditions. People who received 1-2 doses of PPSV23 before age 23 years should receive another dose of PPSV23 vaccine at age 72 years or later if at least 5 years have passed since the previous dose. Doses of PPSV23 are not needed for people immunized with PPSV23 at or after age 57 years.  Preventive Services / Frequency   Ages 10 to 21 years  Blood pressure check.  Lipid and cholesterol check.  Lung cancer screening. / Every year if you are aged 60-80 years and have a 30-pack-year history of smoking and currently smoke or have quit within the past 15 years. Yearly screening is stopped once you have quit smoking for at least 15 years or develop a health problem that would  prevent you from having lung cancer treatment.  Clinical breast exam.** / Every year after age 43 years.   BRCA-related cancer risk assessment.** / For women who have family members with a BRCA-related cancer (breast, ovarian, tubal, or peritoneal cancers).  Mammogram.** / Every year beginning at age 84 years and continuing for as long as you are in good health. Consult with your health care provider.  Pap test.** / Every 3 years starting at age 58 years through age 103 or 67 years with a history of 3 consecutive normal Pap tests.  HPV screening.** / Every 3 years from ages 74 years through ages 58 to 20 years with a history of 3 consecutive normal Pap tests.  Fecal occult blood test (FOBT) of stool. / Every year beginning at age 52 years and  continuing until age 45 years. You may not need to do this test if you get a colonoscopy every 10 years.  Flexible sigmoidoscopy or colonoscopy.** / Every 5 years for a flexible sigmoidoscopy or every 10 years for a colonoscopy beginning at age 7 years and continuing until age 75 years.  Hepatitis C blood test.** / For all people born from [birth year] through 1964 and any individual with known risks for hepatitis C.  Skin self-exam. / Monthly.  Influenza vaccine. / Every year.  Tetanus, diphtheria, and acellular pertussis (Tdap/Td) vaccine.** / Consult your health care provider. Pregnant women should receive 1 dose of Tdap vaccine during each pregnancy. 1 dose of Td every 10 years.  Varicella vaccine.** / Consult your health care provider. Pregnant females who do not have evidence of immunity should receive the first dose after pregnancy.  Zoster vaccine.** / 1 dose for adults aged 90 years or older.  Pneumococcal 13-valent conjugate (PCV13) vaccine.** / Consult your health care provider.  Pneumococcal polysaccharide (PPSV23) vaccine.** / 1 to 2 doses if you smoke cigarettes or if you have certain conditions.  Meningococcal vaccine.** / Consult your health care provider.  Hepatitis A vaccine.** / Consult your health care provider.  Hepatitis B vaccine.** / Consult your health care provider. Screening for abdominal aortic aneurysm (AAA)  by ultrasound is recommended for people over 50 who have history of high blood pressure or who are current or former smokers. ++++++++++++++++++ Recommend Adult Low Dose Aspirin or  coated  Aspirin 81 mg daily  To reduce risk of Colon Cancer 20 %,  Skin Cancer 26 % ,  Melanoma 46%  and  Pancreatic cancer 60% +++++++++++++++++++ Vitamin D goal  is between 70-100.  Please make sure that you are taking your Vitamin D as directed.  It is very important as a natural anti-inflammatory  helping hair, skin, and nails, as well as reducing stroke and heart  attack risk.  It helps your bones and helps with mood. It also decreases numerous cancer risks so please take it as directed.  Low Vit D is associated with a 200-300% higher risk for CANCER  and 200-300% higher risk for HEART   ATTACK  &  STROKE.   .....................................Marland Kitchen It is also associated with higher death rate at younger ages,  autoimmune diseases like Rheumatoid arthritis, Lupus, Multiple Sclerosis.    Also many other serious conditions, like depression, Alzheimer's Dementia, infertility, muscle aches, fatigue, fibromyalgia - just to name a few. ++++++++++++++++++ Recommend the book "The END of DIETING" by Dr Excell Seltzer  & the book "The END of DIABETES " by Dr Excell Seltzer At [email or web address] - get book & Audio CD's  Being diabetic has a  300% increased risk for heart attack, stroke, cancer, and alzheimer- type vascular dementia. It is very important that you work harder with diet by avoiding all foods that are white. Avoid white rice (Devito & wild rice is OK), white potatoes (sweetpotatoes in moderation is OK), White bread or wheat bread or anything made out of white flour like bagels, donuts, rolls, buns, biscuits, cakes, pastries, cookies, pizza crust, and pasta (made from white flour & egg whites) - vegetarian pasta or spinach or wheat pasta is OK. Multigrain breads like Arnold's or Pepperidge Farm, or multigrain sandwich thins or flatbreads.  Diet, exercise and weight loss can reverse and cure diabetes in the early stages.  Diet, exercise and weight loss is very important in the control and prevention of complications of diabetes which affects every system in your body, ie. Brain - dementia/stroke, eyes - glaucoma/blindness, heart - heart attack/heart failure, kidneys - dialysis, stomach - gastric paralysis, intestines - malabsorption, nerves - severe painful neuritis, circulation - gangrene & loss of a leg(s), and finally cancer and Alzheimers.    I recommend avoid fried &  greasy foods,  sweets/candy, white rice (Deegan or wild rice or Quinoa is OK), white potatoes (sweet potatoes are OK) - anything made from white flour - bagels, doughnuts, rolls, buns, biscuits,white and wheat breads, pizza crust and traditional pasta made of white flour & egg white(vegetarian pasta or spinach or wheat pasta is OK).  Multi-grain bread is OK - like multi-grain flat bread or sandwich thins. Avoid alcohol in excess. Exercise is also important.    Eat all the vegetables you want - avoid meat, especially red meat and dairy - especially cheese.  Cheese is the most concentrated form of trans-fats which is the worst thing to clog up our arteries. Veggie cheese is OK which can be found in the fresh produce section at [employer] or [employer] or [employer]  ++++++++++++++++++++++ DASH Eating Plan  DASH stands for "Dietary Approaches to Stop Hypertension."   The DASH eating plan is a healthy eating plan that has been shown to reduce high blood pressure (hypertension). Additional health benefits may include reducing the risk of type 2 diabetes mellitus, heart disease, and stroke. The DASH eating plan may also help with weight loss. WHAT DO I NEED TO KNOW ABOUT THE DASH EATING PLAN? For the DASH eating plan, you will follow these general guidelines:  Choose foods with a percent daily value for sodium of less than 5% (as listed on the food label).  Use salt-free seasonings or herbs instead of table salt or sea salt.  Check with your health care provider or pharmacist before using salt substitutes.  Eat lower-sodium products, often labeled as "lower sodium" or "no salt added."  Eat fresh foods.  Eat more vegetables, fruits, and low-fat dairy products.  Choose whole grains. Look for the word "whole" as the first word in the ingredient list.  Choose fish   Limit sweets, desserts, sugars, and sugary drinks.  Choose heart-healthy fats.  Eat veggie cheese   Eat more home-cooked food  and less restaurant, buffet, and fast food.  Limit fried foods.  Cook foods using methods other than frying.  Limit canned vegetables. If you do use them, rinse them well to decrease the sodium.  When eating at a restaurant, ask that your food be prepared with less salt, or no salt if possible.  WHAT FOODS CAN I EAT? Read Dr Fara Olden Fuhrman's books on The End of Dieting & The End of Diabetes  Grains Whole grain or whole wheat bread. Heffelfinger rice. Whole grain or whole wheat pasta. Quinoa, bulgur, and whole grain cereals. Low-sodium cereals. Corn or whole wheat flour tortillas. Whole grain cornbread. Whole grain crackers. Low-sodium crackers.  Vegetables Fresh or frozen vegetables (raw, steamed, roasted, or grilled). Low-sodium or reduced-sodium tomato and vegetable juices. Low-sodium or reduced-sodium tomato sauce and paste. Low-sodium or reduced-sodium canned vegetables.   Fruits All fresh, canned (in natural juice), or frozen fruits.  Protein Products  All fish and seafood.  Dried beans, peas, or lentils. Unsalted nuts and seeds. Unsalted canned beans.  Dairy Low-fat dairy products, such as skim or 1% milk, 2% or reduced-fat cheeses, low-fat ricotta or cottage cheese, or plain low-fat yogurt. Low-sodium or reduced-sodium cheeses.  Fats and Oils Tub margarines without trans fats. Light or reduced-fat mayonnaise and salad dressings (reduced sodium). Avocado. Safflower, olive, or canola oils. Natural peanut or almond butter.  Other Unsalted popcorn and pretzels. The items listed above may not be a complete list of recommended foods or beverages. Contact your dietitian for more options.  ++++++++++++++++++  WHAT FOODS ARE NOT RECOMMENDED? Grains/ White flour or wheat flour White bread. White pasta. White rice. Refined cornbread. Bagels and croissants. Crackers that contain trans fat.  Vegetables  Creamed or fried vegetables. Vegetables in a . Regular canned  vegetables. Regular canned tomato sauce and paste. Regular tomato and vegetable juices.  Fruits Dried fruits. Canned fruit in light or heavy syrup. Fruit juice.  Meat and Other Protein Products Meat in general - RED meat & White meat.  Fatty cuts of meat. Ribs, chicken wings, all processed meats as bacon, sausage, bologna, salami, fatback, hot dogs, bratwurst and packaged luncheon meats.  Dairy Whole or 2% milk, cream, half-and-half, and cream cheese. Whole-fat or sweetened yogurt. Full-fat cheeses or blue cheese. Non-dairy creamers and whipped toppings. Processed cheese, cheese spreads, or cheese curds.  Condiments Onion and garlic salt, seasoned salt, table salt, and sea salt. Canned and packaged gravies. Worcestershire sauce. Tartar sauce. Barbecue sauce. Teriyaki sauce. Soy sauce, including reduced sodium. Steak sauce. Fish sauce. Oyster sauce. Cocktail sauce. Horseradish. Ketchup and mustard. Meat flavorings and tenderizers. Bouillon cubes. Hot sauce. Tabasco sauce. Marinades. Taco seasonings. Relishes.  Fats and Oils Butter, stick margarine, lard, shortening and bacon fat. Coconut, palm kernel, or palm oils. Regular salad dressings.  Pickles and olives. Salted popcorn and pretzels.  The items listed above may not be a complete list of foods and beverages to avoid.

## 2019-01-13 LAB — CBC WITH DIFFERENTIAL/PLATELET
Absolute Monocytes: 536 cells/uL (ref 200–950)
Basophils Absolute: 46 cells/uL (ref 0–200)
Basophils Relative: 0.4 %
Eosinophils Absolute: 433 {cells}/uL (ref 15–500)
Eosinophils Relative: 3.8 %
HCT: 36.8 % (ref 35.0–45.0)
Hemoglobin: 11.7 g/dL (ref 11.7–15.5)
Lymphs Abs: 3295 cells/uL (ref 850–3900)
MCH: 24.5 pg — ABNORMAL LOW (ref 27.0–33.0)
MCHC: 31.8 g/dL — ABNORMAL LOW (ref 32.0–36.0)
MCV: 77.1 fL — ABNORMAL LOW (ref 80.0–100.0)
MPV: 10.5 fL (ref 7.5–12.5)
Monocytes Relative: 4.7 %
Neutro Abs: 7091 cells/uL (ref 1500–7800)
Neutrophils Relative %: 62.2 %
Platelets: 342 10*3/uL (ref 140–400)
RBC: 4.77 10*6/uL (ref 3.80–5.10)
RDW: 15.5 % — ABNORMAL HIGH (ref 11.0–15.0)
Total Lymphocyte: 28.9 %
WBC: 11.4 10*3/uL — ABNORMAL HIGH (ref 3.8–10.8)

## 2019-01-13 LAB — COMPLETE METABOLIC PANEL WITH GFR
AG Ratio: 1.6 (calc) (ref 1.0–2.5)
ALT: 34 U/L — ABNORMAL HIGH (ref 6–29)
AST: 30 U/L (ref 10–35)
Albumin: 4.5 g/dL (ref 3.6–5.1)
Alkaline phosphatase (APISO): 84 U/L (ref 37–153)
BUN: 20 mg/dL (ref 7–25)
CO2: 27 mmol/L (ref 20–32)
Calcium: 9.6 mg/dL (ref 8.6–10.4)
Chloride: 100 mmol/L (ref 98–110)
Creat: 0.95 mg/dL (ref 0.50–0.99)
GFR, Est African American: 75 mL/min/{1.73_m2} (ref >=60)
GFR, Est Non African American: 65 mL/min/{1.73_m2} (ref >=60)
Globulin: 2.9 g/dL (calc) (ref 1.9–3.7)
Glucose, Bld: 85 mg/dL (ref 65–99)
Potassium: 3.9 mmol/L (ref 3.5–5.3)
Sodium: 139 mmol/L (ref 135–146)
Total Bilirubin: 0.3 mg/dL (ref 0.2–1.2)
Total Protein: 7.4 g/dL (ref 6.1–8.1)

## 2019-01-13 LAB — URINALYSIS W MICROSCOPIC + REFLEX CULTURE
Bacteria, UA: NONE SEEN /HPF
Bilirubin Urine: NEGATIVE
Glucose, UA: NEGATIVE
Hgb urine dipstick: NEGATIVE
Hyaline Cast: NONE SEEN /LPF
Ketones, ur: NEGATIVE
Leukocyte Esterase: NEGATIVE
Nitrites, Initial: NEGATIVE
Protein, ur: NEGATIVE
RBC / HPF: NONE SEEN /HPF (ref 0–2)
Specific Gravity, Urine: 1.013 (ref 1.001–1.03)
WBC, UA: NONE SEEN /HPF (ref 0–5)
pH: 5.5 (ref 5.0–8.0)

## 2019-01-13 LAB — MICROALBUMIN / CREATININE URINE RATIO
Creatinine, Urine: 74 mg/dL (ref 20–275)
Microalb Creat Ratio: 7 mcg/mg creat (ref <30)
Microalb, Ur: 0.5 mg/dL

## 2019-01-13 LAB — IRON, TOTAL/TOTAL IRON BINDING CAP
%SAT: 7 % (calc) — ABNORMAL LOW (ref 16–45)
Iron: 33 ug/dL — ABNORMAL LOW (ref 45–160)
TIBC: 456 mcg/dL (calc) — ABNORMAL HIGH (ref 250–450)

## 2019-01-13 LAB — LIPID PANEL
Cholesterol: 136 mg/dL (ref <200)
HDL: 58 mg/dL (ref >=50)
LDL Cholesterol (Calc): 58 mg/dL (calc)
Non-HDL Cholesterol (Calc): 78 mg/dL (calc) (ref <130)
Total CHOL/HDL Ratio: 2.3 (calc) (ref <5.0)
Triglycerides: 115 mg/dL (ref <150)

## 2019-01-13 LAB — MAGNESIUM: Magnesium: 1.8 mg/dL (ref 1.5–2.5)

## 2019-01-13 LAB — HIV ANTIBODY (ROUTINE TESTING W REFLEX): HIV 1&2 Ab, 4th Generation: NONREACTIVE

## 2019-01-13 LAB — VITAMIN D 25 HYDROXY (VIT D DEFICIENCY, FRACTURES): Vit D, 25-Hydroxy: 34 ng/mL (ref 30–100)

## 2019-01-13 LAB — HEMOGLOBIN A1C
Hgb A1c MFr Bld: 6.4 % of total Hgb — ABNORMAL HIGH (ref <5.7)
Mean Plasma Glucose: 137 (calc)
eAG (mmol/L): 7.6 (calc)

## 2019-01-13 LAB — VITAMIN B12: Vitamin B-12: 1065 pg/mL (ref 200–1100)

## 2019-01-13 LAB — INSULIN, RANDOM: Insulin: 17.9 u[IU]/mL

## 2019-01-13 LAB — NO CULTURE INDICATED

## 2019-01-13 LAB — TSH: TSH: 2.88 mIU/L (ref 0.40–4.50)

## 2019-01-14 ENCOUNTER — Telehealth: Payer: Self-pay | Admitting: Neurology

## 2019-01-14 NOTE — Telephone Encounter (Signed)

## 2019-01-16 ENCOUNTER — Encounter: Payer: Self-pay | Admitting: Adult Health Nurse Practitioner

## 2019-01-27 ENCOUNTER — Other Ambulatory Visit: Payer: Self-pay | Admitting: Adult Health

## 2019-01-28 ENCOUNTER — Institutional Professional Consult (permissible substitution): Payer: Self-pay | Admitting: Neurology

## 2019-02-11 ENCOUNTER — Institutional Professional Consult (permissible substitution): Payer: Self-pay | Admitting: Neurology

## 2019-02-21 ENCOUNTER — Other Ambulatory Visit: Payer: Self-pay | Admitting: Physician Assistant

## 2019-03-17 DIAGNOSIS — Z1231 Encounter for screening mammogram for malignant neoplasm of breast: Secondary | ICD-10-CM | POA: Diagnosis not present

## 2019-03-17 LAB — HM MAMMOGRAPHY

## 2019-03-24 ENCOUNTER — Encounter: Payer: Self-pay | Admitting: *Deleted

## 2019-04-06 DIAGNOSIS — Z130 Encounter for screening for diseases of the blood and blood-forming organs and certain disorders involving the immune mechanism: Secondary | ICD-10-CM | POA: Diagnosis not present

## 2019-04-06 DIAGNOSIS — Z6838 Body mass index (BMI) 38.0-38.9, adult: Secondary | ICD-10-CM | POA: Diagnosis not present

## 2019-04-06 DIAGNOSIS — Z1389 Encounter for screening for other disorder: Secondary | ICD-10-CM | POA: Diagnosis not present

## 2019-04-06 DIAGNOSIS — Z01419 Encounter for gynecological examination (general) (routine) without abnormal findings: Secondary | ICD-10-CM | POA: Diagnosis not present

## 2019-04-12 ENCOUNTER — Institutional Professional Consult (permissible substitution): Payer: Self-pay | Admitting: Neurology

## 2019-04-21 ENCOUNTER — Ambulatory Visit: Payer: BLUE CROSS/BLUE SHIELD | Admitting: Physician Assistant

## 2019-04-26 NOTE — Progress Notes (Signed)
FOLLOW UP  Assessment and Plan:   Hypertension Well controlled with current medications  Monitor blood pressure at home; patient to call if consistently greater than 130/80 Continue DASH diet.   Reminder to go to the ER if any CP, SOB, nausea, dizziness, severe HA, changes vision/speech, left arm numbness and tingling and jaw pain.  Cholesterol At goal Continue low cholesterol diet and exercise.  Check lipid panel.   Diabetes with diabetic chronic kidney disease Continue medication Continue diet and exercise.  Perform daily foot/skin check, notify office of any concerning changes.  Check A1C  Hypothyroidism continue medications the same pending lab results- has had constipation and fatigue reminded to take on an empty stomach 30-72mins before food.  check TSH level  Obesity with co morbidities Long discussion about weight loss, diet, and exercise Recommended diet heavy in fruits and veggies and low in animal meats, cheeses, and dairy products, appropriate calorie intake Patient will work on cutting down on sweets Will follow up in 3 months Follow up about sleep study   Continue diet and meds as discussed. Further disposition pending results of labs. Discussed med's effects and SE's.   Over 30 minutes of exam, counseling, chart review, and critical decision making was performed.   Future Appointments  Date Time Provider [place]  01/25/2020  3:00 PM Garnet Sierras, NP [facility] None    ----------------------------------------------------------------------------------------------------------------------  HPI 63 y.o. female  presents for 3 month follow up on hypertension, cholesterol, diabetes, weight and vitamin D deficiency.  She has been off CPAP, was suppose to see Dr. Brett Fairy in March but COVID hit, sleep study 2016.   Thursday.  BMI is Body mass index is 39.33 kg/m., she has not been working on diet due to recent surgery.  Wt Readings from Last 3  Encounters:  04/27/19 222 lb (100.7 kg)  01/12/19 222 lb (100.7 kg)  08/04/18 220 lb (99.8 kg)   Her blood pressure has been controlled at home, today their BP is BP: 124/80  She does workout. She denies chest pain, shortness of breath, dizziness.   She is on cholesterol medication (rosuvastatin 10 mg three times weekly, new medication) and denies myalgias. Her cholesterol is not at goal. The cholesterol last visit was:   Lab Results  Component Value Date   CHOL 136 01/12/2019   HDL 58 01/12/2019   LDLCALC 58 01/12/2019   TRIG 115 01/12/2019   CHOLHDL 2.3 01/12/2019    She has been working on diet and exercise for T2 diabetes on metformin, and denies foot ulcerations, increased appetite, nausea, paresthesia of the feet, polydipsia, polyuria, visual disturbances, vomiting and weight loss. Last A1C in the office was:  Lab Results  Component Value Date   HGBA1C 6.4 (H) 01/12/2019   She is on thyroid medication, she states she is very tired and constipated. Her medication was not changed last visit.  No biotin.  Lab Results  Component Value Date   TSH 2.88 01/12/2019   Patient is on Vitamin D supplement, taking 10000 IU 1-2 times weekly    Lab Results  Component Value Date   VD25OH 34 01/12/2019       Current Medications:  Current Outpatient Medications on File Prior to Visit  Medication Sig  . acetaminophen (TYLENOL) 500 MG tablet Take 1,000 mg by mouth every 6 (six) hours as needed for mild pain, moderate pain or headache.  . albuterol (PROAIR HFA) 108 (90 Base) MCG/ACT inhaler Inhale 2 puffs into the lungs every 6 (six)  hours as needed for wheezing.  Marland Kitchen amitriptyline (ELAVIL) 10 MG tablet Take 1 tablet (10 mg total) by mouth at bedtime as needed.  . benzonatate (TESSALON PERLES) 100 MG capsule Take 1 capsule (100 mg total) by mouth 3 (three) times daily as needed for cough (Max: 600mg  per day).  Marland Kitchen buPROPion (WELLBUTRIN XL) 150 MG 24 hr tablet TAKE 1 TABLET BY MOUTH EVERY DAY IN  THE MORNING  . Cholecalciferol (VITAMIN D3) 10000 units capsule Take 10,000 Units by mouth daily.  . famotidine (PEPCID) 40 MG tablet Take 1 tablet (40 mg total) by mouth every evening.  . fluticasone furoate-vilanterol (BREO ELLIPTA) 100-25 MCG/INH AEPB Inhale 1 puff into the lungs daily. Rinse mouth after use.  . furosemide (LASIX) 40 MG tablet TAKE 1 TABLET EVERY MORNING FOR BLOOD PRESSURE AND FLUID  . HYDROcodone-acetaminophen (NORCO/VICODIN) 5-325 MG tablet 1 tablet every 8 hours as needed for pain  . levothyroxine (SYNTHROID) 75 MCG tablet Take 1 tablet daily on an empty stomach with only water for 30 minutes & no Antacid meds, Calcium or Magnesium for 4 hours & avoid Biotin  . losartan (COZAAR) 100 MG tablet TAKE 1 TABLET BY MOUTH EVERY DAY  . methocarbamol (ROBAXIN) 500 MG tablet Take 1 tablet (500 mg total) by mouth every 8 (eight) hours as needed for muscle spasms.  . montelukast (SINGULAIR) 10 MG tablet Take 1 tablet Daily for Allergies  . pantoprazole (PROTONIX) 40 MG tablet TAKE 1 TABLET BY MOUTH EVERY DAY  . rosuvastatin (CRESTOR) 10 MG tablet TAKE 1 TABLET BY MOUTH EVERY MONDAY, WEDNESDAY, AND FRIDAY  . sertraline (ZOLOFT) 100 MG tablet TAKE 1 OR 2 TABLETS BY MOUTH DAILY FOR ANXIETY  . metFORMIN (GLUCOPHAGE-XR) 500 MG 24 hr tablet Take 1 to 2 tablets 2 x / day with food for Diabetes (Patient taking differently: Take 500 mg by mouth 2 (two) times daily. )   No current facility-administered medications on file prior to visit.      Allergies:  Allergies  Allergen Reactions  . Levaquin [Levofloxacin In D5w] Other (See Comments)    Ankles hurt really bad ? ACHILLES TENDON ?  Marland Kitchen Doxycycline Other (See Comments)    UNSPECIFIED REACTION      Medical History:  Past Medical History:  Diagnosis Date  . Anemia    hx  . Anxiety   . Arthritis    back  . Asthma   . Depression   . Deviated septum   . GERD (gastroesophageal reflux disease)   . Headache   . History of hiatal  hernia   . Hyperlipemia   . Hypertension   . Hypothyroidism   . OSA (obstructive sleep apnea)    does not use -last 6 months  . Pre-diabetes    per pt   Family history- Reviewed and unchanged Social history- Reviewed and unchanged   Review of Systems:  Review of Systems  Constitutional: Negative for malaise/fatigue and weight loss.  HENT: Negative for hearing loss and tinnitus.   Eyes: Negative for blurred vision and double vision.  Respiratory: Negative for cough, shortness of breath and wheezing.   Cardiovascular: Negative for chest pain, palpitations, orthopnea, claudication and leg swelling.  Gastrointestinal: Negative for abdominal pain, blood in stool, constipation, diarrhea, heartburn, melena, nausea and vomiting.  Genitourinary: Negative.   Musculoskeletal: Negative for joint pain and myalgias.  Skin: Negative for rash.  Neurological: Negative for dizziness, tingling, sensory change, weakness and headaches.  Endo/Heme/Allergies: Negative for polydipsia.  Psychiatric/Behavioral: Negative for  depression and substance abuse. The patient is not nervous/anxious and does not have insomnia.   All other systems reviewed and are negative.     Physical Exam: BP 124/80   Pulse 67   Temp (!) 97.5 F (36.4 C)   Ht 5\' 3"  (1.6 m)   Wt 222 lb (100.7 kg)   SpO2 98%   BMI 39.33 kg/m  Wt Readings from Last 3 Encounters:  04/27/19 222 lb (100.7 kg)  01/12/19 222 lb (100.7 kg)  08/04/18 220 lb (99.8 kg)   General Appearance: Well nourished, in no apparent distress. Eyes: PERRLA, EOMs, conjunctiva no swelling or erythema Sinuses: No Frontal/maxillary tenderness ENT/Mouth: Ext aud canals clear, TMs without erythema, bulging. No erythema, swelling, or exudate on post pharynx.  Tonsils not swollen or erythematous. Hearing normal.  Neck: Supple, thyroid normal.  Respiratory: Respiratory effort normal, BS equal bilaterally without rales, rhonchi, wheezing or stridor.  Cardio: RRR  with no MRGs. Brisk peripheral pulses without edema.  Abdomen: Soft, + BS,  mild epigastric tenderness, no guarding, rebound, hernias, masses. Lymphatics: Non tender without lymphadenopathy.  Musculoskeletal: Full ROM, 5/5 strength, Normal gait,  Skin: Port wine stain right face. Warm, dry without rashes, lesions, ecchymosis.  Neuro: Cranial nerves intact. Normal muscle tone, no cerebellar symptoms. Psych: Awake and oriented X 3, normal affect, Insight and Judgment appropriate   Vicie Mutters, PA-C 3:49 PM [facility]

## 2019-04-27 ENCOUNTER — Ambulatory Visit: Payer: BLUE CROSS/BLUE SHIELD | Admitting: Physician Assistant

## 2019-04-27 ENCOUNTER — Encounter: Payer: Self-pay | Admitting: Physician Assistant

## 2019-04-27 ENCOUNTER — Other Ambulatory Visit: Payer: Self-pay

## 2019-04-27 VITALS — BP 124/80 | HR 67 | Temp 97.5°F | Ht 63.0 in | Wt 222.0 lb

## 2019-04-27 DIAGNOSIS — G4733 Obstructive sleep apnea (adult) (pediatric): Secondary | ICD-10-CM

## 2019-04-27 DIAGNOSIS — I10 Essential (primary) hypertension: Secondary | ICD-10-CM

## 2019-04-27 DIAGNOSIS — E119 Type 2 diabetes mellitus without complications: Secondary | ICD-10-CM

## 2019-04-27 DIAGNOSIS — E1122 Type 2 diabetes mellitus with diabetic chronic kidney disease: Secondary | ICD-10-CM

## 2019-04-27 DIAGNOSIS — E559 Vitamin D deficiency, unspecified: Secondary | ICD-10-CM | POA: Diagnosis not present

## 2019-04-27 DIAGNOSIS — N182 Chronic kidney disease, stage 2 (mild): Secondary | ICD-10-CM

## 2019-04-27 DIAGNOSIS — Q858 Other phakomatoses, not elsewhere classified: Secondary | ICD-10-CM

## 2019-04-27 DIAGNOSIS — F324 Major depressive disorder, single episode, in partial remission: Secondary | ICD-10-CM | POA: Diagnosis not present

## 2019-04-27 DIAGNOSIS — E1169 Type 2 diabetes mellitus with other specified complication: Secondary | ICD-10-CM | POA: Diagnosis not present

## 2019-04-27 DIAGNOSIS — Q8589 Other phakomatoses, not elsewhere classified: Secondary | ICD-10-CM

## 2019-04-27 DIAGNOSIS — E785 Hyperlipidemia, unspecified: Secondary | ICD-10-CM | POA: Diagnosis not present

## 2019-04-27 DIAGNOSIS — Z79899 Other long term (current) drug therapy: Secondary | ICD-10-CM | POA: Diagnosis not present

## 2019-04-27 MED ORDER — VITAMIN D (ERGOCALCIFEROL) 1.25 MG (50000 UNIT) PO CAPS: ORAL_CAPSULE | ORAL | 1 refills | Status: DC

## 2019-04-27 MED ORDER — METFORMIN HCL ER 500 MG PO TB24: 500.0000 mg | ORAL_TABLET | Freq: Two times a day (BID) | ORAL | 2 refills | Status: DC

## 2019-04-27 NOTE — Patient Instructions (Addendum)
We are starting you on Metformin to prevent or treat diabetes.  Metformin does NOT cause low blood sugars.   In order to create energy your cells need insulin and sugar but sometime your cells do not accept the insulin and this can cause increased sugars and decreased energy.   The Metformin helps your cells accept insulin and the sugar this help: 1) increase your energy  2) weight loss.    The two most common side effects are nausea and diarrhea, follow these rules to avoid it but these symptoms get better with time on the medication.    ALSO You can take imodium per box instructions when starting metformin if needed.   Rules of metformin: 1) start out slow with only one pill daily. Our goal for you is 4 pills a day or 2000mg  total.  2) take with your largest meal. 3) Take with least amount of carbs.   Call if you have any problems.      When it comes to diets, agreement about the perfect plan isn't easy to find, even among the experts. Experts at the [place] developed an idea known as the Healthy Eating Plate. Just imagine a plate divided into logical, healthy portions.  The emphasis is on diet quality:  Load up on vegetables and fruits - one-half of your plate: Aim for color and variety, and remember that potatoes don't count.  Go for whole grains - one-quarter of your plate: Whole wheat, barley, wheat berries, quinoa, oats, [place]ino rice, and foods made with them. If you want pasta, go with whole wheat pasta.  Protein power - one-quarter of your plate: Fish, chicken, beans, and nuts are all healthy, versatile protein sources. Limit red meat.  The diet, however, does go beyond the plate, offering a few other suggestions.  Use healthy plant oils, such as olive, canola, soy, corn, sunflower and peanut. Check the labels, and avoid partially hydrogenated oil, which have unhealthy trans fats.  If you're thirsty, drink water. Coffee and tea are good in  moderation, but skip sugary drinks and limit milk and dairy products to one or two daily servings.  The type of carbohydrate in the diet is more important than the amount. Some sources of carbohydrates, such as vegetables, fruits, whole grains, and beans-are healthier than others.  Finally, stay active.

## 2019-04-28 LAB — HEMOGLOBIN A1C
Hgb A1c MFr Bld: 6.9 % of total Hgb — ABNORMAL HIGH (ref <5.7)
Mean Plasma Glucose: 151 (calc)
eAG (mmol/L): 8.4 (calc)

## 2019-04-28 LAB — LIPID PANEL
Cholesterol: 144 mg/dL (ref <200)
HDL: 50 mg/dL (ref >=50)
LDL Cholesterol (Calc): 66 mg/dL (calc)
Non-HDL Cholesterol (Calc): 94 mg/dL (calc) (ref <130)
Total CHOL/HDL Ratio: 2.9 (calc) (ref <5.0)
Triglycerides: 201 mg/dL — ABNORMAL HIGH (ref <150)

## 2019-04-28 LAB — COMPLETE METABOLIC PANEL WITH GFR
AG Ratio: 1.5 (calc) (ref 1.0–2.5)
ALT: 37 U/L — ABNORMAL HIGH (ref 6–29)
AST: 32 U/L (ref 10–35)
Albumin: 4.2 g/dL (ref 3.6–5.1)
Alkaline phosphatase (APISO): 84 U/L (ref 37–153)
BUN: 12 mg/dL (ref 7–25)
CO2: 28 mmol/L (ref 20–32)
Calcium: 9.1 mg/dL (ref 8.6–10.4)
Chloride: 102 mmol/L (ref 98–110)
Creat: 0.84 mg/dL (ref 0.50–0.99)
GFR, Est African American: 87 mL/min/{1.73_m2} (ref >=60)
GFR, Est Non African American: 75 mL/min/{1.73_m2} (ref >=60)
Globulin: 2.8 g/dL (calc) (ref 1.9–3.7)
Glucose, Bld: 125 mg/dL — ABNORMAL HIGH (ref 65–99)
Potassium: 4 mmol/L (ref 3.5–5.3)
Sodium: 139 mmol/L (ref 135–146)
Total Bilirubin: 0.2 mg/dL (ref 0.2–1.2)
Total Protein: 7 g/dL (ref 6.1–8.1)

## 2019-04-28 LAB — VITAMIN D 25 HYDROXY (VIT D DEFICIENCY, FRACTURES): Vit D, 25-Hydroxy: 25 ng/mL — ABNORMAL LOW (ref 30–100)

## 2019-04-28 LAB — CBC WITH DIFFERENTIAL/PLATELET
Absolute Monocytes: 656 cells/uL (ref 200–950)
Basophils Absolute: 48 cells/uL (ref 0–200)
Basophils Relative: 0.5 %
Eosinophils Absolute: 409 cells/uL (ref 15–500)
Eosinophils Relative: 4.3 %
HCT: 37.2 % (ref 35.0–45.0)
Hemoglobin: 11.8 g/dL (ref 11.7–15.5)
Lymphs Abs: 2917 cells/uL (ref 850–3900)
MCH: 25.6 pg — ABNORMAL LOW (ref 27.0–33.0)
MCHC: 31.7 g/dL — ABNORMAL LOW (ref 32.0–36.0)
MCV: 80.7 fL (ref 80.0–100.0)
MPV: 10.2 fL (ref 7.5–12.5)
Monocytes Relative: 6.9 %
Neutro Abs: 5472 cells/uL (ref 1500–7800)
Neutrophils Relative %: 57.6 %
Platelets: 293 10*3/uL (ref 140–400)
RBC: 4.61 10*6/uL (ref 3.80–5.10)
RDW: 15.6 % — ABNORMAL HIGH (ref 11.0–15.0)
Total Lymphocyte: 30.7 %
WBC: 9.5 10*3/uL (ref 3.8–10.8)

## 2019-04-28 LAB — TSH: TSH: 3.16 mIU/L (ref 0.40–4.50)

## 2019-04-28 LAB — MAGNESIUM: Magnesium: 2.1 mg/dL (ref 1.5–2.5)

## 2019-05-03 ENCOUNTER — Other Ambulatory Visit: Payer: Self-pay | Admitting: Internal Medicine

## 2019-05-13 ENCOUNTER — Other Ambulatory Visit: Payer: Self-pay | Admitting: Physician Assistant

## 2019-05-17 ENCOUNTER — Other Ambulatory Visit: Payer: Self-pay | Admitting: Physician Assistant

## 2019-05-17 MED ORDER — TOPIRAMATE 50 MG PO TABS: 50.0000 mg | ORAL_TABLET | Freq: Every day | ORAL | 0 refills | Status: DC

## 2019-06-03 ENCOUNTER — Other Ambulatory Visit: Payer: Self-pay | Admitting: Internal Medicine

## 2019-06-13 ENCOUNTER — Other Ambulatory Visit: Payer: Self-pay | Admitting: Physician Assistant

## 2019-06-15 ENCOUNTER — Other Ambulatory Visit: Payer: Self-pay | Admitting: Adult Health

## 2019-06-23 ENCOUNTER — Ambulatory Visit: Payer: 59 | Admitting: Orthopedic Surgery

## 2019-07-05 ENCOUNTER — Ambulatory Visit: Payer: Self-pay | Admitting: Orthopedic Surgery

## 2019-07-26 DIAGNOSIS — H43811 Vitreous degeneration, right eye: Secondary | ICD-10-CM | POA: Diagnosis not present

## 2019-08-03 DIAGNOSIS — H43811 Vitreous degeneration, right eye: Secondary | ICD-10-CM | POA: Diagnosis not present

## 2019-08-05 DIAGNOSIS — E662 Morbid (severe) obesity with alveolar hypoventilation: Secondary | ICD-10-CM | POA: Insufficient documentation

## 2019-08-05 NOTE — Progress Notes (Signed)
FOLLOW UP  Assessment and Plan:   Hypertension Fairly controlled with current medications;  Monitor blood pressure at home; patient to call if consistently greater than 130/80 Continue DASH diet.   Reminder to go to the ER if any CP, SOB, nausea, dizziness, severe HA, changes vision/speech, left arm numbness and tingling and jaw pain.  Cholesterol At goal; continue statin  Continue low cholesterol diet and exercise.  Check lipid panel.   Diabetes with diabetic chronic kidney disease Continue medication - metformin 500 mg x 4 tabs daily  Continue diet and exercise.  Perform daily foot/skin check, notify office of any concerning changes.  Will order glucometer and supplies today  Diabetic eye exam report requested from Dr. Harlow Mares office Check A1C  CKD 2 associated with T2DM Increase fluids, avoid NSAIDS, monitor sugars, will monitor  Hypothyroidism continue medications the same pending lab results- has had constipation and fatigue reminded to take on an empty stomach 30-59mins before food.  check TSH level  Morbid obesity - BMI 38 with comorbidities (htn, hyperlipidemia, T2DM, OSA) Long discussion about weight loss, diet, and exercise Recommended diet heavy in fruits and veggies and low in animal meats, cheeses, and dairy products, appropriate calorie intake Patient will work on watching portions, restart walking  Short term weight loss goal of <200 lb discussed; she will try wellbutrin and topamax which she still has at home Will follow up in 3 months Follow up about sleep study  Vitamin D deficiency Dose increased from 20000/week to 100000/week  She denies SE; perceives improved focus/energy Check levels today   Cough Benign exam today; ? Mild bronchitis; patient reports improving with OTC cough medication Continue inhalers Continue to monitor; call back if getting worse, can add abx/obtain CXR   Continue diet and meds as discussed. Further disposition pending  results of labs. Discussed med's effects and SE's.   Over 30 minutes of exam, counseling, chart review, and critical decision making was performed.   Future Appointments  Date Time Provider [place]  01/25/2020  3:00 PM Garnet Sierras, NP [facility] None    ----------------------------------------------------------------------------------------------------------------------  HPI 63 y.o. female  presents for 3 month follow up on hypertension, cholesterol, diabetes, weight and vitamin D deficiency.  She has been off CPAP, was suppose to see Dr. Brett Fairy in March but [place] hit, last sleep study 2016, still hasn't followed up due to scheduling issues but plans to follow up.  She has hx of asthma, doing well with daily breo, uses albuterol in AM twice a week if having increased secretions and tightness. She is also on singulair. Has been advised H2i but admits has been forgetting. Does reports some cough in the last 2 weeks, nasal drainage; hx of bronchitis with seasonal changes; she reports taking OTC cough medication and is improving. Denies fever/chills.   BMI is Body mass index is 38.09 kg/m., she has been working on diet, admits exercise is limited, was walking 2 miles most days but has gotten out of the habit since working from home.  Wt Readings from Last 3 Encounters:  08/09/19 215 lb (97.5 kg)  04/27/19 222 lb (100.7 kg)  01/12/19 222 lb (100.7 kg)   She does not check BP due to typically well controlled, reports under increased stress due to husband's health, today their BP is BP: 138/84  She does not workout. She denies chest pain, shortness of breath, dizziness.   She is on cholesterol medication (rosuvastatin 10 mg three times weekly) and denies myalgias. Her cholesterol is  not at goal. The cholesterol last visit was:   Lab Results  Component Value Date   CHOL 144 04/27/2019   HDL 50 04/27/2019   LDLCALC 66 04/27/2019   TRIG 201 (H) 04/27/2019   CHOLHDL 2.9  04/27/2019    She has been working on diet and exercise for T2 diabetes on metformin, and denies foot ulcerations, increased appetite, nausea, paresthesia of the feet, polydipsia, polyuria, visual disturbances, vomiting and weight loss.  She reports doesn't have glucometer; will check occasionally using husband's supplies Last A1C in the office was:  Lab Results  Component Value Date   HGBA1C 6.9 (H) 04/27/2019    She has stable CKD II:  Lab Results  Component Value Date   GFRNONAA 62 04/27/2019   She is on thyroid medication, she states she is very tired and constipated. Her medication was not changed last visit.  No biotin. Taking 75 mcg daily at night (has done so for many years). Lab Results  Component Value Date   TSH 3.16 04/27/2019   Patient is on Vitamin D supplement, taking 50000 IU twice weekly since last visit, endorses improved mental clarity, improved energy  Lab Results  Component Value Date   VD25OH 25 (L) 04/27/2019       Current Medications:  Current Outpatient Medications on File Prior to Visit  Medication Sig  . acetaminophen (TYLENOL) 500 MG tablet Take 1,000 mg by mouth every 6 (six) hours as needed for mild pain, moderate pain or headache.  . albuterol (PROAIR HFA) 108 (90 Base) MCG/ACT inhaler Inhale 2 puffs into the lungs every 6 (six) hours as needed for wheezing.  Marland Kitchen amitriptyline (ELAVIL) 10 MG tablet Take 1 tablet (10 mg total) by mouth at bedtime as needed.  . benzonatate (TESSALON PERLES) 100 MG capsule Take 1 capsule (100 mg total) by mouth 3 (three) times daily as needed for cough (Max: 600mg  per day).  . Cholecalciferol (VITAMIN D3) 10000 units capsule Take 10,000 Units by mouth daily.  . famotidine (PEPCID) 40 MG tablet Take 1 tablet (40 mg total) by mouth every evening.  . fluticasone furoate-vilanterol (BREO ELLIPTA) 100-25 MCG/INH AEPB Inhale 1 puff into the lungs daily. Rinse mouth after use.  . furosemide (LASIX) 40 MG tablet TAKE 1 TABLET  EVERY MORNING FOR BLOOD PRESSURE AND FLUID  . HYDROcodone-acetaminophen (NORCO/VICODIN) 5-325 MG tablet 1 tablet every 8 hours as needed for pain  . levothyroxine (SYNTHROID) 75 MCG tablet Take 1 tablet daily on an empty stomach with only water for 30 minutes & no Antacid meds, Calcium or Magnesium for 4 hours & avoid Biotin  . losartan (COZAAR) 100 MG tablet Take 1 tablet Daily for BP  . metFORMIN (GLUCOPHAGE-XR) 500 MG 24 hr tablet Take 1 tablet (500 mg total) by mouth 2 (two) times daily.  . methocarbamol (ROBAXIN) 500 MG tablet Take 1 tablet (500 mg total) by mouth every 8 (eight) hours as needed for muscle spasms.  . montelukast (SINGULAIR) 10 MG tablet Take 1 tablet Daily for Allergies  . pantoprazole (PROTONIX) 40 MG tablet TAKE 1 TABLET BY MOUTH EVERY DAY  . rosuvastatin (CRESTOR) 10 MG tablet TAKE 1 TABLET BY MOUTH EVERY MONDAY, WEDNESDAY, AND FRIDAY  . sertraline (ZOLOFT) 100 MG tablet TAKE 1 OR 2 TABLETS BY MOUTH DAILY FOR ANXIETY  . Vitamin D, Ergocalciferol, (DRISDOL) 1.25 MG (50000 UT) CAPS capsule 1 pill 2 days a week for vitamin d deficiency  . buPROPion (WELLBUTRIN XL) 150 MG 24 hr tablet TAKE 1  TABLET BY MOUTH EVERY DAY IN THE MORNING (Patient not taking: Reported on 08/09/2019)  . topiramate (TOPAMAX) 50 MG tablet Take 1 tablet at Bedtime for Dieting & Weight Loss (Patient not taking: Reported on 08/09/2019)   No current facility-administered medications on file prior to visit.     Allergies:  Allergies  Allergen Reactions  . Levaquin [Levofloxacin In D5w] Other (See Comments)    Ankles hurt really bad ? ACHILLES TENDON ?  Marland Kitchen Doxycycline Other (See Comments)    UNSPECIFIED REACTION      Medical History:  Past Medical History:  Diagnosis Date  . Anemia    hx  . Anxiety   . Arthritis    back  . Asthma   . Depression   . Deviated septum   . GERD (gastroesophageal reflux disease)   . Headache   . History of hiatal hernia   . Hyperlipemia   . Hypertension   .  Hypothyroidism   . OSA (obstructive sleep apnea)    does not use -last 6 months  . Pre-diabetes    per pt   Family history- Reviewed and unchanged Social history- Reviewed and unchanged   Review of Systems:  Review of Systems  Constitutional: Negative for malaise/fatigue and weight loss.  HENT: Negative for hearing loss and tinnitus.   Eyes: Negative for blurred vision and double vision.  Respiratory: Positive for cough. Negative for sputum production, shortness of breath and wheezing.   Cardiovascular: Negative for chest pain, palpitations, orthopnea, claudication and leg swelling.  Gastrointestinal: Negative for abdominal pain, blood in stool, constipation, diarrhea, heartburn, melena, nausea and vomiting.  Genitourinary: Negative.   Musculoskeletal: Negative for joint pain and myalgias.  Skin: Negative for rash.  Neurological: Negative for dizziness, tingling, sensory change, weakness and headaches.  Endo/Heme/Allergies: Negative for polydipsia.  Psychiatric/Behavioral: Negative for depression and substance abuse. The patient is not nervous/anxious and does not have insomnia.   All other systems reviewed and are negative.    Physical Exam: BP 138/84   Pulse 85   Temp (!) 97.5 F (36.4 C)   Wt 215 lb (97.5 kg)   SpO2 96%   BMI 38.09 kg/m  Wt Readings from Last 3 Encounters:  08/09/19 215 lb (97.5 kg)  04/27/19 222 lb (100.7 kg)  01/12/19 222 lb (100.7 kg)   General Appearance: Well nourished, in no apparent distress. Eyes: PERRLA, EOMs, conjunctiva no swelling or erythema Sinuses: No Frontal/maxillary tenderness ENT/Mouth: Ext aud canals clear, TMs without erythema, bulging. No erythema, swelling, or exudate on post pharynx.  Tonsils not swollen or erythematous. Hearing normal.  Neck: Supple, thyroid normal.  Respiratory: Respiratory effort normal, BS equal bilaterally without rales, rhonchi, wheezing or stridor.  Cardio: RRR with no MRGs. Brisk peripheral pulses  without edema.  Abdomen: Soft, + BS,  mild epigastric tenderness, no guarding, rebound, hernias, masses. Lymphatics: Non tender without lymphadenopathy.  Musculoskeletal: Full ROM, 5/5 strength, Normal gait,  Skin: Port wine stain right face. Warm, dry without rashes, lesions, ecchymosis.  Neuro: Cranial nerves intact. Normal muscle tone, no cerebellar symptoms. Psych: Awake and oriented X 3, normal affect, Insight and Judgment appropriate   Izora Ribas, NP 4:01 PM [facility]

## 2019-08-09 ENCOUNTER — Ambulatory Visit: Payer: BC Managed Care – PPO | Admitting: Adult Health

## 2019-08-09 ENCOUNTER — Other Ambulatory Visit: Payer: Self-pay

## 2019-08-09 ENCOUNTER — Encounter: Payer: Self-pay | Admitting: Adult Health

## 2019-08-09 VITALS — BP 138/84 | HR 85 | Temp 97.5°F | Wt 215.0 lb

## 2019-08-09 DIAGNOSIS — K2100 Gastro-esophageal reflux disease with esophagitis, without bleeding: Secondary | ICD-10-CM

## 2019-08-09 DIAGNOSIS — E559 Vitamin D deficiency, unspecified: Secondary | ICD-10-CM

## 2019-08-09 DIAGNOSIS — F324 Major depressive disorder, single episode, in partial remission: Secondary | ICD-10-CM

## 2019-08-09 DIAGNOSIS — E662 Morbid (severe) obesity with alveolar hypoventilation: Secondary | ICD-10-CM | POA: Diagnosis not present

## 2019-08-09 DIAGNOSIS — E785 Hyperlipidemia, unspecified: Secondary | ICD-10-CM | POA: Diagnosis not present

## 2019-08-09 DIAGNOSIS — I10 Essential (primary) hypertension: Secondary | ICD-10-CM | POA: Diagnosis not present

## 2019-08-09 DIAGNOSIS — E1122 Type 2 diabetes mellitus with diabetic chronic kidney disease: Secondary | ICD-10-CM

## 2019-08-09 DIAGNOSIS — J45909 Unspecified asthma, uncomplicated: Secondary | ICD-10-CM | POA: Diagnosis not present

## 2019-08-09 DIAGNOSIS — E1169 Type 2 diabetes mellitus with other specified complication: Secondary | ICD-10-CM

## 2019-08-09 DIAGNOSIS — N182 Chronic kidney disease, stage 2 (mild): Secondary | ICD-10-CM

## 2019-08-09 MED ORDER — FREESTYLE LITE TEST VI STRP: ORAL_STRIP | 2 refills | Status: DC

## 2019-08-09 MED ORDER — LANCETS MISC: 2 refills | Status: AC

## 2019-08-09 MED ORDER — BLOOD GLUCOSE MONITORING SUPPL DEVI: 0 refills | Status: DC

## 2019-08-09 NOTE — Patient Instructions (Signed)
Goals    . Exercise 150 min/wk Moderate Activity    . Weight (lb) < 200 lb (90.7 kg)          Drink 1/2 your body weight in fluid ounces of water daily; drink a tall glass of water 30 min before meals  Don't eat until you're stuffed- listen to your stomach and eat until you are 80% full   Try eating off of a salad plate; wait 10 min after finishing before going back for seconds  Start by eating the vegetables on your plate; aim for 50% of your meals to be fruits or vegetables  Then eat your protein - lean meats (grass fed if possible), fish, beans, nuts in moderation  Eat your carbs/starch last ONLY if you still are hungry. If you can, stop before finishing it all  Avoid sugar and flour - the closer it looks to it's original form in nature, typically the better it is for you  Splurge in moderation - "assign" days when you get to splurge and have the "bad stuff" - I like to follow a 80% - 20% plan- "good" choices 80 % of the time, "bad" choices in moderation 20% of the time  Simple equation is: Calories out > calories in = weight loss - even if you eat the bad stuff, if you limit portions, you will still lose weight      Exercising to Stay Healthy To become healthy and stay healthy, it is recommended that you do moderate-intensity and vigorous-intensity exercise. You can tell that you are exercising at a moderate intensity if your heart starts beating faster and you start breathing faster but can still hold a conversation. You can tell that you are exercising at a vigorous intensity if you are breathing much harder and faster and cannot hold a conversation while exercising. Exercising regularly is important. It has many health benefits, such as:  Improving overall fitness, flexibility, and endurance.  Increasing bone density.  Helping with weight control.  Decreasing body fat.  Increasing muscle strength.  Reducing stress and tension.  Improving overall health. How  often should I exercise? Choose an activity that you enjoy, and set realistic goals. Your health care provider can help you make an activity plan that works for you. Exercise regularly as told by your health care provider. This may include:  Doing strength training two times a week, such as: ? Lifting weights. ? Using resistance bands. ? Push-ups. ? Sit-ups. ? Yoga.  Doing a certain intensity of exercise for a given amount of time. Choose from these options: ? A total of 150 minutes of moderate-intensity exercise every week. ? A total of 75 minutes of vigorous-intensity exercise every week. ? A mix of moderate-intensity and vigorous-intensity exercise every week. Children, pregnant women, people who have not exercised regularly, people who are overweight, and older adults may need to talk with a health care provider about what activities are safe to do. If you have a medical condition, be sure to talk with your health care provider before you start a new exercise program. What are some exercise ideas? Moderate-intensity exercise ideas include:  Walking 1 mile (1.6 km) in about 15 minutes.  Biking.  Hiking.  Golfing.  Dancing.  Water aerobics. Vigorous-intensity exercise ideas include:  Walking 4.5 miles (7.2 km) or more in about 1 hour.  Jogging or running 5 miles (8 km) in about 1 hour.  Biking 10 miles (16.1 km) or more in about 1 hour.  Lap swimming.  Roller-skating or in-line skating.  Cross-country skiing.  Vigorous competitive sports, such as football, basketball, and soccer.  Jumping rope.  Aerobic dancing. What are some everyday activities that can help me to get exercise?  [place] work, such as: ? Pushing a Conservation officer, nature. ? Raking and bagging leaves.  Washing your car.  Pushing a stroller.  Shoveling snow.  Gardening.  Washing windows or floors. How can I be more active in my day-to-day activities?  Use stairs instead of an elevator.  Take a walk  during your lunch break.  If you drive, park your car farther away from your work or school.  If you take public transportation, get off one stop early and walk the rest of the way.  Stand up or walk around during all of your indoor phone calls.  Get up, stretch, and walk around every 30 minutes throughout the day.  Enjoy exercise with a friend. Support to continue exercising will help you keep a regular routine of activity. What guidelines can I follow while exercising?  Before you start a new exercise program, talk with your health care provider.  Do not exercise so much that you hurt yourself, feel dizzy, or get very short of breath.  Wear comfortable clothes and wear shoes with good support.  Drink plenty of water while you exercise to prevent dehydration or heat stroke.  Work out until your breathing and your heartbeat get faster. Where to find more information  U.S. Department of Health and Human Services: [employer].at  Centers for Disease Control and Prevention (CDC): http://www.wolf.info/ Summary  Exercising regularly is important. It will improve your overall fitness, flexibility, and endurance.  Regular exercise also will improve your overall health. It can help you control your weight, reduce stress, and improve your bone density.  Do not exercise so much that you hurt yourself, feel dizzy, or get very short of breath.  Before you start a new exercise program, talk with your health care provider. This information is not intended to replace advice given to you by your health care provider. Make sure you discuss any questions you have with your health care provider. Document Released: 09/14/2010 Document Revised: 07/25/2017 Document Reviewed: 07/03/2017 Elsevier Patient Education  2020 [employer].

## 2019-08-10 LAB — CBC WITH DIFFERENTIAL/PLATELET
Absolute Monocytes: 678 cells/uL (ref 200–950)
Basophils Absolute: 57 cells/uL (ref 0–200)
Basophils Relative: 0.5 %
Eosinophils Absolute: 429 cells/uL (ref 15–500)
Eosinophils Relative: 3.8 %
HCT: 39.7 % (ref 35.0–45.0)
Hemoglobin: 12.6 g/dL (ref 11.7–15.5)
Lymphs Abs: 3356 cells/uL (ref 850–3900)
MCH: 25.6 pg — ABNORMAL LOW (ref 27.0–33.0)
MCHC: 31.7 g/dL — ABNORMAL LOW (ref 32.0–36.0)
MCV: 80.7 fL (ref 80.0–100.0)
MPV: 9.8 fL (ref 7.5–12.5)
Monocytes Relative: 6 %
Neutro Abs: 6780 cells/uL (ref 1500–7800)
Neutrophils Relative %: 60 %
Platelets: 301 10*3/uL (ref 140–400)
RBC: 4.92 10*6/uL (ref 3.80–5.10)
RDW: 15.5 % — ABNORMAL HIGH (ref 11.0–15.0)
Total Lymphocyte: 29.7 %
WBC: 11.3 10*3/uL — ABNORMAL HIGH (ref 3.8–10.8)

## 2019-08-10 LAB — COMPLETE METABOLIC PANEL WITH GFR
AG Ratio: 1.4 (calc) (ref 1.0–2.5)
ALT: 25 U/L (ref 6–29)
AST: 22 U/L (ref 10–35)
Albumin: 4.1 g/dL (ref 3.6–5.1)
Alkaline phosphatase (APISO): 91 U/L (ref 37–153)
BUN: 22 mg/dL (ref 7–25)
CO2: 29 mmol/L (ref 20–32)
Calcium: 9.7 mg/dL (ref 8.6–10.4)
Chloride: 101 mmol/L (ref 98–110)
Creat: 0.93 mg/dL (ref 0.50–0.99)
GFR, Est African American: 76 mL/min/{1.73_m2} (ref >=60)
GFR, Est Non African American: 66 mL/min/{1.73_m2} (ref >=60)
Globulin: 3 g/dL (calc) (ref 1.9–3.7)
Glucose, Bld: 108 mg/dL — ABNORMAL HIGH (ref 65–99)
Potassium: 4.2 mmol/L (ref 3.5–5.3)
Sodium: 140 mmol/L (ref 135–146)
Total Bilirubin: 0.3 mg/dL (ref 0.2–1.2)
Total Protein: 7.1 g/dL (ref 6.1–8.1)

## 2019-08-10 LAB — LIPID PANEL
Cholesterol: 149 mg/dL (ref <200)
HDL: 52 mg/dL (ref >=50)
LDL Cholesterol (Calc): 70 mg/dL (calc)
Non-HDL Cholesterol (Calc): 97 mg/dL (calc) (ref <130)
Total CHOL/HDL Ratio: 2.9 (calc) (ref <5.0)
Triglycerides: 202 mg/dL — ABNORMAL HIGH (ref <150)

## 2019-08-10 LAB — HEMOGLOBIN A1C
Hgb A1c MFr Bld: 6.6 % of total Hgb — ABNORMAL HIGH (ref <5.7)
Mean Plasma Glucose: 143 (calc)
eAG (mmol/L): 7.9 (calc)

## 2019-08-10 LAB — MAGNESIUM: Magnesium: 2 mg/dL (ref 1.5–2.5)

## 2019-08-10 LAB — TSH: TSH: 2.71 mIU/L (ref 0.40–4.50)

## 2019-08-10 LAB — VITAMIN D 25 HYDROXY (VIT D DEFICIENCY, FRACTURES): Vit D, 25-Hydroxy: 77 ng/mL (ref 30–100)

## 2019-08-13 ENCOUNTER — Other Ambulatory Visit: Payer: Self-pay | Admitting: Physician Assistant

## 2019-08-17 DIAGNOSIS — H43811 Vitreous degeneration, right eye: Secondary | ICD-10-CM | POA: Diagnosis not present

## 2019-08-26 ENCOUNTER — Telehealth: Payer: BC Managed Care – PPO | Admitting: Physician Assistant

## 2019-08-26 ENCOUNTER — Other Ambulatory Visit: Payer: Self-pay

## 2019-08-26 DIAGNOSIS — R059 Cough, unspecified: Secondary | ICD-10-CM

## 2019-08-26 DIAGNOSIS — Z79899 Other long term (current) drug therapy: Secondary | ICD-10-CM

## 2019-08-26 DIAGNOSIS — J45909 Unspecified asthma, uncomplicated: Secondary | ICD-10-CM

## 2019-08-26 DIAGNOSIS — R05 Cough: Secondary | ICD-10-CM | POA: Diagnosis not present

## 2019-08-26 MED ORDER — AZITHROMYCIN 250 MG PO TABS: ORAL_TABLET | ORAL | 1 refills | Status: AC

## 2019-08-26 MED ORDER — FLUTICASONE FUROATE-VILANTEROL 100-25 MCG/INH IN AEPB: 1.0000 | INHALATION_SPRAY | Freq: Every day | RESPIRATORY_TRACT | 2 refills | Status: DC

## 2019-08-26 MED ORDER — DEXAMETHASONE 0.5 MG PO TABS: ORAL_TABLET | ORAL | 0 refills | Status: DC

## 2019-08-26 NOTE — Telephone Encounter (Signed)
THIS ENCOUNTER IS A VIRTUAL VISIT DUE TO COVID-19 - PATIENT WAS NOT SEEN IN THE OFFICE.  PATIENT HAS CONSENTED TO VIRTUAL VISIT / TELEMEDICINE VISIT   Virtual Visit via telephone Note  I connected with Kristina Webb on 08/26/2019 by telephone.  I verified that I am speaking with the correct person using two identifiers.    I discussed the limitations of evaluation and management by telemedicine and the availability of in person appointments. The patient expressed understanding and agreed to proceed.  History of Present Illness: 63 y.o. WF calls with cough x 5 days.  She is on cough med OTC, flonase, mucinex, singulair, started inhaler today, needs refill of breo.  No fever, chills, SOB, CP.   Medications  Current Outpatient Medications (Endocrine & Metabolic):  .  levothyroxine (SYNTHROID) 75 MCG tablet, Take 1 tablet daily on an empty stomach with only water for 30 minutes & no Antacid meds, Calcium or Magnesium for 4 hours & avoid Biotin .  metFORMIN (GLUCOPHAGE-XR) 500 MG 24 hr tablet, Take 1 tablet (500 mg total) by mouth 2 (two) times daily.  Current Outpatient Medications (Cardiovascular):  .  furosemide (LASIX) 40 MG tablet, TAKE 1 TABLET EVERY MORNING FOR BLOOD PRESSURE AND FLUID .  losartan (COZAAR) 100 MG tablet, Take 1 tablet Daily for BP .  rosuvastatin (CRESTOR) 10 MG tablet, TAKE 1 TABLET BY MOUTH EVERY MONDAY, WEDNESDAY, AND FRIDAY  Current Outpatient Medications (Respiratory):  .  albuterol (PROAIR HFA) 108 (90 Base) MCG/ACT inhaler, Inhale 2 puffs into the lungs every 6 (six) hours as needed for wheezing. .  benzonatate (TESSALON PERLES) 100 MG capsule, Take 1 capsule (100 mg total) by mouth 3 (three) times daily as needed for cough (Max: 600mg  per day). .  fluticasone furoate-vilanterol (BREO ELLIPTA) 100-25 MCG/INH AEPB, Inhale 1 puff into the lungs daily. Rinse mouth after use. .  montelukast (SINGULAIR) 10 MG tablet, Take 1 tablet Daily for Allergies  Current  Outpatient Medications (Analgesics):  .  acetaminophen (TYLENOL) 500 MG tablet, Take 1,000 mg by mouth every 6 (six) hours as needed for mild pain, moderate pain or headache. Marland Kitchen  HYDROcodone-acetaminophen (NORCO/VICODIN) 5-325 MG tablet, 1 tablet every 8 hours as needed for pain   Current Outpatient Medications (Other):  .  amitriptyline (ELAVIL) 10 MG tablet, Take 1 tablet (10 mg total) by mouth at bedtime as needed. .  Blood Glucose Monitoring Suppl DEVI, Use to test blood sugar once daily. Dx: E11.22, N18.2 .  famotidine (PEPCID) 40 MG tablet, Take 1 tablet (40 mg total) by mouth every evening. Marland Kitchen  glucose blood (FREESTYLE LITE) test strip, Use to check blood sugar once daily. Dx: E11.22, N18.2 .  Lancets MISC, Use to check blood sugar once daily. Dx: E11.22, N18.2 .  methocarbamol (ROBAXIN) 500 MG tablet, Take 1 tablet (500 mg total) by mouth every 8 (eight) hours as needed for muscle spasms. .  pantoprazole (PROTONIX) 40 MG tablet, TAKE 1 TABLET BY MOUTH EVERY DAY .  sertraline (ZOLOFT) 100 MG tablet, TAKE 1 OR 2 TABLETS BY MOUTH DAILY FOR ANXIETY .  topiramate (TOPAMAX) 50 MG tablet, Take 1 tablet at Bedtime for Dieting & Weight Loss (Patient not taking: Reported on 08/09/2019) .  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   Observations/Objective: General Appearance:Well sounding, in no apparent distress.  ENT/Mouth: No hoarseness, + cough for duration of visit.   Respiratory: completing full sentences without distress, without audible wheeze Neuro: Awake and oriented X 3,  Psych:  Insight and Judgment appropriate.   Assessment and Plan: Cough -     dexamethasone (DECADRON) 0.5 MG tablet; take 1 tablet PO BID for 3 days, then take 1 tablet PO for 4 days. -     azithromycin (ZITHROMAX) 250 MG tablet; Take 2 tablets (500 mg) on  Day 1,  followed by 1 tablet (250 mg) once daily on Days 2 through 5. Currently mild symptoms, will get tested for COVID and will do self isolation at home Suggested symptomatic OTC remedies. Nasal saline spray for congestion. Nasal steroids, allergy pill, staying hydrated Follow up as needed via mychart or text. Please go to the ER or call 911 if symptoms become worse.   Uncomplicated asthma, unspecified asthma severity, unspecified whether persistent -     fluticasone furoate-vilanterol (BREO ELLIPTA) 100-25 MCG/INH AEPB; Inhale 1 puff into the lungs daily. Rinse mouth after use.  Medication management -     fluticasone furoate-vilanterol (BREO ELLIPTA) 100-25 MCG/INH AEPB; Inhale 1 puff into the lungs daily. Rinse mouth after use.     Follow Up Instructions:  I discussed the assessment and treatment plan with the patient. The patient was provided an opportunity to ask questions and all were answered. The patient agreed with the plan and demonstrated an understanding of the instructions.   The patient was advised to call back or seek an in-person evaluation if the symptoms worsen or if the condition fails to improve as anticipated.  I provided 15 minutes of non-face-to-face time during this encounter.   Vicie Mutters, PA-C

## 2019-09-14 ENCOUNTER — Other Ambulatory Visit: Payer: Self-pay | Admitting: Internal Medicine

## 2019-09-14 DIAGNOSIS — H43811 Vitreous degeneration, right eye: Secondary | ICD-10-CM | POA: Diagnosis not present

## 2019-09-23 ENCOUNTER — Other Ambulatory Visit: Payer: Self-pay | Admitting: Physician Assistant

## 2019-09-23 MED ORDER — PROMETHAZINE-DM 6.25-15 MG/5ML PO SYRP: 5.0000 mL | ORAL_SOLUTION | Freq: Four times a day (QID) | ORAL | 1 refills | Status: DC | PRN

## 2019-09-24 IMAGING — CT CT CERVICAL SPINE W/O CM
5 of 7 series · 13 of 33 positions shown, 14 images · non-contrast
Comparison: None.

CT HEAD and cervical spine June 06, 2017

CLINICAL DATA: Syncopal episode in bathroom. History of
hypertension, hyperlipidemia and headache.

EXAM:
CT HEAD WITHOUT CONTRAST
CT CERVICAL SPINE WITHOUT CONTRAST
TECHNIQUE: Multidetector CT imaging of the head and cervical spine was
performed following the standard protocol without intravenous
contrast. Multiplanar CT image reconstructions of the cervical spine
were also generated.

[Series 5: c_spine 2.0 (person_name) (person_name) · axial · 0.27mm/px · z∈[+1076,+1130]mm · 2 of 82 slices shown, 3 images]
[im 28/82  soft-tissue]
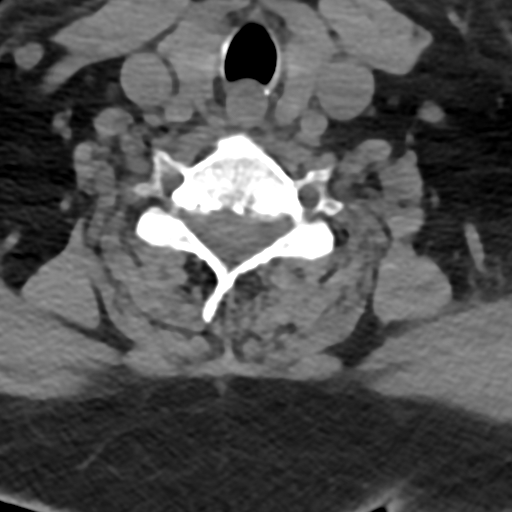
[im 28/82  bone]
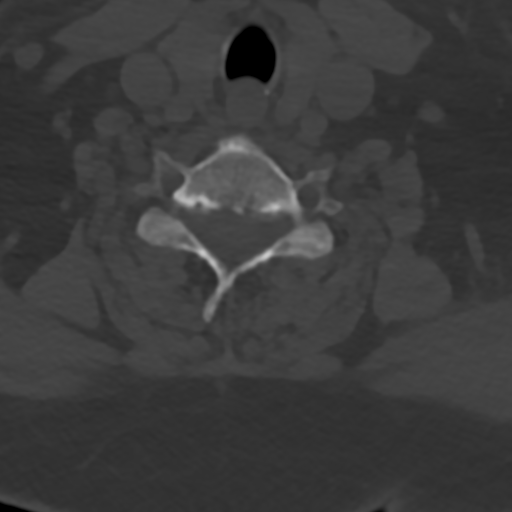
[im 55/82  bone]
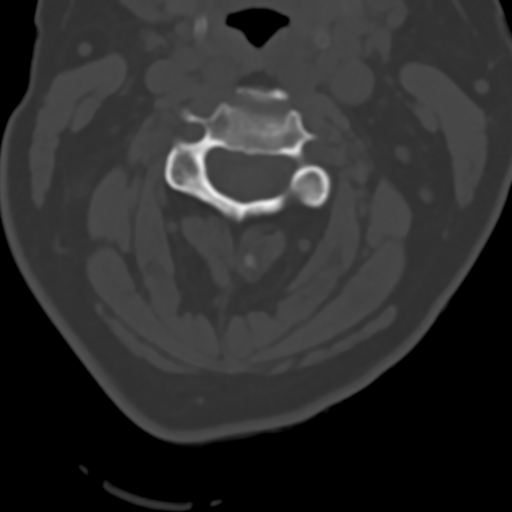

[Series 8: head bone · axial · 0.44mm/px · z∈[+1241,+1295]mm · 2 of 81 slices shown]
[im 27/81  bone]
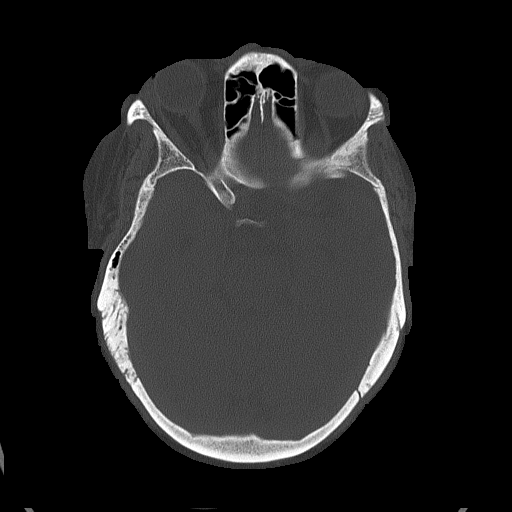
[im 54/81  bone]
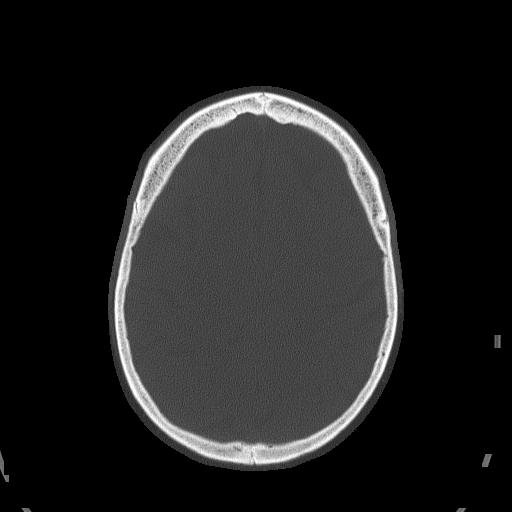

[Series 10: c_spine 2.0 sag bone · sagittal · 0.30mm/px · 4 of 48 slices shown]
[im 10/48  bone]
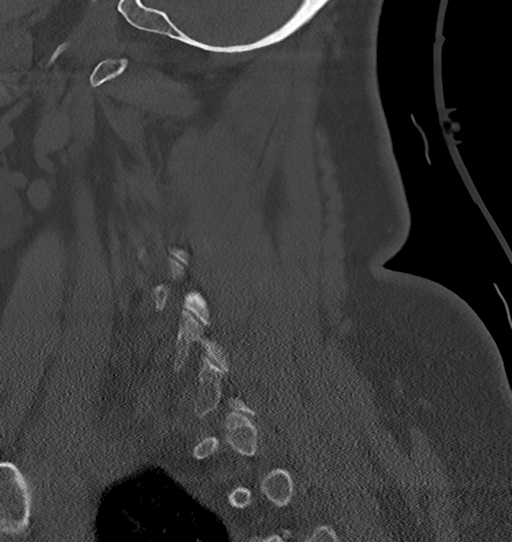
[im 19/48  bone]
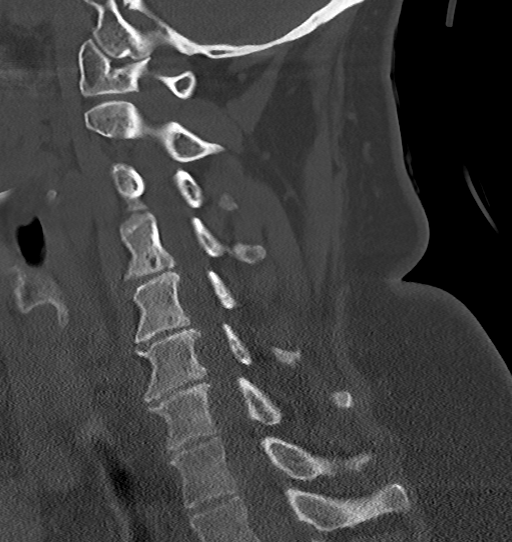
[im 29/48  bone]
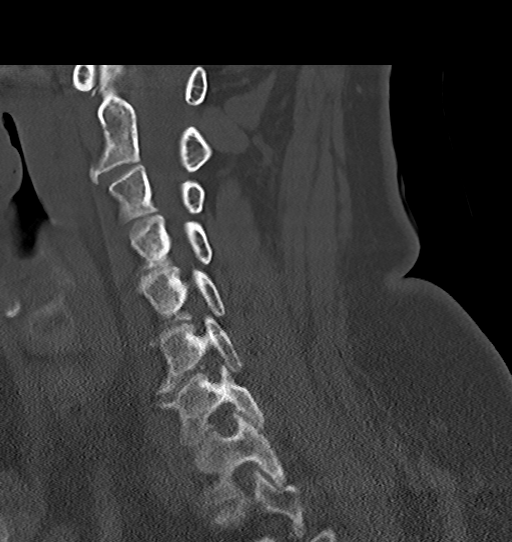
[im 38/48  bone]
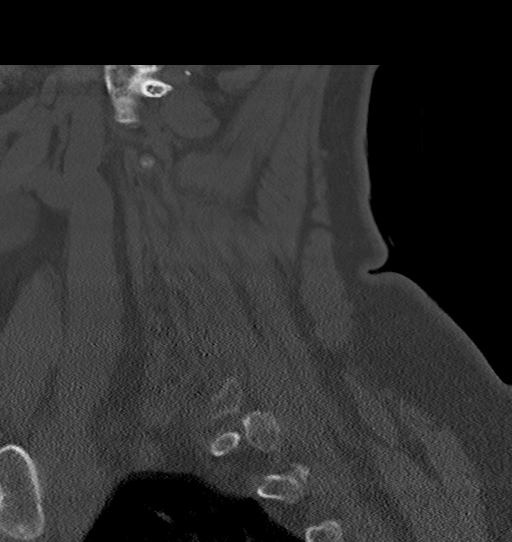

[Series 11: c_spine 2.0 cor bone · coronal · 0.24mm/px · 3 of 48 slices shown]
[im 10/48  bone]
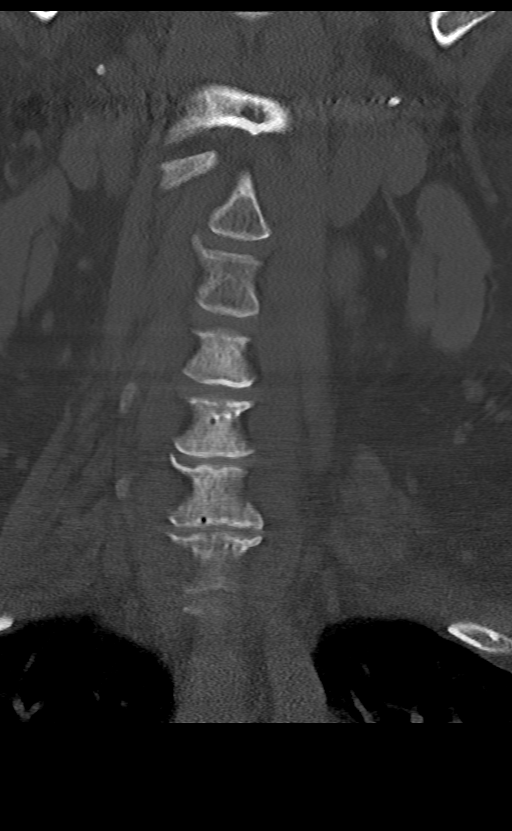
[im 19/48  bone]
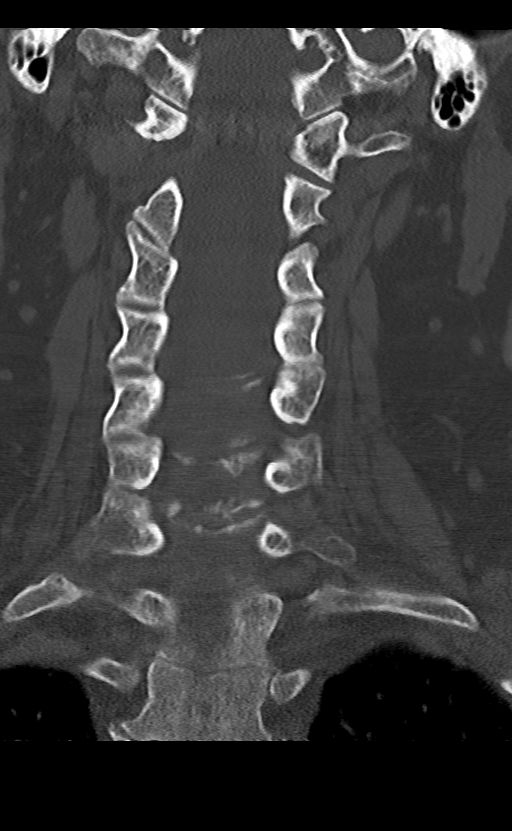
[im 29/48  bone]
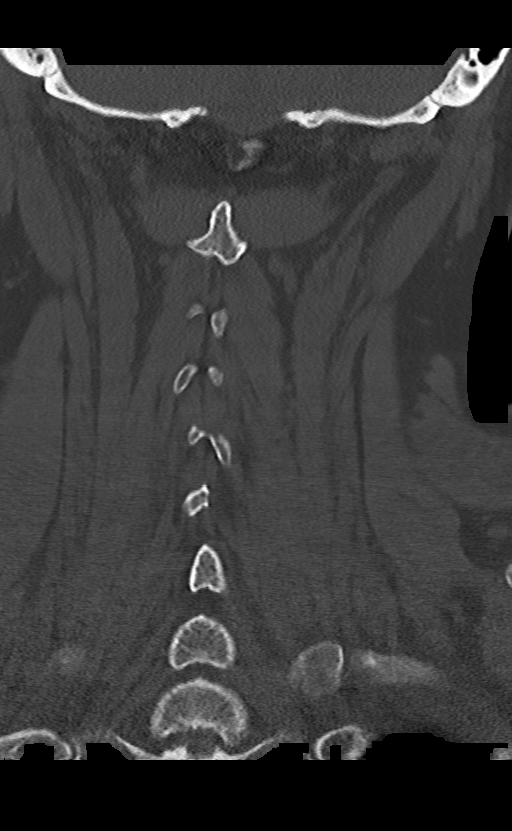

[Series 12: c_spine 2.0 orthogonals · axial · 0.21mm/px · z∈[+1065,+1123]mm · 2 of 86 slices shown]
[im 29/86  bone]
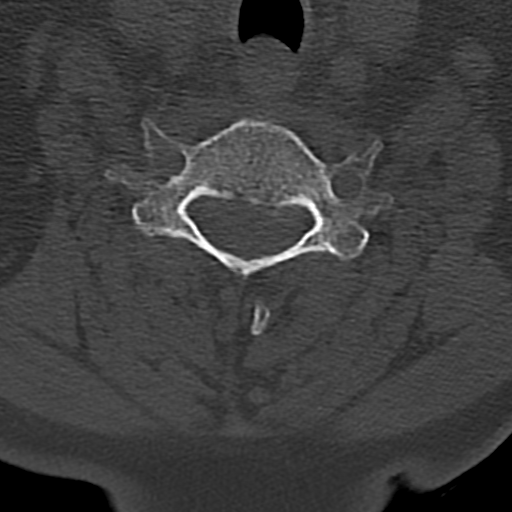
[im 57/86  bone]
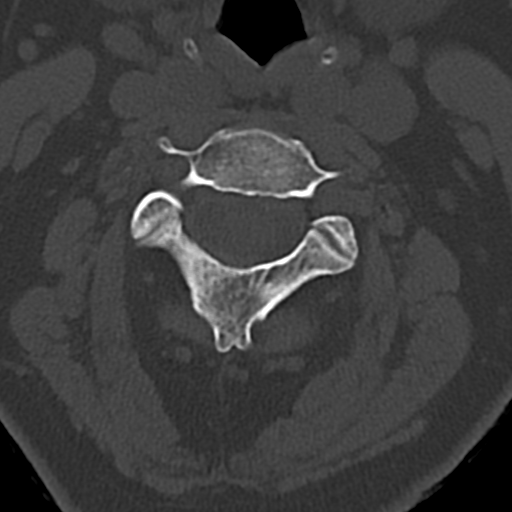

[13 of 33 positions shown; findings below may reference images not displayed]

FINDINGS: CT HEAD FINDINGS

BRAIN: No intraparenchymal hemorrhage, mass effect nor midline
shift. Mild parenchymal brain volume loss. No acute large vascular
territory infarcts. No abnormal extra-axial fluid collections. Basal
cisterns are patent.

VASCULAR: Trace calcific atherosclerosis of the carotid siphons.

SKULL: No skull fracture. No significant scalp soft tissue swelling.

SINUSES/ORBITS: The mastoid air-cells and included paranasal sinuses
are well-aerated.The included ocular globes and orbital contents are
non-suspicious. Status post RIGHT ocular lens implant.

OTHER: Asymmetrically prominent RIGHT facial fat, superimposed RIGHT
periorbital and RIGHT frontal small scalp hematoma. No subcutaneous
gas or radiopaque foreign bodies.

CT CERVICAL SPINE FINDINGS

ALIGNMENT: Straightened lordosis.  Vertebral bodies in alignment.

SKULL BASE AND VERTEBRAE: Cervical vertebral bodies and posterior
elements are intact. Moderate C4-5 through C6-7 disc height loss and
endplate spurring compatible with degenerative discs. No destructive
bony lesions. C1-2 articulation maintained.

SOFT TISSUES AND SPINAL CANAL: Nonacute. Calcified stylohyoid
ligament.

DISC LEVELS: No significant osseous canal stenosis. Moderate
bilateral C4-5, mild RIGHT C6-7 neural foraminal narrowing.

UPPER CHEST: Lung apices are clear.

OTHER: None.
IMPRESSION: CT HEAD:

1. No acute intracranial process. Small RIGHT frontal and
periorbital scalp hematomas. No postseptal hematoma.
2. Stable examination including mild parenchymal brain volume loss.

CT CERVICAL SPINE:

1. No acute fracture or malalignment.
2. Moderate C4-5 and C6-7 neural foraminal narrowing.

## 2019-10-18 DIAGNOSIS — H04123 Dry eye syndrome of bilateral lacrimal glands: Secondary | ICD-10-CM | POA: Diagnosis not present

## 2019-10-18 DIAGNOSIS — H35373 Puckering of macula, bilateral: Secondary | ICD-10-CM | POA: Diagnosis not present

## 2019-10-18 DIAGNOSIS — H5213 Myopia, bilateral: Secondary | ICD-10-CM | POA: Diagnosis not present

## 2019-10-18 LAB — HM DIABETES EYE EXAM

## 2019-10-30 ENCOUNTER — Other Ambulatory Visit: Payer: Self-pay | Admitting: Physician Assistant

## 2019-11-01 ENCOUNTER — Encounter: Payer: Self-pay | Admitting: *Deleted

## 2019-11-10 DIAGNOSIS — E113293 Type 2 diabetes mellitus with mild nonproliferative diabetic retinopathy without macular edema, bilateral: Secondary | ICD-10-CM | POA: Diagnosis not present

## 2019-11-10 DIAGNOSIS — H3581 Retinal edema: Secondary | ICD-10-CM | POA: Diagnosis not present

## 2019-11-10 DIAGNOSIS — H43391 Other vitreous opacities, right eye: Secondary | ICD-10-CM | POA: Diagnosis not present

## 2019-11-10 DIAGNOSIS — H35373 Puckering of macula, bilateral: Secondary | ICD-10-CM | POA: Diagnosis not present

## 2019-11-18 DIAGNOSIS — E113311 Type 2 diabetes mellitus with moderate nonproliferative diabetic retinopathy with macular edema, right eye: Secondary | ICD-10-CM | POA: Diagnosis not present

## 2019-11-18 DIAGNOSIS — H35371 Puckering of macula, right eye: Secondary | ICD-10-CM | POA: Diagnosis not present

## 2019-11-18 DIAGNOSIS — H3581 Retinal edema: Secondary | ICD-10-CM | POA: Diagnosis not present

## 2019-11-19 DIAGNOSIS — H35371 Puckering of macula, right eye: Secondary | ICD-10-CM | POA: Diagnosis not present

## 2019-11-19 DIAGNOSIS — E113291 Type 2 diabetes mellitus with mild nonproliferative diabetic retinopathy without macular edema, right eye: Secondary | ICD-10-CM | POA: Diagnosis not present

## 2019-11-24 ENCOUNTER — Encounter: Payer: BLUE CROSS/BLUE SHIELD | Admitting: Physician Assistant

## 2019-12-01 DIAGNOSIS — E113291 Type 2 diabetes mellitus with mild nonproliferative diabetic retinopathy without macular edema, right eye: Secondary | ICD-10-CM | POA: Diagnosis not present

## 2019-12-01 DIAGNOSIS — H35373 Puckering of macula, bilateral: Secondary | ICD-10-CM | POA: Diagnosis not present

## 2019-12-02 ENCOUNTER — Ambulatory Visit: Payer: BC Managed Care – PPO | Admitting: Physician Assistant

## 2019-12-06 ENCOUNTER — Other Ambulatory Visit: Payer: Self-pay | Admitting: Physician Assistant

## 2019-12-06 ENCOUNTER — Telehealth: Payer: Self-pay | Admitting: Physician Assistant

## 2019-12-06 DIAGNOSIS — G4733 Obstructive sleep apnea (adult) (pediatric): Secondary | ICD-10-CM

## 2019-12-06 DIAGNOSIS — E662 Morbid (severe) obesity with alveolar hypoventilation: Secondary | ICD-10-CM

## 2019-12-06 NOTE — Telephone Encounter (Signed)
-----   Message from Netty Starring sent at 12/06/2019  1:32 PM EDT ----- Regarding: Sleep Consult Appointment Pt has called wanting to reschedule sleep consult. The referral is over a year old. Are you okay putting in a new referral on the pt? Looks like the pt has an upcoming appt with you on 01/12/20. Thank you.

## 2019-12-14 ENCOUNTER — Other Ambulatory Visit: Payer: Self-pay | Admitting: Internal Medicine

## 2019-12-22 DIAGNOSIS — H3581 Retinal edema: Secondary | ICD-10-CM | POA: Diagnosis not present

## 2019-12-22 DIAGNOSIS — E113293 Type 2 diabetes mellitus with mild nonproliferative diabetic retinopathy without macular edema, bilateral: Secondary | ICD-10-CM | POA: Diagnosis not present

## 2019-12-22 DIAGNOSIS — H35373 Puckering of macula, bilateral: Secondary | ICD-10-CM | POA: Diagnosis not present

## 2019-12-23 ENCOUNTER — Ambulatory Visit: Payer: BC Managed Care – PPO | Admitting: Physician Assistant

## 2020-01-03 ENCOUNTER — Telehealth: Payer: Self-pay | Admitting: Neurology

## 2020-01-03 NOTE — Telephone Encounter (Signed)
The pt has called and rescheduled her sleep consult multiple times. The pt does reschedule within the 24 hour period however, the pt was rescheduled three times in 2020 and once so far this year. Per Shirlean Mylar the pt does not need to be rescheduled again due to pt never coming to scheduled appts.

## 2020-01-05 DIAGNOSIS — H35372 Puckering of macula, left eye: Secondary | ICD-10-CM | POA: Diagnosis not present

## 2020-01-05 DIAGNOSIS — E113293 Type 2 diabetes mellitus with mild nonproliferative diabetic retinopathy without macular edema, bilateral: Secondary | ICD-10-CM | POA: Diagnosis not present

## 2020-01-05 DIAGNOSIS — H43812 Vitreous degeneration, left eye: Secondary | ICD-10-CM | POA: Diagnosis not present

## 2020-01-05 DIAGNOSIS — H43392 Other vitreous opacities, left eye: Secondary | ICD-10-CM | POA: Diagnosis not present

## 2020-01-06 ENCOUNTER — Institutional Professional Consult (permissible substitution): Payer: BC Managed Care – PPO | Admitting: Neurology

## 2020-01-12 ENCOUNTER — Ambulatory Visit: Payer: BC Managed Care – PPO | Admitting: Physician Assistant

## 2020-01-12 ENCOUNTER — Encounter: Payer: Self-pay | Admitting: Neurology

## 2020-01-17 NOTE — Telephone Encounter (Signed)
Pt was scheduled for 02/10/20 and has sent a mychart messaging already cancelling for that appointment. Discussed with Dr Brett Fairy and Dr Dohmeier agrees with Shirlean Mylar that patient has cancelled so many times that we should no longer offer to change the appt.

## 2020-01-25 ENCOUNTER — Other Ambulatory Visit: Payer: Self-pay | Admitting: Internal Medicine

## 2020-01-25 ENCOUNTER — Institutional Professional Consult (permissible substitution): Payer: Self-pay | Admitting: Neurology

## 2020-01-25 ENCOUNTER — Ambulatory Visit (INDEPENDENT_AMBULATORY_CARE_PROVIDER_SITE_OTHER): Payer: BC Managed Care – PPO | Admitting: Adult Health Nurse Practitioner

## 2020-01-25 ENCOUNTER — Other Ambulatory Visit: Payer: Self-pay

## 2020-01-25 ENCOUNTER — Encounter: Payer: Self-pay | Admitting: Adult Health Nurse Practitioner

## 2020-01-25 VITALS — BP 116/80 | HR 80 | Temp 97.2°F | Resp 16 | Ht 63.25 in | Wt 221.0 lb

## 2020-01-25 DIAGNOSIS — Z1329 Encounter for screening for other suspected endocrine disorder: Secondary | ICD-10-CM | POA: Diagnosis not present

## 2020-01-25 DIAGNOSIS — Q8589 Other phakomatoses, not elsewhere classified: Secondary | ICD-10-CM

## 2020-01-25 DIAGNOSIS — E559 Vitamin D deficiency, unspecified: Secondary | ICD-10-CM

## 2020-01-25 DIAGNOSIS — K2100 Gastro-esophageal reflux disease with esophagitis, without bleeding: Secondary | ICD-10-CM

## 2020-01-25 DIAGNOSIS — Z79899 Other long term (current) drug therapy: Secondary | ICD-10-CM

## 2020-01-25 DIAGNOSIS — E039 Hypothyroidism, unspecified: Secondary | ICD-10-CM

## 2020-01-25 DIAGNOSIS — D649 Anemia, unspecified: Secondary | ICD-10-CM

## 2020-01-25 DIAGNOSIS — Z0000 Encounter for general adult medical examination without abnormal findings: Secondary | ICD-10-CM

## 2020-01-25 DIAGNOSIS — Z1389 Encounter for screening for other disorder: Secondary | ICD-10-CM | POA: Diagnosis not present

## 2020-01-25 DIAGNOSIS — I10 Essential (primary) hypertension: Secondary | ICD-10-CM | POA: Diagnosis not present

## 2020-01-25 DIAGNOSIS — Z130 Encounter for screening for diseases of the blood and blood-forming organs and certain disorders involving the immune mechanism: Secondary | ICD-10-CM | POA: Diagnosis not present

## 2020-01-25 DIAGNOSIS — Z131 Encounter for screening for diabetes mellitus: Secondary | ICD-10-CM | POA: Diagnosis not present

## 2020-01-25 DIAGNOSIS — Z136 Encounter for screening for cardiovascular disorders: Secondary | ICD-10-CM | POA: Diagnosis not present

## 2020-01-25 DIAGNOSIS — F324 Major depressive disorder, single episode, in partial remission: Secondary | ICD-10-CM

## 2020-01-25 DIAGNOSIS — Z6838 Body mass index (BMI) 38.0-38.9, adult: Secondary | ICD-10-CM

## 2020-01-25 DIAGNOSIS — Z13220 Encounter for screening for lipoid disorders: Secondary | ICD-10-CM | POA: Diagnosis not present

## 2020-01-25 DIAGNOSIS — G4733 Obstructive sleep apnea (adult) (pediatric): Secondary | ICD-10-CM

## 2020-01-25 DIAGNOSIS — E1169 Type 2 diabetes mellitus with other specified complication: Secondary | ICD-10-CM

## 2020-01-25 DIAGNOSIS — N182 Chronic kidney disease, stage 2 (mild): Secondary | ICD-10-CM

## 2020-01-25 DIAGNOSIS — E785 Hyperlipidemia, unspecified: Secondary | ICD-10-CM

## 2020-01-25 DIAGNOSIS — E538 Deficiency of other specified B group vitamins: Secondary | ICD-10-CM

## 2020-01-25 DIAGNOSIS — E662 Morbid (severe) obesity with alveolar hypoventilation: Secondary | ICD-10-CM

## 2020-01-25 DIAGNOSIS — Q858 Other phakomatoses, not elsewhere classified: Secondary | ICD-10-CM

## 2020-01-25 DIAGNOSIS — E1122 Type 2 diabetes mellitus with diabetic chronic kidney disease: Secondary | ICD-10-CM

## 2020-01-25 DIAGNOSIS — J4541 Moderate persistent asthma with (acute) exacerbation: Secondary | ICD-10-CM

## 2020-01-25 MED ORDER — PHENTERMINE HCL 15 MG PO CAPS: 15.0000 mg | ORAL_CAPSULE | ORAL | 0 refills | Status: DC

## 2020-01-25 NOTE — Patient Instructions (Addendum)
    Use the Breo Inhaler every day.  Be sure to rinse your mouth out after use.  This is more of a maintenance inhaler and will help over time.  Use the albuterol if you are short of breath or wheezing.

## 2020-01-25 NOTE — Progress Notes (Addendum)
Complete Physical  Assessment and Plan:  Kristina Webb was seen today for annual exam.  Diagnoses and all orders for this visit:  Encounter for annual physical exam Yearly   Essential hypertension Continue current regiment -     CBC with Differential/Platelet -     COMPLETE METABOLIC PANEL WITH GFR -     Magnesium -     EKG 12-Lead -     Microalbumin / creatinine urine ratio  Mixed hyperlipidemia associated with DMII Continue medications: Discussed dietary and exercise modifications Low fat diet -     Lipid panel  Gastroesophageal reflux disease with esophagitis Doing well on current regiment Pepcid and protonix Monitor for triggers -     Magnesium  Type 2 diabetes mellitus with stage 2 chronic kidney disease, without long-term current use of insulin (HCC) Continue Metformin500mg  BID -     TSH -     Hemoglobin A1c -     HM DIABETES FOOT EXAM -     Microalbumin / creatinine urine ratio  CKD stage 2 due to type 2 diabetes mellitus (HCC) Increase fluids, avoid NSAIDS, monitor sugars, will monitor -     COMPLETE METABOLIC PANEL WITH GFR  Depression, recurrent in partial remission Doing well on current regiment Continue with benefit  Hypothyroidism Taking levothyroxine 75 mcg daily Reminder to take on an empty stomach 30-103mins before first meal of the day. No antacid medications for 4 hours.  Obstructive sleep apnea syndrome Has an appointment with Neuro, delayed related to COVID19 to January 28, 2019.  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  Encounter for screening for cardiovascular disorders -     EKG 12-Lead  Screening for HIV (human immunodeficiency virus), one time. -   : HIV Antibody (routine testing w rflx)  Medication management Continued    Discussed med's effects and SE's. Screening labs and tests as requested with regular follow-up as recommended. Over 40 minutes of face to face exam, counseling, chart review, and complex, high level critical decision making was performed this visit.   HPI  64 y.o. 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  She reports  overall she is doing well.  She reports that she has been having increasing instances of shortness of breath.  She reports this occurs with activity and she ends up having to stop and rest before she can keep going.  She reports she has not used her albuterol inhaler.  She has a maintenace inhaler, Breo, but only uses this as needed. She has been taking topiramate 50mg  daily for appetite suppression for weight loss.  She  reports this has provided some appetite suppression but she has not dropped in weight.  She has increased her activity.  She would like to discussed phentermine with this to increase weight loss.  She has never taken this medication in the past.  Her blood pressure has been controlled at home, today their BP is BP: 116/80 She does workout. She denies chest pain, shortness of breath, dizziness.   She is on cholesterol medication and denies myalgias. Her cholesterol is at goal. The cholesterol last visit was:   Lab Results  Component Value Date   CHOL 149 08/09/2019   HDL 52 08/09/2019   LDLCALC 70 08/09/2019   TRIG 202 (H) 08/09/2019   CHOLHDL 2.9 08/09/2019    She has been working on diet and exercise for DMII, she is on bASA, she is on ACE/ARB and denies hyperglycemia, hypoglycemia , increased appetite, nausea, polydipsia, polyuria, visual disturbances, vomiting and weight loss. Last A1C in the office was:  Lab Results  Component Value Date   HGBA1C 6.6 (H) 08/09/2019   She is on thyroid medication. Her medication was not changed last visit.  She is taking synthroid 58mcg daily. Lab Results  Component Value Date   TSH 2.71 08/09/2019  .   Last GFR: Lab Results  Component Value Date   [facility] 66 08/09/2019   Lab Results  Component Value Date   GFRAA 76 08/09/2019    Patient is on Vitamin D supplement.   Lab Results  Component Value Date   VD25OH 77 08/09/2019      Current Medications:  Current Outpatient Medications on File Prior to Visit  Medication Sig Dispense Refill  . acetaminophen (TYLENOL) 500 MG tablet Take 1,000 mg by mouth every 6 (six) hours as needed for mild pain, moderate pain or headache.    . albuterol (PROAIR HFA) 108 (90 Base) MCG/ACT inhaler Inhale 2 puffs into the lungs every 6 (six) hours as needed for wheezing. 3 Inhaler 3  . amitriptyline (ELAVIL) 10 MG tablet Take 1 tablet (10 mg total) by mouth at bedtime as needed.    . benzonatate (TESSALON)  200 MG capsule TAKE 1 CAPSULE BY MOUTH 3 TIMES A DAY TO PREVENT COUGH 30 capsule 1  . Blood Glucose Monitoring Suppl DEVI Use to test blood sugar once daily. Dx: E11.22, N18.2 1 Device 0  . dexamethasone (DECADRON) 0.5 MG tablet take 1 tablet PO BID for 3 days, then take 1 tablet PO for 4 days. 10 tablet 0  . fluticasone furoate-vilanterol (BREO ELLIPTA) 100-25 MCG/INH AEPB Inhale 1 puff into the lungs daily. Rinse mouth after use. 1 each 2  . furosemide (LASIX) 40 MG tablet TAKE 1 TABLET EVERY MORNING FOR BLOOD PRESSURE AND FLUID 90 tablet 1  . glucose blood (FREESTYLE LITE) test strip Use to check blood sugar once daily. Dx: E11.22, N18.2 100 each 2  . Lancets MISC Use to check blood sugar once daily. Dx: E11.22, N18.2 100 each 2  . levothyroxine (SYNTHROID) 75 MCG tablet Take 1 tablet daily on  an empty stomach with only water for 30 minutes & no Antacid meds, Calcium or Magnesium for 4 hours & avoid Biotin 90 tablet 3  . losartan (COZAAR) 100 MG tablet Take 1 tablet Daily for BP 90 tablet 3  . metFORMIN (GLUCOPHAGE-XR) 500 MG 24 hr tablet Take 1 tablet (500 mg total) by mouth 2 (two) times daily. 180 tablet 2  . methocarbamol (ROBAXIN) 500 MG tablet Take 1 tablet (500 mg total) by mouth every 8 (eight) hours as needed for muscle spasms. 30 tablet 0  . montelukast (SINGULAIR) 10 MG tablet Take 1 tablet Daily for Allergies 90 tablet 3  . promethazine-dextromethorphan (PROMETHAZINE-DM) 6.25-15 MG/5ML syrup Take 5 mLs by mouth 4 (four) times daily as needed for cough. 240 mL 1  . rosuvastatin (CRESTOR) 10 MG tablet TAKE 1 TABLET BY MOUTH EVERY MONDAY, WEDNESDAY, AND FRIDAY 36 tablet 3  . sertraline (ZOLOFT) 100 MG tablet TAKE 1 OR 2 TABLETS BY MOUTH DAILY FOR ANXIETY 180 tablet 1  . topiramate (TOPAMAX) 50 MG tablet TAKE 1 TABLET AT BEDTIME FOR DIETING & WEIGHT LOSS 90 tablet 1  . Vitamin D, Ergocalciferol, (DRISDOL) 1.25 MG (50000 UNIT) CAPS capsule TAKE 1 CAPSULE BY MOUTH 2 DAYS A WEEK 24 capsule 2    No current facility-administered medications on file prior to visit.   Allergies:  Allergies  Allergen Reactions  . Levaquin [Levofloxacin In D5w] Other (See Comments)    Ankles hurt really bad ? ACHILLES TENDON ?  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 Health Maintenance:   Immunization History  Administered Date(s) Administered  . Influenza Split 04/26/2013, 05/29/2015  . Influenza Whole 05/26/2012  . Influenza-Unspecified 05/19/2018, 05/21/2019  . Pneumococcal Polysaccharide-23 09/10/2016  . Td 08/27/2007, 10/22/2017    Tetanus: 2019 Pneumovax: 2018 Prevnar 33: Due at age 85 Flu vaccine: 2019 Zostavax: Had shingles at age 63, discussed vaccination with patient.   [place] x2- Complete 11/26/19 Pap: 02/2020?, Due Dr Perlie Gold MGM: 12/2017, Scheduled for 2021 DEXA: Due at age 41 Colonoscopy: 09/1017  EKG: 01/2020 - NSR CXR: 07/2018  Last Dental Exam: Due for 2020 Last Eye Exam:08/2018  Patient Care Team: Unk Pinto, MD as PCP - General (Internal Medicine) Newt Minion, MD as Consulting Physician (Orthopedic Surgery) Love, Alyson Locket, MD as Consulting Physician (Neurology) Ralene Bathe, MD as Consulting Physician (Ophthalmology) Gastroenterology, Sadie Haber as Consulting Physician (Gastroenterology)  Surgical History:  She has a past surgical history that includes Back surgery; Ankle ganglion cyst excision (Left); birth mark treatments; Breast lumpectomy with radioactive seed  localization (Left, 02/02/2016); Breast surgery (Right); Colon surgery (2008); Cholecystectomy (N/A, 02/20/2017); and Cataract extraction (Right, 12/2016). Family History:  Herfamily history includes Allergies in her mother; Asthma in her mother; Brain cancer in her father; Breast cancer in her mother; Colon cancer in her paternal grandmother; Emphysema in her mother; Heart disease in her mother; Hypertension in her brother; Prostate cancer in her father. Social History:  She reports that she has never smoked. She has never used smokeless tobacco. She reports that she does not drink alcohol or use drugs.  Review of Systems: Review of Systems  Constitutional: Negative for chills, diaphoresis, fever, malaise/fatigue and weight loss.  HENT: Negative for congestion, ear discharge, ear pain, hearing loss, nosebleeds, sinus pain, sore throat and tinnitus.   Eyes: Negative for blurred vision, double vision, photophobia, pain, discharge and redness.  Respiratory: Negative for cough, hemoptysis, sputum production, shortness of breath, wheezing and stridor.   Cardiovascular: Negative for chest pain, palpitations, orthopnea, claudication, leg swelling and PND.  Gastrointestinal: Negative for abdominal pain, blood in stool, constipation, diarrhea, heartburn, melena, nausea and vomiting.  Genitourinary: Negative for dysuria, flank pain, frequency, hematuria and urgency.  Musculoskeletal: Negative for back pain, falls, joint pain, myalgias and neck pain.  Skin: Negative for itching and rash.  Neurological: Negative for dizziness, tingling, tremors, sensory change, speech change, focal weakness, seizures, loss of consciousness, weakness and headaches.  Endo/Heme/Allergies: Negative for environmental allergies and polydipsia. Does not bruise/bleed easily.  Psychiatric/Behavioral: Negative for depression, hallucinations, memory loss, substance abuse and suicidal ideas. The patient is not nervous/anxious and does not  have insomnia.     Physical Exam: Estimated body mass index is 38.84 kg/m as calculated from the following:   Height as of this encounter: 5' 3.25" (1.607 m).   Weight as of this encounter: 221 lb (100.2 kg). BP 116/80   Pulse 80   Temp (!) 97.2 F (36.2 C)   Resp 16   Ht 5' 3.25" (1.607 m)   Wt 221 lb (100.2 kg)   SpO2 99%   BMI 38.84 kg/m  General Appearance: Well nourished, in no apparent distress.  Eyes: PERRLA, EOMs, conjunctiva no swelling or erythema, normal fundi and vessels.  Sinuses: No Frontal/maxillary tenderness  ENT/Mouth: Ext aud canals clear, normal light reflex with TMs without erythema, bulging. Good dentition. No erythema, swelling, or exudate on post pharynx. Tonsils not swollen or erythematous. Hearing normal.  Neck: Supple, thyroid normal. No bruits  Respiratory: Respiratory effort normal, BS equal bilaterally without rales, rhonchi, wheezing or stridor.  Cardio: RRR without murmurs, rubs or gallops. Brisk peripheral pulses without edema.  Chest: symmetric, with normal excursions and percussion.  Abdomen: Soft obese, nontender, no guarding, rebound, hernias, masses, or organomegaly.  Lymphatics: Non tender without lymphadenopathy.  Musculoskeletal: Full ROM all peripheral extremities,5/5 strength, and normal gait.  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  Warm, dry without rashes, lesions, ecchymosis. Neuro: Cranial nerves intact, reflexes equal bilaterally. Normal muscle tone, no cerebellar symptoms. Sensation intact.  Psych: Awake and oriented X 3, normal affect, Insight and Judgment appropriate.   EKG: WNL no ST changes. AORTA SCAN: Deferred   Ehren Berisha 3:50 PM [facility]

## 2020-01-26 LAB — IRON, TOTAL/TOTAL IRON BINDING CAP
%SAT: 11 % (calc) — ABNORMAL LOW (ref 16–45)
Iron: 52 ug/dL (ref 45–160)
TIBC: 467 mcg/dL (calc) — ABNORMAL HIGH (ref 250–450)

## 2020-01-26 LAB — VITAMIN D 25 HYDROXY (VIT D DEFICIENCY, FRACTURES): Vit D, 25-Hydroxy: 74 ng/mL (ref 30–100)

## 2020-01-26 LAB — CBC WITH DIFFERENTIAL/PLATELET
Absolute Monocytes: 545 cells/uL (ref 200–950)
Basophils Absolute: 56 cells/uL (ref 0–200)
Basophils Relative: 0.6 %
Eosinophils Absolute: 432 cells/uL (ref 15–500)
Eosinophils Relative: 4.6 %
HCT: 38.6 % (ref 35.0–45.0)
Hemoglobin: 12.3 g/dL (ref 11.7–15.5)
Lymphs Abs: 2754 cells/uL (ref 850–3900)
MCH: 26.2 pg — ABNORMAL LOW (ref 27.0–33.0)
MCHC: 31.9 g/dL — ABNORMAL LOW (ref 32.0–36.0)
MCV: 82.3 fL (ref 80.0–100.0)
MPV: 9.8 fL (ref 7.5–12.5)
Monocytes Relative: 5.8 %
Neutro Abs: 5612 cells/uL (ref 1500–7800)
Neutrophils Relative %: 59.7 %
Platelets: 309 10*3/uL (ref 140–400)
RBC: 4.69 10*6/uL (ref 3.80–5.10)
RDW: 15 % (ref 11.0–15.0)
Total Lymphocyte: 29.3 %
WBC: 9.4 10*3/uL (ref 3.8–10.8)

## 2020-01-26 LAB — COMPLETE METABOLIC PANEL WITH GFR
AG Ratio: 1.5 (calc) (ref 1.0–2.5)
ALT: 26 U/L (ref 6–29)
AST: 25 U/L (ref 10–35)
Albumin: 4.5 g/dL (ref 3.6–5.1)
Alkaline phosphatase (APISO): 79 U/L (ref 37–153)
BUN: 18 mg/dL (ref 7–25)
CO2: 28 mmol/L (ref 20–32)
Calcium: 9.5 mg/dL (ref 8.6–10.4)
Chloride: 100 mmol/L (ref 98–110)
Creat: 0.95 mg/dL (ref 0.50–0.99)
GFR, Est African American: 74 mL/min/{1.73_m2} (ref >=60)
GFR, Est Non African American: 64 mL/min/{1.73_m2} (ref >=60)
Globulin: 3 g/dL (calc) (ref 1.9–3.7)
Glucose, Bld: 116 mg/dL — ABNORMAL HIGH (ref 65–99)
Potassium: 4 mmol/L (ref 3.5–5.3)
Sodium: 139 mmol/L (ref 135–146)
Total Bilirubin: 0.5 mg/dL (ref 0.2–1.2)
Total Protein: 7.5 g/dL (ref 6.1–8.1)

## 2020-01-26 LAB — URINALYSIS W MICROSCOPIC + REFLEX CULTURE
Bacteria, UA: NONE SEEN /HPF
Bilirubin Urine: NEGATIVE
Glucose, UA: NEGATIVE
Hgb urine dipstick: NEGATIVE
Hyaline Cast: NONE SEEN /LPF
Ketones, ur: NEGATIVE
Leukocyte Esterase: NEGATIVE
Nitrites, Initial: NEGATIVE
Protein, ur: NEGATIVE
RBC / HPF: NONE SEEN /HPF (ref 0–2)
Specific Gravity, Urine: 1.009 (ref 1.001–1.03)
Squamous Epithelial / HPF: NONE SEEN /HPF (ref <=5)
WBC, UA: NONE SEEN /HPF (ref 0–5)
pH: 7 (ref 5.0–8.0)

## 2020-01-26 LAB — MICROALBUMIN / CREATININE URINE RATIO
Creatinine, Urine: 41 mg/dL (ref 20–275)
Microalb Creat Ratio: 15 mcg/mg creat (ref <30)
Microalb, Ur: 0.6 mg/dL

## 2020-01-26 LAB — NO CULTURE INDICATED

## 2020-01-26 LAB — LIPID PANEL
Cholesterol: 143 mg/dL (ref <200)
HDL: 57 mg/dL (ref >=50)
LDL Cholesterol (Calc): 67 mg/dL (calc)
Non-HDL Cholesterol (Calc): 86 mg/dL (calc) (ref <130)
Total CHOL/HDL Ratio: 2.5 (calc) (ref <5.0)
Triglycerides: 106 mg/dL (ref <150)

## 2020-01-26 LAB — HEMOGLOBIN A1C
Hgb A1c MFr Bld: 6.4 % of total Hgb — ABNORMAL HIGH (ref <5.7)
Mean Plasma Glucose: 137 (calc)
eAG (mmol/L): 7.6 (calc)

## 2020-01-26 LAB — VITAMIN B12: Vitamin B-12: 518 pg/mL (ref 200–1100)

## 2020-01-26 LAB — TSH: TSH: 3.79 mIU/L (ref 0.40–4.50)

## 2020-02-03 ENCOUNTER — Other Ambulatory Visit: Payer: Self-pay | Admitting: Physician Assistant

## 2020-02-07 DIAGNOSIS — Z1389 Encounter for screening for other disorder: Secondary | ICD-10-CM | POA: Diagnosis not present

## 2020-02-07 DIAGNOSIS — Z01419 Encounter for gynecological examination (general) (routine) without abnormal findings: Secondary | ICD-10-CM | POA: Diagnosis not present

## 2020-02-07 DIAGNOSIS — Z6838 Body mass index (BMI) 38.0-38.9, adult: Secondary | ICD-10-CM | POA: Diagnosis not present

## 2020-02-07 DIAGNOSIS — Z130 Encounter for screening for diseases of the blood and blood-forming organs and certain disorders involving the immune mechanism: Secondary | ICD-10-CM | POA: Diagnosis not present

## 2020-02-10 ENCOUNTER — Institutional Professional Consult (permissible substitution): Payer: Self-pay | Admitting: Neurology

## 2020-02-11 ENCOUNTER — Other Ambulatory Visit: Payer: Self-pay | Admitting: Internal Medicine

## 2020-02-20 DIAGNOSIS — J45909 Unspecified asthma, uncomplicated: Secondary | ICD-10-CM

## 2020-02-20 DIAGNOSIS — J4541 Moderate persistent asthma with (acute) exacerbation: Secondary | ICD-10-CM

## 2020-02-20 DIAGNOSIS — E119 Type 2 diabetes mellitus without complications: Secondary | ICD-10-CM

## 2020-02-20 DIAGNOSIS — Z79899 Other long term (current) drug therapy: Secondary | ICD-10-CM

## 2020-02-20 DIAGNOSIS — R059 Cough, unspecified: Secondary | ICD-10-CM

## 2020-02-20 DIAGNOSIS — G4700 Insomnia, unspecified: Secondary | ICD-10-CM

## 2020-02-22 ENCOUNTER — Other Ambulatory Visit: Payer: Self-pay | Admitting: Internal Medicine

## 2020-02-22 DIAGNOSIS — Z961 Presence of intraocular lens: Secondary | ICD-10-CM | POA: Diagnosis not present

## 2020-02-22 DIAGNOSIS — H2512 Age-related nuclear cataract, left eye: Secondary | ICD-10-CM | POA: Diagnosis not present

## 2020-02-22 DIAGNOSIS — H5213 Myopia, bilateral: Secondary | ICD-10-CM | POA: Diagnosis not present

## 2020-02-22 MED ORDER — TOPIRAMATE 50 MG PO TABS: ORAL_TABLET | ORAL | 1 refills | Status: DC

## 2020-02-22 MED ORDER — FUROSEMIDE 40 MG PO TABS: ORAL_TABLET | ORAL | 1 refills | Status: DC

## 2020-02-22 MED ORDER — ROSUVASTATIN CALCIUM 10 MG PO TABS: ORAL_TABLET | ORAL | 3 refills | Status: DC

## 2020-02-22 MED ORDER — AMITRIPTYLINE HCL 10 MG PO TABS: 10.0000 mg | ORAL_TABLET | Freq: Every evening | ORAL | Status: DC | PRN

## 2020-02-22 MED ORDER — PROMETHAZINE-DM 6.25-15 MG/5ML PO SYRP: 5.0000 mL | ORAL_SOLUTION | Freq: Four times a day (QID) | ORAL | 1 refills | Status: DC | PRN

## 2020-02-22 MED ORDER — BENZONATATE 200 MG PO CAPS: ORAL_CAPSULE | ORAL | 1 refills | Status: DC

## 2020-02-22 MED ORDER — ALBUTEROL SULFATE HFA 108 (90 BASE) MCG/ACT IN AERS: 2.0000 | INHALATION_SPRAY | Freq: Four times a day (QID) | RESPIRATORY_TRACT | 1 refills | Status: DC | PRN

## 2020-02-22 MED ORDER — LOSARTAN POTASSIUM 100 MG PO TABS: ORAL_TABLET | ORAL | 3 refills | Status: DC

## 2020-02-22 MED ORDER — METHOCARBAMOL 500 MG PO TABS: 500.0000 mg | ORAL_TABLET | Freq: Three times a day (TID) | ORAL | 0 refills | Status: DC | PRN

## 2020-02-22 MED ORDER — VITAMIN D (ERGOCALCIFEROL) 1.25 MG (50000 UNIT) PO CAPS: ORAL_CAPSULE | ORAL | 2 refills | Status: DC

## 2020-02-22 MED ORDER — METFORMIN HCL ER 500 MG PO TB24: 500.0000 mg | ORAL_TABLET | Freq: Two times a day (BID) | ORAL | 2 refills | Status: DC

## 2020-02-22 MED ORDER — PHENTERMINE HCL 15 MG PO CAPS: 15.0000 mg | ORAL_CAPSULE | ORAL | 0 refills | Status: DC

## 2020-02-22 MED ORDER — FLUTICASONE FUROATE-VILANTEROL 100-25 MCG/INH IN AEPB: 1.0000 | INHALATION_SPRAY | Freq: Every day | RESPIRATORY_TRACT | 2 refills | Status: DC

## 2020-03-02 ENCOUNTER — Other Ambulatory Visit: Payer: Self-pay | Admitting: Adult Health

## 2020-03-07 ENCOUNTER — Ambulatory Visit: Payer: BC Managed Care – PPO | Admitting: Adult Health Nurse Practitioner

## 2020-03-13 NOTE — Progress Notes (Signed)
Patient ID: [name], female    DOB: 1956/07/22, 64 y.o.   MRN: [id number]  Assessment & Plan:   Chest pressure and SOB -     EKG 12-Lead- shows IRBBB no ST changes -     Ambulatory referral to Cardiology Patient with DM, chol, OSA not treated, second hand smoke exposure with family history of CHF/MI presents with some DOE better with rest with sweating, fatigue no other accompaniments and chest pressure with emotional stress from her job loss, no accompaniments.  She has not been walking for the past year so her DOE can be her asthma versus deconditioning- will start on breo daily and get CXR.  Will not start to walk again until after cardiac evaluation with risk factors and family history.  Go to the ER if any chest pain, shortness of breath, nausea, dizziness, severe HA, changes vision/speech   Type 2 diabetes mellitus without complication, without long-term current use of insulin (HCC) -     metFORMIN (GLUCOPHAGE-XR) 500 MG 24 hr tablet; Take 1 tablet (500 mg total) by mouth 2 (two) times daily.  Medication management -     fluticasone furoate-vilanterol (BREO ELLIPTA) 100-25 MCG/INH AEPB; Inhale 1 puff into the lungs daily. Rinse mouth after use. -     amitriptyline (ELAVIL) 10 MG tablet; Take 1 tablet (10 mg total) by mouth at bedtime as needed.  Uncomplicated asthma, unspecified asthma severity, unspecified whether persistent -     fluticasone furoate-vilanterol (BREO ELLIPTA) 100-25 MCG/INH AEPB; Inhale 1 puff into the lungs daily. Rinse mouth after use. GET BACK ON BREO DAILY  Cough GET BACK ON BREO DAILY GET CXR -     benzonatate (TESSALON) 200 MG capsule; TAKE 1 CAPSULE BY MOUTH 3 TIMES A DAY TO PREVENT COUGH -     promethazine-dextromethorphan (PROMETHAZINE-DM) 6.25-15 MG/5ML syrup; Take 5 mLs by mouth 4 (four) times daily as needed for cough. -     montelukast (SINGULAIR) 10 MG tablet; Take 1 tablet Daily for Allergies -     albuterol (PROAIR HFA) 108 (90 Base)  MCG/ACT inhaler; Inhale 2 puffs into the lungs every 6 (six) hours as needed for wheezing.  Moderate persistent asthma with exacerbation -     montelukast (SINGULAIR) 10 MG tablet; Take 1 tablet Daily for Allergies -     albuterol (PROAIR HFA) 108 (90 Base) MCG/ACT inhaler; Inhale 2 puffs into the lungs every 6 (six) hours as needed for wheezing. Resent in breo- get on daily for SOB  Insomnia, unspecified type -     amitriptyline (ELAVIL) 10 MG tablet; Take 1 tablet (10 mg total) by mouth at bedtime as needed.  Refill medications -     losartan (COZAAR) 100 MG tablet; Take 1 tablet Daily for BP -     methocarbamol (ROBAXIN) 500 MG tablet; Take 1 tablet (500 mg total) by mouth every 8 (eight) hours as needed for muscle spasms. -     rosuvastatin (CRESTOR) 10 MG tablet; TAKE 1 TABLET BY MOUTH EVERY MONDAY, WEDNESDAY, AND FRIDAY -     furosemide (LASIX) 40 MG tablet; TAKE 1 TABLET EVERY MORNING FOR BLOOD PRESSURE AND FLUID -     Vitamin D, Ergocalciferol, (DRISDOL) 1.25 MG (50000 UNIT) CAPS capsule; TAKE 1 CAPSULE BY MOUTH 2 DAYS A WEEK     Subjective:   HPI 64 y.o. obese WF with history of chol, OSA and NOT on CPAP, DM2, CKD2 presents with cardiac concerns.   She has been walking  twice a day since she was 64 years old, but with COVID she stopped and has not done anything for the past year. She states she will feel some chest pressure with stress from losing her job. She states she has more SOB, sweating and fatigue with 15 mins of working around the house and will recover after 10 mins, no chest pain during that time.   She takes lasix daily for bilateral leg swelling. Will have some cough/wheezing, has asthma and will do breo as needed and albuterol as needed.   She states her mom had heart failure in her 32's- died at 85.  Her sister has heart failure in her 40's and has had a heart attack s/p stenets in her 34's and has history of stroke.  Brother with valve replacement due to  rheumatic fever.  She will not have insurance after Jan or will need to find it and she has been concerned about her heart.   She has never smoked but she has had a lot of secondary exposure.   Lab Results  Component Value Date   HGBA1C 6.4 (H) 01/25/2020   Lab Results  Component Value Date   CHOL 143 01/25/2020   HDL 57 01/25/2020   LDLCALC 67 01/25/2020   TRIG 106 01/25/2020   CHOLHDL 2.5 01/25/2020    Blood pressure 128/88, pulse 66, temperature 98.8 F (37.1 C), weight 219 lb (99.3 kg), SpO2 97 %.  Her family history includes Allergies in her mother; Asthma in her mother; Brain cancer in her father; Breast cancer in her mother; Colon cancer in her paternal grandmother; Emphysema in her mother; Heart disease in her mother; Hypertension in her brother; Prostate cancer in her father.  Medications  Current Outpatient Medications (Endocrine & Metabolic):  .  levothyroxine (SYNTHROID) 75 MCG tablet, TAKE 1 TABLET BY MOUTH EVERY DAY ON EMPTY STOMACH WITH WATER. NO ANTACID MEDS FOR 30 MINS .  metFORMIN (GLUCOPHAGE-XR) 500 MG 24 hr tablet, Take 1 tablet (500 mg total) by mouth 2 (two) times daily.  Current Outpatient Medications (Cardiovascular):  .  furosemide (LASIX) 40 MG tablet, TAKE 1 TABLET EVERY MORNING FOR BLOOD PRESSURE AND FLUID .  losartan (COZAAR) 100 MG tablet, Take 1 tablet Daily for BP .  rosuvastatin (CRESTOR) 10 MG tablet, TAKE 1 TABLET BY MOUTH EVERY MONDAY, WEDNESDAY, AND FRIDAY  Current Outpatient Medications (Respiratory):  .  albuterol (PROAIR HFA) 108 (90 Base) MCG/ACT inhaler, Inhale 2 puffs into the lungs every 6 (six) hours as needed for wheezing. .  benzonatate (TESSALON) 200 MG capsule, TAKE 1 CAPSULE BY MOUTH 3 TIMES A DAY TO PREVENT COUGH .  fluticasone furoate-vilanterol (BREO ELLIPTA) 100-25 MCG/INH AEPB, Inhale 1 puff into the lungs daily. Rinse mouth after use. .  montelukast (SINGULAIR) 10 MG tablet, Take 1 tablet Daily for Allergies .   promethazine-dextromethorphan (PROMETHAZINE-DM) 6.25-15 MG/5ML syrup, Take 5 mLs by mouth 4 (four) times daily as needed for cough.  Current Outpatient Medications (Analgesics):  .  acetaminophen (TYLENOL) 500 MG tablet, Take 1,000 mg by mouth every 6 (six) hours as needed for mild pain, moderate pain or headache.   Current Outpatient Medications (Other):  .  amitriptyline (ELAVIL) 10 MG tablet, Take 1 tablet (10 mg total) by mouth at bedtime as needed. .  Blood Glucose Monitoring Suppl DEVI, Use to test blood sugar once daily. Dx: E11.22, N18.2 .  glucose blood (FREESTYLE LITE) test strip, Use to check blood sugar once daily. Dx: E11.22, N18.2 .  Lancets MISC, Use to check blood sugar once daily. Dx: E11.22, N18.2 .  methocarbamol (ROBAXIN) 500 MG tablet, Take 1 tablet (500 mg total) by mouth every 8 (eight) hours as needed for muscle spasms. .  pantoprazole (PROTONIX) 40 MG tablet, TAKE 1 TABLET DAILY FOR HEARTBURN & INDIGESTION .  sertraline (ZOLOFT) 100 MG tablet, TAKE 1 OR 2 TABLETS BY MOUTH DAILY FOR ANXIETY .  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   Review of Systems  Constitutional: Positive for diaphoresis and fatigue. Negative for activity change, appetite change, chills, fever and unexpected weight change.  HENT: Negative.   Respiratory: Positive for chest tightness and shortness of breath. Negative for apnea, cough, choking, wheezing and stridor.    Cardiovascular: Positive for chest pain and leg swelling (better with lasix). Negative for palpitations.  Gastrointestinal: Negative.   Genitourinary: Negative.   Musculoskeletal: Negative.   Skin: Negative.  Negative for rash.  Neurological: Negative.   Hematological: Negative.   Psychiatric/Behavioral: Positive for decreased concentration and dysphoric mood. Negative for agitation, behavioral problems, confusion, hallucinations, self-injury, sleep disturbance and suicidal ideas. The patient is nervous/anxious. The patient is not hyperactive.        Objective:   Physical Exam General Appearance: Well nourished, in no apparent distress. Eyes: PERRLA, EOMs, conjunctiva no swelling or erythema Sinuses: No Frontal/maxillary tenderness ENT/Mouth: Ext aud canals clear, TMs without erythema, bulging. No erythema, swelling, or exudate on post pharynx.  Tonsils not swollen or erythematous. Hearing normal.  Neck: Supple, thyroid normal.  Respiratory: Respiratory effort normal, BS equal bilaterally without rales, rhonchi, wheezing or stridor.  Cardio: RRR with no MRGs. Brisk peripheral pulses without edema.  Abdomen: Soft, + BS,  mild epigastric tenderness, no guarding, rebound, hernias, masses. Lymphatics: Non tender without lymphadenopathy.  Musculoskeletal: Full ROM, 5/5 strength, Normal gait,  Skin: Port wine stain right face. Warm, dry without rashes, lesions, ecchymosis.  Neuro: Cranial nerves intact. Normal muscle tone, no cerebellar symptoms. Psych: Awake and oriented X 3, normal affect, Insight and Judgment appropriate

## 2020-03-14 ENCOUNTER — Ambulatory Visit (INDEPENDENT_AMBULATORY_CARE_PROVIDER_SITE_OTHER): Payer: BC Managed Care – PPO | Admitting: Physician Assistant

## 2020-03-14 ENCOUNTER — Encounter: Payer: Self-pay | Admitting: Physician Assistant

## 2020-03-14 ENCOUNTER — Other Ambulatory Visit: Payer: Self-pay

## 2020-03-14 VITALS — BP 128/88 | HR 66 | Temp 98.8°F | Wt 219.0 lb

## 2020-03-14 DIAGNOSIS — E119 Type 2 diabetes mellitus without complications: Secondary | ICD-10-CM | POA: Diagnosis not present

## 2020-03-14 DIAGNOSIS — R059 Cough, unspecified: Secondary | ICD-10-CM

## 2020-03-14 DIAGNOSIS — G4700 Insomnia, unspecified: Secondary | ICD-10-CM

## 2020-03-14 DIAGNOSIS — Z79899 Other long term (current) drug therapy: Secondary | ICD-10-CM | POA: Diagnosis not present

## 2020-03-14 DIAGNOSIS — R05 Cough: Secondary | ICD-10-CM

## 2020-03-14 DIAGNOSIS — R0789 Other chest pain: Secondary | ICD-10-CM

## 2020-03-14 DIAGNOSIS — J45909 Unspecified asthma, uncomplicated: Secondary | ICD-10-CM

## 2020-03-14 DIAGNOSIS — R0602 Shortness of breath: Secondary | ICD-10-CM | POA: Diagnosis not present

## 2020-03-14 DIAGNOSIS — J4541 Moderate persistent asthma with (acute) exacerbation: Secondary | ICD-10-CM

## 2020-03-14 MED ORDER — MONTELUKAST SODIUM 10 MG PO TABS: ORAL_TABLET | ORAL | 0 refills | Status: DC

## 2020-03-14 MED ORDER — VITAMIN D (ERGOCALCIFEROL) 1.25 MG (50000 UNIT) PO CAPS: ORAL_CAPSULE | ORAL | 2 refills | Status: DC

## 2020-03-14 MED ORDER — BENZONATATE 200 MG PO CAPS: ORAL_CAPSULE | ORAL | 1 refills | Status: DC

## 2020-03-14 MED ORDER — PROMETHAZINE-DM 6.25-15 MG/5ML PO SYRP: 5.0000 mL | ORAL_SOLUTION | Freq: Four times a day (QID) | ORAL | 1 refills | Status: DC | PRN

## 2020-03-14 MED ORDER — FLUTICASONE FUROATE-VILANTEROL 100-25 MCG/INH IN AEPB: 1.0000 | INHALATION_SPRAY | Freq: Every day | RESPIRATORY_TRACT | 2 refills | Status: DC

## 2020-03-14 MED ORDER — METFORMIN HCL ER 500 MG PO TB24: 500.0000 mg | ORAL_TABLET | Freq: Two times a day (BID) | ORAL | 2 refills | Status: DC

## 2020-03-14 MED ORDER — METHOCARBAMOL 500 MG PO TABS: 500.0000 mg | ORAL_TABLET | Freq: Three times a day (TID) | ORAL | 0 refills | Status: DC | PRN

## 2020-03-14 MED ORDER — ROSUVASTATIN CALCIUM 10 MG PO TABS: ORAL_TABLET | ORAL | 3 refills | Status: DC

## 2020-03-14 MED ORDER — ALBUTEROL SULFATE HFA 108 (90 BASE) MCG/ACT IN AERS: 2.0000 | INHALATION_SPRAY | Freq: Four times a day (QID) | RESPIRATORY_TRACT | 1 refills | Status: DC | PRN

## 2020-03-14 MED ORDER — FUROSEMIDE 40 MG PO TABS: ORAL_TABLET | ORAL | 1 refills | Status: DC

## 2020-03-14 MED ORDER — AMITRIPTYLINE HCL 10 MG PO TABS: 10.0000 mg | ORAL_TABLET | Freq: Every evening | ORAL | Status: DC | PRN

## 2020-03-14 MED ORDER — LOSARTAN POTASSIUM 100 MG PO TABS: ORAL_TABLET | ORAL | 3 refills | Status: DC

## 2020-03-14 NOTE — Patient Instructions (Signed)
INFORMATION ABOUT YOUR XRAY [facility] Can walk into [address] building for an Insurance account manager. They will have the order and take you back. You do not any paper work, I should get the result back today or tomorrow. This order is good for a year.  Can call [phone] to schedule an appointment if you wish.   WOMEN AND HEART ATTACKS  Being a woman you may not have the typical symptoms of a heart attack.  You may not have any pain OR you may have atypical pain such as jaw pain, upper back pain, arm pain, "my bra feels to tight" and you will often have symptoms with it like below.  Symptoms for a heart attack will likely occur when you exert your self or exercise and include: Shortness of breath Sweating Nausea Dizziness Fast or irregular heart beats Fatigue   It makes me feel better if my patients get their heart rate up with exercise once or twice a week and pay close attention to your body. If there is ANY change in your exercise capacity or if you have symptoms above, please STOP and call 911 or call to come to the office.   Here is some information to help you keep your heart healthy: Move it! - Aim for 30 mins of activity every day. Take it slowly at first. Talk to [place] before starting any new exercise program.   Lose it.  -Body Mass Index (BMI) can indicate if you need to lose weight. A healthy range is 18.5-24.9. For a BMI calculator, go to [email or web address]  Waist Management -Excess abdominal fat is a risk factor for heart disease, diabetes, asthma, stroke and more. Ideal waist circumference is less than 35" for women and less than 40" for men.   Eat Right -focus on fruits, vegetables, whole grains, and meals you make yourself. Avoid foods with trans fat and high sugar/sodium content.   Snooze or Snore? - Loud snoring can be a sign of sleep apnea, a significant risk factor for high blood pressure, heart attach, stroke, and heart arrhythmias.  Kick the habit -Quit Smoking!  Avoid second hand smoke. A single cigarette raises your blood pressure for 20 mins and increases the risk of heart attack and stroke for the next 24 hours.   Are Aspirin and Supplements right for you? -Add ENTERIC COATED low dose 81 mg Aspirin daily OR can do every other day if you have easy bruising to protect your heart and head. As well as to reduce risk of Colon Cancer by 20 %, Skin Cancer by 26 % , Melanoma by 46% and Pancreatic cancer by 60%  Say "No to Stress -There may be little you can do about problems that cause stress. However, techniques such as long walks, meditation, and exercise can help you manage it.   Start Now! - Make changes one at a time and set reasonable goals to increase your likelihood of success.

## 2020-03-15 ENCOUNTER — Other Ambulatory Visit: Payer: Self-pay | Admitting: Physician Assistant

## 2020-03-15 DIAGNOSIS — R059 Cough, unspecified: Secondary | ICD-10-CM

## 2020-03-15 DIAGNOSIS — J4541 Moderate persistent asthma with (acute) exacerbation: Secondary | ICD-10-CM

## 2020-03-15 MED ORDER — ALBUTEROL SULFATE HFA 108 (90 BASE) MCG/ACT IN AERS: 2.0000 | INHALATION_SPRAY | Freq: Four times a day (QID) | RESPIRATORY_TRACT | 1 refills | Status: DC | PRN

## 2020-03-22 DIAGNOSIS — Z1231 Encounter for screening mammogram for malignant neoplasm of breast: Secondary | ICD-10-CM | POA: Diagnosis not present

## 2020-03-22 LAB — HM MAMMOGRAPHY

## 2020-04-05 ENCOUNTER — Ambulatory Visit: Payer: Self-pay | Admitting: Cardiology

## 2020-04-12 ENCOUNTER — Other Ambulatory Visit: Payer: Self-pay | Admitting: Adult Health

## 2020-04-13 ENCOUNTER — Encounter: Payer: Self-pay | Admitting: *Deleted

## 2020-05-09 ENCOUNTER — Ambulatory Visit (INDEPENDENT_AMBULATORY_CARE_PROVIDER_SITE_OTHER): Payer: BC Managed Care – PPO | Admitting: Cardiology

## 2020-05-09 ENCOUNTER — Encounter: Payer: Self-pay | Admitting: Cardiology

## 2020-05-09 ENCOUNTER — Other Ambulatory Visit: Payer: Self-pay

## 2020-05-09 VITALS — BP 125/80 | HR 74 | Temp 96.8°F | Ht 63.0 in | Wt 221.6 lb

## 2020-05-09 DIAGNOSIS — I10 Essential (primary) hypertension: Secondary | ICD-10-CM

## 2020-05-09 DIAGNOSIS — R072 Precordial pain: Secondary | ICD-10-CM

## 2020-05-09 DIAGNOSIS — Z7189 Other specified counseling: Secondary | ICD-10-CM

## 2020-05-09 DIAGNOSIS — E785 Hyperlipidemia, unspecified: Secondary | ICD-10-CM | POA: Diagnosis not present

## 2020-05-09 DIAGNOSIS — E119 Type 2 diabetes mellitus without complications: Secondary | ICD-10-CM

## 2020-05-09 DIAGNOSIS — Z01812 Encounter for preprocedural laboratory examination: Secondary | ICD-10-CM

## 2020-05-09 DIAGNOSIS — Z8249 Family history of ischemic heart disease and other diseases of the circulatory system: Secondary | ICD-10-CM

## 2020-05-09 DIAGNOSIS — Z6839 Body mass index (BMI) 39.0-39.9, adult: Secondary | ICD-10-CM

## 2020-05-09 MED ORDER — METOPROLOL TARTRATE 50 MG PO TABS: ORAL_TABLET | ORAL | 0 refills | Status: DC

## 2020-05-09 NOTE — Patient Instructions (Addendum)
Medication Instructions:  Your Physician recommend you continue on your current medication as directed.    *If you need a refill on your cardiac medications before your next appointment, please call your pharmacy*   Lab Work: Your physician recommends that you return for lab work 1 week prior to procedure.  If you have labs (blood work) drawn today and your tests are completely normal, you will receive your results only by: Marland Kitchen MyChart Message (if you have MyChart) OR . A paper copy in the mail If you have any lab test that is abnormal or we need to change your treatment, we will call you to review the results.   Testing/Procedures: Cardiac CT Angiography (CTA), is a special type of CT scan that uses a computer to produce multi-dimensional views of major blood vessels throughout the body. In CT angiography, a contrast material is injected through an IV to help visualize the blood vessels [facility]   Follow-Up: At [facility], you and your health needs are our priority.  As part of our continuing mission to provide you with exceptional heart care, we have created designated Provider Care Teams.  These Care Teams include your primary Cardiologist (physician) and Advanced Practice Providers (APPs -  Physician Assistants and Nurse Practitioners) who all work together to provide you with the care you need, when you need it.  We recommend signing up for the patient portal called "MyChart".  Sign up information is provided on this After Visit Summary.  MyChart is used to connect with patients for Virtual Visits (Telemedicine).  Patients are able to view lab/test results, encounter notes, upcoming appointments, etc.  Non-urgent messages can be sent to your provider as well.   To learn more about what you can do with MyChart, go to [email or web address].    Your next appointment:   1 year(s)  The format for your next appointment:   In Person  Provider:   Buford Dresser,  MD  Your cardiac CT will be scheduled at one of the below locations:   [facility] [address] [place], [place] [address] [phone]  If scheduled at [facility], please arrive at the [facility] main entrance of [facility] 30 minutes prior to test start time. Proceed to the [facility] Radiology Department (first floor) to check-in and test prep.  If scheduled at [facility], please arrive 15 mins early for check-in and test prep.  Please follow these instructions carefully (unless otherwise directed):   On the Night Before the Test: . Be sure to Drink plenty of water. . Do not consume any caffeinated/decaffeinated beverages or chocolate 12 hours prior to your test. . Do not take any antihistamines 12 hours prior to your test.   On the Day of the Test: . Drink plenty of water. Do not drink any water within one hour of the test. . Do not eat any food 4 hours prior to the test. . You may take your regular medications prior to the test.  . Take metoprolol (Lopressor) 50 mg two hours prior to test. . HOLD Furosemide/Hydrochlorothiazide morning of the test. . FEMALES- please wear underwire-free bra if available        After the Test: . Drink plenty of water. . After receiving IV contrast, you may experience a mild flushed feeling. This is normal. . On occasion, you may experience a mild rash up to 24 hours after the test. This is not dangerous. If this occurs,  you can take Benadryl 25 mg and increase your fluid intake. . If you experience trouble breathing, this can be serious. If it is severe call 911 IMMEDIATELY. If it is mild, please call our office. . If you take any of these medications: Glipizide/Metformin, Avandament, Glucavance, please do not take 48 hours after completing test unless otherwise instructed.   Once we have confirmed authorization from your insurance company, we will call you to set up a date and  time for your test. Based on how quickly your insurance processes prior authorizations requests, please allow up to 4 weeks to be contacted for scheduling your Cardiac CT appointment. Be advised that routine Cardiac CT appointments could be scheduled as many as 8 weeks after your provider has ordered it.  For non-scheduling related questions, please contact the cardiac imaging nurse navigator should you have any questions/concerns: Marchia Bond, Cardiac Imaging Nurse Navigator Burley Saver, Interim Cardiac Imaging Nurse [place] [facility] Direct Office Dial: [phone]   For scheduling needs, including cancellations and rescheduling, please call Vivien Rota at [phone], option 3.

## 2020-05-09 NOTE — Progress Notes (Signed)
Cardiology Office Note:    Date:  05/09/2020   ID:  Ahmed Prima, DOB 03-08-1956, MRN [id number]  PCP:  Unk Pinto, MD  Cardiologist:  Buford Dresser, MD  Referring MD: Vicie Mutters, PA-C   CC: new patient consultation for chest pressure and shortness of breath  History of Present Illness:    Kristina Webb is a 64 y.o. female with a hx of hypertension, hyperlipidemia, asthma, OSA not on CPAP who is seen as a new consult at the request of Vicie Mutters, PA-C for the evaluation and management of chest pressure and shortness of breath. Seen in the past by Dr. Wynonia Lawman.   Note from 03/14/20 reviewed. Seen by Vicie Mutters, PA. Noted to have chest pressure, shortness of breath with significant cardiovascular risk factors. Concern was for asthma vs. Cardiac etiology.  Today: Quit walking with Covid pandemic. Had been walking 2 miles/day for decades prior to that. Husband smokes, though she does not. Wants to make sure heart is ok. Started walking again about two weeks ago, now walking about a mile at a time.   Chest pain: -Initial onset/quality: deep ache/sharp pain, started about a year ago -Frequency: about once a month, no clear timing, not exertional or positional -Duration: about a minute -Associated symptoms: none -Aggravating/alleviating factors: no clear factors that change it -Prior cardiac history: none -Prior workup: none -Prior treatment: none -Alcohol: none -Tobacco: she is not, but both parents smoked, husband smokes -Comorbidities: hypertension, hyperlipidemia, type II diabetes, OSA not on CPAP, family history, secondhand smoke exposure -Exercise level: minimal, now back to about a mile a day. -Cardiac ROS: rare PND, no orthopnea, chronic LE edema for about 10 years, no syncope -Family history: mother had heart failure, breast cancer/womb cancer, high blood pressure, diabetes, hypothyroidism, died age 70. Father had high blood pressure, died of brain  tumor. Brother had rheumatic fever, has had valve issues throughout his life. Sister is 42, has had stents for CAD, had a mild stroke, has HTN, no diabetes.  Past Medical History:  Diagnosis Date  . Anemia    hx  . Anxiety   . Arthritis    back  . Asthma   . Depression   . Deviated septum   . GERD (gastroesophageal reflux disease)   . Headache   . History of hiatal hernia   . Hyperlipemia   . Hypertension   . Hypothyroidism   . OSA (obstructive sleep apnea)    does not use -last 6 months  . Pre-diabetes    per pt    Past Surgical History:  Procedure Laterality Date  . ANKLE GANGLION CYST EXCISION Left   . BACK SURGERY    . birth mark treatments    . BREAST LUMPECTOMY WITH RADIOACTIVE SEED LOCALIZATION Left 02/02/2016   Procedure: LEFT BREAST LUMPECTOMY WITH RADIOACTIVE SEED LOCALIZATION;  Surgeon: Autumn Messing III, MD;  Location: [facility];  Service: General;  Laterality: Left;  . BREAST SURGERY Right    papilloma removed,cyst lft  . CATARACT EXTRACTION Right 12/2016   Dr. Harlow Mares  . CHOLECYSTECTOMY N/A 02/20/2017   Procedure: LAPAROSCOPIC CHOLECYSTECTOMY WITH INTRAOPERATIVE CHOLANGIOGRAM;  Surgeon: Jovita Kussmaul, MD;  Location: [facility];  Service: General;  Laterality: N/A;  . COLON SURGERY  2008   resection- endometriosis    Current Medications: Current Outpatient Medications on File Prior to Visit  Medication Sig  . acetaminophen (TYLENOL) 500 MG tablet Take 1,000 mg by mouth every 6 (six) hours as needed for mild pain,  moderate pain or headache.  . albuterol (PROAIR HFA) 108 (90 Base) MCG/ACT inhaler Inhale 2 puffs into the lungs every 6 (six) hours as needed for wheezing.  Marland Kitchen amitriptyline (ELAVIL) 10 MG tablet Take 1 tablet (10 mg total) by mouth at bedtime as needed.  . benzonatate (TESSALON) 200 MG capsule TAKE 1 CAPSULE BY MOUTH 3 TIMES A DAY TO PREVENT COUGH  . Blood Glucose Monitoring Suppl DEVI Use to test blood sugar once daily. Dx: E11.22, N18.2  . fluticasone  furoate-vilanterol (BREO ELLIPTA) 100-25 MCG/INH AEPB Inhale 1 puff into the lungs daily. Rinse mouth after use.  . furosemide (LASIX) 40 MG tablet TAKE 1 TABLET EVERY MORNING FOR BLOOD PRESSURE AND FLUID  . glucose blood (FREESTYLE LITE) test strip Use to check blood sugar once daily. Dx: E11.22, N18.2  . Lancets MISC Use to check blood sugar once daily. Dx: E11.22, N18.2  . levothyroxine (SYNTHROID) 75 MCG tablet TAKE 1 TABLET BY MOUTH EVERY DAY ON EMPTY STOMACH WITH WATER. NO ANTACID MEDS FOR 30 MINS  . losartan (COZAAR) 100 MG tablet Take 1 tablet Daily for BP  . metFORMIN (GLUCOPHAGE-XR) 500 MG 24 hr tablet Take 1 tablet (500 mg total) by mouth 2 (two) times daily.  . methocarbamol (ROBAXIN) 500 MG tablet Take 1 tablet (500 mg total) by mouth every 8 (eight) hours as needed for muscle spasms.  . montelukast (SINGULAIR) 10 MG tablet Take 1 tablet Daily for Allergies  . pantoprazole (PROTONIX) 40 MG tablet TAKE 1 TABLET DAILY FOR HEARTBURN & INDIGESTION  . promethazine-dextromethorphan (PROMETHAZINE-DM) 6.25-15 MG/5ML syrup Take 5 mLs by mouth 4 (four) times daily as needed for cough.  . rosuvastatin (CRESTOR) 10 MG tablet TAKE 1 TABLET BY MOUTH EVERY MONDAY, WEDNESDAY, AND FRIDAY  . sertraline (ZOLOFT) 100 MG tablet TAKE 1 OR 2 TABLETS BY MOUTH DAILY FOR ANXIETY  . Vitamin D, Ergocalciferol, (DRISDOL) 1.25 MG (50000 UNIT) CAPS capsule TAKE 1 CAPSULE BY MOUTH 2 DAYS A WEEK   No current facility-administered medications on file prior to visit.     Allergies:   Levaquin [levofloxacin in d5w] and Doxycycline   Social History   Tobacco Use  . Smoking status: Never Smoker  . Smokeless tobacco: Never Used  . Tobacco comment: husband smokes in the house  Vaping Use  . Vaping Use: Never used  Substance Use Topics  . Alcohol use: No  . Drug use: No    Family History: family history includes Allergies in her mother; Asthma in her mother; Brain cancer in her father; Breast cancer in her  mother; Colon cancer in her paternal grandmother; Emphysema in her mother; Heart disease in her mother; Hypertension in her brother; Prostate cancer in her father. There is no history of Allergic rhinitis, Angioedema, Eczema, Immunodeficiency, or Urticaria.  ROS:   Please see the history of present illness.  Additional pertinent ROS: Constitutional: Negative for chills, fever, night sweats, unintentional weight loss  HENT: Negative for ear pain and hearing loss.   Eyes: Negative for loss of vision and eye pain.  Respiratory: Negative for cough, sputum, wheezing.   Cardiovascular: See HPI. Gastrointestinal: Negative for abdominal pain, melena, and hematochezia.  Genitourinary: Negative for dysuria and hematuria.  Musculoskeletal: Negative for falls and myalgias.  Skin: Negative for itching and rash.  Neurological: Negative for focal weakness, focal sensory changes and loss of consciousness.  Endo/Heme/Allergies: Does not bruise/bleed easily.     EKGs/Labs/Other Studies Reviewed:    The following studies were reviewed today: No  prior cardiac studies  EKG:  EKG is personally reviewed.  The ekg ordered today demonstrates NSR at 74 bpm with incomplete RBBB  Recent Labs: 08/09/2019: Magnesium 2.0 01/25/2020: ALT 26; BUN 18; Creat 0.95; Hemoglobin 12.3; Platelets 309; Potassium 4.0; Sodium 139; TSH 3.79  Recent Lipid Panel    Component Value Date/Time   CHOL 143 01/25/2020 1616   TRIG 106 01/25/2020 1616   HDL 57 01/25/2020 1616   CHOLHDL 2.5 01/25/2020 1616   VLDL 25 03/24/2017 1643   LDLCALC 67 01/25/2020 1616    Physical Exam:    VS:  BP 125/80   Pulse 74   Temp (!) 96.8 F (36 C)   Ht 5' 3"  (1.6 m)   Wt 221 lb 9.6 oz (100.5 kg)   SpO2 97%   BMI 39.25 kg/m     Wt Readings from Last 3 Encounters:  05/09/20 221 lb 9.6 oz (100.5 kg)  03/14/20 219 lb (99.3 kg)  01/25/20 221 lb (100.2 kg)    GEN: Well nourished, well developed in no acute distress HEENT: Normal, moist  mucous membranes NECK: No JVD CARDIAC: regular rhythm, normal S1 and S2, no rubs or gallops. No murmurs. VASCULAR: Radial and DP pulses 2+ bilaterally. No carotid bruits RESPIRATORY:  Clear to auscultation without rales, wheezing or rhonchi  ABDOMEN: Soft, non-tender, non-distended MUSCULOSKELETAL:  Ambulates independently SKIN: Warm and dry, no edema NEUROLOGIC:  Alert and oriented x 3. No focal neuro deficits noted. PSYCHIATRIC:  Normal affect    ASSESSMENT:    1. Precordial pain   2. Essential hypertension   3. Hyperlipidemia, unspecified hyperlipidemia type   4. Type 2 diabetes mellitus without complication, without long-term current use of insulin (HCC)   5. Cardiac risk counseling   6. Family history of heart disease   7. Counseling on health promotion and disease prevention   8. Class 2 severe obesity due to excess calories with serious comorbidity and body mass index (BMI) of 39.0 to 39.9 in adult ([place])   9. Pre-procedure lab exam    PLAN:    Chest pain: -discussed treadmill stress, nuclear stress/lexiscan, and CT coronary angiography. Discussed pros and cons of each, including but not limited to false positive/false negative risk, radiation risk, and risk of IV contrast dye. Based on shared decision making, decision was made to pursue CT coronary angiography. -will give one time dose of metoprolol 2 hours prior to scheduled test -counseled on need to get BMET prior to test -counseled on use of sublingual nitroglycerin and its importance to a good test  Hypertension: well controlled today -continue losartan, furosemide  Hyperlipidemia: -continue rosuvastatin -lipids reviewed, last LD 67, TG 106  Type II diabetes, not on insulin: -currently on metformin -if CAD seen, would consider SGLT2i or GLP1RA  Obesity: BMI 39 -discussed lifestyle  Cardiac risk counseling and prevention recommendations: with family history of heart disease -recommend heart  healthy/Mediterranean diet, with whole grains, fruits, vegetable, fish, lean meats, nuts, and olive oil. Limit salt. -recommend moderate walking, 3-5 times/week for 30-50 minutes each session. Aim for at least 150 minutes.week. Goal should be pace of 3 miles/hours, or walking 1.5 miles in 30 minutes -recommend avoidance of tobacco products. Avoid excess alcohol. -ASCVD risk score: The 10-year ASCVD risk score Mikey Bussing DC Jr., et al., 2013) is: 7.7%   Values used to calculate the score:     Age: 29 years     Sex: Female     Is Non-Hispanic African American: No  Diabetic: Yes     Tobacco smoker: No     Systolic Blood Pressure: 952 mmHg     Is BP treated: Yes     HDL Cholesterol: 57 mg/dL     Total Cholesterol: 143 mg/dL    Plan for follow up: if CT normal, follow up in one year  Buford Dresser, MD, PhD [place]  [facility]    Medication Adjustments/Labs and Tests Ordered: Current medicines are reviewed at length with the patient today.  Concerns regarding medicines are outlined above.  Orders Placed This Encounter  Procedures  . CT CORONARY MORPH W/CTA COR W/SCORE W/CA W/CM &/OR WO/CM  . CT CORONARY FRACTIONAL FLOW RESERVE DATA PREP  . CT CORONARY FRACTIONAL FLOW RESERVE FLUID ANALYSIS  . Basic metabolic panel  . EKG 12-Lead   Meds ordered this encounter  Medications  . metoprolol tartrate (LOPRESSOR) 50 MG tablet    Sig: TAKE 1 TABLET 2 HR PRIOR TO CARDIAC PROCEDURE    Dispense:  1 tablet    Refill:  0    Patient Instructions  Medication Instructions:  Your Physician recommend you continue on your current medication as directed.    *If you need a refill on your cardiac medications before your next appointment, please call your pharmacy*   Lab Work: Your physician recommends that you return for lab work 1 week prior to procedure.  If you have labs (blood work) drawn today and your tests are completely normal, you will receive your results only  by: Marland Kitchen MyChart Message (if you have MyChart) OR . A paper copy in the mail If you have any lab test that is abnormal or we need to change your treatment, we will call you to review the results.   Testing/Procedures: Cardiac CT Angiography (CTA), is a special type of CT scan that uses a computer to produce multi-dimensional views of major blood vessels throughout the body. In CT angiography, a contrast material is injected through an IV to help visualize the blood vessels [facility]   Follow-Up: At [facility], you and your health needs are our priority.  As part of our continuing mission to provide you with exceptional heart care, we have created designated Provider Care Teams.  These Care Teams include your primary Cardiologist (physician) and Advanced Practice Providers (APPs -  Physician Assistants and Nurse Practitioners) who all work together to provide you with the care you need, when you need it.  We recommend signing up for the patient portal called "MyChart".  Sign up information is provided on this After Visit Summary.  MyChart is used to connect with patients for Virtual Visits (Telemedicine).  Patients are able to view lab/test results, encounter notes, upcoming appointments, etc.  Non-urgent messages can be sent to your provider as well.   To learn more about what you can do with MyChart, go to [email or web address].    Your next appointment:   1 year(s)  The format for your next appointment:   In Person  Provider:   Buford Dresser, MD  Your cardiac CT will be scheduled at one of the below locations:   [facility] [address] [place], [place] [address] [phone]  If scheduled at [facility], please arrive at the [facility] main entrance of [facility] 30 minutes prior to test start time. Proceed to the [facility] Radiology Department (first floor) to check-in and test prep.  If scheduled at [facility], please arrive  15 mins early for check-in and test prep.  Please follow these instructions carefully (unless otherwise directed):   On the Night Before the Test: . Be sure to Drink plenty of water. . Do not consume any caffeinated/decaffeinated beverages or chocolate 12 hours prior to your test. . Do not take any antihistamines 12 hours prior to your test.   On the Day of the Test: . Drink plenty of water. Do not drink any water within one hour of the test. . Do not eat any food 4 hours prior to the test. . You may take your regular medications prior to the test.  . Take metoprolol (Lopressor) 50 mg two hours prior to test. . HOLD Furosemide/Hydrochlorothiazide morning of the test. . FEMALES- please wear underwire-free bra if available        After the Test: . Drink plenty of water. . After receiving IV contrast, you may experience a mild flushed feeling. This is normal. . On occasion, you may experience a mild rash up to 24 hours after the test. This is not dangerous. If this occurs, you can take Benadryl 25 mg and increase your fluid intake. . If you experience trouble breathing, this can be serious. If it is severe call 911 IMMEDIATELY. If it is mild, please call our office. . If you take any of these medications: Glipizide/Metformin, Avandament, Glucavance, please do not take 48 hours after completing test unless otherwise instructed.   Once we have confirmed authorization from your insurance company, we will call you to set up a date and time for your test. Based on how quickly your insurance processes prior authorizations requests, please allow up to 4 weeks to be contacted for scheduling your Cardiac CT appointment. Be advised that routine Cardiac CT appointments could be scheduled as many as 8 weeks after your provider has ordered it.  For non-scheduling related questions, please contact the cardiac imaging nurse navigator should you have any  questions/concerns: Marchia Bond, Cardiac Imaging Nurse Navigator Burley Saver, Interim Cardiac Imaging Nurse [facility] Direct Office Dial: [phone]   For scheduling needs, including cancellations and rescheduling, please call Vivien Rota at [phone], option 3.      Signed, Buford Dresser, MD PhD 05/09/2020  [place]

## 2020-05-11 ENCOUNTER — Encounter: Payer: Self-pay | Admitting: Cardiology

## 2020-05-18 ENCOUNTER — Ambulatory Visit: Payer: BC Managed Care – PPO | Admitting: Podiatry

## 2020-05-18 DIAGNOSIS — Z01812 Encounter for preprocedural laboratory examination: Secondary | ICD-10-CM | POA: Diagnosis not present

## 2020-05-19 LAB — BASIC METABOLIC PANEL
BUN/Creatinine Ratio: 17 (ref 12–28)
BUN: 15 mg/dL (ref 8–27)
CO2: 24 mmol/L (ref 20–29)
Calcium: 9.4 mg/dL (ref 8.7–10.3)
Chloride: 103 mmol/L (ref 96–106)
Creatinine, Ser: 0.9 mg/dL (ref 0.57–1.00)
GFR calc Af Amer: 79 mL/min/{1.73_m2} (ref >59)
GFR calc non Af Amer: 69 mL/min/{1.73_m2} (ref >59)
Glucose: 115 mg/dL — ABNORMAL HIGH (ref 65–99)
Potassium: 4.9 mmol/L (ref 3.5–5.2)
Sodium: 143 mmol/L (ref 134–144)

## 2020-05-24 ENCOUNTER — Telehealth (HOSPITAL_COMMUNITY): Payer: Self-pay | Admitting: Emergency Medicine

## 2020-05-24 NOTE — Telephone Encounter (Signed)
Reaching out to patient to offer assistance regarding upcoming cardiac imaging study; pt verbalizes understanding of appt date/time, parking situation and where to check in, pre-test NPO status and medications ordered, and verified current allergies; name and call back number provided for further questions should they arise Marchia Bond RN Navigator Cardiac Imaging [facility] [phone] office [phone] cell  Pt holding inhalers, lasix; taking metoprolol 2 hr prior to scan

## 2020-05-25 ENCOUNTER — Encounter: Payer: Self-pay | Admitting: Podiatry

## 2020-05-25 ENCOUNTER — Other Ambulatory Visit: Payer: Self-pay

## 2020-05-25 ENCOUNTER — Ambulatory Visit (INDEPENDENT_AMBULATORY_CARE_PROVIDER_SITE_OTHER): Payer: BC Managed Care – PPO

## 2020-05-25 ENCOUNTER — Ambulatory Visit (INDEPENDENT_AMBULATORY_CARE_PROVIDER_SITE_OTHER): Payer: BC Managed Care – PPO | Admitting: Podiatry

## 2020-05-25 DIAGNOSIS — M2042 Other hammer toe(s) (acquired), left foot: Secondary | ICD-10-CM

## 2020-05-25 DIAGNOSIS — M779 Enthesopathy, unspecified: Secondary | ICD-10-CM

## 2020-05-25 DIAGNOSIS — S9032XA Contusion of left foot, initial encounter: Secondary | ICD-10-CM

## 2020-05-25 NOTE — Progress Notes (Signed)
Subjective:   Patient ID: Kristina Webb, female   DOB: 64 y.o.   MRN: [id number]   HPI Patient presents stating I have had pain in my left foot and it hurts if I wear shoe gear too long and I have a separation of my digits that is occurred over the last several years.  Patient does not smoke likes to be active   Review of Systems  All other systems reviewed and are negative.       Objective:  Physical Exam Vitals and nursing note reviewed.  Constitutional:      Appearance: She is well-developed.  Pulmonary:     Effort: Pulmonary effort is normal.  Musculoskeletal:        General: Normal range of motion.  Skin:    General: Skin is warm.  Neurological:     Mental Status: She is alert.     Neurovascular status intact muscle strength found to be adequate range of motion within normal limits.  Patient is found to have inflammation pain second metatarsal phalangeal joint left with fluid buildup around the joint that is painful when pressed and make shoe gear difficult.  Patient has slight separation and elevation of the second toe and medial dislocation of a mild nature.  Patient has good digital perfusion well oriented x3     Assessment:  Inflammatory capsulitis second MPJ left with probability for mild flexor plate dislocation with stretching of the capsule     Plan:  H&P reviewed condition and I went ahead and did 60 mg like Marcaine mixture of the second MPJ to numb the area.  I then did sterile prep and aspirated the joint getting out a small amount of clear fluid and injected quarter cc dexamethasone Kenalog and applied padding to reduce pressure on the joint surface.  Patient will be seen back as needed and was advised on rigid bottom shoes  X-rays indicate mild elevation second digit left with slight medial dislocation of the toe

## 2020-05-26 ENCOUNTER — Ambulatory Visit (HOSPITAL_COMMUNITY)
Admission: RE | Admit: 2020-05-26 | Discharge: 2020-05-26 | Disposition: A | Discharge status: Home / Self Care | Payer: BC Managed Care – PPO | Source: Ambulatory Visit | Attending: Cardiology | Admitting: Cardiology

## 2020-05-26 DIAGNOSIS — R072 Precordial pain: Secondary | ICD-10-CM | POA: Insufficient documentation

## 2020-05-26 MED ORDER — IOHEXOL 350 MG/ML SOLN
80.0000 mL | Freq: Once | INTRAVENOUS | Status: AC | PRN
  Administered 2020-05-26: 80 mL via INTRAVENOUS

## 2020-05-26 MED ORDER — NITROGLYCERIN 0.4 MG SL SUBL
SUBLINGUAL_TABLET | SUBLINGUAL | Status: AC
  Administered 2020-05-26: 0.8 mg
  Filled 2020-05-26: qty 2

## 2020-05-26 MED ORDER — NITROGLYCERIN 0.4 MG SL SUBL: 0.8000 mg | SUBLINGUAL_TABLET | Freq: Once | SUBLINGUAL | Status: AC

## 2020-06-02 ENCOUNTER — Other Ambulatory Visit: Payer: Self-pay | Admitting: Internal Medicine

## 2020-06-02 DIAGNOSIS — I10 Essential (primary) hypertension: Secondary | ICD-10-CM

## 2020-06-02 MED ORDER — BISOPROLOL FUMARATE 10 MG PO TABS: ORAL_TABLET | ORAL | 0 refills | Status: DC

## 2020-06-07 ENCOUNTER — Other Ambulatory Visit: Payer: Self-pay | Admitting: Adult Health

## 2020-06-12 ENCOUNTER — Other Ambulatory Visit: Payer: Self-pay | Admitting: Internal Medicine

## 2020-06-12 DIAGNOSIS — I10 Essential (primary) hypertension: Secondary | ICD-10-CM

## 2020-06-12 MED ORDER — OLMESARTAN MEDOXOMIL 40 MG PO TABS: ORAL_TABLET | ORAL | 0 refills | Status: DC

## 2020-06-14 ENCOUNTER — Other Ambulatory Visit: Payer: Self-pay

## 2020-06-14 ENCOUNTER — Ambulatory Visit (INDEPENDENT_AMBULATORY_CARE_PROVIDER_SITE_OTHER): Payer: BC Managed Care – PPO

## 2020-06-14 DIAGNOSIS — Z23 Encounter for immunization: Secondary | ICD-10-CM

## 2020-06-28 ENCOUNTER — Other Ambulatory Visit: Payer: Self-pay | Admitting: Internal Medicine

## 2020-06-28 DIAGNOSIS — I10 Essential (primary) hypertension: Secondary | ICD-10-CM

## 2020-06-28 MED ORDER — MINOXIDIL 2.5 MG PO TABS: ORAL_TABLET | ORAL | 0 refills | Status: DC

## 2020-07-10 ENCOUNTER — Telehealth: Payer: Self-pay | Admitting: Neurology

## 2020-07-10 ENCOUNTER — Telehealth: Payer: Self-pay

## 2020-07-10 NOTE — Telephone Encounter (Signed)
We haven't seen Kristina Webb since 2017, and she has cancelled appointments 4 times - Kristina Webb noted in 2017 that she had not used her CPAP.  She reported increasing headaches and cervicalgia at the time. I am sorry, but this is grounds for dismissal, as stated on May 19 th- we would not be able to see her at [facility].

## 2020-07-10 NOTE — Telephone Encounter (Signed)
Pt has called today to schedule appt with Dr. Brett Fairy. After reviewing pt's chart, she is unable to reschedule with [place] due to multiple cancellations. Pt was informed of this in June at last cancellation. This decision has been made by MD.

## 2020-07-10 NOTE — Telephone Encounter (Signed)
Sorry, she was made aware of this policy when she cancelled the 4th or 5th time.

## 2020-07-11 NOTE — Telephone Encounter (Signed)
Looks like Dr. Brett Fairy has agreed to dismiss this patient in a separate tele note in pt's chart.

## 2020-07-12 NOTE — Telephone Encounter (Signed)
I have contacted the pt to make her aware that after discussing in further detail with our management team and Dr Brett Fairy, she has agreed to allow the patient another chance to have the sleep consult.   There was no answer.  Left a detailed message advising that Dr Brett Fairy has agreed to her in sleep consult if she is willing to commit to the apt. Advised that she should call the office back and schedule the sleep consult only if she has intention of keeping that appointment. We would not be able to keep cancelling appointments.  At this point we will wait to see if the patient returns call.

## 2020-07-13 NOTE — Telephone Encounter (Signed)
I further discussed with the pt that if she is unable to keep this appt we will not be able to reschedule her. Pt verbalized understanding.

## 2020-07-17 NOTE — Patient Instructions (Signed)

## 2020-07-17 NOTE — Progress Notes (Addendum)
History of Present Illness:       This very nice 64 y.o.  MWF with Sturge -Weber Syndrome  presents for  follow up with HTN, HLD, T2_NIDDM and Vitamin D Deficiency.  Patient  also has hx/o OSA with hx/mask intolerance vs poor compliance and has conditional f/u pending keeping appt compliance. Patient has GERD controlled on her meds.      Patient is treated for HTN (2008) & BP has been controlled at home. Today's BP is at goal - 116/76. Patient had recent cardiac consultation by Dr Buford Dresser and patient had a Cardiac Ca/CT on 26 May 2020 which showed No Evidence of CAD.  Patient has had no complaints of any cardiac type chest pain, palpitations, dyspnea / orthopnea / PND, dizziness, claudication, or dependent edema.       Hyperlipidemia is controlled with diet & meds. Patient denies myalgias or other med SE's. Last Lipids were at goal:  Lab Results  Component Value Date   CHOL 143 01/25/2020   HDL 57 01/25/2020   LDLCALC 67 01/25/2020   TRIG 106 01/25/2020   CHOLHDL 2.5 01/25/2020    Also, the patient has  Morbid Obesity (BMI 39+) and consequent  T2_NIDDM (2012)  and has had no symptoms of reactive hypoglycemia, diabetic polys, paresthesias or visual blurring.  Last A1c was not at goal:  Lab Results  Component Value Date   HGBA1C 6.4 (H) 01/25/2020        Patient has Hypothyroidism since 2013 & is on replacement.        Further, the patient also has history of Vitamin D Deficiency ("40" /2015) and supplements vitamin D without any suspected side-effects. Last vitamin D was at goal:  Lab Results  Component Value Date   VD25OH 74 01/25/2020    Current Outpatient Medications on File Prior to Visit  Medication Sig  . aTYLENOL 500 MG tablet Take 1,000 mg bevery 6 hours as needed   . albuterol HFA  inhaler Inhale 2 puffs every 6 hours as needed   . amitriptyline  10 MG tablet Take 1 tablet  at bedtime as needed.  . bisoprolol  10 MG tablet Take    1/2 to 1 tablet      Daily  . BREO ELLIPTA 100-25  Inhale 1 puff  daily.   . furosemide  40 MG tablet TAKE 1 TABLET EVERY MORNING   . levothyroxine 75 MCG tablet TAKE 1 TABLET EVERY DAY   . metFORMIN-XR) 500 MG  Take 1 tablet 2  times daily.  . methocarbamol500 MG tablet Take 1 tablet  every 8 hours as needed   . minoxidil  2.5 MG tablet Take     1 to 2 tablets /day  . Montelukast  10 MG tablet Take 1 tablet Daily   . olmesartan  40 MG tablet Take     1 tablet     Daily  . pantoprazole 40 MG tablet TAKE 1 TABLET DAILY   . rosuvastatin  10 MG tablet Take     1 tablet    3 x /week       on Mon Wed Fri  . sertraline  100 MG tablet TAKE 1 OR 2 TABLETS DAILY FOR ANXIETY  . Vitamin D  50,000 U TAKE 1 CAPSULE 2 DAYS A WEEK    Allergies  Allergen Reactions  . Levaquin [Levofloxacin In D5w] Ankles hurt really bad - ? ACHILLES TENDON         .  Doxycycline ? rxn    PMHx:   Past Medical History:  Diagnosis Date  . Anemia    hx  . Anxiety   . Arthritis    back  . Asthma   . Depression   . Deviated septum   . GERD (gastroesophageal reflux disease)   . Headache   . History of hiatal hernia   . Hyperlipemia   . Hypertension   . Hypothyroidism   . OSA (obstructive sleep apnea)    does not use -last 6 months  . Pre-diabetes    per pt    Immunization History  Administered Date(s) Administered  . Influenza Split 04/26/2013, 05/29/2015  . Influenza Whole 05/26/2012  . Influenza-Unspecified 05/19/2018, 05/21/2019  . Pneumococcal Polysaccharide-23 09/10/2016  . Td 08/27/2007, 10/22/2017    Past Surgical History:  Procedure Laterality Date  . ANKLE GANGLION CYST EXCISION Left   . BACK SURGERY    . birth mark treatments    . BREAST LUMPECTOMY WITH RADIOACTIVE SEED LOCALIZATION Left 02/02/2016   Procedure: LEFT BREAST LUMPECTOMY WITH RADIOACTIVE SEED LOCALIZATION;  Surgeon: Autumn Messing III, MD;  Location: [facility];  Service: General;  Laterality: Left;  . BREAST SURGERY Right    papilloma removed,cyst lft   . CATARACT EXTRACTION Right 12/2016   Dr. Harlow Mares  . CHOLECYSTECTOMY N/A 02/20/2017   Procedure: LAPAROSCOPIC CHOLECYSTECTOMY WITH INTRAOPERATIVE CHOLANGIOGRAM;  Surgeon: Jovita Kussmaul, MD;  Location: [facility];  Service: General;  Laterality: N/A;  . COLON SURGERY  2008   resection- endometriosis    FHx:    Reviewed / unchanged  SHx:    Reviewed / unchanged   Systems Review:  Constitutional: Denies fever, chills, wt changes, headaches, insomnia, fatigue, night sweats, change in appetite. Eyes: Denies redness, blurred vision, diplopia, discharge, itchy, watery eyes.  ENT: Denies discharge, congestion, post nasal drip, epistaxis, sore throat, earache, hearing loss, dental pain, tinnitus, vertigo, sinus pain, snoring.  CV: Denies chest pain, palpitations, irregular heartbeat, syncope, dyspnea, diaphoresis, orthopnea, PND, claudication or edema. Respiratory: denies cough, dyspnea, DOE, pleurisy, hoarseness, laryngitis, wheezing.  Gastrointestinal: Denies dysphagia, odynophagia, heartburn, reflux, water brash, abdominal pain or cramps, nausea, vomiting, bloating, diarrhea, constipation, hematemesis, melena, hematochezia  or hemorrhoids. Genitourinary: Denies dysuria, frequency, urgency, nocturia, hesitancy, discharge, hematuria or flank pain. Musculoskeletal: Denies arthralgias, myalgias, stiffness, jt. swelling, pain, limping or strain/sprain.  Skin: Denies pruritus, rash, hives, warts, acne, eczema or change in skin lesion(s). Neuro: No weakness, tremor, incoordination, spasms, paresthesia or pain. Psychiatric: Denies confusion, memory loss or sensory loss. Endo: Denies change in weight, skin or hair change.  Heme/Lymph: No excessive bleeding, bruising or enlarged lymph nodes.  Physical Exam  BP 116/76   Pulse 62   Temp (!) 97.2 F (36.2 C)   Resp 16   Ht 5\' 3"  (1.6 m)   Wt 225 lb 3.2 oz (102.2 kg)   SpO2 98%   BMI 39.89 kg/m   Appears over nourished  and in no distress.  Eyes:  PERRLA, EOMs, conjunctiva no swelling or erythema. Sinuses: No frontal/maxillary tenderness ENT/Mouth: EAC's clear, TM's nl w/o erythema, bulging. Nares clear w/o erythema, swelling, exudates. Oropharynx clear without erythema or exudates. Oral hygiene is good. Tongue normal, non obstructing. Hearing intact.  Neck: Supple. Thyroid not palpable. Car 2+/2+ without bruits, nodes or JVD. Chest: Respirations nl with BS clear & equal w/o rales, rhonchi, wheezing or stridor.  Cor: Heart sounds normal w/ regular rate and rhythm without sig. murmurs, gallops, clicks or rubs. Peripheral pulses normal  and equal  without edema.  Abdomen: Soft & bowel sounds normal. Non-tender w/o guarding, rebound, hernias, masses or organomegaly.  Lymphatics: Unremarkable.  Musculoskeletal: Full ROM all peripheral extremities, joint stability, 5/5 strength and normal gait.  Skin: Warm, dry without exposed rashes, lesions or ecchymosis apparent. 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 Neuro: Cranial nerves intact, reflexes equal bilaterally. Sensory-motor testing grossly intact. Tendon reflexes grossly intact.  Pysch: Alert & oriented x 3.  Insight and judgement nl & appropriate. No ideations.  Assessment and Plan:  1. Essential hypertension  - Continue medication, monitor blood pressure at home.  - Continue DASH diet.  Reminder to go to the ER if any CP,  SOB, nausea, dizziness, severe HA, changes vision/speech.  - CBC with Differential/Platelet - COMPLETE METABOLIC PANEL WITH GFR - Magnesium - TSH  2. Hyperlipidemia associated with type 2 diabetes mellitus ([place]) - Continue diet/meds, exercise,& lifestyle modifications.  - Continue monitor periodic cholesterol/liver & renal functions   - Lipid panel - TSH  3. Type 2 diabetes mellitus with stage 2 chronic kidney  disease, without long-term current use of insulin (H[facility])  - Continue diet, exercise  - Lifestyle modifications.  - Monitor appropriate  labs.  - Hemoglobin A1c - Insulin, random  4. Vitamin D deficiency  - Continue supplementation.  - VITAMIN D 25 Hydroxy  5. Hypothyroidism  - TSH  6. OSA (obstructive sleep apnea)   7. Sturge-Weber syndrome ([place])   8. Medication management  - CBC with Differential/Platelet - COMPLETE METABOLIC PANEL WITH GFR - Magnesium - Lipid panel - TSH - Hemoglobin A1c - Insulin, random - VITAMIN D 25 Hydroxy         Discussed  regular exercise, BP monitoring, weight control to achieve/maintain BMI less than 25 and discussed med and SE's. Recommended labs to assess and monitor clinical status with further disposition pending results of labs.  I discussed the assessment and treatment plan with the patient. The patient was provided an opportunity to ask questions and all were answered. The patient agreed with the plan and demonstrated an understanding of the instructions.  I provided over 30 minutes of exam, counseling, chart review and  complex critical decision making.   Kirtland Bouchard, MD

## 2020-07-18 ENCOUNTER — Other Ambulatory Visit: Payer: Self-pay

## 2020-07-18 ENCOUNTER — Encounter: Payer: Self-pay | Admitting: Internal Medicine

## 2020-07-18 ENCOUNTER — Ambulatory Visit (INDEPENDENT_AMBULATORY_CARE_PROVIDER_SITE_OTHER): Payer: BC Managed Care – PPO | Admitting: Internal Medicine

## 2020-07-18 VITALS — BP 116/76 | HR 62 | Temp 97.2°F | Resp 16 | Ht 63.0 in | Wt 225.2 lb

## 2020-07-18 DIAGNOSIS — E785 Hyperlipidemia, unspecified: Secondary | ICD-10-CM

## 2020-07-18 DIAGNOSIS — E559 Vitamin D deficiency, unspecified: Secondary | ICD-10-CM

## 2020-07-18 DIAGNOSIS — G4733 Obstructive sleep apnea (adult) (pediatric): Secondary | ICD-10-CM

## 2020-07-18 DIAGNOSIS — I10 Essential (primary) hypertension: Secondary | ICD-10-CM

## 2020-07-18 DIAGNOSIS — Q858 Other phakomatoses, not elsewhere classified: Secondary | ICD-10-CM

## 2020-07-18 DIAGNOSIS — E1122 Type 2 diabetes mellitus with diabetic chronic kidney disease: Secondary | ICD-10-CM

## 2020-07-18 DIAGNOSIS — E039 Hypothyroidism, unspecified: Secondary | ICD-10-CM | POA: Diagnosis not present

## 2020-07-18 DIAGNOSIS — Z79899 Other long term (current) drug therapy: Secondary | ICD-10-CM | POA: Diagnosis not present

## 2020-07-18 DIAGNOSIS — E1169 Type 2 diabetes mellitus with other specified complication: Secondary | ICD-10-CM | POA: Diagnosis not present

## 2020-07-18 DIAGNOSIS — Q8589 Other phakomatoses, not elsewhere classified: Secondary | ICD-10-CM

## 2020-07-18 DIAGNOSIS — N182 Chronic kidney disease, stage 2 (mild): Secondary | ICD-10-CM

## 2020-07-19 LAB — CBC WITH DIFFERENTIAL/PLATELET
Absolute Monocytes: 653 cells/uL (ref 200–950)
Basophils Absolute: 48 cells/uL (ref 0–200)
Basophils Relative: 0.5 %
Eosinophils Absolute: 518 cells/uL — ABNORMAL HIGH (ref 15–500)
Eosinophils Relative: 5.4 %
HCT: 36.7 % (ref 35.0–45.0)
Hemoglobin: 11.7 g/dL (ref 11.7–15.5)
Lymphs Abs: 2765 cells/uL (ref 850–3900)
MCH: 26.5 pg — ABNORMAL LOW (ref 27.0–33.0)
MCHC: 31.9 g/dL — ABNORMAL LOW (ref 32.0–36.0)
MCV: 83 fL (ref 80.0–100.0)
MPV: 10.6 fL (ref 7.5–12.5)
Monocytes Relative: 6.8 %
Neutro Abs: 5616 cells/uL (ref 1500–7800)
Neutrophils Relative %: 58.5 %
Platelets: 297 10*3/uL (ref 140–400)
RBC: 4.42 10*6/uL (ref 3.80–5.10)
RDW: 14.6 % (ref 11.0–15.0)
Total Lymphocyte: 28.8 %
WBC: 9.6 10*3/uL (ref 3.8–10.8)

## 2020-07-19 LAB — HEMOGLOBIN A1C
Hgb A1c MFr Bld: 6.9 % of total Hgb — ABNORMAL HIGH (ref <5.7)
Mean Plasma Glucose: 151 (calc)
eAG (mmol/L): 8.4 (calc)

## 2020-07-19 LAB — COMPLETE METABOLIC PANEL WITH GFR
AG Ratio: 1.4 (calc) (ref 1.0–2.5)
ALT: 25 U/L (ref 6–29)
AST: 21 U/L (ref 10–35)
Albumin: 4 g/dL (ref 3.6–5.1)
Alkaline phosphatase (APISO): 68 U/L (ref 37–153)
BUN/Creatinine Ratio: 19 (calc) (ref 6–22)
BUN: 20 mg/dL (ref 7–25)
CO2: 29 mmol/L (ref 20–32)
Calcium: 8.9 mg/dL (ref 8.6–10.4)
Chloride: 103 mmol/L (ref 98–110)
Creat: 1.06 mg/dL — ABNORMAL HIGH (ref 0.50–0.99)
GFR, Est African American: 65 mL/min/{1.73_m2} (ref >=60)
GFR, Est Non African American: 56 mL/min/{1.73_m2} — ABNORMAL LOW (ref >=60)
Globulin: 2.9 g/dL (calc) (ref 1.9–3.7)
Glucose, Bld: 120 mg/dL — ABNORMAL HIGH (ref 65–99)
Potassium: 4.5 mmol/L (ref 3.5–5.3)
Sodium: 142 mmol/L (ref 135–146)
Total Bilirubin: 0.2 mg/dL (ref 0.2–1.2)
Total Protein: 6.9 g/dL (ref 6.1–8.1)

## 2020-07-19 LAB — LIPID PANEL
Cholesterol: 137 mg/dL (ref <200)
HDL: 56 mg/dL (ref >=50)
LDL Cholesterol (Calc): 58 mg/dL (calc)
Non-HDL Cholesterol (Calc): 81 mg/dL (calc) (ref <130)
Total CHOL/HDL Ratio: 2.4 (calc) (ref <5.0)
Triglycerides: 148 mg/dL (ref <150)

## 2020-07-19 LAB — MAGNESIUM: Magnesium: 2.1 mg/dL (ref 1.5–2.5)

## 2020-07-19 LAB — TSH: TSH: 9.36 mIU/L — ABNORMAL HIGH (ref 0.40–4.50)

## 2020-07-19 LAB — INSULIN, RANDOM: Insulin: 18.6 u[IU]/mL

## 2020-07-19 LAB — VITAMIN D 25 HYDROXY (VIT D DEFICIENCY, FRACTURES): Vit D, 25-Hydroxy: 84 ng/mL (ref 30–100)

## 2020-07-19 NOTE — Progress Notes (Signed)
========================================================== -   Test results slightly outside the reference range are not unusual. If there is anything important, I will review this with you,  otherwise it is considered normal test values.  If you have further questions,  please do not hesitate to contact me at the office or via My Chart.  ==========================================================  -  Creatinine level is slightly increased & Kidney Flow Rate  (GFR) is slightly decreased which suggests mild dehydration,   So. . . .  Very important to drink more fluids   - usually recommend 6 bottles  (16 oz ) of water or fluids /day  ==========================================================  -  Total Chol = 137 and LDL Chol =58  - Both  Excellent   - Very low risk for Heart Attack  / Stroke ========================================================  - TSH  (Thyroid) is a little elevated   - Be sure  taking Thyroid hormone  on an empty stomach with only water for 30 minutes & no Antacid meds, Calcium or Magnesium for 4 hours &   - Especially avoid Biotin ==========================================================  -  A1c - is a little Worse, Gone up from 6.4% to now 6.9% -    -  It is very important that you work harder with diet by  avoiding all foods that are white except chicken,   fish & calliflower.  - Avoid white rice  (Screws & wild rice is OK),   - Avoid white potatoes  (sweet potatoes in moderation is OK),   White bread or wheat bread or anything made out of   white flour like bagels, donuts, rolls, buns, biscuits, cakes,  - pastries, cookies, pizza crust, and pasta (made from  white flour & egg whites)   - vegetarian pasta or spinach or wheat pasta is OK.  - Multigrain breads like Arnold's, Pepperidge Farm or   multigrain sandwich thins or high fiber breads like   [place] bread or "Dave's Killer" breads that are  4 to 5 grams fiber per slice !  are best.    -  Diet, exercise and weight loss is very important in the   control and prevention of complications of diabetes which  affects every system in your body, ie.   -Brain - dementia/stroke,  - eyes - glaucoma/blindness,  - heart - heart attack/heart failure,  - kidneys - dialysis,  - stomach - gastric paralysis,  - intestines - malabsorption,  - nerves - severe painful neuritis,  - circulation - gangrene & loss of a leg(s)  - and finally  . . . . . . . . . . . . . . . . . .    - cancer and Alzheimers. ==========================================================  -  Vitamin D = 84 - Excellent  ==========================================================  -  All Else - CBC - Kidneys - Electrolytes - Liver - Magnesium & Thyroid    - all  Normal / OK ==========================================================

## 2020-07-22 ENCOUNTER — Other Ambulatory Visit: Payer: Self-pay | Admitting: Internal Medicine

## 2020-07-22 DIAGNOSIS — I10 Essential (primary) hypertension: Secondary | ICD-10-CM

## 2020-07-24 ENCOUNTER — Other Ambulatory Visit: Payer: Self-pay | Admitting: Internal Medicine

## 2020-07-24 DIAGNOSIS — N6459 Other signs and symptoms in breast: Secondary | ICD-10-CM

## 2020-07-25 DIAGNOSIS — N6320 Unspecified lump in the left breast, unspecified quadrant: Secondary | ICD-10-CM | POA: Diagnosis not present

## 2020-07-26 ENCOUNTER — Other Ambulatory Visit: Payer: Self-pay | Admitting: Obstetrics and Gynecology

## 2020-08-09 ENCOUNTER — Encounter: Payer: Self-pay | Admitting: Neurology

## 2020-08-09 ENCOUNTER — Other Ambulatory Visit: Payer: Self-pay

## 2020-08-09 ENCOUNTER — Ambulatory Visit: Payer: BC Managed Care – PPO | Admitting: Neurology

## 2020-08-09 VITALS — BP 84/41 | HR 59 | Ht 63.5 in | Wt 224.0 lb

## 2020-08-09 DIAGNOSIS — E1122 Type 2 diabetes mellitus with diabetic chronic kidney disease: Secondary | ICD-10-CM | POA: Diagnosis not present

## 2020-08-09 DIAGNOSIS — F324 Major depressive disorder, single episode, in partial remission: Secondary | ICD-10-CM | POA: Diagnosis not present

## 2020-08-09 DIAGNOSIS — J45991 Cough variant asthma: Secondary | ICD-10-CM | POA: Diagnosis not present

## 2020-08-09 DIAGNOSIS — G4733 Obstructive sleep apnea (adult) (pediatric): Secondary | ICD-10-CM | POA: Diagnosis not present

## 2020-08-09 DIAGNOSIS — F99 Mental disorder, not otherwise specified: Secondary | ICD-10-CM

## 2020-08-09 DIAGNOSIS — N182 Chronic kidney disease, stage 2 (mild): Secondary | ICD-10-CM

## 2020-08-09 DIAGNOSIS — F5105 Insomnia due to other mental disorder: Secondary | ICD-10-CM

## 2020-08-09 NOTE — Progress Notes (Signed)
SLEEP MEDICINE CLINIC    Provider:  Larey Seat, MD  Primary Care Physician:  Unk Pinto, MD [address] [place] [address]     Referring Provider: Unk Pinto, [place],  [place] [address]          Chief Complaint according to patient   Patient presents with:    . New Patient (Initial Visit)           HISTORY OF PRESENT ILLNESS:   Chief concern according to patient :   Kristina Webb is a 64  year old  Caucasian female patient is seen upon a new  referral on 08/09/2020 from Dr. Melford Aase, MD. I had the pleasure to follow Kristina Webb until about 5 years ago and I had followed her for the use of CPAP in the treatment of obstructive sleep apnea.  She received a [employer] machine at the time, and machine that is now under recall.  She also quit using her CPAP and stated that since her husband is smoking in the home her CPAP machine dispensed basically smoky air to her.  Of course its not really something I can control from from here, however she had a lot of psychosocial stressors since the pandemic and in June of this year she was let go from her job for 44 years.  Apparently she received some severance package but unemployment may still be needed until she reaches full retirement date. Her blood pressure exacerbated under the stress, today however she presents mother with low blood pressures here and borderline bradycardia.  One of the concerns is that the patient is now on treated for OSA and that her risk factors for cardiac and vascular disease in general are still high.  She had acute insomnia,  chest pressure and shortness of breath when she saw Howell Rucks , PA in her primary care physician's office. That was on 14 March 2020 shortly after she received the message about her drip being discontinued.   She was observed having apnea during a shoulder surgery on the right shoulder- 2019.  She has a history of  diabetes type 2 non-insulin-dependent just on Metformin, history of asthma on Breo on Brio Ellipta, she has coughing spells sometimes interrupting her sleep at night.  I do think that these may very well be also related to the secondhand smoke exposure.     I have the pleasure of seeing Kristina Webb today, a right-handed Caucasian female with a possible sleep disorder.  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  Anemia, Anxiety, Arthritis, smoke induced Asthma, Depression, Deviated septum, GERD (gastroesophageal reflux disease), Tension-Headache, History of hiatal hernia, Hyperlipemia, Hypertension, Hypothyroidism, OSA (obstructive sleep apnea), and diabetes without insulin.   The patient had the first sleep study in the year 2011 but I can't find the original report.    Social history:  Patient is newly unemployed after 44 years on the same job- and her husband is disabled.  she lives in a household with a chain smoking husband , no children. 2 cats.  Caffeine intake in form of Coffee( /) Soda( 2 cokes a day ) Tea ( /) or energy drinks. Regular exercise in form of walking 3 miles a day .   Hobbies : flower garden, croche,  colouring books.     Sleep habits are as follows: The patient's dinner" supper"  time is between 5.3- 6.30 PM. She has started to skip meals,  She feels no appetite , and this is depression related. She snacks. She lost the desire to cook. He smokes in all rooms of the house, a source of tension.  The patient goes to bed at 7-8 PM and is still awake at 2-3 AM, having 'napped"- she feels tired and watches Tv for  many hours while in bed, having RLS, anxiety, getting hot flushes at 68 degrees Fahrenheit.  Has right shoulder pain which affected her sleep position.  The preferred sleep position is supine, with the support of 3 pillows. She reports postnasal drip, coughing, choking.  Dreams are reportedly rare. Marland Kitchen   7-8 AM  is the usual rise time. The patient wakes up spontaneously.  She reports not feeling refreshed or restored in AM, with symptoms such as dry mouth-,  morning headaches-, and residual fatigue. Naps are taken infrequently, lasting from 0.5 -3 hours and are not refreshing .   Review of Systems: Out of a complete 14 system review, the patient complains of only the following symptoms, and all other reviewed systems are negative.: Fatigue, sleepiness , snoring, fragmented sleep, Insomnia  Deteriorated sleep hygiene, untreated OSA, coughing, sinus drainage.    How likely are you to doze in the following situations: 0 = not likely, 1 = slight chance, 2 = moderate chance, 3 = high chance   Sitting and Reading? Watching Television? Sitting inactive in a public place (theater or meeting)? As a passenger in a car for an hour without a break? Lying down in the afternoon when circumstances permit? Sitting and talking to someone? Sitting quietly after lunch without alcohol? In a car, while stopped for a few minutes in traffic?   Total =  3/ 24 points   FSS endorsed at 55/ 63 points.   Social History   Socioeconomic History  . Marital status: Married    Spouse name: Not on file  . Number of children: 0  . Years of education: Not on file  . Highest education level: Not on file  Occupational History  . 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 Smoking status: Never Smoker  . Smokeless tobacco: Never Used  . Tobacco comment: husband smokes in the house  Vaping Use  . Vaping Use: Never used  Substance and Sexual Activity  . Alcohol use: No  . Drug use: No  . Sexual activity: Yes    Partners: Male    Birth control/protection: None, Post-menopausal  Other Topics Concern  . Not on file  Social History Narrative   Drinks about 1/2 can soda daily.   Social Determinants of Health   Financial Resource Strain: Not on file  Food Insecurity: Not on file  Transportation Needs: Not on file   Physical Activity: Not on file  Stress: Not on file  Social Connections: Not on file    Family History  Problem Relation Age of Onset  . Emphysema Mother        smoked  . Allergies Mother   . Asthma Mother   . Heart disease Mother   . Breast cancer Mother        with mets to Bone  . Prostate cancer Father   . Brain cancer Father   . Hypertension Brother   . Colon cancer Paternal Grandmother   . Allergic rhinitis Neg Hx   . Angioedema Neg Hx   . Eczema Neg Hx   . Immunodeficiency Neg Hx   .  Urticaria Neg Hx     Past Medical History:  Diagnosis Date  . Anemia    hx  . Anxiety   . Arthritis    back  . Asthma   . Depression   . Deviated septum   . GERD (gastroesophageal reflux disease)   . Headache   . History of hiatal hernia   . Hyperlipemia   . Hypertension   . Hypothyroidism   . OSA (obstructive sleep apnea)    does not use -last 6 months  . Pre-diabetes    per pt    Past Surgical History:  Procedure Laterality Date  . ANKLE GANGLION CYST EXCISION Left   . BACK SURGERY    . birth mark treatments    . BREAST LUMPECTOMY WITH RADIOACTIVE SEED LOCALIZATION Left 02/02/2016   Procedure: LEFT BREAST LUMPECTOMY WITH RADIOACTIVE SEED LOCALIZATION;  Surgeon: Autumn Messing III, MD;  Location: [facility];  Service: General;  Laterality: Left;  . BREAST SURGERY Right    papilloma removed,cyst lft  . CATARACT EXTRACTION Right 12/2016   Dr. Harlow Mares  . CHOLECYSTECTOMY N/A 02/20/2017   Procedure: LAPAROSCOPIC CHOLECYSTECTOMY WITH INTRAOPERATIVE CHOLANGIOGRAM;  Surgeon: Jovita Kussmaul, MD;  Location: [facility];  Service: General;  Laterality: N/A;  . COLON SURGERY  2008   resection- endometriosis     Current Outpatient Medications on File Prior to Visit  Medication Sig Dispense Refill  . acetaminophen (TYLENOL) 500 MG tablet Take 1,000 mg by mouth every 6 (six) hours as needed for mild pain, moderate pain or headache.    . albuterol (PROAIR HFA) 108 (90 Base) MCG/ACT inhaler Inhale  2 puffs into the lungs every 6 (six) hours as needed for wheezing. 18 g 1  . amitriptyline (ELAVIL) 10 MG tablet Take 1 tablet (10 mg total) by mouth at bedtime as needed.    . bisoprolol (ZEBETA) 10 MG tablet Take    1/2 to 1 tablet     Daily     for BP 90 tablet 0  . Blood Glucose Monitoring Suppl DEVI Use to test blood sugar once daily. Dx: E11.22, N18.2 1 Device 0  . fluticasone furoate-vilanterol (BREO ELLIPTA) 100-25 MCG/INH AEPB Inhale 1 puff into the lungs daily. Rinse mouth after use. 1 each 2  . furosemide (LASIX) 40 MG tablet TAKE 1 TABLET EVERY MORNING FOR BLOOD PRESSURE AND FLUID 90 tablet 1  . glucose blood (FREESTYLE LITE) test strip Use to check blood sugar once daily. Dx: E11.22, N18.2 100 each 2  . Lancets MISC Use to check blood sugar once daily. Dx: E11.22, N18.2 100 each 2  . levothyroxine (SYNTHROID) 75 MCG tablet TAKE 1 TABLET BY MOUTH EVERY DAY ON EMPTY STOMACH WITH WATER. NO ANTACID MEDS FOR 30 MINS 90 tablet 3  . metFORMIN (GLUCOPHAGE-XR) 500 MG 24 hr tablet Take 1 tablet (500 mg total) by mouth 2 (two) times daily. 180 tablet 2  . minoxidil (LONITEN) 2.5 MG tablet Take      2 tablets      Daily       for BP 180 tablet 0  . montelukast (SINGULAIR) 10 MG tablet Take 1 tablet Daily for Allergies 90 tablet 0  . olmesartan (BENICAR) 40 MG tablet Take     1 tablet     Daily      for BP & Diabetic Kidney Protection 90 tablet 0  . pantoprazole (PROTONIX) 40 MG tablet TAKE 1 TABLET DAILY FOR HEARTBURN & INDIGESTION 90 tablet 3  .  rosuvastatin (CRESTOR) 10 MG tablet Take     1 tablet    3 x /week       on Mon Wed Fri 39 tablet 0  . sertraline (ZOLOFT) 100 MG tablet TAKE 1 OR 2 TABLETS BY MOUTH DAILY FOR ANXIETY 180 tablet 1  . Vitamin D, Ergocalciferol, (DRISDOL) 1.25 MG (50000 UNIT) CAPS capsule TAKE 1 CAPSULE BY MOUTH 2 DAYS A WEEK 24 capsule 2   No current facility-administered medications on file prior to visit.    Allergies  Allergen Reactions  . Levaquin [Levofloxacin In  D5w] Other (See Comments)    Ankles hurt really bad ? ACHILLES TENDON ?  Marland Kitchen Doxycycline Other (See Comments)    UNSPECIFIED REACTION     Physical exam:  Today's Vitals   08/09/20 1249  BP: (!) 84/41  Pulse: (!) 59  Weight: 224 lb (101.6 kg)  Height: 5' 3.5" (1.613 m)   Body mass index is 39.06 kg/m.   Wt Readings from Last 3 Encounters:  08/09/20 224 lb (101.6 kg)  07/18/20 225 lb 3.2 oz (102.2 kg)  05/09/20 221 lb 9.6 oz (100.5 kg)     Ht Readings from Last 3 Encounters:  08/09/20 5' 3.5" (1.613 m)  07/18/20 5\' 3"  (1.6 m)  05/09/20 5\' 3"  (1.6 m)      General: The patient is awake, alert and appears not in acute distress.  The patient is well groomed. Head: facial port wine stain.  Neck is supple. Mallampati 3 plus  neck circumference:17 inches . Nasal airflow is patent.  Retrognathia is seen. Cardiovascular:  Regular rate and cardiac rhythm by pulse,  without distended neck veins.  Hypotension today !!! Respiratory: Lungs are clear to auscultation.  Skin: facial skin thickening.  Trunk: The patient's posture is erect.   Neurologic exam : The patient is awake and alert, oriented to place and time.   Memory subjective described as intact.  Attention span & concentration ability appears normal.  Speech is fluent,  without  dysarthria, dysphonia or aphasia.  Mood and affect are appropriate.   Cranial nerves: no loss of smell or taste reported  Pupils are equal and briskly reactive to light. Funduscopic exam deferred.  Extraocular movements in vertical and horizontal planes were intact and without nystagmus. No Diplopia. Visual fields by finger perimetry are intact. Hearing was intact to soft voice and finger rubbing.    Facial sensation intact to fine touch by q-tip.  Facial motor strength is symmetric, but middle branch of the right face is covered by port-wine stain.  The l right upper lip is appearing swollen.  The  tongue and uvula move midline.  Neck ROM :  rotation, tilt and flexion extension were normal for age and shoulder shrug was symmetrical.    Motor exam:  Symmetric bulk, tone and ROM.   Normal tone without cog wheeling, symmetric grip strength . Stiffness in both hands- AND swelling. Had a trigger finger right ring finger    Sensory:  Fine touch, pinprick and vibration were tested  and  normal.  Proprioception tested in the upper extremities was normal.   Coordination: Rapid alternating movements in the fingers/hands were of normal speed. Penmanship unchanged.  The Finger-to-nose maneuver was intact.  Gait and station: Patient could rise unassisted from a seated position, walked without assistive device. She has a slight stoop,  Stance is of wider base and the patient turned with 3.5 steps.  Toe and heel walk were deferred.  Deep tendon  reflexes: in the  upper and lower extremities are symmetric and intact.  Babinski response was deferred .    After spending a total time of 45 minutes face to face and additional time for physical and neurologic examination, review of laboratory studies,  personal review of imaging studies, reports and results of other testing and review of referral information / records as far as provided in visit, I have established the following assessments:  1) OSA and coughing- her OSA will be re assessed , but her coughing is related to second hand smoke- she needs to create the smoke free environment  2) Psychogenic chest pain, insomnia and anxiety- afraid of the future.  Grieving her job. Depressed, not leaving the bed for half the day.  This reactive depression has let to bad sleep habits.  3) obesity,  Shoulder pain, postnasal drain and coughing affect sleep   My Plan is to proceed with:  1) HST or PSG split , we cannot do either before the end of the calendar year, and insurance will run out January 2022.  HST is cheaper. ONO- only test on record was from November 2016.  2) her CPAP is on recall, new CPAPs  are hard to get due to supply chain delays.  3) Cost related - HST .  I would like to thank Unk Pinto, MD for allowing me to meet with and to take care of this pleasant patient.   In short, Kristina Webb is presenting with insomnia, hypersomnia, untreated OSA, Asthma. I plan to follow up either personally or through our NP within 4 month.   CC: I will share my notes with PCP .  Electronically signed by: Larey Seat, MD 08/09/2020 1:15 PM  [facility] Board certified by The [employer] of Sleep Medicine and Diplomate of the [employer] of Sleep Medicine. Board certified In Neurology through the [employer] [place], Fellow of the [employer] of Neurology. Medical Director of [facility].

## 2020-08-09 NOTE — Patient Instructions (Signed)
Safe Surgery and Sleep Apnea Sleep apnea is a condition in which breathing pauses or becomes shallow during sleep. Most people with the condition are not aware that they have it. It is important for your health care providers to know whether or not you have sleep apnea, especially if you are having surgery. Sleep apnea can increase your risk of complications during and after surgery. What is sleep apnea screening? Sleep apnea screening is a test to determine if you are at risk for sleep apnea. Before you have surgery, get screened for sleep apnea and talk with your surgeon and primary health care provider about your results. Screening usually involves answering a list of questions about your sleep quality. Ask your health care provider if you can be screened, or take a screening test yourself. You can find these tests online at the American Sleep Apnea Association website. Some questions you may be asked include:  Do you snore?  Is your sleep restless?  Do you have daytime sleepiness?  Has a partner or spouse told you that you stop breathing during sleep?  Have you had trouble concentrating or memory loss? Answer these questions honestly. If a screening test is positive, this means you are at risk for the condition. Further testing may be needed to confirm a diagnosis of sleep apnea. Why does sleep apnea increase the risk for complications? Untreated sleep apnea increases the risk for certain complications during and after surgery. This is because when you have sleep apnea, your airways are more sensitive to medicines used during surgery. The airways can collapse and block the flow of air.  Having untreated sleep apnea can increase your risk for:  A longer stay in the recovery room or hospital.  Breathing difficulties such as low oxygen levels after surgery.  Increased pain after surgery.  Irregular heart rhythms.  Stroke.  Heart attack. You and your health care provider can take steps  to help prevent these and other complications. What should I do if I have sleep apnea?  Before surgery  Tell your health care provider and anesthesia specialist that you have sleep apnea. Discuss your individual risks based on your screening results, the type of surgery you will be having, and other medical conditions that you have.  If you have a sleep apnea device (positive airway pressure device), wear it as prescribed. If you have not been wearing your device, talk with your health care provider about why you have not been wearing it. There are ways to improve your use of the device, such as: ? Adjusting the mask. ? Adding humidified air. ? Getting treatment for nasal congestion.  Do not use any products that contain nicotine or tobacco, such as cigarettes and e-cigarettes. If you need help quitting, ask your health care provider. On the day of surgery  If instructed by your health care provider, bring your sleep apnea device with you.  Wear your sleep apnea device when you are sleeping during your hospital stay, or as told by your health care provider.  Ask your health care provider what special considerations will be taken during and after your surgery. After surgery  You may need to be given extra oxygen and wear a continuous oxygen monitor (pulse oximetry).  For your safety, you may need to stay in the recovery room or hospital for longer than is normal.  Follow instructions from your health care provider about wearing your sleep device: ? Anytime you are sleeping, including during daytime naps. ? While taking   prescription pain medicines, sleeping medicines, or medicines that make you drowsy.  If your health care provider approves, raise the head of your bed or lie on your side. Do not lie flat on your back.  Follow instructions from your health care provider about medicines: ? Avoid using sleep medicines unless they are prescribed by a health care provider who is aware of  the results of your sleep apnea screening. ? Avoid using sleep medicines while taking opioid pain medicine. ? Limit your use of opioid pain medicines as much as possible. Ask your health care provider what is a safe amount to use. ? Ask about using pain medicines that do not affect your breathing, such as NSAIDs or acetaminophen. Where to find more information For more information about sleep apnea screening and healthy sleep, visit these websites:  Centers for Disease Control and Prevention: [employer]pl  American Sleep Apnea Association: www.sleepapnea.org Contact a health care provider if:  You have sleep apnea or think you may be at risk for sleep apnea, and you are scheduled for surgery. Get help right away if:  You have trouble breathing.  You are very drowsy and cannot stay awake.  You are told that you have pauses in your breathing during sleep after surgery.  You have chest pain.  You have a fast heartbeat. Summary  It is important for your health care providers to know whether or not you have sleep apnea, especially if you are having surgery.  If you have sleep apnea, you are at an increased risk for complications during surgery.  You and your health care provider can take precautions to help prevent complications. If you have sleep apnea, make sure to tell your health care provider and anesthesia specialist. This information is not intended to replace advice given to you by your health care provider. Make sure you discuss any questions you have with your health care provider. Document Revised: 12/04/2018 Document Reviewed: 11/28/2016 Elsevier Patient Education  [place]. Sleep Apnea Sleep apnea is a condition in which breathing pauses or becomes shallow during sleep. Episodes of sleep apnea usually last 10 seconds or longer, and they may occur as many as 20 times an hour. Sleep apnea disrupts your sleep and keeps your body from getting the rest  that it needs. This condition can increase your risk of certain health problems, including:  Heart attack.  Stroke.  Obesity.  Diabetes.  Heart failure.  Irregular heartbeat. What are the causes? There are three kinds of sleep apnea:  Obstructive sleep apnea. This kind is caused by a blocked or collapsed airway.  Central sleep apnea. This kind happens when the part of the brain that controls breathing does not send the correct signals to the muscles that control breathing.  Mixed sleep apnea. This is a combination of obstructive and central sleep apnea. The most common cause of this condition is a collapsed or blocked airway. An airway can collapse or become blocked if:  Your throat muscles are abnormally relaxed.  Your tongue and tonsils are larger than normal.  You are overweight.  Your airway is smaller than normal. What increases the risk? You are more likely to develop this condition if you:  Are overweight.  Smoke.  Have a smaller than normal airway.  Are elderly.  Are female.  Drink alcohol.  Take sedatives or tranquilizers.  Have a family history of sleep apnea. What are the signs or symptoms? Symptoms of this condition include:  Trouble staying asleep.  Daytime  sleepiness and tiredness.  Irritability.  Loud snoring.  Morning headaches.  Trouble concentrating.  Forgetfulness.  Decreased interest in sex.  Unexplained sleepiness.  Mood swings.  Personality changes.  Feelings of depression.  Waking up often during the night to urinate.  Dry mouth.  Sore throat. How is this diagnosed? This condition may be diagnosed with:  A medical history.  A physical exam.  A series of tests that are done while you are sleeping (sleep study). These tests are usually done in a sleep lab, but they may also be done at home. How is this treated? Treatment for this condition aims to restore normal breathing and to ease symptoms during sleep. It  may involve managing health issues that can affect breathing, such as high blood pressure or obesity. Treatment may include:  Sleeping on your side.  Using a decongestant if you have nasal congestion.  Avoiding the use of depressants, including alcohol, sedatives, and narcotics.  Losing weight if you are overweight.  Making changes to your diet.  Quitting smoking.  Using a device to open your airway while you sleep, such as: ? An oral appliance. This is a custom-made mouthpiece that shifts your lower jaw forward. ? A continuous positive airway pressure (CPAP) device. This device blows air through a mask when you breathe out (exhale). ? A nasal expiratory positive airway pressure (EPAP) device. This device has valves that you put into each nostril. ? A bi-level positive airway pressure (BPAP) device. This device blows air through a mask when you breathe in (inhale) and breathe out (exhale).  Having surgery if other treatments do not work. During surgery, excess tissue is removed to create a wider airway. It is important to get treatment for sleep apnea. Without treatment, this condition can lead to:  High blood pressure.  Coronary artery disease.  In men, an inability to achieve or maintain an erection (impotence).  Reduced thinking abilities. Follow these instructions at home: Lifestyle  Make any lifestyle changes that your health care provider recommends.  Eat a healthy, well-balanced diet.  Take steps to lose weight if you are overweight.  Avoid using depressants, including alcohol, sedatives, and narcotics.  Do not use any products that contain nicotine or tobacco, such as cigarettes, e-cigarettes, and chewing tobacco. If you need help quitting, ask your health care provider. General instructions  Take over-the-counter and prescription medicines only as told by your health care provider.  If you were given a device to open your airway while you sleep, use it only as  told by your health care provider.  If you are having surgery, make sure to tell your health care provider you have sleep apnea. You may need to bring your device with you.  Keep all follow-up visits as told by your health care provider. This is important. Contact a health care provider if:  The device that you received to open your airway during sleep is uncomfortable or does not seem to be working.  Your symptoms do not improve.  Your symptoms get worse. Get help right away if:  You develop: ? Chest pain. ? Shortness of breath. ? Discomfort in your back, arms, or stomach.  You have: ? Trouble speaking. ? Weakness on one side of your body. ? Drooping in your face. These symptoms may represent a serious problem that is an emergency. Do not wait to see if the symptoms will go away. Get medical help right away. Call your local emergency services (911 in the [place] Do  not drive yourself to the hospital. Summary  Sleep apnea is a condition in which breathing pauses or becomes shallow during sleep.  The most common cause is a collapsed or blocked airway.  The goal of treatment is to restore normal breathing and to ease symptoms during sleep. This information is not intended to replace advice given to you by your health care provider. Make sure you discuss any questions you have with your health care provider. Document Revised: 01/27/2019 Document Reviewed: 04/07/2018 Elsevier Patient Education  [place].

## 2020-08-11 DIAGNOSIS — R928 Other abnormal and inconclusive findings on diagnostic imaging of breast: Secondary | ICD-10-CM | POA: Diagnosis not present

## 2020-08-11 DIAGNOSIS — N644 Mastodynia: Secondary | ICD-10-CM | POA: Diagnosis not present

## 2020-08-11 LAB — HM MAMMOGRAPHY

## 2020-08-28 ENCOUNTER — Other Ambulatory Visit: Payer: Self-pay | Admitting: Internal Medicine

## 2020-08-28 DIAGNOSIS — I10 Essential (primary) hypertension: Secondary | ICD-10-CM

## 2020-09-04 ENCOUNTER — Other Ambulatory Visit: Payer: Self-pay | Admitting: Internal Medicine

## 2020-09-04 DIAGNOSIS — I10 Essential (primary) hypertension: Secondary | ICD-10-CM

## 2020-09-05 ENCOUNTER — Encounter: Payer: Self-pay | Admitting: *Deleted

## 2020-09-06 ENCOUNTER — Other Ambulatory Visit: Payer: Self-pay | Admitting: Internal Medicine

## 2020-09-06 ENCOUNTER — Ambulatory Visit (INDEPENDENT_AMBULATORY_CARE_PROVIDER_SITE_OTHER): Payer: 59 | Admitting: Neurology

## 2020-09-06 DIAGNOSIS — Z7722 Contact with and (suspected) exposure to environmental tobacco smoke (acute) (chronic): Secondary | ICD-10-CM

## 2020-09-06 DIAGNOSIS — F324 Major depressive disorder, single episode, in partial remission: Secondary | ICD-10-CM

## 2020-09-06 DIAGNOSIS — I10 Essential (primary) hypertension: Secondary | ICD-10-CM

## 2020-09-06 DIAGNOSIS — F99 Mental disorder, not otherwise specified: Secondary | ICD-10-CM

## 2020-09-06 DIAGNOSIS — G4733 Obstructive sleep apnea (adult) (pediatric): Secondary | ICD-10-CM | POA: Diagnosis not present

## 2020-09-06 DIAGNOSIS — E1122 Type 2 diabetes mellitus with diabetic chronic kidney disease: Secondary | ICD-10-CM

## 2020-09-06 DIAGNOSIS — F5105 Insomnia due to other mental disorder: Secondary | ICD-10-CM

## 2020-09-06 DIAGNOSIS — N182 Chronic kidney disease, stage 2 (mild): Secondary | ICD-10-CM

## 2020-09-13 NOTE — Progress Notes (Signed)
   [facility] (Watch PAT)  STUDY DATE: 09/13/20  DOB: 1956-06-19  MRN: [id number]  ORDERING CLINICIAN: Larey Seat, MD   REFERRING CLINICIAN: Unk Pinto, MD   CLINICAL INFORMATION/HISTORY: I had followed Mrs. Kristina Webb until about 5 years ago for the use of CPAP in the treatment of obstructive sleep apnea.  She received a [employer] machine at the time, and machine that is now under recall.  She also quit using her CPAP and stated that since her husband is smoking in the home her CPAP machine dispensed basically smoky air to her. She had a lot of psychosocial and financial stressors since the pandemic and in June of this year she was let go from her job for 44 years.  Apparently she received some severance package but unemployment may still be needed until she reaches full retirement date. Her blood pressure exacerbated under the stress, today however she presents mother with low blood pressures here and borderline bradycardia.  One of the concerns is that the patient is now on treated for OSA and that her risk factors for cardiac and vascular disease in general are still high.   She had acute insomnia,  chest pressure and shortness of breath when she saw Howell Rucks , PA in her primary care physician's office. That was on 14 March 2020 shortly after she received the message about her Job being made redundant. She was observed having apnea during a shoulder surgery on the right shoulder- 2019.  She has a history of diabetes type 2 non-insulin-dependent just on Metformin, history of asthma on Breo on Brio Ellipta, she has coughing spells, sometimes interrupting her sleep at night- These may very well be also related to the secondhand smoke exposure.  Epworth sleepiness score: 3/24.  BMI: 38.4 kg/m  Neck Circumference: 17 "  FINDINGS:  Total Record Time (hours, min): 9 h 25 min Total Sleep Time (hours, min):  8 h 21 min   Percent REM (%):     30.15 %   Calculated pAHI (per hour):  41.9      REM pAHI:    61.3     NREM pAHI: 33.6  Supine AHI: 42.7   Oxygen Saturation (%) Mean: 93  Minimum oxygen saturation (%):         77   O2 Saturation Range (%): 77-98  O2Saturation (minutes) <=88%: 9.5 min   Pulse Mean (bpm):    80  Pulse Range (44-108)   IMPRESSION: this HST confirmed the presence of SEVERE OSA (obstructive sleep apnea at AHI of 42/h and exacerbated by REM sleep to 61/h. Nadir S pO2 was 77 % and her Oxygen levels dropped briefly.   RECOMMENDATION: This degree of apnea needs continued CPAP therapy and I will order an autotitration  capable CPAP machine with the settings of 5-15 cm water, heated humidification , 2 cm EPR and her preferred mask.     INTERPRETING PHYSICIAN:  Larey Seat, MD  [facility] and [facility] Sleep Board certified by The [employer] of Sleep Medicine and  Fellow of the [employer] of Neurology. Medical Director of [employer].

## 2020-09-18 DIAGNOSIS — F99 Mental disorder, not otherwise specified: Secondary | ICD-10-CM | POA: Insufficient documentation

## 2020-09-18 DIAGNOSIS — G4733 Obstructive sleep apnea (adult) (pediatric): Secondary | ICD-10-CM | POA: Insufficient documentation

## 2020-09-18 DIAGNOSIS — F5105 Insomnia due to other mental disorder: Secondary | ICD-10-CM | POA: Insufficient documentation

## 2020-09-18 DIAGNOSIS — Z7722 Contact with and (suspected) exposure to environmental tobacco smoke (acute) (chronic): Secondary | ICD-10-CM | POA: Insufficient documentation

## 2020-09-18 NOTE — Addendum Note (Signed)
Addended by: Larey Seat on: 09/18/2020 12:27 PM   Modules accepted: Orders

## 2020-09-18 NOTE — Progress Notes (Signed)
IMPRESSION: This HST confirmed the presence of SEVERE OSA (obstructive sleep apnea at AHI of 42/h and exacerbated by REM sleep to 61/h. No positional exacerbation of AHI was seen. Nadir SpO2 was 77 % and her Oxygen levels dropped briefly.   RECOMMENDATION: This degree of apnea needs continued CPAP therapy and I will order an autotitration  capable CPAP machine with the settings of 5-15 cm water, heated humidification , 2 cm EPR and her preferred mask.

## 2020-09-18 NOTE — Procedures (Signed)
HOME SLEEP TEST (Watch PAT)  STUDY DATE: 09/13/20  DOB: [birth year]  MRN: [id number]  ORDERING CLINICIAN: Larey Seat, MD   REFERRING CLINICIAN: Unk Pinto, MD   CLINICAL INFORMATION/HISTORY: I had followed Mrs. Kristina Webb until about 5 years ago for the use of CPAP in the treatment of obstructive sleep apnea.  She received a [employer] machine at the time, and machine that is now under recall.  She also quit using her CPAP and stated that since her husband is smoking in the home her CPAP machine dispensed basically smoky air to her. She had a lot of psychosocial and financial stressors since the pandemic and in June of this year she was let go from her job for 44 years.  Apparently she received some severance package but unemployment may still be needed until she reaches full retirement date. Her blood pressure exacerbated under the stress, today however she presents mother with low blood pressures here and borderline bradycardia.  One of the concerns is that the patient is now on treated for OSA and that her risk factors for cardiac and vascular disease in general are still high.   She had acute insomnia,  chest pressure and shortness of breath when she saw Howell Rucks , PA in her primary care physician's office. That was on 14 March 2020 shortly after she received the message about her Job being made redundant. She was observed having apnea during a shoulder surgery on the right shoulder- 2019.  She has a history of diabetes type 2 non-insulin-dependent just on Metformin, history of asthma on Breo on Brio Ellipta, she has coughing spells, sometimes interrupting her sleep at night- These may very well be also related to the secondhand smoke exposure.  Epworth sleepiness score: 3/24.  BMI: 38.4 kg/m  Neck Circumference: 17 "  FINDINGS:  Total Record Time (hours, min): 9 h 25 min Total Sleep Time (hours, min):  8 h 21 min   Percent REM (%):    30.15 %   Calculated pAHI (per  hour):  41.9      REM pAHI:    61.3     NREM pAHI: 33.6  Supine AHI: 42.7   Oxygen Saturation (%) Mean: 93  Minimum oxygen saturation (%):         77   O2 Saturation Range (%): 77-98  O2Saturation (minutes) <=88%: 9.5 min   Pulse Mean (bpm):    80  Pulse Range (44-108)   IMPRESSION: this HST confirmed the presence of SEVERE OSA (obstructive sleep apnea at AHI of 42/h and exacerbated by REM sleep to 61/h. Nadir S pO2 was 77 % and her Oxygen levels dropped briefly.   RECOMMENDATION: This degree of apnea needs continued CPAP therapy and I will order an autotitration  capable CPAP machine with the settings of 5-15 cm water, heated humidification , 2 cm EPR and her preferred mask.     INTERPRETING PHYSICIAN:  Larey Seat, MD  [facility] and [facility] Sleep Board certified by The [employer] of Sleep Medicine and  Fellow of the [employer] of Neurology. Medical Director of [employer].

## 2020-09-19 ENCOUNTER — Telehealth: Payer: Self-pay | Admitting: Neurology

## 2020-09-19 ENCOUNTER — Encounter: Payer: Self-pay | Admitting: Neurology

## 2020-09-19 NOTE — Telephone Encounter (Signed)
I called pt. I advised pt that Dr. Brett Fairy reviewed their sleep study results and found that pt has severe sleep apnea. Dr. Brett Fairy recommends that pt starts auto CPAP. I reviewed PAP compliance expectations with the pt. Pt is agreeable to starting a CPAP. I advised pt that an order will be sent to a DME, [facility] and [facility] will call the pt within about one week after they file with the pt's insurance. [facility]) will show the pt how to use the machine, fit for masks, and troubleshoot the CPAP if needed. A follow up appt will need to be  made for insurance purposes with Dr. Brett Fairy or the NP. A letter with all of this information in it will be sent to the pt as a reminder. Pt verbalized understanding of results. Pt had no questions at this time but was encouraged to call back if questions arise. I have sent the order to [facility]) and have received confirmation that they have received the order.

## 2020-09-19 NOTE — Telephone Encounter (Signed)
-----   Message from Larey Seat, MD sent at 09/18/2020 12:27 PM EST ----- IMPRESSION: This HST confirmed the presence of SEVERE OSA (obstructive sleep apnea at AHI of 42/h and exacerbated by REM sleep to 61/h. No positional exacerbation of AHI was seen. Nadir SpO2 was 77 % and her Oxygen levels dropped briefly.   RECOMMENDATION: This degree of apnea needs continued CPAP therapy and I will order an autotitration  capable CPAP machine with the settings of 5-15 cm water, heated humidification , 2 cm EPR and her preferred mask.

## 2020-09-20 ENCOUNTER — Encounter: Payer: Self-pay | Admitting: Internal Medicine

## 2020-09-20 ENCOUNTER — Other Ambulatory Visit: Payer: Self-pay | Admitting: Adult Health

## 2020-09-20 DIAGNOSIS — E1122 Type 2 diabetes mellitus with diabetic chronic kidney disease: Secondary | ICD-10-CM

## 2020-09-20 DIAGNOSIS — N182 Chronic kidney disease, stage 2 (mild): Secondary | ICD-10-CM

## 2020-10-02 ENCOUNTER — Other Ambulatory Visit: Payer: Self-pay | Admitting: Internal Medicine

## 2020-10-02 DIAGNOSIS — I10 Essential (primary) hypertension: Secondary | ICD-10-CM

## 2020-10-02 MED ORDER — AMLODIPINE BESYLATE 10 MG PO TABS: ORAL_TABLET | ORAL | 0 refills | Status: DC

## 2020-10-17 LAB — HM DIABETES EYE EXAM

## 2020-10-19 ENCOUNTER — Encounter: Payer: Self-pay | Admitting: *Deleted

## 2020-10-19 ENCOUNTER — Ambulatory Visit: Payer: BC Managed Care – PPO | Admitting: Adult Health

## 2020-10-22 ENCOUNTER — Other Ambulatory Visit: Payer: Self-pay | Admitting: Internal Medicine

## 2020-10-22 DIAGNOSIS — I10 Essential (primary) hypertension: Secondary | ICD-10-CM

## 2020-10-24 ENCOUNTER — Other Ambulatory Visit: Payer: Self-pay | Admitting: Internal Medicine

## 2020-10-24 ENCOUNTER — Telehealth: Payer: Self-pay | Admitting: *Deleted

## 2020-10-24 DIAGNOSIS — Z20818 Contact with and (suspected) exposure to other bacterial communicable diseases: Secondary | ICD-10-CM

## 2020-10-24 NOTE — Telephone Encounter (Signed)
Returned call to patient regarding possible MRSA exposure, due to spouse testing positive. Per Dr Melford Aase, the patient was advised to come for a NV and nasal swab to test her for MRSA.

## 2020-10-25 ENCOUNTER — Other Ambulatory Visit: Payer: Self-pay

## 2020-10-25 ENCOUNTER — Ambulatory Visit (INDEPENDENT_AMBULATORY_CARE_PROVIDER_SITE_OTHER): Payer: 59

## 2020-10-25 ENCOUNTER — Ambulatory Visit: Payer: Self-pay | Admitting: Adult Health

## 2020-10-25 DIAGNOSIS — Z20818 Contact with and (suspected) exposure to other bacterial communicable diseases: Secondary | ICD-10-CM

## 2020-10-25 NOTE — Progress Notes (Signed)
Patient presents to the office for a nurse visit. Patient's husband recently tested positive for MRSA. No signs or symptoms. Vitals taken and recorded.

## 2020-10-26 ENCOUNTER — Ambulatory Visit: Payer: Self-pay | Admitting: Adult Health

## 2020-10-28 LAB — NASAL CULTURE (N/P)
MICRO NUMBER:: 11601456
RESULT:: NORMAL
SPECIMEN QUALITY:: ADEQUATE

## 2020-10-28 NOTE — Progress Notes (Signed)
============================================================ ============================================================  -    Nasal culture - NEGATIVE  for MRSA  ============================================================ ============================================================

## 2020-11-02 ENCOUNTER — Ambulatory Visit (INDEPENDENT_AMBULATORY_CARE_PROVIDER_SITE_OTHER): Payer: 59 | Admitting: Adult Health Nurse Practitioner

## 2020-11-02 ENCOUNTER — Other Ambulatory Visit: Payer: Self-pay

## 2020-11-02 ENCOUNTER — Encounter: Payer: Self-pay | Admitting: Adult Health Nurse Practitioner

## 2020-11-02 VITALS — BP 126/80 | HR 57 | Temp 96.8°F | Wt 219.0 lb

## 2020-11-02 DIAGNOSIS — E1122 Type 2 diabetes mellitus with diabetic chronic kidney disease: Secondary | ICD-10-CM

## 2020-11-02 DIAGNOSIS — F324 Major depressive disorder, single episode, in partial remission: Secondary | ICD-10-CM

## 2020-11-02 DIAGNOSIS — E785 Hyperlipidemia, unspecified: Secondary | ICD-10-CM

## 2020-11-02 DIAGNOSIS — Q858 Other phakomatoses, not elsewhere classified: Secondary | ICD-10-CM

## 2020-11-02 DIAGNOSIS — E1169 Type 2 diabetes mellitus with other specified complication: Secondary | ICD-10-CM | POA: Diagnosis not present

## 2020-11-02 DIAGNOSIS — N182 Chronic kidney disease, stage 2 (mild): Secondary | ICD-10-CM

## 2020-11-02 DIAGNOSIS — Z79899 Other long term (current) drug therapy: Secondary | ICD-10-CM

## 2020-11-02 DIAGNOSIS — K2100 Gastro-esophageal reflux disease with esophagitis, without bleeding: Secondary | ICD-10-CM | POA: Diagnosis not present

## 2020-11-02 DIAGNOSIS — Q8589 Other phakomatoses, not elsewhere classified: Secondary | ICD-10-CM

## 2020-11-02 DIAGNOSIS — I10 Essential (primary) hypertension: Secondary | ICD-10-CM | POA: Diagnosis not present

## 2020-11-02 DIAGNOSIS — Z6838 Body mass index (BMI) 38.0-38.9, adult: Secondary | ICD-10-CM

## 2020-11-02 DIAGNOSIS — G4733 Obstructive sleep apnea (adult) (pediatric): Secondary | ICD-10-CM

## 2020-11-02 DIAGNOSIS — E039 Hypothyroidism, unspecified: Secondary | ICD-10-CM

## 2020-11-02 NOTE — Progress Notes (Signed)
FOLLOW UP 3 MONTH  Assessment and Plan:  Kristina Webb was seen today for annual exam.  Diagnoses and all orders for this visit:   Essential hypertension Continue current regimen: Bisoprolol 10mg , amlodipine 10mg , Furosemide 40mg  every am, olmesartan 40mg . -     CBC with Differential/Platelet -     COMPLETE METABOLIC PANEL WITH GFR -     Magnesium -     EKG 12-Lead -     Microalbumin / creatinine urine ratio  Mixed hyperlipidemia associated with DMII Continue medications: rosuvastatin 10mg  Discussed dietary and exercise modifications Low fat diet -     Lipid panel  Gastroesophageal reflux disease with esophagitis Doing well on current regimen metformin 500mg  two tablets BID Pepcid and protonix Monitor for triggers -     Magnesium  Type 2 diabetes mellitus with stage 2 chronic kidney disease, without long-term current use of insulin (HCC) Continue Metformin500mg  BID -     TSH -     Hemoglobin A1c -     HM DIABETES FOOT EXAM -     Microalbumin / creatinine urine ratio   CKD stage 2 due to type 2 diabetes mellitus (HCC) Increase fluids  Avoid NSAIDS Blood pressure control Monitor sugars  Will continue to monitor -     COMPLETE METABOLIC PANEL WITH GFR  Depression, recurrent in partial remission Doing well on current regimen Continue with benefit  Hypothyroidism Taking levothyroxine 75 mcg daily Reminder to take on an empty stomach 30-35mins before first meal of the day. No antacid medications for 4 hours.  Obstructive sleep apnea syndrome Has an appointment with Neuro, delayed related to COVID19 to January 28, 2019.  Will contact for follow up  Obesity, morbid ([name]) BMI 38 Discussed dietary and exercise modifications Discussed Ozemic, Saxenda? Check insurance for coverage. 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 Screening labs and tests as requested with regular follow-up as recommended. Over 30 minutes of face to face exam, counseling, chart review, and complex, high level critical decision making was performed this visit.   HPI  65 y.o. 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  She has tried topiramate 50mg  and phentermine for weight loss.  Reports she was not able to tolerate the medication and would like to discussed other options.  She had begun to increase her physical activity since the weather is improving.  Her blood pressure has been controlled  at home, today their BP is BP: 126/80 She does workout. She denies chest pain, shortness of breath, dizziness.   She is on cholesterol medication and denies myalgias. Her cholesterol is at goal. The cholesterol last visit was:   Lab Results  Component Value Date   CHOL 138 11/02/2020   HDL 50 11/02/2020   LDLCALC 61 11/02/2020   TRIG 196  (H) 11/02/2020   CHOLHDL 2.8 11/02/2020    She has been working on diet and exercise for T2DM w/ CKD3a, she is on bASA, she is on ACE/ARB and denies hyperglycemia, hypoglycemia , increased appetite, nausea, polydipsia, polyuria, visual disturbances, vomiting and weight loss. Last A1C in the office was:  Lab Results  Component Value Date   HGBA1C 6.6 (H) 11/02/2020   She is on thyroid medication. Her medication was not changed last visit.  She is taking levothyroxine 85mcg daily. Lab Results  Component Value Date   TSH 3.94 11/02/2020  .   Last GFR: Lab Results  Component Value Date   GFRNONAA 45 (L) 11/02/2020   Lab Results  Component Value Date   GFRAA 52 (L) 11/02/2020    Patient is on Vitamin D supplement.   Lab Results  Component Value Date   VD25OH 84 07/18/2020      Current Medications:  Current Outpatient Medications on File Prior to Visit  Medication Sig Dispense Refill  . acetaminophen (TYLENOL) 500 MG tablet Take 1,000 mg by mouth every 6 (six) hours as needed for mild pain, moderate pain or headache.    . albuterol (PROAIR HFA) 108 (90 Base) MCG/ACT inhaler Inhale 2 puffs into the lungs every 6 (six) hours as needed for wheezing. 18 g 1  . amitriptyline (ELAVIL) 10 MG tablet Take 1 tablet (10 mg total) by mouth at bedtime as needed.    Marland Kitchen amLODipine (NORVASC) 10 MG tablet Take  1 tablet  Daily  for BP 90 tablet 0  . bisoprolol (ZEBETA) 10 MG tablet TAKE 1 TABLET DAILY FOR BLOOD PRESSURE 90 tablet 3  . Blood Glucose Monitoring Suppl DEVI Use to test blood sugar once daily. Dx: E11.22, N18.2 1 Device 0  . fluticasone furoate-vilanterol (BREO ELLIPTA) 100-25 MCG/INH AEPB Inhale 1 puff into the lungs daily. Rinse mouth after use. 1 each 2  . FREESTYLE LITE test strip USE TO CHECK BLOOD SUGAR ONCE DAILY. DX: E11.22, N18.2 100 strip 2  . furosemide (LASIX) 40 MG tablet TAKE 1 TABLET EVERY MORNING FOR BLOOD PRESSURE AND FLUID 90 tablet 1  . Lancets MISC Use to check  blood sugar once daily. Dx: E11.22, N18.2 100 each 2  . levothyroxine (SYNTHROID) 75 MCG tablet TAKE 1 TABLET BY MOUTH EVERY DAY ON EMPTY STOMACH WITH WATER. NO ANTACID MEDS FOR 30 MINS 90 tablet 3  . metFORMIN (GLUCOPHAGE-XR) 500 MG 24 hr tablet Take 1 tablet (500 mg total) by mouth 2 (two) times daily. 180 tablet 2  . montelukast (SINGULAIR) 10 MG tablet Take 1 tablet Daily for Allergies 90 tablet 0  . olmesartan (BENICAR) 40 MG tablet TAKE 1 TABLET DAILY FOR BP & DIABETIC KIDNEY PROTECTION 90 tablet 1  . pantoprazole (PROTONIX) 40 MG tablet TAKE 1 TABLET DAILY FOR HEARTBURN & INDIGESTION 90 tablet 3  . rosuvastatin (CRESTOR) 10 MG tablet Take     1 tablet    3 x /week       on Mon Wed Fri 39 tablet 0  . sertraline (ZOLOFT) 100 MG tablet TAKE 1 OR 2  TABLETS BY MOUTH DAILY FOR ANXIETY 180 tablet 1  . Vitamin D, Ergocalciferol, (DRISDOL) 1.25 MG (50000 UNIT) CAPS capsule TAKE 1 CAPSULE BY MOUTH 2 DAYS A WEEK 24 capsule 2   No current facility-administered medications on file prior to visit.   Allergies:  Allergies  Allergen Reactions  . Levaquin [Levofloxacin In D5w] Other (See Comments)    Ankles hurt really bad ? ACHILLES TENDON ?  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   Health Maintenance:   Immunization History  Administered Date(s) Administered  . Influenza Inj Mdck Quad With Preservative 06/14/2020  . Influenza Split 04/26/2013, 05/29/2015  . Influenza Whole 05/26/2012  . Influenza-Unspecified 05/20/2017, 05/19/2018, 05/21/2019  . PFIZER(Purple Top)SARS-COV-2 Vaccination 10/22/2019, 11/26/2019, 05/30/2020  . Pneumococcal Polysaccharide-23 09/10/2016  . Td 08/27/2007, 10/22/2017    Tetanus: 2019 Pneumovax: 2018 Prevnar 16: Due at age 6 Flu vaccine: 2019 Zostavax: Had shingles at age 79, discussed vaccination with patient.   Kristina Webb x2- Complete 11/26/19 Pap: 02/2020?, Due Dr Perlie Gold MGM: 12/2017, Scheduled for 2021 DEXA: Due at age 70 Colonoscopy: 09/1017  EKG: 01/2020 - NSR CXR: 07/2018  Last Dental Exam: Due for 2020 Last Eye Exam:08/2018  Patient Care Team: Unk Pinto, MD as PCP - General (Internal Medicine) Buford Dresser, MD as PCP - Cardiology (Cardiology) Newt Minion, MD as Consulting Physician (Orthopedic Surgery) Love, Alyson Locket, MD as Consulting Physician (Neurology) Ralene Bathe, MD as Consulting Physician (Ophthalmology) Gastroenterology, Sadie Haber as Consulting Physician (Gastroenterology)  Surgical History:  She has a past surgical history that includes Back surgery; Ankle ganglion cyst excision (Left); birth mark treatments; Breast lumpectomy with radioactive seed localization (Left, 02/02/2016); Breast surgery (Right); Colon surgery (2008); Cholecystectomy (N/A, 02/20/2017); and Cataract extraction (Right, 12/2016). Family History:  Herfamily history includes Allergies in her mother; Asthma in her mother; Brain cancer in her father; Breast cancer in her mother; Colon cancer in her paternal grandmother; Emphysema in her mother; Heart disease in her mother; Hypertension in her brother; Prostate cancer in her father. Social History:  She reports that she has never smoked. She has never  used smokeless tobacco. She reports that she does not drink alcohol and does not use drugs.  Review of Systems: Review of Systems  Constitutional: Negative for chills, diaphoresis, fever, malaise/fatigue and weight loss.  HENT: Negative for congestion, ear discharge, ear pain, hearing loss, nosebleeds, sinus pain, sore throat and tinnitus.   Eyes: Negative for blurred vision, double vision, photophobia, pain, discharge and redness.  Respiratory: Negative for cough, hemoptysis, sputum production, shortness of breath, wheezing and stridor.   Cardiovascular: Negative for chest pain, palpitations, orthopnea, claudication, leg swelling and PND.  Gastrointestinal: Negative for abdominal pain, blood in stool, constipation, diarrhea, heartburn, melena, nausea and vomiting.  Genitourinary: Negative for dysuria, flank pain, frequency, hematuria and urgency.  Musculoskeletal: Negative for back  pain, falls, joint pain, myalgias and neck pain.  Skin: Negative for itching and rash.  Neurological: Negative for dizziness, tingling, tremors, sensory change, speech change, focal weakness, seizures, loss of consciousness, weakness and headaches.  Endo/Heme/Allergies: Negative for environmental allergies and polydipsia. Does not bruise/bleed easily.  Psychiatric/Behavioral: Negative for depression, hallucinations, memory loss, substance abuse and suicidal ideas. The patient is not nervous/anxious and does not have insomnia.     Physical Exam: Estimated body mass index is 38.19 kg/m as calculated from the following:   Height as of 08/09/20: 5' 3.5" (1.613 m).   Weight as of this encounter: 219 lb (99.3 kg). BP 126/80   Pulse (!) 57   Temp (!) 96.8 F (36 C)   Wt 219 lb (99.3 kg)   SpO2 97%   BMI 38.19 kg/m   General Appearance: Well nourished, in no apparent distress.  Eyes: PERRLA, EOMs, conjunctiva no swelling or erythema, normal fundi and vessels.  Sinuses: No Frontal/maxillary tenderness   ENT/Mouth: Ext aud canals clear, normal light reflex with TMs without erythema, bulging. Good dentition. No erythema, swelling, or exudate on post pharynx. Tonsils not swollen or erythematous. Hearing normal.  Neck: Supple, thyroid normal. No bruits  Respiratory: Respiratory effort normal, BS equal bilaterally without rales, rhonchi, wheezing or stridor.  Cardio: RRR without murmurs, rubs or gallops. Brisk peripheral pulses without edema.  Chest: symmetric, with normal excursions and percussion.  Abdomen: Soft obese, nontender, no guarding, rebound, hernias, masses, or organomegaly.  Lymphatics: Non tender without lymphadenopathy.  Musculoskeletal: Full ROM all peripheral extremities,5/5 strength, and normal gait.  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  Warm, dry without rashes, lesions, ecchymosis. Neuro: Cranial nerves intact, reflexes equal bilaterally. Normal muscle tone, no cerebellar symptoms. Sensation intact.  Psych: Awake and oriented X 3, normal affect, Insight and Judgment appropriate.    Garnet Sierras, Laqueta Jean, DNP [facility] 11/02/2020  4:17 PM

## 2020-11-03 LAB — COMPLETE METABOLIC PANEL WITH GFR
AG Ratio: 1.6 (calc) (ref 1.0–2.5)
ALT: 25 U/L (ref 6–29)
AST: 23 U/L (ref 10–35)
Albumin: 4.4 g/dL (ref 3.6–5.1)
Alkaline phosphatase (APISO): 65 U/L (ref 37–153)
BUN/Creatinine Ratio: 15 (calc) (ref 6–22)
BUN: 19 mg/dL (ref 7–25)
CO2: 27 mmol/L (ref 20–32)
Calcium: 9.7 mg/dL (ref 8.6–10.4)
Chloride: 104 mmol/L (ref 98–110)
Creat: 1.27 mg/dL — ABNORMAL HIGH (ref 0.50–0.99)
GFR, Est African American: 52 mL/min/{1.73_m2} — ABNORMAL LOW (ref >=60)
GFR, Est Non African American: 45 mL/min/{1.73_m2} — ABNORMAL LOW (ref >=60)
Globulin: 2.8 g/dL (calc) (ref 1.9–3.7)
Glucose, Bld: 102 mg/dL — ABNORMAL HIGH (ref 65–99)
Potassium: 4.4 mmol/L (ref 3.5–5.3)
Sodium: 142 mmol/L (ref 135–146)
Total Bilirubin: 0.3 mg/dL (ref 0.2–1.2)
Total Protein: 7.2 g/dL (ref 6.1–8.1)

## 2020-11-03 LAB — CBC WITH DIFFERENTIAL/PLATELET
Absolute Monocytes: 601 cells/uL (ref 200–950)
Basophils Absolute: 58 cells/uL (ref 0–200)
Basophils Relative: 0.6 %
Eosinophils Absolute: 378 cells/uL (ref 15–500)
Eosinophils Relative: 3.9 %
HCT: 38.6 % (ref 35.0–45.0)
Hemoglobin: 12.4 g/dL (ref 11.7–15.5)
Lymphs Abs: 2900 cells/uL (ref 850–3900)
MCH: 25.9 pg — ABNORMAL LOW (ref 27.0–33.0)
MCHC: 32.1 g/dL (ref 32.0–36.0)
MCV: 80.8 fL (ref 80.0–100.0)
MPV: 10.4 fL (ref 7.5–12.5)
Monocytes Relative: 6.2 %
Neutro Abs: 5762 cells/uL (ref 1500–7800)
Neutrophils Relative %: 59.4 %
Platelets: 318 10*3/uL (ref 140–400)
RBC: 4.78 10*6/uL (ref 3.80–5.10)
RDW: 14.5 % (ref 11.0–15.0)
Total Lymphocyte: 29.9 %
WBC: 9.7 10*3/uL (ref 3.8–10.8)

## 2020-11-03 LAB — LIPID PANEL
Cholesterol: 138 mg/dL (ref <200)
HDL: 50 mg/dL (ref >=50)
LDL Cholesterol (Calc): 61 mg/dL (calc)
Non-HDL Cholesterol (Calc): 88 mg/dL (calc) (ref <130)
Total CHOL/HDL Ratio: 2.8 (calc) (ref <5.0)
Triglycerides: 196 mg/dL — ABNORMAL HIGH (ref <150)

## 2020-11-03 LAB — TSH: TSH: 3.94 mIU/L (ref 0.40–4.50)

## 2020-11-03 LAB — HEMOGLOBIN A1C
Hgb A1c MFr Bld: 6.6 % of total Hgb — ABNORMAL HIGH (ref <5.7)
Mean Plasma Glucose: 143 mg/dL
eAG (mmol/L): 7.9 mmol/L

## 2020-11-08 ENCOUNTER — Telehealth: Payer: Self-pay | Admitting: Neurology

## 2020-11-08 NOTE — Telephone Encounter (Signed)
Initial cpap f/u scheduled for 01/18/21.

## 2020-11-11 ENCOUNTER — Other Ambulatory Visit: Payer: Self-pay | Admitting: Adult Health Nurse Practitioner

## 2020-11-12 DIAGNOSIS — N182 Chronic kidney disease, stage 2 (mild): Secondary | ICD-10-CM

## 2020-11-12 DIAGNOSIS — E1122 Type 2 diabetes mellitus with diabetic chronic kidney disease: Secondary | ICD-10-CM

## 2020-11-16 ENCOUNTER — Other Ambulatory Visit: Payer: Self-pay | Admitting: Adult Health Nurse Practitioner

## 2020-11-16 DIAGNOSIS — E1122 Type 2 diabetes mellitus with diabetic chronic kidney disease: Secondary | ICD-10-CM

## 2020-11-16 DIAGNOSIS — N182 Chronic kidney disease, stage 2 (mild): Secondary | ICD-10-CM

## 2020-11-16 MED ORDER — OZEMPIC (0.25 OR 0.5 MG/DOSE) 2 MG/1.5ML ~~LOC~~ SOPN: PEN_INJECTOR | SUBCUTANEOUS | 1 refills | Status: DC

## 2020-11-16 NOTE — Progress Notes (Signed)
Sent in order for Ozempic for patient to start 0.25mg  injeect 0.25mg  for four weeks.  Consider increase to 0.5mg  if tolerates medication. Will schedule follow up if medication is approved.   Garnet Sierras, Laqueta Jean, DNP [facility] 11/16/2020  1:11 PM

## 2020-11-20 MED ORDER — TRULICITY 0.75 MG/0.5ML ~~LOC~~ SOAJ: 0.7500 mg | SUBCUTANEOUS | 0 refills | Status: DC

## 2020-11-20 NOTE — Addendum Note (Signed)
Addended byGarnet Sierras A on: 11/20/2020 01:07 PM   Modules accepted: Orders

## 2020-12-16 ENCOUNTER — Other Ambulatory Visit: Payer: Self-pay | Admitting: Internal Medicine

## 2020-12-16 ENCOUNTER — Other Ambulatory Visit: Payer: Self-pay | Admitting: Adult Health Nurse Practitioner

## 2020-12-16 ENCOUNTER — Other Ambulatory Visit: Payer: Self-pay | Admitting: Adult Health

## 2020-12-16 DIAGNOSIS — J4541 Moderate persistent asthma with (acute) exacerbation: Secondary | ICD-10-CM

## 2020-12-16 DIAGNOSIS — R059 Cough, unspecified: Secondary | ICD-10-CM

## 2020-12-16 DIAGNOSIS — I10 Essential (primary) hypertension: Secondary | ICD-10-CM

## 2020-12-16 MED ORDER — MONTELUKAST SODIUM 10 MG PO TABS: ORAL_TABLET | ORAL | 3 refills | Status: DC

## 2020-12-21 ENCOUNTER — Other Ambulatory Visit: Payer: Self-pay | Admitting: Radiology

## 2020-12-21 DIAGNOSIS — N6452 Nipple discharge: Secondary | ICD-10-CM

## 2020-12-25 ENCOUNTER — Other Ambulatory Visit: Payer: Self-pay | Admitting: Internal Medicine

## 2020-12-25 MED ORDER — AZITHROMYCIN 250 MG PO TABS: ORAL_TABLET | ORAL | 1 refills | Status: DC

## 2020-12-26 ENCOUNTER — Other Ambulatory Visit: Payer: Self-pay | Admitting: Internal Medicine

## 2020-12-26 ENCOUNTER — Other Ambulatory Visit: Payer: Self-pay | Admitting: *Deleted

## 2020-12-26 DIAGNOSIS — I10 Essential (primary) hypertension: Secondary | ICD-10-CM

## 2020-12-26 MED ORDER — LEVOTHYROXINE SODIUM 75 MCG PO TABS: ORAL_TABLET | ORAL | 3 refills | Status: DC

## 2020-12-28 ENCOUNTER — Encounter: Payer: Self-pay | Admitting: Internal Medicine

## 2021-01-01 ENCOUNTER — Ambulatory Visit (INDEPENDENT_AMBULATORY_CARE_PROVIDER_SITE_OTHER): Payer: 59 | Admitting: Internal Medicine

## 2021-01-01 ENCOUNTER — Other Ambulatory Visit: Payer: Self-pay

## 2021-01-01 ENCOUNTER — Encounter: Payer: Self-pay | Admitting: Internal Medicine

## 2021-01-01 VITALS — BP 122/66 | HR 67 | Temp 97.5°F | Resp 16 | Ht 63.0 in | Wt 220.8 lb

## 2021-01-01 DIAGNOSIS — R059 Cough, unspecified: Secondary | ICD-10-CM | POA: Diagnosis not present

## 2021-01-01 DIAGNOSIS — J0410 Acute tracheitis without obstruction: Secondary | ICD-10-CM | POA: Diagnosis not present

## 2021-01-01 MED ORDER — PROMETHAZINE-DM 6.25-15 MG/5ML PO SYRP: ORAL_SOLUTION | ORAL | 1 refills | Status: DC

## 2021-01-01 MED ORDER — DEXAMETHASONE 4 MG PO TABS: ORAL_TABLET | ORAL | 0 refills | Status: DC

## 2021-01-01 MED ORDER — BENZONATATE 200 MG PO CAPS: 200.0000 mg | ORAL_CAPSULE | Freq: Three times a day (TID) | ORAL | 1 refills | Status: DC | PRN

## 2021-01-01 NOTE — Progress Notes (Signed)
Future Appointments  Date Time Provider [place]  01/06/2021  2:10 PM GI-315 MR 1 [id number] GI-[address] [facility]  01/18/2021  9:30 AM Dohmeier, Asencion Partridge, MD [facility] None  01/25/2021  3:00 PM Liane Comber, NP [facility] None    History of Present Illness:    Patient is a very nice 65 yo MWF with HTN, HLD, T2_NIDDM ,Vitamin D Deficiency and Sturge -Weber Syndrome who was treated 1 week ago for sinusitis/bronchitis ,with a Z-Pak & Decadron taper and report some improvement with ongoing dry cough. She reports no fever for the last 3-4 days, but does have some generalized aching in her extremities. She reports glucoses have ranged in the 120's.   Medications  Current Outpatient Medications (Endocrine & Metabolic):  Marland Kitchen  Dulaglutide (TRULICITY) 2.20 UR/4.2HC SOPN, Inject 0.75 mg into the skin once a week. .  levothyroxine (SYNTHROID) 75 MCG tablet, TAKE 1 TABLET BY MOUTH EVERY DAY ON EMPTY STOMACH WITH WATER. NO ANTACID MEDS FOR 30 MINS .  metFORMIN (GLUCOPHAGE-XR) 500 MG 24 hr tablet, Take 1 tablet (500 mg total) by mouth 2 (two) times daily.  Current Outpatient Medications (Cardiovascular):  .  amLODipine (NORVASC) 10 MG tablet, TAKE 1 TABLET DAILY  .  bisoprolol  10 MG tablet, TAKE 1 TABLET DAILY  .  furosemide 40 MG tablet, TAKE 1 TABLET EVERY MORNING  .  olmesartan 40 MG tablet, TAKE 1 TABLET DAILY  .  rosuvastatin 10 MG tablet, Take 1 tablet 3 x /week  on Mon Wed Fri .  albuterol HFA  inhaler, Inhale 2 puffs every 6  hours as needed for wheezing. Marland Kitchen  BREO ELLIPTA 100-25 , Inhale 1 puff  daily .  montelukast  10 MG tablet, Take  1 tablet  Daily  .  acetaminophen , Take 1,000 mg  every 6  hours as needed for mild pain, moderate pain or headache. Marland Kitchen  amitriptyline  10 MG tablet, Take 1 tablet at bedtime as needed. .  pantoprazole  40 MG tablet, TAKE 1 TABLET DAILY  .  sertraline  100 MG tablet, TAKE 1 OR 2 TABLETS  DAILY  .  Vitamin D 50,000 u , TAKE 1 CAPSULE  2 DAYS A WEEK  Problem  list She has Cough; Hypertension; GERD (gastroesophageal reflux disease); Anemia; Asthma; Major depression in partial remission ([place]); OSA (obstructive sleep apnea); Hyperlipidemia associated with type 2 diabetes mellitus (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   Observations/Objective:  BP 122/66   Pulse 67   Temp (!) 97.5 F (36.4 C)   Resp 16   Ht 5\' 3"  (1.6 m)   Wt 220 lb 12.8 oz (100.2 kg)   SpO2 97%   BMI 39.11 kg/m   Dry cough   HEENT - WNL. Neck - supple.  Chest - Clear equal BS. Few scattered rales clear w/cough. Cor - Nl HS. RRR w/o sig MGR. PP 1(+). No edema. MS- FROM w/o deformities.  Gait Nl. Neuro -  Nl w/o focal abnormalities.   Assessment and Plan:   1. Tracheitis  2. Cough  - benzonatate  200 MG ; Take  1 capsule  3 x /day  to Prevent Cough  Dispense: 30 capsule; Refill: 1  - dexamethasone  4 MG tablet; Take 1 tab 3 x day - 3 days, then 2 x day - 3 days, then 1 tab daily  Dispense: 20 tablets  - promethazine-DM  syrup; Take 1 teaspoonful every 4 hrs as needed for cough  Disp: 240 mL; Rf: 1  Follow Up Instructions:      I discussed the assessment and treatment plan with the patient. The patient was provided an opportunity to ask questions and all were answered. The patient agreed with the plan and demonstrated an understanding of the instructions.        The patient was advised to call back or seek an in-person evaluation if the symptoms worsen or if the condition fails to improve as anticipated.    Kirtland Bouchard, MD

## 2021-01-06 ENCOUNTER — Ambulatory Visit
Admission: RE | Admit: 2021-01-06 | Discharge: 2021-01-06 | Disposition: A | Discharge status: Home / Self Care | Payer: BC Managed Care – PPO | Source: Ambulatory Visit | Attending: Radiology | Admitting: Radiology

## 2021-01-06 ENCOUNTER — Other Ambulatory Visit: Payer: Self-pay

## 2021-01-06 DIAGNOSIS — N6452 Nipple discharge: Secondary | ICD-10-CM

## 2021-01-06 MED ORDER — GADOBUTROL 1 MMOL/ML IV SOLN
10.0000 mL | Freq: Once | INTRAVENOUS | Status: AC | PRN
  Administered 2021-01-06: 10 mL via INTRAVENOUS

## 2021-01-12 ENCOUNTER — Other Ambulatory Visit: Payer: Self-pay | Admitting: Radiology

## 2021-01-12 DIAGNOSIS — N6452 Nipple discharge: Secondary | ICD-10-CM

## 2021-01-16 ENCOUNTER — Other Ambulatory Visit: Payer: Self-pay

## 2021-01-16 ENCOUNTER — Ambulatory Visit
Admission: RE | Admit: 2021-01-16 | Discharge: 2021-01-16 | Disposition: A | Discharge status: Home / Self Care | Payer: 59 | Source: Ambulatory Visit | Attending: Radiology | Admitting: Radiology

## 2021-01-16 DIAGNOSIS — N6452 Nipple discharge: Secondary | ICD-10-CM

## 2021-01-18 ENCOUNTER — Other Ambulatory Visit: Payer: Self-pay

## 2021-01-18 ENCOUNTER — Ambulatory Visit (INDEPENDENT_AMBULATORY_CARE_PROVIDER_SITE_OTHER): Payer: 59 | Admitting: Neurology

## 2021-01-18 ENCOUNTER — Encounter: Payer: Self-pay | Admitting: Neurology

## 2021-01-18 VITALS — BP 118/74 | Ht 63.0 in | Wt 222.0 lb

## 2021-01-18 DIAGNOSIS — Q8589 Other phakomatoses, not elsewhere classified: Secondary | ICD-10-CM

## 2021-01-18 DIAGNOSIS — J45991 Cough variant asthma: Secondary | ICD-10-CM

## 2021-01-18 DIAGNOSIS — Q858 Other phakomatoses, not elsewhere classified: Secondary | ICD-10-CM | POA: Diagnosis not present

## 2021-01-18 DIAGNOSIS — G4733 Obstructive sleep apnea (adult) (pediatric): Secondary | ICD-10-CM | POA: Diagnosis not present

## 2021-01-18 NOTE — Patient Instructions (Signed)

## 2021-01-18 NOTE — Progress Notes (Signed)
SLEEP MEDICINE CLINIC    Provider:  Larey Seat, MD  Primary Care Physician:  Kristina Pinto, MD [address] [place] [address]     Referring Provider: Unk Webb, [place] [address] [place],  [place] [address]          Chief Complaint according to patient   Patient presents with:    . New Patient (Initial Visit)           HISTORY OF PRESENT ILLNESS:   Chief concern according to patient :   Kristina Webb is a 65  year old  Caucasian female patient is seen upon a RV  on 01/18/2021 , originally referred by PCP- Dr. Melford Aase, MD. From Kristina Webb is seen here today on 18 Jan 2021 following a recent home sleep test which was performed in September 13, 2020 it confirmed the presence of severe sleep apnea with additional REM sleep accentuation.  A positional component was not strongly seen.  The patient did have minor hypoxemia with a nadir of 77% oxygen saturation had a total desaturation time under 89% of 9.5 minutes.  So the calculated AHI was 41.9 and we recommended to treat with CPAP.  I prescribed an auto CPAP machine with a settings of 5 through 15 cm water pressure heated humidification 2 cm expiratory pressure relief and a mask of her choice and comfort.  The patient have to wait due to the current supply chain problems quite a while for her new machine which is a lunar machine.  Her data download today shows that she has used the machine for 25 of the last 30 days which is an over 80% compliance with an average use at time of 7 hours and 10 minutes.  Her overall 4-hour usage has been at 73%.  The mean or average P90 5 is 9.2 cmH2O pressure, her average leak is very low 5.8 L/min and her residual AHI is only 3.5/h which which I am very happy with.  She had to quit CPAP from [place] through 12-31-2020 due to severe bronchitis and she still has coughing spells, congestion.  Febrile infection, right lung , she tested negative for Covid in a home  test.          I had the pleasure to follow Kristina Webb until about 5 years ago and I had followed her for the use of CPAP in the treatment of obstructive sleep apnea.  She received a [employer] machine at the time, and machine that is now under recall.   She also quit using her CPAP and stated that since her husband is smoking in the home her CPAP machine dispensed basically smoky air to her.  Of course its not really something I can control from from here, however she had a lot of psychosocial stressors since the pandemic and in June of this year she was let go from her job for 44 years.  Apparently she received some severance package but unemployment may still be needed until she reaches full retirement date. Her blood pressure exacerbated under the stress, today however she presents mother with low blood pressures here and borderline bradycardia.  One of the concerns is that the patient is now on treated for OSA and that her risk factors for cardiac and vascular disease in general are still high.  She had acute insomnia,  chest pressure and shortness of breath when she saw Kristina Webb , PA in her primary care  physician's office. That was on 14 March 2020 shortly after she received the message about her drip being discontinued.   She was observed having apnea during a shoulder surgery on the right shoulder- 2019.  She has a history of diabetes type 2 non-insulin-dependent just on Metformin, history of asthma on Breo on Brio Ellipta, she has coughing spells sometimes interrupting her sleep at night.  I do think that these may very well be also related to the secondhand smoke exposure.     I have the pleasure of seeing Kristina Webb today, a right-handed Caucasian female with a possible sleep disorder.  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  Anemia, Anxiety, Arthritis, smoke induced Asthma, Depression, Deviated septum, GERD  (gastroesophageal reflux disease), Tension-Headache, History of hiatal hernia, Hyperlipemia, Hypertension, Hypothyroidism, OSA (obstructive sleep apnea), and diabetes without insulin.   The patient had the first sleep study in the year 2011 but I can't find the original report.    Social history:  Patient is newly unemployed after 44 years on the same job- and her husband is disabled.  she lives in a household with a chain smoking husband , no children. 2 cats.  Caffeine intake in form of Coffee( /) Soda( 2 cokes a day ) Tea ( /) or energy drinks. Regular exercise in form of walking 3 miles a day .   Hobbies : flower garden, croche,  colouring books.     Sleep habits are as follows: The patient's dinner" supper"  time is between 5.3- 6.30 PM. She has started to skip meals,  She feels no appetite , and this is depression related. She snacks. She lost the desire to cook. He smokes in all rooms of the house, a source of tension.  The patient goes to bed at 7-8 PM and is still awake at 2-3 AM, having 'napped"- she feels tired and watches Tv for  many hours while in bed, having RLS, anxiety, getting hot flushes at 68 degrees Fahrenheit.  Has right shoulder pain which affected her sleep position.  The preferred sleep position is supine, with the support of 3 pillows. She reports postnasal drip, coughing, choking.  Dreams are reportedly rare. Marland Kitchen   7-8 AM is the usual rise time. The patient wakes up spontaneously.  She reports not feeling refreshed or restored in AM, with symptoms such as dry mouth-,  morning headaches-, and residual fatigue. Naps are taken infrequently, lasting from 0.5 -3 hours and are not refreshing .   Review of Systems: Out of a complete 14 system review, the patient complains of only the following symptoms, and all other reviewed systems are negative.: Fatigue, sleepiness , snoring, fragmented sleep, Insomnia  Deteriorated sleep hygiene, untreated OSA, coughing, sinus drainage.     How likely are you to doze in the following situations: 0 = not likely, 1 = slight chance, 2 = moderate chance, 3 = high chance   Sitting and Reading? Watching Television? Sitting inactive in a public place (theater or meeting)? As a passenger in a car for an hour without a break? Lying down in the afternoon when circumstances permit? Sitting and talking to someone? Sitting quietly after lunch without alcohol? In a car, while stopped for a few minutes in traffic?   Total =  3/ 24 points   FSS endorsed at 55/ 63 points.   Social History   Socioeconomic History  . Marital status: Married  Spouse name: Not on file  . Number of children: 0  . Years of education: Not on file  . Highest education level: Not on file  Occupational History  . 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 Smoking status: Never Smoker  . Smokeless tobacco: Never Used  . Tobacco comment: husband smokes in the house  Vaping Use  . Vaping Use: Never used  Substance and Sexual Activity  . Alcohol use: No  . Drug use: No  . Sexual activity: Yes    Partners: Male    Birth control/protection: None, Post-menopausal  Other Topics Concern  . Not on file  Social History Narrative   Drinks about 1/2 can soda daily.   Social Determinants of Health   Financial Resource Strain: Not on file  Food Insecurity: Not on file  Transportation Needs: Not on file  Physical Activity: Not on file  Stress: Not on file  Social Connections: Not on file    Family History  Problem Relation Age of Onset  . Emphysema Mother        smoked  . Allergies Mother   . Asthma Mother   . Heart disease Mother   . Breast cancer Mother        with mets to Bone  . Prostate cancer Father   . Brain cancer Father   . Hypertension Brother   . Colon cancer Paternal Grandmother   . Allergic rhinitis Neg Hx   . Angioedema Neg Hx   . Eczema Neg Hx   . Immunodeficiency Neg Hx   . Urticaria Neg Hx     Past Medical  History:  Diagnosis Date  . Anemia    hx  . Anxiety   . Arthritis    back  . Asthma   . Depression   . Deviated septum   . GERD (gastroesophageal reflux disease)   . Headache   . History of hiatal hernia   . Hyperlipemia   . Hypertension   . Hypothyroidism   . OSA (obstructive sleep apnea)    does not use -last 6 months  . Pre-diabetes    per pt    Past Surgical History:  Procedure Laterality Date  . ANKLE GANGLION CYST EXCISION Left   . BACK SURGERY    . birth mark treatments    . BREAST LUMPECTOMY WITH RADIOACTIVE SEED LOCALIZATION Left 02/02/2016   Procedure: LEFT BREAST LUMPECTOMY WITH RADIOACTIVE SEED LOCALIZATION;  Surgeon: Autumn Messing III, MD;  Location: [facility];  Service: General;  Laterality: Left;  . BREAST SURGERY Right    papilloma removed,cyst lft  . CATARACT EXTRACTION Right 12/2016   Dr. Harlow Mares  . CHOLECYSTECTOMY N/A 02/20/2017   Procedure: LAPAROSCOPIC CHOLECYSTECTOMY WITH INTRAOPERATIVE CHOLANGIOGRAM;  Surgeon: Jovita Kussmaul, MD;  Location: [facility];  Service: General;  Laterality: N/A;  . COLON SURGERY  2008   resection- endometriosis     Current Outpatient Medications on File Prior to Visit  Medication Sig Dispense Refill  . acetaminophen (TYLENOL) 500 MG tablet Take 1,000 mg by mouth every 6 (six) hours as needed for mild pain, moderate pain or headache.    . albuterol (PROAIR HFA) 108 (90 Base) MCG/ACT inhaler Inhale 2 puffs into the lungs every 6 (six) hours as needed for wheezing. 18 g 1  . amitriptyline (ELAVIL) 10 MG tablet Take 1 tablet (10 mg total) by mouth at bedtime as needed.    Marland Kitchen amLODipine (NORVASC) 10 MG tablet TAKE 1 TABLET DAILY FOR BLOOD  PRESSURE 90 tablet 1  . benzonatate (TESSALON) 200 MG capsule Take 1 capsule (200 mg total) by mouth 3 (three) times daily as needed for cough (Max: 600mg  per day). Take  1 capsule  3 x /day  to Prevent Cough 30 capsule 1  . bisoprolol (ZEBETA) 10 MG tablet TAKE 1 TABLET DAILY FOR BLOOD PRESSURE 90 tablet 3   . Blood Glucose Monitoring Suppl DEVI Use to test blood sugar once daily. Dx: E11.22, N18.2 1 Device 0  . dexamethasone (DECADRON) 4 MG tablet Take 1 tab 3 x day - 3 days, then 2 x day - 3 days, then 1 tab daily 20 tablet 0  . fluticasone furoate-vilanterol (BREO ELLIPTA) 100-25 MCG/INH AEPB Inhale 1 puff into the lungs daily. Rinse mouth after use. 1 each 2  . FREESTYLE LITE test strip USE TO CHECK BLOOD SUGAR ONCE DAILY. DX: E11.22, N18.2 100 strip 2  . furosemide (LASIX) 40 MG tablet TAKE 1 TABLET EVERY MORNING FOR BLOOD PRESSURE AND FLUID 90 tablet 1  . Lancets MISC Use to check blood sugar once daily. Dx: E11.22, N18.2 100 each 2  . levothyroxine (SYNTHROID) 75 MCG tablet TAKE 1 TABLET BY MOUTH EVERY DAY ON EMPTY STOMACH WITH WATER. NO ANTACID MEDS FOR 30 MINS 90 tablet 3  . metFORMIN (GLUCOPHAGE-XR) 500 MG 24 hr tablet Take 1 tablet (500 mg total) by mouth 2 (two) times daily. 180 tablet 2  . metoprolol tartrate (LOPRESSOR) 50 MG tablet metoprolol tartrate 50 mg tablet    . montelukast (SINGULAIR) 10 MG tablet Take  1 tablet  Daily  for Allergies 90 tablet 3  . olmesartan (BENICAR) 40 MG tablet TAKE 1 TABLET DAILY FOR BP & DIABETIC KIDNEY PROTECTION 90 tablet 1  . pantoprazole (PROTONIX) 40 MG tablet TAKE 1 TABLET DAILY FOR HEARTBURN & INDIGESTION 90 tablet 3  . promethazine-dextromethorphan (PROMETHAZINE-DM) 6.25-15 MG/5ML syrup Take 1 teaspoonful every 4 hours as needed for cough 240 mL 1  . rosuvastatin (CRESTOR) 10 MG tablet Take     1 tablet    3 x /week       on Mon Wed Fri 39 tablet 0  . sertraline (ZOLOFT) 100 MG tablet TAKE 1 OR 2 TABLETS BY MOUTH DAILY FOR ANXIETY 180 tablet 1  . Vitamin D, Ergocalciferol, (DRISDOL) 1.25 MG (50000 UNIT) CAPS capsule TAKE 1 CAPSULE BY MOUTH 2 DAYS A WEEK 24 capsule 2   No current facility-administered medications on file prior to visit.    Allergies  Allergen Reactions  . Levaquin [Levofloxacin In D5w] Other (See Comments)    Ankles hurt  really bad ? ACHILLES TENDON ?  Marland Kitchen Doxycycline Other (See Comments)    UNSPECIFIED REACTION     Physical exam:  Today's Vitals   01/18/21 0916  BP: 118/74  Weight: 222 lb (100.7 kg)  Height: 5\' 3"  (1.6 m)   Body mass index is 39.33 kg/m.   Wt Readings from Last 3 Encounters:  01/18/21 222 lb (100.7 kg)  01/01/21 220 lb 12.8 oz (100.2 kg)  11/02/20 219 lb (99.3 kg)     Ht Readings from Last 3 Encounters:  01/18/21 5\' 3"  (1.6 m)  01/01/21 5\' 3"  (1.6 m)  08/09/20 5' 3.5" (1.613 m)      General: The patient is awake, alert and appears not in acute distress.  The patient is well groomed. Head: facial port wine stain.  Neck is supple. Mallampati 3 plus  neck circumference:17 inches . Nasal airflow is patent.  Retrognathia is seen. Cardiovascular:  Regular rate and cardiac rhythm by pulse,  without distended neck veins.  Hypotension today !!! Respiratory: Lungs are clear to auscultation.  Skin: facial skin thickening.  Trunk: The patient's posture is erect.   Neurologic exam : The patient is awake and alert, oriented to place and time.   Memory subjective described as intact.  Attention span & concentration ability appears normal.  Speech is fluent,  without  dysarthria, dysphonia or aphasia.  Mood and affect are appropriate.   Cranial nerves: no loss of smell or taste reported  Pupils are equal and briskly reactive to light. Funduscopic exam deferred.  Extraocular movements without nystagmus. No Diplopia. Visual fields by finger perimetry are intact. Hearing was intact to soft voice and finger rubbing.    Facial sensation intact to fine touch by q-tip.  Facial motor strength is symmetric, but middle branch of the right face is covered by port-wine stain.  The l right upper lip is appearing swollen.  The  tongue and uvula move midline.  Neck ROM : rotation, tilt and flexion extension were normal for age and shoulder shrug was symmetrical.    Motor exam:  Symmetric  bulk, tone and ROM.   Normal tone without cog wheeling, symmetric grip strength . Stiffness in both hands- AND swelling. Had a trigger finger right ring finger     Coordination: Penmanship unchanged.  The Finger-to-nose maneuver was intact.  Gait and station: Patient could rise unassisted from a seated position, walked without assistive device. She has a slight stoop, Stance is of wider base and the patient turned with 3.5 steps.  Toe and heel walk were deferred.  Deep tendon reflexes: in the  upper and lower extremities are symmetric and intact.  Babinski response was deferred .    After spending a total time of 20 minutes face to face and additional time for physical and neurologic examination, review of laboratory studies,  personal review of imaging studies, reports and results of other testing and review of referral information / records as far as provided in visit, I have established the following assessments:  1) OSA and coughing- her OSA will be re assessed , but her coughing is related to second hand smoke- she needs to create the smoke free environment  2) Psychogenic chest pain, insomnia and anxiety- afraid of the future.  Grieving her job. Depressed, not leaving the bed for half the day.  This reactive depression has let to bad sleep habits.  3) obesity,  Shoulder pain, postnasal drain and coughing affect sleep   My Plan is to proceed with:  1) HST or PSG split , we cannot do either before the end of the calendar year, and insurance will run out January 2022.  HST is cheaper. ONO- only test on record was from November 2016.  2) her CPAP is on recall, new CPAPs are hard to get due to supply chain delays.  3) Cost related - HST .  I would like to thank Kristina Pinto, MD for allowing me to meet with, and to take care of, this pleasant patient.   In short, Kristina Webb is presenting with insomnia, hypersomnia, untreated OSA, Asthma. I plan to follow up either personally or  through our NP within 4 month.   CC: I will share my notes with PCP .  Electronically signed by: Kristina Seat, MD 01/18/2021 9:38 AM  [facility] Board certified by [employer] of Sleep  Medicine and Diplomate of the American Academy of Sleep Medicine. Board certified In Neurology through the [employer], Fellow of the [employer] of Neurology. Medical Director of [facility].

## 2021-01-23 ENCOUNTER — Encounter: Payer: Self-pay | Admitting: Adult Health Nurse Practitioner

## 2021-01-24 LAB — HM MAMMOGRAPHY

## 2021-01-25 ENCOUNTER — Encounter: Payer: Self-pay | Admitting: Adult Health Nurse Practitioner

## 2021-01-25 ENCOUNTER — Ambulatory Visit (INDEPENDENT_AMBULATORY_CARE_PROVIDER_SITE_OTHER): Payer: 59 | Admitting: Adult Health

## 2021-01-25 ENCOUNTER — Other Ambulatory Visit: Payer: Self-pay

## 2021-01-25 ENCOUNTER — Encounter: Payer: Self-pay | Admitting: Adult Health

## 2021-01-25 VITALS — BP 102/72 | HR 64 | Temp 96.4°F | Ht 63.0 in | Wt 222.0 lb

## 2021-01-25 DIAGNOSIS — Q858 Other phakomatoses, not elsewhere classified: Secondary | ICD-10-CM

## 2021-01-25 DIAGNOSIS — K2100 Gastro-esophageal reflux disease with esophagitis, without bleeding: Secondary | ICD-10-CM

## 2021-01-25 DIAGNOSIS — Z1389 Encounter for screening for other disorder: Secondary | ICD-10-CM

## 2021-01-25 DIAGNOSIS — Z6839 Body mass index (BMI) 39.0-39.9, adult: Secondary | ICD-10-CM

## 2021-01-25 DIAGNOSIS — Z131 Encounter for screening for diabetes mellitus: Secondary | ICD-10-CM | POA: Diagnosis not present

## 2021-01-25 DIAGNOSIS — Z8249 Family history of ischemic heart disease and other diseases of the circulatory system: Secondary | ICD-10-CM | POA: Diagnosis not present

## 2021-01-25 DIAGNOSIS — Z79899 Other long term (current) drug therapy: Secondary | ICD-10-CM | POA: Diagnosis not present

## 2021-01-25 DIAGNOSIS — Q8589 Other phakomatoses, not elsewhere classified: Secondary | ICD-10-CM

## 2021-01-25 DIAGNOSIS — I4510 Unspecified right bundle-branch block: Secondary | ICD-10-CM | POA: Insufficient documentation

## 2021-01-25 DIAGNOSIS — Z13220 Encounter for screening for lipoid disorders: Secondary | ICD-10-CM

## 2021-01-25 DIAGNOSIS — I10 Essential (primary) hypertension: Secondary | ICD-10-CM

## 2021-01-25 DIAGNOSIS — Z136 Encounter for screening for cardiovascular disorders: Secondary | ICD-10-CM

## 2021-01-25 DIAGNOSIS — Z130 Encounter for screening for diseases of the blood and blood-forming organs and certain disorders involving the immune mechanism: Secondary | ICD-10-CM

## 2021-01-25 DIAGNOSIS — E119 Type 2 diabetes mellitus without complications: Secondary | ICD-10-CM

## 2021-01-25 DIAGNOSIS — F324 Major depressive disorder, single episode, in partial remission: Secondary | ICD-10-CM

## 2021-01-25 DIAGNOSIS — E559 Vitamin D deficiency, unspecified: Secondary | ICD-10-CM

## 2021-01-25 DIAGNOSIS — F5105 Insomnia due to other mental disorder: Secondary | ICD-10-CM

## 2021-01-25 DIAGNOSIS — D649 Anemia, unspecified: Secondary | ICD-10-CM

## 2021-01-25 DIAGNOSIS — E785 Hyperlipidemia, unspecified: Secondary | ICD-10-CM

## 2021-01-25 DIAGNOSIS — Z0000 Encounter for general adult medical examination without abnormal findings: Secondary | ICD-10-CM | POA: Diagnosis not present

## 2021-01-25 DIAGNOSIS — D229 Melanocytic nevi, unspecified: Secondary | ICD-10-CM

## 2021-01-25 DIAGNOSIS — E662 Morbid (severe) obesity with alveolar hypoventilation: Secondary | ICD-10-CM

## 2021-01-25 DIAGNOSIS — G4733 Obstructive sleep apnea (adult) (pediatric): Secondary | ICD-10-CM

## 2021-01-25 DIAGNOSIS — J45991 Cough variant asthma: Secondary | ICD-10-CM

## 2021-01-25 DIAGNOSIS — N182 Chronic kidney disease, stage 2 (mild): Secondary | ICD-10-CM

## 2021-01-25 DIAGNOSIS — E1169 Type 2 diabetes mellitus with other specified complication: Secondary | ICD-10-CM

## 2021-01-25 DIAGNOSIS — E1122 Type 2 diabetes mellitus with diabetic chronic kidney disease: Secondary | ICD-10-CM

## 2021-01-25 MED ORDER — SERTRALINE HCL 100 MG PO TABS: ORAL_TABLET | ORAL | 3 refills | Status: DC

## 2021-01-25 MED ORDER — OZEMPIC (0.25 OR 0.5 MG/DOSE) 2 MG/1.5ML ~~LOC~~ SOPN: PEN_INJECTOR | SUBCUTANEOUS | 0 refills | Status: DC

## 2021-01-25 NOTE — Progress Notes (Signed)
Complete Physical  Assessment and Plan:  Caliya was seen today for annual exam.  Diagnoses and all orders for this visit:  Encounter for annual physical exam Yearly   Essential hypertension Continue medication Monitor blood pressure at home; call if consistently over 130/80 Continue DASH diet.   Reminder to go to the ER if any CP, SOB, nausea, dizziness, severe HA, changes vision/speech, left arm numbness and tingling and jaw pain. -     CBC with Differential/Platelet -     COMPLETE METABOLIC PANEL WITH GFR -     Magnesium -     EKG 12-Lead -     Microalbumin / creatinine urine ratio  Mixed hyperlipidemia associated with DMII Continue medications Discussed dietary and exercise modifications Low fat diet -     Lipid panel  Gastroesophageal reflux disease with esophagitis Well managed on current medications Discussed diet, avoiding triggers and other lifestyle changes -     Magnesium  Type 2 diabetes mellitus with stage 2 chronic kidney disease, without long-term current use of insulin (HCC) Continue Metformin500mg  BID -     TSH -     Hemoglobin A1c -     HM DIABETES FOOT EXAM -     Microalbumin / creatinine urine ratio  CKD stage 2 due to type 2 diabetes mellitus (HCC) Increase fluids, avoid NSAIDS, monitor sugars, will monitor -     COMPLETE METABOLIC PANEL WITH GFR  Depression, 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 No antacid medications for 4 hours.  Obstructive sleep apnea syndrome On CPAP with restorative sleep; neuro follows Weight loss encouraged  Obesity, morbid  (HCC) BMI 39  Discussed dietary and exercise modifications Newly on ozempic; 0.25 mg well tolerated, do 0.5 mg weekly x 1 month, then contact office for 1 mg/week  Discussed dietary modifications Follow up in one month -     Hemoglobin A1c -     Insulin, random  Asthma, moderate persistent Start using Breo 100/25 DAILY, rinse mouth after use  Has in date rescue inhaler Ventolin Q4-6PRN Using twice a week with increased pollen  Anemia, unspecified type Not taking iron at this time, last iron was borderline -     Iron,Total/Total Iron Binding Cap/Ferritin  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  Encounter for screening for cardiovascular disorders -     EKG 12-Lead  Medication management Continued  Atypical nevi Some concerning features, overdue with derm Discussed biopsy here vs derm follow up, states will schedule with established derm  Orders Placed This Encounter  Procedures  . CBC with Differential/Platelet  . COMPLETE METABOLIC PANEL WITH GFR  . Magnesium  . Lipid panel  . TSH  . Hemoglobin A1c  .  VITAMIN D 25 Hydroxy (Vit-D Deficiency, Fractures)  . Microalbumin / creatinine urine ratio  . Urinalysis, Routine w reflex microscopic  . Iron, TIBC and Ferritin Panel  . EKG 12-Lead  . HM DIABETES FOOT EXAM     Discussed med's effects and SE's. Screening labs and tests as requested with regular follow-up as recommended. Over 40 minutes of face to face exam, counseling, chart review, and complex, high level critical decision making was  performed this visit.   Future Appointments  Date Time Provider [place]  05/09/2021  3:20 PM Buford Dresser, MD [facility]  01/23/2022  2:00 PM Ward Givens, NP [facility] None  01/28/2022  3:00 PM Liane Comber, NP [facility] None     HPI  65 y.o. female  presents for a complete physical and follow up for has Cough; Hypertension; GERD (gastroesophageal reflux disease); Anemia; Asthma; Major depression in partial remission ([place]); OSA (obstructive sleep apnea); Hyperlipidemia associated with type 2 diabetes mellitus ([place]); Type 2 diabetes mellitus ([place]); CKD stage 2 due to type 2 diabetes mellitus ([place]); Sturge-Weber syndrome ([place]); Class 2 severe obesity due to excess calories with serious comorbidity and body mass index (BMI) of 39.0 to 39.9 in adult [facility]); Lower back pain; Obesity hypoventilation syndrome ([place]); Family history of heart disease; Insomnia due to other mental disorder; Severe obstructive sleep apnea-hypopnea syndrome; Exposure to second hand tobacco smoke; Cough variant asthma; and Sturge-Weber disease ([place]) on their problem list.   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 Admits a bit depressed, wasn't ready. Zoloft 100 mg does help, also takes amitriptyline 10 mg PRN. Doing more crocheting.   She has cough variant asthma, on breo (but admits takes intermittently PRN with allergies), singulaire, albuterol PRN.  On protonix 40 mg daily.   She had sleep study 08/2020 by Dr. Brett Fairy showing sever OSA, now on CPAP and reports doing well, reports 100% compliance.   BMI is Body mass index is 39.33 kg/m., she has been working on diet, admits less walking since not at work, has park could go to.  She is newly on ozempic 0.25 mg/week and tolerating  Wt Readings from Last 3 Encounters:  01/25/21 222 lb (100.7 kg)  01/18/21 222 lb (100.7 kg)  01/01/21 220 lb 12.8 oz (100.2 kg)   Her  blood pressure has been controlled at home, today their BP is BP: 102/72 She does not workout. She denies chest pain, dizziness. She does note after no recent exercise, will get short of breath after 10 min of exertion. Did see cardiology Dr. Harrell Gave, had CT coronary calcium of 6 on 05/2020, felt deconditioning.   She is on cholesterol medication and denies myalgias. Her cholesterol is at goal. The cholesterol last visit was:   Lab Results  Component Value Date   CHOL 138 11/02/2020   HDL 50 11/02/2020   LDLCALC 61 11/02/2020   TRIG 196 (H) 11/02/2020   CHOLHDL 2.8 11/02/2020   She has been working on diet and exercise for DMII, she is on bASA, she is on ACE/ARB and denies hyperglycemia, hypoglycemia , increased appetite, nausea, polydipsia, polyuria, visual disturbances, vomiting and weight loss.  Metformin 500 mg tabs 1-2 tab daily.  Newly on ozempic 0.25 mg/week.  Last A1C in the office was:  Lab Results  Component Value Date   HGBA1C 6.6 (H) 11/02/2020   Baseline CKD II recently trending down, admits to poor water intake, does diet  soda. Last GFR: Lab Results  Component Value Date   GFRNONAA 45 (L) 11/02/2020   GFRNONAA 56 (L) 07/18/2020   GFRNONAA 69 05/18/2020   She is on thyroid medication. Her medication was not changed last visit.  She is taking synthroid 39mcg daily. Lab Results  Component Value Date   TSH 3.94 11/02/2020  .  Patient is on Vitamin D supplement.   Lab Results  Component Value Date   VD25OH 84 07/18/2020        Current Medications:  Current Outpatient Medications on File Prior to Visit  Medication Sig Dispense Refill  . acetaminophen (TYLENOL) 500 MG tablet Take 1,000 mg by mouth every 6 (six) hours as needed for mild pain, moderate pain or headache.    . albuterol (PROAIR HFA) 108 (90 Base) MCG/ACT inhaler Inhale 2 puffs into the lungs every 6 (six) hours as needed for wheezing. 18 g 1  . amitriptyline (ELAVIL) 10 MG tablet Take 1 tablet (10  mg total) by mouth at bedtime as needed.    Marland Kitchen amLODipine (NORVASC) 10 MG tablet TAKE 1 TABLET DAILY FOR BLOOD PRESSURE 90 tablet 1  . benzonatate (TESSALON) 200 MG capsule Take 1 capsule (200 mg total) by mouth 3 (three) times daily as needed for cough (Max: 600mg  per day). Take  1 capsule  3 x /day  to Prevent Cough 30 capsule 1  . bisoprolol (ZEBETA) 10 MG tablet TAKE 1 TABLET DAILY FOR BLOOD PRESSURE 90 tablet 3  . Blood Glucose Monitoring Suppl DEVI Use to test blood sugar once daily. Dx: E11.22, N18.2 1 Device 0  . fluticasone furoate-vilanterol (BREO ELLIPTA) 100-25 MCG/INH AEPB Inhale 1 puff into the lungs daily. Rinse mouth after use. 1 each 2  . FREESTYLE LITE test strip USE TO CHECK BLOOD SUGAR ONCE DAILY. DX: E11.22, N18.2 100 strip 2  . furosemide (LASIX) 40 MG tablet TAKE 1 TABLET EVERY MORNING FOR BLOOD PRESSURE AND FLUID 90 tablet 1  . Lancets MISC Use to check blood sugar once daily. Dx: E11.22, N18.2 100 each 2  . levothyroxine (SYNTHROID) 75 MCG tablet TAKE 1 TABLET BY MOUTH EVERY DAY ON EMPTY STOMACH WITH WATER. NO ANTACID MEDS FOR 30 MINS 90 tablet 3  . metFORMIN (GLUCOPHAGE-XR) 500 MG 24 hr tablet Take 1 tablet (500 mg total) by mouth 2 (two) times daily. 180 tablet 2  . montelukast (SINGULAIR) 10 MG tablet Take  1 tablet  Daily  for Allergies 90 tablet 3  . olmesartan (BENICAR) 40 MG tablet TAKE 1 TABLET DAILY FOR BP & DIABETIC KIDNEY PROTECTION 90 tablet 1  . pantoprazole (PROTONIX) 40 MG tablet TAKE 1 TABLET DAILY FOR HEARTBURN & INDIGESTION 90 tablet 3  . promethazine-dextromethorphan (PROMETHAZINE-DM) 6.25-15 MG/5ML syrup Take 1 teaspoonful every 4 hours as needed for cough 240 mL 1  . rosuvastatin (CRESTOR) 10 MG tablet Take     1 tablet    3 x /week       on Mon Wed Fri 39 tablet 0  . sertraline (ZOLOFT) 100 MG tablet TAKE 1 OR 2 TABLETS BY MOUTH DAILY FOR ANXIETY 180 tablet 1  . Vitamin D, Ergocalciferol, (DRISDOL) 1.25 MG (50000 UNIT) CAPS capsule TAKE 1 CAPSULE BY  MOUTH 2 DAYS A WEEK 24 capsule 2   No current facility-administered medications on file prior to visit.   Allergies:  Allergies  Allergen Reactions  . Levaquin [Levofloxacin In D5w] Other (See Comments)    Ankles hurt really bad ? ACHILLES TENDON ?  Marland Kitchen  Doxycycline Other (See Comments)    UNSPECIFIED REACTION    Medical History:  She has Cough; Hypertension; GERD (gastroesophageal reflux disease); Anemia; Asthma; Major depression in partial remission ([place]); OSA (obstructive sleep apnea); Hyperlipidemia associated with type 2 diabetes mellitus ([place]); Type 2 diabetes mellitus ([place]); CKD stage 2 due to type 2 diabetes mellitus ([place]); Sturge-Weber syndrome ([place]); Class 2 severe obesity due to excess calories with serious comorbidity and body mass index (BMI) of 39.0 to 39.9 in adult [facility]); Lower back pain; Obesity hypoventilation syndrome ([place]); Family history of heart disease; Insomnia due to other mental disorder; Severe obstructive sleep apnea-hypopnea syndrome; Exposure to second hand tobacco smoke; Cough variant asthma; and Sturge-Weber disease ([place]) on their problem list. Health Maintenance:   Immunization History  Administered Date(s) Administered  . Influenza Inj Mdck Quad With Preservative 06/14/2020  . Influenza Split 04/26/2013, 05/29/2015  . Influenza Whole 05/26/2012  . Influenza-Unspecified 05/20/2017, 05/19/2018, 05/21/2019  . PFIZER(Purple Top)SARS-COV-2 Vaccination 10/22/2019, 11/26/2019, 05/30/2020  . Pneumococcal Polysaccharide-23 09/10/2016  . Td 08/27/2007, 10/22/2017    Tetanus: 2019 Pneumovax: 2018 Prevnar 13: Due at age 78 Flu vaccine: 2021 reported Shingrix: declines for now, check with insurance after discussion  Covid 19: 2/2 + booster  Pap: pelvic 02/2020?, Due Dr Perlie Gold, has upcoming scheduled MGM: 07/2020, R diagnostic 11/2020 due to bloody discharge, had MRI, unclear, Dr. Rainey Pines following, considering biopsy  DEXA: Due at age 71 Colonoscopy: 10/2017, 5  year recall   EKG: 01/2020 - NSR CXR: 07/2018  Last Dental Exam: last 2022, goes q66m  Last Eye Exam: Dr. Valetta Close, lat 10/17/2020, no retinopathy Last derm exam: 3+ years ago, has had several removed by derm, Dr. Allyson Sabal office, overdue  Patient Care Team: Unk Pinto, MD as PCP - General (Internal Medicine) Buford Dresser, MD as PCP - Cardiology (Cardiology) Newt Minion, MD as Consulting Physician (Orthopedic Surgery) Love, Alyson Locket, MD as Consulting Physician (Neurology) Ralene Bathe, MD as Consulting Physician (Ophthalmology) Gastroenterology, Sadie Haber as Consulting Physician (Gastroenterology)  Surgical History:  She has a past surgical history that includes Back surgery; Ankle ganglion cyst excision (Left); birth mark treatments; Breast lumpectomy with radioactive seed localization (Left, 02/02/2016); Breast surgery (Right); Colon surgery (2008); Cholecystectomy (N/A, 02/20/2017); and Cataract extraction (Right, 12/2016). Family History:  Herfamily history includes Allergies in her mother; Asthma in her mother; Brain cancer in her father; Breast cancer in her mother; Colon cancer in her paternal grandmother; Emphysema in her mother; Heart disease in her mother; Hypertension in her brother; Prostate cancer in her father. Social History:  She reports that she has never smoked. She has never used smokeless tobacco. She reports that she does not drink alcohol and does not use drugs.   Review of Systems: Review of Systems  Constitutional: Negative for malaise/fatigue and weight loss.  HENT: Negative for hearing loss and tinnitus.   Eyes: Negative for blurred vision and double vision.  Respiratory: Negative for cough, sputum production, shortness of breath and wheezing.   Cardiovascular: Negative for chest pain, palpitations, orthopnea, claudication, leg swelling and PND.  Gastrointestinal: Positive for constipation (mild, managing with softeners). Negative for abdominal pain,  blood in stool, diarrhea, heartburn, melena, nausea and vomiting.  Genitourinary: Negative.   Musculoskeletal: Negative for falls, joint pain and myalgias.  Skin: Negative for rash.  Neurological: Negative for dizziness, tingling, sensory change, weakness and headaches.  Endo/Heme/Allergies: Negative for polydipsia.  Psychiatric/Behavioral: Positive for depression (down since retirement). Negative for memory loss, substance abuse and suicidal ideas. The patient is  not nervous/anxious and does not have insomnia.   All other systems reviewed and are negative.   Physical Exam: Estimated body mass index is 39.33 kg/m as calculated from the following:   Height as of this encounter: 5\' 3"  (1.6 m).   Weight as of this encounter: 222 lb (100.7 kg). BP 102/72   Pulse 64   Temp (!) 96.4 F (35.8 C)   Ht 5\' 3"  (1.6 m)   Wt 222 lb (100.7 kg)   SpO2 96%   BMI 39.33 kg/m  General Appearance: Well nourished, in no apparent distress.  Eyes: PERRLA, EOMs, conjunctiva no swelling or erythema, normal fundi and vessels.  Sinuses: No Frontal/maxillary tenderness  ENT/Mouth: Ext aud canals clear, normal light reflex with TMs without erythema, bulging. Good dentition. No erythema, swelling, or exudate on post pharynx. Tonsils not swollen or erythematous. Hearing normal.  Neck: Supple, thyroid normal. No bruits  Respiratory: Respiratory effort normal, BS equal bilaterally without rales, rhonchi, wheezing or stridor.  Cardio: RRR without murmurs, rubs or gallops. Brisk peripheral pulses without edema.  Chest: symmetric, with normal excursions and percussion.  Abdomen: Soft obese, nontender, no guarding, rebound, hernias, masses, or organomegaly.  Lymphatics: Non tender without lymphadenopathy.  Musculoskeletal: Full ROM all peripheral extremities,5/5 strength, and normal gait.  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  Warm, dry without rashes, ecchymosis. She has several excoriated areas to abdomen; several  nevi, 1 to mid back on R with irregular color - light/dark [place] and redness, 6+ mm Neuro: Cranial nerves intact, reflexes equal bilaterally. Normal muscle tone, no cerebellar symptoms. Sensation intact.  Psych: Awake and oriented X 3, normal affect, Insight and Judgment appropriate.      EKG: Sinus brady, CRBBB  Gorden Harms [place] Gatt 3:30 PM [facility]

## 2021-01-25 NOTE — Patient Instructions (Addendum)
Kristina Webb , Thank you for taking time to come for your Annual Wellness Visit. I appreciate your ongoing commitment to your health goals. Please review the following plan we discussed and let me know if I can assist you in the future.   These are the goals we discussed: Goals    . Exercise 150 min/wk Moderate Activity    . Weight (lb) < 200 lb (90.7 kg)       This is a list of the screening recommended for you and due dates:  Health Maintenance  Topic Date Due  . Zoster (Shingles) Vaccine (1 of 2) Never done  . Pap Smear  05/16/2018  . COVID-19 Vaccine (4 - Booster for [place] series) 08/30/2020  . Flu Shot  03/26/2021  . Hemoglobin A1C  05/05/2021  . Mammogram  08/11/2021  . Eye exam for diabetics  10/17/2021  . Complete foot exam   01/25/2022  . Colon Cancer Screening  11/11/2022  . Tetanus Vaccine  10/23/2027  . Pneumococcal vaccine  Completed  . Hepatitis C Screening: USPSTF Recommendation to screen - Ages 62-79 yo.  Completed  . HIV Screening  Completed  . HPV Vaccine  Aged Out   Check with insurance about shingrix - 2 shots, 2-6 months apart, 90-95% of shingles prevented, can get at pharmacy   Please have GYN sent PAP reports  Reduce diet soda, increase water  Incease beans, old fashioned oats, high fiber foods    Know what a healthy weight is for you (roughly BMI <25) and aim to maintain this  Aim for 7+ servings of fruits and vegetables daily  65-80+ fluid ounces of water or unsweet tea for healthy kidneys  Limit to max 1 drink of alcohol per day; avoid smoking/tobacco  Limit animal fats in diet for cholesterol and heart health - choose grass fed whenever available  Avoid highly processed foods, and foods high in saturated/trans fats  Aim for low stress - take time to unwind and care for your mental health  Aim for 150 min of moderate intensity exercise weekly for heart health, and weights twice weekly for bone health  Aim for 7-9 hours of sleep  daily     High-Fiber Eating Plan Fiber, also called dietary fiber, is a type of carbohydrate. It is found foods such as fruits, vegetables, whole grains, and beans. A high-fiber diet can have many health benefits. Your health care provider may recommend a high-fiber diet to help:  Prevent constipation. Fiber can make your bowel movements more regular.  Lower your cholesterol.  Relieve the following conditions: ? Inflammation of veins in the anus (hemorrhoids). ? Inflammation of specific areas of the digestive tract (uncomplicated diverticulosis). ? A problem of the large intestine, also called the colon, that sometimes causes pain and diarrhea (irritable bowel syndrome, or IBS).  Prevent overeating as part of a weight-loss plan.  Prevent heart disease, type 2 diabetes, and certain cancers. What are tips for following this plan? Reading food labels  Check the nutrition facts label on food products for the amount of dietary fiber. Choose foods that have 5 grams of fiber or more per serving.  The goals for recommended daily fiber intake include: ? Men (age 50 or younger): 34-38 g. ? Men (over age 14): 28-34 g. ? Women (age 30 or younger): 25-28 g. ? Women (over age 30): 22-25 g. Your daily fiber goal is _____________ g.   Shopping  Choose whole fruits and vegetables instead of processed forms, such  as apple juice or applesauce.  Choose a wide variety of high-fiber foods such as avocados, lentils, oats, and kidney beans.  Read the nutrition facts label of the foods you choose. Be aware of foods with added fiber. These foods often have high sugar and sodium amounts per serving. Cooking  Use whole-grain flour for baking and cooking.  Cook with Befort rice instead of white rice. Meal planning  Start the day with a breakfast that is high in fiber, such as a cereal that contains 5 g of fiber or more per serving.  Eat breads and cereals that are made with whole-grain flour instead  of refined flour or white flour.  Eat [place] rice, bulgur wheat, or millet instead of white rice.  Use beans in place of meat in soups, salads, and pasta dishes.  Be sure that half of the grains you eat each day are whole grains. General information  You can get the recommended daily intake of dietary fiber by: ? Eating a variety of fruits, vegetables, grains, nuts, and beans. ? Taking a fiber supplement if you are not able to take in enough fiber in your diet. It is better to get fiber through food than from a supplement.  Gradually increase how much fiber you consume. If you increase your intake of dietary fiber too quickly, you may have bloating, cramping, or gas.  Drink plenty of water to help you digest fiber.  Choose high-fiber snacks, such as berries, raw vegetables, nuts, and popcorn. What foods should I eat? Fruits Berries. Pears. Apples. Oranges. Avocado. Prunes and raisins. Dried figs. Vegetables Sweet potatoes. Spinach. Kale. Artichokes. Cabbage. Broccoli. Cauliflower. Green peas. Carrots. Squash. Grains Whole-grain breads. Multigrain cereal. Oats and oatmeal. Rennie rice. Barley. Bulgur wheat. [place]. Quinoa. Bran muffins. Popcorn. Rye wafer crackers. Meats and other proteins Navy beans, kidney beans, and pinto beans. Soybeans. Split peas. Lentils. Nuts and seeds. Dairy Fiber-fortified yogurt. Beverages Fiber-fortified soy milk. Fiber-fortified orange juice. Other foods Fiber bars. The items listed above may not be a complete list of recommended foods and beverages. Contact a dietitian for more information. What foods should I avoid? Fruits Fruit juice. Cooked, strained fruit. Vegetables Fried potatoes. Canned vegetables. Well-cooked vegetables. Grains White bread. Pasta made with refined flour. White rice. Meats and other proteins Fatty cuts of meat. Fried chicken or fried fish. Dairy Milk. Yogurt. Cream cheese. Sour cream. Fats and  oils Butters. Beverages Soft drinks. Other foods Cakes and pastries. The items listed above may not be a complete list of foods and beverages to avoid. Talk with your dietitian about what choices are best for you. Summary  Fiber is a type of carbohydrate. It is found in foods such as fruits, vegetables, whole grains, and beans.  A high-fiber diet has many benefits. It can help to prevent constipation, lower blood cholesterol, aid weight loss, and reduce your risk of heart disease, diabetes, and certain cancers.  Increase your intake of fiber gradually. Increasing fiber too quickly may cause cramping, bloating, and gas. Drink plenty of water while you increase the amount of fiber you consume.  The best sources of fiber include whole fruits and vegetables, whole grains, nuts, seeds, and beans. This information is not intended to replace advice given to you by your health care provider. Make sure you discuss any questions you have with your health care provider. Document Revised: 12/16/2019 Document Reviewed: 12/16/2019 Elsevier Patient Education  2021 [employer].

## 2021-01-26 ENCOUNTER — Other Ambulatory Visit: Payer: Self-pay | Admitting: Adult Health

## 2021-01-26 ENCOUNTER — Encounter: Payer: Self-pay | Admitting: Adult Health

## 2021-01-26 DIAGNOSIS — E611 Iron deficiency: Secondary | ICD-10-CM

## 2021-01-26 DIAGNOSIS — E039 Hypothyroidism, unspecified: Secondary | ICD-10-CM

## 2021-01-26 LAB — IRON,TIBC AND FERRITIN PANEL
%SAT: 10 % (calc) — ABNORMAL LOW (ref 16–45)
Ferritin: 15 ng/mL — ABNORMAL LOW (ref 16–288)
Iron: 46 ug/dL (ref 45–160)
TIBC: 461 mcg/dL (calc) — ABNORMAL HIGH (ref 250–450)

## 2021-01-26 LAB — URINALYSIS, ROUTINE W REFLEX MICROSCOPIC
Bilirubin Urine: NEGATIVE
Glucose, UA: NEGATIVE
Hgb urine dipstick: NEGATIVE
Ketones, ur: NEGATIVE
Leukocytes,Ua: NEGATIVE
Nitrite: NEGATIVE
Protein, ur: NEGATIVE
Specific Gravity, Urine: 1.014 (ref 1.001–1.035)
pH: 5.5 (ref 5.0–8.0)

## 2021-01-26 LAB — COMPLETE METABOLIC PANEL WITH GFR
AG Ratio: 1.5 (calc) (ref 1.0–2.5)
ALT: 35 U/L — ABNORMAL HIGH (ref 6–29)
AST: 34 U/L (ref 10–35)
Albumin: 4.4 g/dL (ref 3.6–5.1)
Alkaline phosphatase (APISO): 59 U/L (ref 37–153)
BUN/Creatinine Ratio: 19 (calc) (ref 6–22)
BUN: 22 mg/dL (ref 7–25)
CO2: 30 mmol/L (ref 20–32)
Calcium: 9.9 mg/dL (ref 8.6–10.4)
Chloride: 101 mmol/L (ref 98–110)
Creat: 1.15 mg/dL — ABNORMAL HIGH (ref 0.50–0.99)
GFR, Est African American: 59 mL/min/{1.73_m2} — ABNORMAL LOW (ref >=60)
GFR, Est Non African American: 51 mL/min/{1.73_m2} — ABNORMAL LOW (ref >=60)
Globulin: 3 g/dL (calc) (ref 1.9–3.7)
Glucose, Bld: 84 mg/dL (ref 65–99)
Potassium: 4.7 mmol/L (ref 3.5–5.3)
Sodium: 140 mmol/L (ref 135–146)
Total Bilirubin: 0.3 mg/dL (ref 0.2–1.2)
Total Protein: 7.4 g/dL (ref 6.1–8.1)

## 2021-01-26 LAB — VITAMIN D 25 HYDROXY (VIT D DEFICIENCY, FRACTURES): Vit D, 25-Hydroxy: 94 ng/mL (ref 30–100)

## 2021-01-26 LAB — MICROALBUMIN / CREATININE URINE RATIO
Creatinine, Urine: 99 mg/dL (ref 20–275)
Microalb Creat Ratio: 2 mcg/mg creat (ref <30)
Microalb, Ur: 0.2 mg/dL

## 2021-01-26 LAB — CBC WITH DIFFERENTIAL/PLATELET
Absolute Monocytes: 694 cells/uL (ref 200–950)
Basophils Absolute: 51 cells/uL (ref 0–200)
Basophils Relative: 0.5 %
Eosinophils Absolute: 459 cells/uL (ref 15–500)
Eosinophils Relative: 4.5 %
HCT: 36.9 % (ref 35.0–45.0)
Hemoglobin: 11.8 g/dL (ref 11.7–15.5)
Lymphs Abs: 2917 cells/uL (ref 850–3900)
MCH: 27 pg (ref 27.0–33.0)
MCHC: 32 g/dL (ref 32.0–36.0)
MCV: 84.4 fL (ref 80.0–100.0)
MPV: 9.9 fL (ref 7.5–12.5)
Monocytes Relative: 6.8 %
Neutro Abs: 6079 cells/uL (ref 1500–7800)
Neutrophils Relative %: 59.6 %
Platelets: 283 10*3/uL (ref 140–400)
RBC: 4.37 10*6/uL (ref 3.80–5.10)
RDW: 15.6 % — ABNORMAL HIGH (ref 11.0–15.0)
Total Lymphocyte: 28.6 %
WBC: 10.2 10*3/uL (ref 3.8–10.8)

## 2021-01-26 LAB — LIPID PANEL
Cholesterol: 129 mg/dL (ref <200)
HDL: 53 mg/dL (ref >=50)
LDL Cholesterol (Calc): 53 mg/dL (calc)
Non-HDL Cholesterol (Calc): 76 mg/dL (calc) (ref <130)
Total CHOL/HDL Ratio: 2.4 (calc) (ref <5.0)
Triglycerides: 149 mg/dL (ref <150)

## 2021-01-26 LAB — MAGNESIUM: Magnesium: 2 mg/dL (ref 1.5–2.5)

## 2021-01-26 LAB — HEMOGLOBIN A1C
Hgb A1c MFr Bld: 6.4 % of total Hgb — ABNORMAL HIGH (ref <5.7)
Mean Plasma Glucose: 137 mg/dL
eAG (mmol/L): 7.6 mmol/L

## 2021-01-26 LAB — TSH: TSH: 4.59 mIU/L — ABNORMAL HIGH (ref 0.40–4.50)

## 2021-02-06 ENCOUNTER — Encounter: Payer: Self-pay | Admitting: Internal Medicine

## 2021-02-28 ENCOUNTER — Other Ambulatory Visit: Payer: Self-pay

## 2021-02-28 ENCOUNTER — Other Ambulatory Visit: Payer: 59

## 2021-02-28 DIAGNOSIS — E039 Hypothyroidism, unspecified: Secondary | ICD-10-CM

## 2021-03-01 ENCOUNTER — Other Ambulatory Visit: Payer: Self-pay | Admitting: Internal Medicine

## 2021-03-01 LAB — TSH: TSH: 2.57 mIU/L (ref 0.40–4.50)

## 2021-03-01 MED ORDER — FUROSEMIDE 40 MG PO TABS: ORAL_TABLET | ORAL | 3 refills | Status: DC

## 2021-03-02 LAB — HM PAP SMEAR: HM Pap smear: NORMAL

## 2021-03-02 LAB — RESULTS CONSOLE HPV: CHL HPV: NEGATIVE

## 2021-03-12 ENCOUNTER — Other Ambulatory Visit: Payer: Self-pay | Admitting: Nurse Practitioner

## 2021-03-12 ENCOUNTER — Other Ambulatory Visit: Payer: Self-pay | Admitting: Adult Health

## 2021-03-12 DIAGNOSIS — N182 Chronic kidney disease, stage 2 (mild): Secondary | ICD-10-CM

## 2021-03-12 DIAGNOSIS — E119 Type 2 diabetes mellitus without complications: Secondary | ICD-10-CM

## 2021-03-12 MED ORDER — OZEMPIC (1 MG/DOSE) 4 MG/3ML ~~LOC~~ SOPN: 1.0000 mg | PEN_INJECTOR | SUBCUTANEOUS | 2 refills | Status: DC

## 2021-03-12 NOTE — Progress Notes (Signed)
Sent in [place] Prescription for the Ozempic 1 mg SQ QW

## 2021-03-16 ENCOUNTER — Encounter: Payer: Self-pay | Admitting: Internal Medicine

## 2021-04-19 ENCOUNTER — Other Ambulatory Visit: Payer: Self-pay

## 2021-04-19 MED ORDER — VITAMIN D (ERGOCALCIFEROL) 1.25 MG (50000 UNIT) PO CAPS: ORAL_CAPSULE | ORAL | 2 refills | Status: DC

## 2021-05-09 ENCOUNTER — Ambulatory Visit (HOSPITAL_BASED_OUTPATIENT_CLINIC_OR_DEPARTMENT_OTHER): Payer: Self-pay | Admitting: Cardiology

## 2021-05-14 NOTE — Progress Notes (Signed)
Cardiology Office Note:    Date:  05/16/2021   ID:  Kristina Webb, DOB March 23, 1956, MRN [id number]  PCP:  Unk Pinto, MD  Cardiologist:  Buford Dresser, MD  Referring MD: Unk Pinto, MD   CC: follow-up  History of Present Illness:    Kristina Webb is a 65 y.o. female with a hx of hypertension, hyperlipidemia, asthma, OSA not on CPAP who is seen for follow-up. I initially met her 05/09/2020 as a new consult at the request of Unk Pinto, MD for the evaluation and management of chest pressure and shortness of breath. Seen in the past by Dr. Wynonia Lawman.   CV risk -Tobacco: she is not a smoker, but both parents smoked, husband smokes -Comorbidities: hypertension, hyperlipidemia, type II diabetes, OSA not on CPAP, family history, secondhand smoke exposure -Family history: mother had heart failure, breast cancer/womb cancer, high blood pressure, diabetes, hypothyroidism, died age 57. Father had high blood pressure, died of brain tumor. Brother had rheumatic fever, has had valve issues throughout his life. Sister is 81, has had stents for CAD, had a mild stroke, has HTN, no diabetes.  Today: She is accompanied by a family member. Overall she is feeling pretty good, and is not as short of breath as she was last year.   She endorses intermittent central chest pains and aches, and they sometimes feel "deep" as with occurring with deep inspiration. We reviewed again results of her prior CT coronary, which was reassuring.  For a long time she has not been taking her antihypertensives due to low blood pressure. Of note, she has not been eating and drinking adequately, due to taking Ozempic. Following her Ozempic injection yesterday, her blood pressure was low and she was emetic. Also she is experiencing dehydration, nausea, stomach pain, and feels as if she is not digesting her food properly.  For the past two years she has not done much formal exercise. The last time she tried to  go walking she was limited by shortness of breath. She tries to stay active with work around the house, and with maintaining their RV when they travel.  She will follow up with her PCP in a few weeks.  She denies any palpitations. No lightheadedness, headaches, syncope, orthopnea, or PND. Also has no lower extremity edema.   Past Medical History:  Diagnosis Date   Anemia    hx   Anxiety    Arthritis    back   Asthma    Depression    Deviated septum    GERD (gastroesophageal reflux disease)    Headache    History of hiatal hernia    Hyperlipemia    Hypertension    Hypothyroidism    OSA (obstructive sleep apnea)    does not use -last 6 months   Pre-diabetes    per pt    Past Surgical History:  Procedure Laterality Date   ANKLE GANGLION CYST EXCISION Left    BACK SURGERY     birth mark treatments     BREAST LUMPECTOMY WITH RADIOACTIVE SEED LOCALIZATION Left 02/02/2016   Procedure: LEFT BREAST LUMPECTOMY WITH RADIOACTIVE SEED LOCALIZATION;  Surgeon: Autumn Messing III, MD;  Location: [facility];  Service: General;  Laterality: Left;   BREAST SURGERY Right    papilloma removed,cyst lft   CATARACT EXTRACTION Right 12/2016   Dr. Harlow Mares   CHOLECYSTECTOMY N/A 02/20/2017   Procedure: LAPAROSCOPIC CHOLECYSTECTOMY WITH INTRAOPERATIVE CHOLANGIOGRAM;  Surgeon: Jovita Kussmaul, MD;  Location: [facility];  Service: General;  Laterality: N/A;   COLON SURGERY  2008   resection- endometriosis    Current Medications: Current Outpatient Medications on File Prior to Visit  Medication Sig   acetaminophen (TYLENOL) 500 MG tablet Take 1,000 mg by mouth every 6 (six) hours as needed for mild pain, moderate pain or headache.   albuterol (PROAIR HFA) 108 (90 Base) MCG/ACT inhaler Inhale 2 puffs into the lungs every 6 (six) hours as needed for wheezing.   amitriptyline (ELAVIL) 10 MG tablet Take 1 tablet (10 mg total) by mouth at bedtime as needed.   amLODipine (NORVASC) 10 MG tablet TAKE 1 TABLET DAILY FOR  BLOOD PRESSURE   benzonatate (TESSALON) 200 MG capsule Take 1 capsule (200 mg total) by mouth 3 (three) times daily as needed for cough (Max: 624m per day). Take  1 capsule  3 x /day  to Prevent Cough   bisoprolol (ZEBETA) 10 MG tablet TAKE 1 TABLET DAILY FOR BLOOD PRESSURE   Blood Glucose Monitoring Suppl DEVI Use to test blood sugar once daily. Dx: E11.22, N18.2   fluticasone furoate-vilanterol (BREO ELLIPTA) 100-25 MCG/INH AEPB Inhale 1 puff into the lungs daily. Rinse mouth after use.   FREESTYLE LITE test strip USE TO CHECK BLOOD SUGAR ONCE DAILY. DX: E11.22, N18.2   furosemide (LASIX) 40 MG tablet Take  1 tablet  Daily  for BP & Fluid Retention / Ankle Swelling   Lancets MISC Use to check blood sugar once daily. Dx: E11.22, N18.2   levothyroxine (SYNTHROID) 75 MCG tablet TAKE 1 TABLET BY MOUTH EVERY DAY ON EMPTY STOMACH WITH WATER. NO ANTACID MEDS FOR 30 MINS   metFORMIN (GLUCOPHAGE-XR) 500 MG 24 hr tablet Take 1 tablet (500 mg total) by mouth 2 (two) times daily.   montelukast (SINGULAIR) 10 MG tablet Take  1 tablet  Daily  for Allergies   olmesartan (BENICAR) 40 MG tablet TAKE 1 TABLET DAILY FOR BP & DIABETIC KIDNEY PROTECTION   pantoprazole (PROTONIX) 40 MG tablet TAKE 1 TABLET DAILY FOR HEARTBURN & INDIGESTION   promethazine-dextromethorphan (PROMETHAZINE-DM) 6.25-15 MG/5ML syrup Take 1 teaspoonful every 4 hours as needed for cough   rosuvastatin (CRESTOR) 10 MG tablet Take     1 tablet    3 x /week       on Mon Wed Fri   Semaglutide, 1 MG/DOSE, (OZEMPIC, 1 MG/DOSE,) 4 MG/3ML SOPN Inject 1 mg into the skin once a week.   sertraline (ZOLOFT) 100 MG tablet TAKE 1 TABLET DAILY FOR DEPRESSION/ANXIETY   Vitamin D, Ergocalciferol, (DRISDOL) 1.25 MG (50000 UNIT) CAPS capsule TAKE 1 CAPSULE BY MOUTH 2 DAYS A WEEK   No current facility-administered medications on file prior to visit.     Allergies:   Levaquin [levofloxacin in d5w] and Doxycycline   Social History   Tobacco Use   Smoking  status: Never   Smokeless tobacco: Never   Tobacco comments:    husband smokes in the house  Vaping Use   Vaping Use: Never used  Substance Use Topics   Alcohol use: No   Drug use: No    Family History: family history includes Allergies in her mother; Asthma in her mother; Brain cancer in her father; Breast cancer in her mother; Colon cancer in her paternal grandmother; Emphysema in her mother; Heart disease in her mother; Hypertension in her brother; Prostate cancer in her father. There is no history of Allergic rhinitis, Angioedema, Eczema, Immunodeficiency, or Urticaria.  ROS:   Please see the history of present illness. (+)  Central chest pains/aches (+) Exertional shortness of breath (+) Dehydration (+) Nausea (+) Stomach pain (+) Emesis All other systems are reviewed and negative.    EKGs/Labs/Other Studies Reviewed:    The following studies were reviewed today:  Coronary CT 05/26/2020: FINDINGS: Coronary calcium score: The patient's coronary artery calcium score is 6, which places the patient in the 51 percentile.   Coronary arteries: Normal coronary origins.  Left dominance.   Right Coronary Artery: Small caliber, nondominant vessel. No significant plaque or stenosis.   Left Main Coronary Artery: Normal caliber vessel. No significant plaque or stenosis. Small ramus intermedius without significant plaque or stenosis.   Left Anterior Descending Coronary Artery: Normal caliber vessel. Small focus of calcified plaque in the proximal LAD without any stenosis. Gives rise to 1 small diagonal branch.   Left Circumflex Artery: Normal caliber vessel. No significant plaque or stenosis. Gives rise to 1 large OM branch.   Aorta: Normal size, 30 mm at the mid ascending aorta (level of the PA bifurcation) measured double oblique. No calcifications. No dissection.   Aortic Valve: No calcifications. Trileaflet.   Other findings:   Normal pulmonary vein drainage into the  left atrium.   Normal left atrial appendage without a thrombus.   Normal size of the pulmonary artery.   IMPRESSION: 1. No evidence of CAD, CADRADS = 0. There is a single focus of calcium in the proximal LAD, but it does not obstruct the lumen at all.   2. Coronary calcium score of 6. This was 18 percentile for age and sex matched control.   3. Normal coronary origin with left dominance.  EKG:  EKG is personally reviewed.   05/16/2021: NSR at 74 bpm with IVCD 05/09/2020: NSR at 74 bpm with incomplete RBBB  Recent Labs: 01/25/2021: ALT 35; BUN 22; Creat 1.15; Hemoglobin 11.8; Magnesium 2.0; Platelets 283; Potassium 4.7; Sodium 140 02/28/2021: TSH 2.57   Recent Lipid Panel    Component Value Date/Time   CHOL 129 01/25/2021 1607   TRIG 149 01/25/2021 1607   HDL 53 01/25/2021 1607   CHOLHDL 2.4 01/25/2021 1607   VLDL 25 03/24/2017 1643   LDLCALC 53 01/25/2021 1607    Physical Exam:    VS:  BP 94/65   Pulse 74   Ht 5' 3"  (1.6 m)   Wt 203 lb (92.1 kg)   BMI 35.96 kg/m     Wt Readings from Last 3 Encounters:  05/16/21 203 lb (92.1 kg)  01/25/21 222 lb (100.7 kg)  01/18/21 222 lb (100.7 kg)    GEN: Well nourished, well developed in no acute distress HEENT: Normal, moist mucous membranes NECK: No JVD CARDIAC: regular rhythm, normal S1 and S2, no rubs or gallops. No murmur. VASCULAR: Radial and DP pulses 2+ bilaterally. No carotid bruits RESPIRATORY:  Clear to auscultation without rales, wheezing or rhonchi  ABDOMEN: Soft, non-tender, non-distended MUSCULOSKELETAL:  Ambulates independently SKIN: Warm and dry, no edema NEUROLOGIC:  Alert and oriented x 3. No focal neuro deficits noted. PSYCHIATRIC:  Normal affect    ASSESSMENT:    1. Essential hypertension   2. Precordial pain   3. Hyperlipidemia, unspecified hyperlipidemia type   4. Type 2 diabetes mellitus without complication, without long-term current use of insulin (HCC)   5. Cardiac risk counseling   6.  Counseling on health promotion and disease prevention     PLAN:    Chest pain: -similar to prior, atypical, localized, nonexertional -reviewed prior CT results, reassuring that it is unlikely  to be cardiac -did discuss red flag warning signs that need immediate medical attention  Coronary calcification: trivial, no obstruction. Prevention as below -discussed aspirin given CAC, diabetes. She is amenable. Will monitor for bleeding  Hypertension:  -running low today, but has been vomiting, poor oral intake since increasing Ozempic dose -recommend she hold her furosemide and benicar until she is taking in and tolerating her normal food/drink, then can restart -has follow up pending with her PCP, recommend she discuss side effects/Ozempic dose at that time. -if weight loss continues, may need to decrease antihypertensives. Would start with amlodipine.  Hyperlipidemia: -continue rosuvastatin -per [facility], lipids 01/25/21: Tchol 129, HDL 53, LDL 53, TG 149 (unknown if fasting)  Type II diabetes, not on insulin: -currently on metformin, semaglutide -starting aspirin, as above  Obesity: BMI 35 -losing weight on semaglutide  Cardiac risk counseling and prevention recommendations: with family history of heart disease -recommend heart healthy/Mediterranean diet, with whole grains, fruits, vegetable, fish, lean meats, nuts, and olive oil. Limit salt. -recommend moderate walking, 3-5 times/week for 30-50 minutes each session. Aim for at least 150 minutes.week. Goal should be pace of 3 miles/hours, or walking 1.5 miles in 30 minutes -recommend avoidance of tobacco products. Avoid excess alcohol. -ASCVD risk score: The ASCVD Risk score (Arnett DK, et al., 2019) failed to calculate for the following reasons:   The valid total cholesterol range is 130 to 320 mg/dL    Plan for follow up: One year or sooner as needed.  Buford Dresser, MD, PhD [place]  [facility]    Medication  Adjustments/Labs and Tests Ordered: Current medicines are reviewed at length with the patient today.  Concerns regarding medicines are outlined above.   Orders Placed This Encounter  Procedures   EKG 12-Lead    Meds ordered this encounter  Medications   aspirin EC 81 MG tablet    Sig: Take 1 tablet (81 mg total) by mouth daily. Swallow whole.    Dispense:  90 tablet    Refill:  3    Patient Instructions  Medication Instructions:  Start: Aspirin 81 mg daily Temporarily hold lasix and olmersartan until able to eat and drink regularly.   *If you need a refill on your cardiac medications before your next appointment, please call your pharmacy*   Lab Work: None ordered today   Testing/Procedures: None ordered today   Follow-Up: At [facility], you and your health needs are our priority.  As part of our continuing mission to provide you with exceptional heart care, we have created designated Provider Care Teams.  These Care Teams include your primary Cardiologist (physician) and Advanced Practice Providers (APPs -  Physician Assistants and Nurse Practitioners) who all work together to provide you with the care you need, when you need it.  We recommend signing up for the patient portal called "MyChart".  Sign up information is provided on this After Visit Summary.  MyChart is used to connect with patients for Virtual Visits (Telemedicine).  Patients are able to view lab/test results, encounter notes, upcoming appointments, etc.  Non-urgent messages can be sent to your provider as well.   To learn more about what you can do with MyChart, go to [email or web address].    Your next appointment:   1 year(s)  The format for your next appointment:   In Person  Provider:   Buford Dresser, MD     [facility],acting as a scribe for Buford Dresser, MD.,have documented all relevant documentation on the behalf of  Buford Dresser, MD,as directed by  Buford Dresser, MD while in the presence of Buford Dresser, MD.  I, Buford Dresser, MD, have reviewed all documentation for this visit. The documentation on 05/16/21 for the exam, diagnosis, procedures, and orders are all accurate and complete.   Signed, Buford Dresser, MD PhD 05/16/2021  [place]

## 2021-05-16 ENCOUNTER — Other Ambulatory Visit: Payer: Self-pay

## 2021-05-16 ENCOUNTER — Encounter (HOSPITAL_BASED_OUTPATIENT_CLINIC_OR_DEPARTMENT_OTHER): Payer: Self-pay | Admitting: Cardiology

## 2021-05-16 ENCOUNTER — Ambulatory Visit (INDEPENDENT_AMBULATORY_CARE_PROVIDER_SITE_OTHER): Payer: 59 | Admitting: Cardiology

## 2021-05-16 VITALS — BP 94/65 | HR 74 | Ht 63.0 in | Wt 203.0 lb

## 2021-05-16 DIAGNOSIS — I10 Essential (primary) hypertension: Secondary | ICD-10-CM | POA: Diagnosis not present

## 2021-05-16 DIAGNOSIS — Z7189 Other specified counseling: Secondary | ICD-10-CM

## 2021-05-16 DIAGNOSIS — R072 Precordial pain: Secondary | ICD-10-CM

## 2021-05-16 DIAGNOSIS — E119 Type 2 diabetes mellitus without complications: Secondary | ICD-10-CM | POA: Diagnosis not present

## 2021-05-16 DIAGNOSIS — E785 Hyperlipidemia, unspecified: Secondary | ICD-10-CM

## 2021-05-16 MED ORDER — ASPIRIN EC 81 MG PO TBEC: 81.0000 mg | DELAYED_RELEASE_TABLET | Freq: Every day | ORAL | 3 refills | Status: AC

## 2021-05-16 NOTE — Patient Instructions (Signed)
Medication Instructions:  Start: Aspirin 81 mg daily Temporarily hold lasix and olmersartan until able to eat and drink regularly.   *If you need a refill on your cardiac medications before your next appointment, please call your pharmacy*   Lab Work: None ordered today   Testing/Procedures: None ordered today   Follow-Up: At [facility], you and your health needs are our priority.  As part of our continuing mission to provide you with exceptional heart care, we have created designated Provider Care Teams.  These Care Teams include your primary Cardiologist (physician) and Advanced Practice Providers (APPs -  Physician Assistants and Nurse Practitioners) who all work together to provide you with the care you need, when you need it.  We recommend signing up for the patient portal called "MyChart".  Sign up information is provided on this After Visit Summary.  MyChart is used to connect with patients for Virtual Visits (Telemedicine).  Patients are able to view lab/test results, encounter notes, upcoming appointments, etc.  Non-urgent messages can be sent to your provider as well.   To learn more about what you can do with MyChart, go to [email or web address].    Your next appointment:   1 year(s)  The format for your next appointment:   In Person  Provider:   Buford Dresser, MD

## 2021-06-04 ENCOUNTER — Ambulatory Visit: Payer: 59 | Admitting: Adult Health

## 2021-06-07 NOTE — Progress Notes (Signed)
FOLLOW UP  Assessment and Plan:   Kristina Webb was seen today for follow-up.  Diagnoses and all orders for this visit:  Essential hypertension -     CBC with Differential/Platelet -     Microalbumin / creatinine urine ratio -     amLODipine (NORVASC) 5 MG tablet; Take 1 tablet (5 mg total) by mouth daily. --  continue medications, DASH diet, exercise and monitor at home. Call if greater than 130/80.  Keep BP log and follow up in 3 months   Major depressive disorder in partial remission, unspecified whether recurrent ([place])  Continue Zoloft, behavior modification  Hypothyroidism, unspecified type  TSH  Continue current medication and will adjust dose pending lab results  Please take your thyroid medication greater than 30 min before breakfast, separated by at least 4 hours  from antacids, calcium, iron, and multivitamins.   Gastroesophageal reflux disease with esophagitis, unspecified whether hemorrhage  Continue Protonix and behavior modification  Monitor Symptoms  Obesity, morbid (HCC)  Continue Ozempic 0.5 mg, diet and encouraged more exercise.  OSA (obstructive sleep apnea)  Strongly encouraged use of CPAP for sleep apnea.  Advised hopefully use of daily Judithann Sauger will help with cough she has at bedtime.  Also advised pt to have husband smoke outside.   Abnormal glucose -     Hemoglobin A1c -     Semaglutide,0.25 or 0.5MG /DOS, 2 MG/1.5ML SOPN; Inject 0.5 mg into the skin once a week. - Continue diet and exercise  Stage 3a chronic kidney disease (HCC) -     COMPLETE METABOLIC PANEL WITH GFR - Strongly encouraged to push fluids throughout the day  Mixed hyperlipidemia -     Lipid panel -     TSH - Continue medication, diet and exercise  Medication management  Continued  Uncomplicated asthma, unspecified asthma severity, unspecified whether persistent -     Budeson-Glycopyrrol-Formoterol (BREZTRI AEROSPHERE) 160-9-4.8 MCG/ACT AERO; Inhale 2 puffs into the lungs in the  morning and at bedtime. Encouraged to use daily inhaler regularly as well as singulair daily Use Albuterol as needed  Flu vaccine need -     Flu Vaccine QUAD 6+ mos PF IM (Fluarix Quad PF)    Continue diet and meds as discussed. Further disposition pending results of labs. Discussed med's effects and SE's.   Over 30 minutes of exam, counseling, chart review, and critical decision making was performed.   Future Appointments  Date Time Provider [place]  01/23/2022  2:00 PM Ward Givens, NP [facility] None  01/28/2022  3:00 PM Liane Comber, NP [facility] None    ----------------------------------------------------------------------------------------------------------------------  HPI 65 y.o. female  presents for 3 month follow up on hypertension, cholesterol, diabetes, weight and vitamin D deficiency.  Home Sleep study 09/13/20 and was prescribed auto CPAP machine and was seen by Dr. Brett Fairy. Has not been using her CPAP regularly, she has coughing fits when using.    She has hx of asthma, was to be on Breo daily but hasn't taken for quite a long time due to cost.  She is also on singulair.  Patient does have a persistent cough which is mostly at night when lying flat    BMI is Body mass index is 36.28 kg/m., she has been working on diet, admits exercise is limited, was walking 2 miles most days but has gotten out of the habit since working from home.  Wt Readings from Last 3 Encounters:  06/11/21 204 lb 12.8 oz (92.9 kg)  05/16/21 203 lb (92.1 kg)  01/25/21 222 lb (100.7 kg)   She does not check BP due to typically well controlled, reports under increased stress due to husband's health, today their BP is BP: 100/62  She does not workout. She denies chest pain, shortness of breath, dizziness.   She is on cholesterol medication (rosuvastatin 10 mg three times weekly) and denies myalgias. Her cholesterol is not at goal. The cholesterol last visit was:   Lab Results   Component Value Date   CHOL 129 01/25/2021   HDL 53 01/25/2021   LDLCALC 53 01/25/2021   TRIG 149 01/25/2021   CHOLHDL 2.4 01/25/2021    She has been working on diet and exercise for T2 diabetes on metformin, and denies foot ulcerations, increased appetite, nausea, paresthesia of the feet, polydipsia, polyuria, visual disturbances, vomiting and weight loss.  She reports doesn't have glucometer; will check occasionally using husband's supplies Last A1C in the office was: She is currently on Ozempic 0.5 mg , threw up on the 1 mg dosage.  Lab Results  Component Value Date   HGBA1C 6.4 (H) 01/25/2021    She has stable CKD II:  Lab Results  Component Value Date   GFRNONAA 51 (L) 01/25/2021   She is on thyroid medication, she states she is very tired and constipated. Her medication was not changed last visit.  No biotin. Taking 75 mcg daily at night (has done so for many years) daily except Sunday take 1 1/2 tab Lab Results  Component Value Date   TSH 2.57 02/28/2021   Patient is on Vitamin D supplement, taking 50000 IU twice weekly since last visit, endorses improved mental clarity, improved energy  Lab Results  Component Value Date   VD25OH 94 01/25/2021     Pt had mole removed from her back by dermatologist, had mohs procedure- precancerous. Followed by dermatology.  Current Medications:  Current Outpatient Medications on File Prior to Visit  Medication Sig   acetaminophen (TYLENOL) 500 MG tablet Take 1,000 mg by mouth every 6 (six) hours as needed for mild pain, moderate pain or headache.   albuterol (PROAIR HFA) 108 (90 Base) MCG/ACT inhaler Inhale 2 puffs into the lungs every 6 (six) hours as needed for wheezing.   amitriptyline (ELAVIL) 10 MG tablet Take 1 tablet (10 mg total) by mouth at bedtime as needed.   amLODipine (NORVASC) 10 MG tablet TAKE 1 TABLET DAILY FOR BLOOD PRESSURE   aspirin EC 81 MG tablet Take 1 tablet (81 mg total) by mouth daily. Swallow whole.    bisoprolol (ZEBETA) 10 MG tablet TAKE 1 TABLET DAILY FOR BLOOD PRESSURE   fluticasone furoate-vilanterol (BREO ELLIPTA) 100-25 MCG/INH AEPB Inhale 1 puff into the lungs daily. Rinse mouth after use.   furosemide (LASIX) 40 MG tablet Take  1 tablet  Daily  for BP & Fluid Retention / Ankle Swelling   levothyroxine (SYNTHROID) 75 MCG tablet TAKE 1 TABLET BY MOUTH EVERY DAY ON EMPTY STOMACH WITH WATER. NO ANTACID MEDS FOR 30 MINS   metFORMIN (GLUCOPHAGE-XR) 500 MG 24 hr tablet Take 1 tablet (500 mg total) by mouth 2 (two) times daily.   montelukast (SINGULAIR) 10 MG tablet Take  1 tablet  Daily  for Allergies   olmesartan (BENICAR) 40 MG tablet TAKE 1 TABLET DAILY FOR BP & DIABETIC KIDNEY PROTECTION   pantoprazole (PROTONIX) 40 MG tablet TAKE 1 TABLET DAILY FOR HEARTBURN & INDIGESTION   rosuvastatin (CRESTOR) 10 MG tablet Take     1 tablet    3  x /week       on Mon Wed Fri   Semaglutide, 1 MG/DOSE, (OZEMPIC, 1 MG/DOSE,) 4 MG/3ML SOPN Inject 1 mg into the skin once a week.   sertraline (ZOLOFT) 100 MG tablet TAKE 1 TABLET DAILY FOR DEPRESSION/ANXIETY   Vitamin D, Ergocalciferol, (DRISDOL) 1.25 MG (50000 UNIT) CAPS capsule TAKE 1 CAPSULE BY MOUTH 2 DAYS A WEEK   benzonatate (TESSALON) 200 MG capsule Take 1 capsule (200 mg total) by mouth 3 (three) times daily as needed for cough (Max: 600mg  per day). Take  1 capsule  3 x /day  to Prevent Cough (Patient not taking: Reported on 06/11/2021)   Blood Glucose Monitoring Suppl DEVI Use to test blood sugar once daily. Dx: E11.22, N18.2   FREESTYLE LITE test strip USE TO CHECK BLOOD SUGAR ONCE DAILY. DX: E11.22, N18.2   Lancets MISC Use to check blood sugar once daily. Dx: E11.22, N18.2   promethazine-dextromethorphan (PROMETHAZINE-DM) 6.25-15 MG/5ML syrup Take 1 teaspoonful every 4 hours as needed for cough (Patient not taking: Reported on 06/11/2021)   No current facility-administered medications on file prior to visit.     Allergies:  Allergies  Allergen  Reactions   Levaquin [Levofloxacin In D5w] Other (See Comments)    Ankles hurt really bad ? ACHILLES TENDON ?   Doxycycline Other (See Comments)    UNSPECIFIED REACTION      Medical History:  Past Medical History:  Diagnosis Date   Anemia    hx   Anxiety    Arthritis    back   Asthma    Depression    Deviated septum    GERD (gastroesophageal reflux disease)    Headache    History of hiatal hernia    Hyperlipemia    Hypertension    Hypothyroidism    OSA (obstructive sleep apnea)    does not use -last 6 months   Pre-diabetes    per pt   Family history- Reviewed and unchanged Social history- Reviewed and unchanged   Review of Systems:  Review of Systems  Constitutional:  Negative for malaise/fatigue and weight loss.  HENT:  Negative for hearing loss and tinnitus.   Eyes:  Negative for blurred vision and double vision.  Respiratory:  Positive for cough. Negative for sputum production, shortness of breath and wheezing.   Cardiovascular:  Negative for chest pain, palpitations, orthopnea, claudication and leg swelling.  Gastrointestinal:  Negative for abdominal pain, blood in stool, constipation, diarrhea, heartburn, melena, nausea and vomiting.  Genitourinary: Negative.   Musculoskeletal:  Negative for joint pain and myalgias.  Skin:  Negative for rash.  Neurological:  Negative for dizziness, tingling, sensory change, weakness and headaches.  Endo/Heme/Allergies:  Negative for polydipsia.  Psychiatric/Behavioral:  Negative for depression and substance abuse. The patient is not nervous/anxious and does not have insomnia.   All other systems reviewed and are negative.   Physical Exam: BP 100/62   Pulse 82   Temp (!) 97.2 F (36.2 C)   Wt 204 lb 12.8 oz (92.9 kg)   SpO2 96%   BMI 36.28 kg/m  Wt Readings from Last 3 Encounters:  06/11/21 204 lb 12.8 oz (92.9 kg)  05/16/21 203 lb (92.1 kg)  01/25/21 222 lb (100.7 kg)   General Appearance: Well nourished, in no  apparent distress. Eyes: PERRLA, EOMs, conjunctiva no swelling or erythema Sinuses: No Frontal/maxillary tenderness ENT/Mouth: Ext aud canals clear, TMs without erythema, bulging. No erythema, swelling, or exudate on post pharynx.  Tonsils not swollen  or erythematous. Hearing normal.  Neck: Supple, thyroid normal.  Respiratory: Respiratory effort normal, BS equal bilaterally without rales, rhonchi, wheezing or stridor.  Cardio: RRR with no MRGs. Brisk peripheral pulses without edema.  Abdomen: Soft, + BS,  mild epigastric tenderness, no guarding, rebound, hernias, masses. Lymphatics: Non tender without lymphadenopathy.  Musculoskeletal: Full ROM, 5/5 strength, Normal gait,  Skin: Port wine stain right face. Warm, dry without rashes, lesions, ecchymosis.  Neuro: Cranial nerves intact. Normal muscle tone, no cerebellar symptoms. Psych: Awake and oriented X 3, normal affect, Insight and Judgment appropriate   Takeria Marquina Kathyrn Drown, NP 3:46 PM [facility]

## 2021-06-11 ENCOUNTER — Other Ambulatory Visit: Payer: Self-pay

## 2021-06-11 ENCOUNTER — Ambulatory Visit (INDEPENDENT_AMBULATORY_CARE_PROVIDER_SITE_OTHER): Payer: 59 | Admitting: Nurse Practitioner

## 2021-06-11 ENCOUNTER — Encounter: Payer: Self-pay | Admitting: Nurse Practitioner

## 2021-06-11 VITALS — BP 100/62 | HR 82 | Temp 97.2°F | Wt 204.8 lb

## 2021-06-11 DIAGNOSIS — E039 Hypothyroidism, unspecified: Secondary | ICD-10-CM | POA: Diagnosis not present

## 2021-06-11 DIAGNOSIS — I10 Essential (primary) hypertension: Secondary | ICD-10-CM | POA: Diagnosis not present

## 2021-06-11 DIAGNOSIS — K2100 Gastro-esophageal reflux disease with esophagitis, without bleeding: Secondary | ICD-10-CM

## 2021-06-11 DIAGNOSIS — J45909 Unspecified asthma, uncomplicated: Secondary | ICD-10-CM

## 2021-06-11 DIAGNOSIS — G4733 Obstructive sleep apnea (adult) (pediatric): Secondary | ICD-10-CM

## 2021-06-11 DIAGNOSIS — R7309 Other abnormal glucose: Secondary | ICD-10-CM

## 2021-06-11 DIAGNOSIS — Z79899 Other long term (current) drug therapy: Secondary | ICD-10-CM

## 2021-06-11 DIAGNOSIS — E782 Mixed hyperlipidemia: Secondary | ICD-10-CM | POA: Diagnosis not present

## 2021-06-11 DIAGNOSIS — N1831 Chronic kidney disease, stage 3a: Secondary | ICD-10-CM

## 2021-06-11 DIAGNOSIS — Z23 Encounter for immunization: Secondary | ICD-10-CM

## 2021-06-11 DIAGNOSIS — F324 Major depressive disorder, single episode, in partial remission: Secondary | ICD-10-CM | POA: Diagnosis not present

## 2021-06-11 MED ORDER — AMLODIPINE BESYLATE 5 MG PO TABS: 5.0000 mg | ORAL_TABLET | Freq: Every day | ORAL | 11 refills | Status: DC

## 2021-06-11 MED ORDER — SEMAGLUTIDE(0.25 OR 0.5MG/DOS) 2 MG/1.5ML ~~LOC~~ SOPN: 0.5000 mg | PEN_INJECTOR | SUBCUTANEOUS | 6 refills | Status: DC

## 2021-06-11 MED ORDER — BREZTRI AEROSPHERE 160-9-4.8 MCG/ACT IN AERO
2.0000 | INHALATION_SPRAY | Freq: Two times a day (BID) | RESPIRATORY_TRACT | 2 refills | Status: DC
Start: 2021-06-11 — End: 2021-10-16

## 2021-06-11 NOTE — Patient Instructions (Addendum)
Take 1/2 tab of Amlodipine 10 mg daily. Check blood pressure and keep BP log.   Breztri 2 puffs twice a day  Use CPAP as much as possible

## 2021-06-12 LAB — CBC WITH DIFFERENTIAL/PLATELET
Absolute Monocytes: 643 cells/uL (ref 200–950)
Basophils Absolute: 60 cells/uL (ref 0–200)
Basophils Relative: 0.5 %
Eosinophils Absolute: 869 cells/uL — ABNORMAL HIGH (ref 15–500)
Eosinophils Relative: 7.3 %
HCT: 38.6 % (ref 35.0–45.0)
Hemoglobin: 12.5 g/dL (ref 11.7–15.5)
Lymphs Abs: 3082 cells/uL (ref 850–3900)
MCH: 27.5 pg (ref 27.0–33.0)
MCHC: 32.4 g/dL (ref 32.0–36.0)
MCV: 85 fL (ref 80.0–100.0)
MPV: 10.2 fL (ref 7.5–12.5)
Monocytes Relative: 5.4 %
Neutro Abs: 7247 cells/uL (ref 1500–7800)
Neutrophils Relative %: 60.9 %
Platelets: 318 10*3/uL (ref 140–400)
RBC: 4.54 10*6/uL (ref 3.80–5.10)
RDW: 13.7 % (ref 11.0–15.0)
Total Lymphocyte: 25.9 %
WBC: 11.9 10*3/uL — ABNORMAL HIGH (ref 3.8–10.8)

## 2021-06-12 LAB — COMPLETE METABOLIC PANEL WITH GFR
AG Ratio: 1.4 (calc) (ref 1.0–2.5)
ALT: 25 U/L (ref 6–29)
AST: 25 U/L (ref 10–35)
Albumin: 4.4 g/dL (ref 3.6–5.1)
Alkaline phosphatase (APISO): 69 U/L (ref 37–153)
BUN/Creatinine Ratio: 22 (calc) (ref 6–22)
BUN: 26 mg/dL — ABNORMAL HIGH (ref 7–25)
CO2: 30 mmol/L (ref 20–32)
Calcium: 10.1 mg/dL (ref 8.6–10.4)
Chloride: 100 mmol/L (ref 98–110)
Creat: 1.17 mg/dL — ABNORMAL HIGH (ref 0.50–1.05)
Globulin: 3.1 g/dL (calc) (ref 1.9–3.7)
Glucose, Bld: 98 mg/dL (ref 65–99)
Potassium: 4.6 mmol/L (ref 3.5–5.3)
Sodium: 139 mmol/L (ref 135–146)
Total Bilirubin: 0.3 mg/dL (ref 0.2–1.2)
Total Protein: 7.5 g/dL (ref 6.1–8.1)
eGFR: 52 mL/min/{1.73_m2} — ABNORMAL LOW (ref >=60)

## 2021-06-12 LAB — LIPID PANEL
Cholesterol: 139 mg/dL (ref <200)
HDL: 50 mg/dL (ref >=50)
LDL Cholesterol (Calc): 64 mg/dL (calc)
Non-HDL Cholesterol (Calc): 89 mg/dL (calc) (ref <130)
Total CHOL/HDL Ratio: 2.8 (calc) (ref <5.0)
Triglycerides: 173 mg/dL — ABNORMAL HIGH (ref <150)

## 2021-06-12 LAB — HEMOGLOBIN A1C
Hgb A1c MFr Bld: 5.8 % of total Hgb — ABNORMAL HIGH (ref <5.7)
Mean Plasma Glucose: 120 mg/dL
eAG (mmol/L): 6.6 mmol/L

## 2021-06-12 LAB — MICROALBUMIN / CREATININE URINE RATIO
Creatinine, Urine: 51 mg/dL (ref 20–275)
Microalb Creat Ratio: 4 mcg/mg creat (ref <30)
Microalb, Ur: 0.2 mg/dL

## 2021-06-12 LAB — TSH: TSH: 2.02 mIU/L (ref 0.40–4.50)

## 2021-06-13 ENCOUNTER — Other Ambulatory Visit: Payer: Self-pay | Admitting: Adult Health

## 2021-06-13 DIAGNOSIS — I10 Essential (primary) hypertension: Secondary | ICD-10-CM

## 2021-06-28 ENCOUNTER — Encounter: Payer: Self-pay | Admitting: Internal Medicine

## 2021-06-29 ENCOUNTER — Encounter (HOSPITAL_COMMUNITY): Payer: Self-pay

## 2021-06-29 ENCOUNTER — Other Ambulatory Visit: Payer: Self-pay

## 2021-06-29 ENCOUNTER — Ambulatory Visit (HOSPITAL_COMMUNITY)
Admission: EM | Admit: 2021-06-29 | Discharge: 2021-06-29 | Disposition: A | Discharge status: Home / Self Care | Payer: 59 | Attending: Physician Assistant | Admitting: Physician Assistant

## 2021-06-29 DIAGNOSIS — B029 Zoster without complications: Secondary | ICD-10-CM | POA: Diagnosis not present

## 2021-06-29 MED ORDER — VALACYCLOVIR HCL 1 G PO TABS: 1000.0000 mg | ORAL_TABLET | Freq: Three times a day (TID) | ORAL | 0 refills | Status: AC

## 2021-06-29 MED ORDER — PREDNISONE 20 MG PO TABS: 40.0000 mg | ORAL_TABLET | Freq: Every day | ORAL | 0 refills | Status: DC

## 2021-06-29 MED ORDER — TETRACAINE HCL 0.5 % OP SOLN
OPHTHALMIC | Status: AC
  Filled 2021-06-29: qty 4

## 2021-06-29 NOTE — ED Triage Notes (Signed)
Pt presents with c/o a rash on her face. States it has been going on for 6 days. States the rash is hot to the touch and states her Dermatologist is concerned about Shingles.   States she has itching around her L ear and eye. Pt expresses using benadryl, aspirin and advil along with cold compresses for relief.

## 2021-06-29 NOTE — ED Provider Notes (Signed)
[facility]    CSN: [id number] Arrival date & time: 06/29/21  1440      History   Chief Complaint Chief Complaint  Patient presents with   Rash    HPI Kristina Webb is a 65 y.o. female.   Patient presents today with a 4 to 5-day history of rash on the left side of her face.  She reports this initially was itchy and she assumed it was dry skin complied medication without improvement of symptoms.  She reports itching has improved she is now experiencing a burning/tender sensation surrounding her left eye and on her forehead.  She reports pain radiates towards her ear.  She does have a history of shingles approximately 20 years ago on the right leg but has not had additional episodes since that time.  She has not had a shingles vaccine.  She reports feeling poorly with generalized fatigue but denies specific symptoms including shortness of breath, chest pain, cough, nasal congestion.  Denies any changes to personal hygiene products.  She does report irritation of her left eye but denies any visual changes.   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   Insomnia due to other mental disorder 09/18/2020   Severe obstructive sleep apnea-hypopnea syndrome 09/18/2020   Exposure to second hand tobacco smoke 09/18/2020   Family history of heart disease 05/09/2020   Obesity hypoventilation syndrome ([place]) 08/05/2019   Lower back pain 07/17/2015    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   Class 2 severe obesity due to excess calories with serious comorbidity and body mass index (BMI) of 39.0 to 39.9 in adult ([place]) 03/29/2015   CKD stage 3 due to type 2 diabetes mellitus ([place]) 04/15/2014   Hyperlipidemia associated with type 2 diabetes mellitus ([place])    Type 2 diabetes mellitus ([place])    Asthma    Major depression in partial remission ([place])    OSA (obstructive sleep apnea)    Hypertension    GERD (gastroesophageal reflux disease)    Cough 12/28/2012    Past Surgical History:  Procedure Laterality Date   ANKLE GANGLION CYST EXCISION Left    BACK SURGERY     birth mark treatments     BREAST LUMPECTOMY WITH RADIOACTIVE SEED LOCALIZATION Left 02/02/2016   Procedure: LEFT BREAST LUMPECTOMY WITH RADIOACTIVE SEED LOCALIZATION;  Surgeon: Autumn Messing III, MD;  Location: [facility];  Service: General;  Laterality: Left;   BREAST SURGERY Right    papilloma removed,cyst lft   CATARACT EXTRACTION Right 12/2016   Dr. Harlow Mares   CHOLECYSTECTOMY N/A 02/20/2017   Procedure: LAPAROSCOPIC CHOLECYSTECTOMY WITH INTRAOPERATIVE CHOLANGIOGRAM;  Surgeon: Jovita Kussmaul, MD;  Location: [facility];  Service: General;  Laterality: N/A;   COLON SURGERY  2008   resection- endometriosis  OB History   No obstetric history on file.      Home Medications    Prior to Admission medications   Medication Sig Start Date End Date Taking? Authorizing Provider  predniSONE (DELTASONE) 20 MG tablet Take 2 tablets (40 mg total) by mouth daily for 5 days. 06/29/21 07/04/21 Yes Emry Barbato, Derry Skill, PA-C  valACYclovir (VALTREX) 1000 MG tablet Take 1 tablet (1,000 mg total) by mouth 3 (three) times daily for 7 days. 06/29/21 07/06/21 Yes Gracelynn Bircher, Derry Skill, PA-C  acetaminophen (TYLENOL) 500 MG tablet Take 1,000 mg by mouth every 6 (six) hours as needed for mild pain, moderate pain or headache.    [provider]  albuterol (PROAIR HFA) 108 (90 Base) MCG/ACT inhaler Inhale 2 puffs into the  lungs every 6 (six) hours as needed for wheezing. 03/15/20   Vladimir Crofts, PA-C  amitriptyline (ELAVIL) 10 MG tablet Take 1 tablet (10 mg total) by mouth at bedtime as needed. 03/14/20   Vladimir Crofts, PA-C  amLODipine (NORVASC) 5 MG tablet Take 1 tablet (5 mg total) by mouth daily. 06/11/21 06/11/22  Magda Bernheim, NP  aspirin EC 81 MG tablet Take 1 tablet (81 mg total) by mouth daily. Swallow whole. 05/16/21   Buford Dresser, MD  benzonatate (TESSALON) 200 MG capsule Take 1 capsule (200 mg total) by mouth 3 (three) times daily as needed for cough (Max: 600mg  per day). Take  1 capsule  3 x /day  to Prevent Cough Patient not taking: Reported on 06/11/2021 01/01/21   Unk Pinto, MD  bisoprolol (ZEBETA) 10 MG tablet TAKE 1 TABLET DAILY FOR BLOOD PRESSURE 09/06/20   Unk Pinto, MD  Blood Glucose Monitoring Suppl DEVI Use to test blood sugar once daily. Dx: E11.22, N18.2 08/09/19   Liane Comber, NP  Budeson-Glycopyrrol-Formoterol (BREZTRI AEROSPHERE) 160-9-4.8 MCG/ACT AERO Inhale 2 puffs into the lungs in the morning and at bedtime. 06/11/21   Magda Bernheim, NP  FREESTYLE LITE test strip USE TO CHECK BLOOD SUGAR ONCE DAILY. DX: E11.22, N18.2 09/20/20   Liane Comber, NP  furosemide (LASIX) 40 MG tablet Take  1 tablet  Daily  for BP & Fluid Retention / Ankle Swelling 03/01/21   Unk Pinto, MD  Lancets MISC Use to check blood sugar once daily. Dx: E11.22, N18.2 08/09/19   Liane Comber, NP  levothyroxine (SYNTHROID) 75 MCG tablet TAKE 1 TABLET BY MOUTH EVERY DAY ON EMPTY STOMACH WITH WATER. NO ANTACID MEDS FOR 30 MINS 12/26/20   Unk Pinto, MD  metFORMIN (GLUCOPHAGE-XR) 500 MG 24 hr tablet Take 1 tablet (500 mg total) by mouth 2 (two) times daily. 03/14/20   Vladimir Crofts, PA-C  montelukast (SINGULAIR) 10 MG tablet Take  1 tablet  Daily  for Allergies 12/16/20   Unk Pinto, MD  olmesartan (BENICAR) 40 MG tablet TAKE 1 TABLET DAILY FOR BP & DIABETIC KIDNEY  PROTECTION 12/16/20   Liane Comber, NP  pantoprazole (PROTONIX) 40 MG tablet TAKE 1 TABLET DAILY FOR HEARTBURN & INDIGESTION 12/16/20   Liane Comber, NP  rosuvastatin (CRESTOR) 10 MG tablet Take     1 tablet    3 x /week       on Mon Wed Fri 06/07/20   Unk Pinto, MD  Semaglutide,0.25 or 0.5MG /DOS, 2 MG/1.5ML SOPN Inject 0.5 mg into the skin once a week. 06/11/21   Magda Bernheim, NP  sertraline (ZOLOFT) 100 MG tablet TAKE 1 TABLET DAILY FOR DEPRESSION/ANXIETY 01/25/21   Liane Comber, NP  Vitamin D, Ergocalciferol, (DRISDOL) 1.25 MG (50000 UNIT) CAPS capsule TAKE 1 CAPSULE BY MOUTH 2 DAYS A WEEK 04/19/21   Liane Comber, NP    Family History Family History  Problem Relation Age of Onset   Emphysema Mother        smoked   Allergies Mother    Asthma Mother    Heart disease Mother    Breast cancer Mother        with mets to Bone   Prostate cancer Father    Brain cancer Father    Hypertension Brother    Colon cancer Paternal Grandmother    Allergic rhinitis Neg Hx    Angioedema Neg Hx    Eczema Neg Hx    Immunodeficiency Neg Hx    Urticaria Neg Hx     Social History Social History   Tobacco Use   Smoking status: Never   Smokeless tobacco: Never   Tobacco comments:    husband smokes in the house  Vaping Use   Vaping Use: Never used  Substance Use Topics   Alcohol use: No   Drug use: No     Allergies   Levaquin [levofloxacin in d5w] and Doxycycline   Review of Systems Review of Systems  Constitutional:  Positive for activity change and fatigue. Negative for appetite change and fever.  Eyes:  Negative for photophobia and visual disturbance.  Respiratory:  Negative for cough and shortness of breath.   Cardiovascular:  Negative for chest pain.  Gastrointestinal:  Negative for abdominal pain, diarrhea, nausea and vomiting.  Skin:  Positive for rash. Negative for color change.  Neurological:  Negative for dizziness, light-headedness and headaches.     Physical Exam Triage Vital Signs ED Triage Vitals  Enc Vitals Group     BP 06/29/21 1622 100/69     Pulse Rate 06/29/21 1622 73     Resp 06/29/21 1622 19     Temp 06/29/21 1622 97.8 F (36.6 C)     Temp Source 06/29/21 1622 Oral     SpO2 06/29/21 1622 98 %     Weight --      Height --      Head Circumference --      Peak Flow --      Pain Score 06/29/21 1621 5     Pain Loc --      Pain Edu? --      Excl. in [place]? --    No data found.  Updated Vital Signs BP 100/69 (BP Location: Right Arm)   Pulse 73   Temp 97.8 F (36.6 C) (Oral)   Resp 19   SpO2 98%   Visual Acuity Right Eye Distance:   Left Eye Distance:   Bilateral Distance:    Right Eye Near:   Left Eye Near:    Bilateral Near:     Physical Exam Vitals reviewed.  Constitutional:      General: She is awake. She is not in acute distress.    Appearance: Normal appearance. She is well-developed. She is not ill-appearing.     Comments: Very pleasant female appears stated age no acute distress sitting comfortably in exam room  HENT:     Head: Normocephalic and atraumatic.  Eyes:     Extraocular Movements: Extraocular movements intact.     Conjunctiva/sclera: Conjunctivae normal.     Pupils: Pupils are equal, round, and reactive to light.     Left eye: No corneal abrasion or fluorescein uptake.  Cardiovascular:  Rate and Rhythm: Normal rate and regular rhythm.     Heart sounds: Normal heart sounds, S1 normal and S2 normal. No murmur heard. Pulmonary:     Effort: Pulmonary effort is normal.     Breath sounds: Normal breath sounds. No wheezing, rhonchi or rales.     Comments: Clear to auscultation bilaterally Skin:    Findings: Rash present. Rash is macular and papular.     Comments: Atypical nevi involving right periorbital region.  Maculopapular rash with ulcerated lesions noted left temporal area with spread into scalp.  No vesicles or ulcerative lesions noted on external ear or nose.  Psychiatric:         Behavior: Behavior is cooperative.     UC Treatments / Results  Labs (all labs ordered are listed, but only abnormal results are displayed) Labs Reviewed - No data to display  EKG   Radiology No results found.  Procedures Procedures (including critical care time)  Medications Ordered in UC Medications - No data to display  Initial Impression / Assessment and Plan / UC Course  I have reviewed the triage vital signs and the nursing notes.  Pertinent labs & imaging results that were available during my care of the patient were reviewed by me and considered in my medical decision making (see chart for details).     Concern for shingles given unilateral distribution and characteristics of rash.  No active vesicles noted on exam.  Given V1 distribution will cover for Ramsay Hunt with Valtrex and prednisone.  Patient does have a history of diabetes but her last A1c was appropriate at 5.8% on 06/08/2021.  She was started on 40 mg for 5 days with instruction to avoid carbohydrates and drink plenty of fluid.  She was started on Valtrex with no need for dose adjustment; CMP from 06/11/2021 showed creatinine of 1.17 and calculated creatinine clearance of 70.96 mL/min.  Patient's eye was stained without dendritic pattern or concerning fluorescein uptake.  Discussed that if she has any changes to her vision or ocular pain she needs to go to the emergency room for further evaluation.  Recommend she follow-up with her primary care provider and her first thing next week.  Discussed alarm symptoms that warrant emergent evaluation.  Should return precautions given to which she expressed understanding.  Final Clinical Impressions(s) / UC Diagnoses   Final diagnoses:  Herpes zoster without complication     Discharge Instructions      I am concerned that you have shingles on your face.  Please start Valtrex 1000 mg 3 times a day for 7 days.  Start prednisone burst 40 mg for 5 days.  You  should not take NSAIDs including aspirin, ibuprofen/Advil, naproxen/Aleve with this medication because it can cause stomach bleeding.  This can raise your blood sugar so please avoid carbohydrates and drink plenty of fluid.  If you have any vision changes or increased pain or additional lesions you need to be seen immediately.  Follow-up with your primary care provider as soon as possible for further evaluation go to the emergency room if anything worsens.     ED Prescriptions     Medication Sig Dispense Auth. Provider   valACYclovir (VALTREX) 1000 MG tablet Take 1 tablet (1,000 mg total) by mouth 3 (three) times daily for 7 days. 21 tablet Bowen Kia K, PA-C   predniSONE (DELTASONE) 20 MG tablet Take 2 tablets (40 mg total) by mouth daily for 5 days. 10 tablet Shamal Stracener, Derry Skill, PA-C  PDMP not reviewed this encounter.   Terrilee Croak, PA-C 06/29/21 1728

## 2021-06-29 NOTE — Discharge Instructions (Signed)
I am concerned that you have shingles on your face.  Please start Valtrex 1000 mg 3 times a day for 7 days.  Start prednisone burst 40 mg for 5 days.  You should not take NSAIDs including aspirin, ibuprofen/Advil, naproxen/Aleve with this medication because it can cause stomach bleeding.  This can raise your blood sugar so please avoid carbohydrates and drink plenty of fluid.  If you have any vision changes or increased pain or additional lesions you need to be seen immediately.  Follow-up with your primary care provider as soon as possible for further evaluation go to the emergency room if anything worsens.

## 2021-06-30 ENCOUNTER — Other Ambulatory Visit: Payer: Self-pay | Admitting: Internal Medicine

## 2021-06-30 MED ORDER — ROSUVASTATIN CALCIUM 10 MG PO TABS: ORAL_TABLET | ORAL | 3 refills | Status: DC

## 2021-07-01 ENCOUNTER — Other Ambulatory Visit: Payer: Self-pay | Admitting: Internal Medicine

## 2021-07-01 MED ORDER — ROSUVASTATIN CALCIUM 10 MG PO TABS: ORAL_TABLET | ORAL | 3 refills | Status: DC

## 2021-07-12 ENCOUNTER — Other Ambulatory Visit: Payer: Self-pay | Admitting: Internal Medicine

## 2021-07-12 ENCOUNTER — Encounter: Payer: Self-pay | Admitting: Internal Medicine

## 2021-07-12 DIAGNOSIS — B0222 Postherpetic trigeminal neuralgia: Secondary | ICD-10-CM

## 2021-07-12 MED ORDER — GABAPENTIN 100 MG PO CAPS: ORAL_CAPSULE | ORAL | 0 refills | Status: DC

## 2021-07-23 ENCOUNTER — Telehealth: Payer: Self-pay | Admitting: Neurology

## 2021-07-23 NOTE — Telephone Encounter (Signed)
Pt called, 07/02/21 turned in CPAP machine due to was not using 4 hours day. Currently do not have insurance because [place] is not include in Obamacare. Would like a call from the nurse to discuss getting inspire.

## 2021-07-23 NOTE — Telephone Encounter (Signed)
LVM returning pt call. Advised her most recent BMI in chart showing 36.1. She would not qualify for inspire because patient's have to have BMI of 32 and under. Recommended she call back to schedule a follow up appt to discuss treatment options moving forward. Provided office # for her to call.

## 2021-07-27 LAB — HM MAMMOGRAPHY

## 2021-07-30 ENCOUNTER — Encounter: Payer: Self-pay | Admitting: Neurology

## 2021-07-31 ENCOUNTER — Encounter: Payer: Self-pay | Admitting: Nurse Practitioner

## 2021-08-02 ENCOUNTER — Ambulatory Visit: Payer: 59 | Admitting: Neurology

## 2021-08-02 ENCOUNTER — Encounter: Payer: Self-pay | Admitting: Neurology

## 2021-08-02 VITALS — BP 91/60 | HR 73 | Ht 63.0 in | Wt 200.0 lb

## 2021-08-02 DIAGNOSIS — D229 Melanocytic nevi, unspecified: Secondary | ICD-10-CM

## 2021-08-02 DIAGNOSIS — Q8589 Other phakomatoses, not elsewhere classified: Secondary | ICD-10-CM

## 2021-08-02 DIAGNOSIS — J45991 Cough variant asthma: Secondary | ICD-10-CM

## 2021-08-02 DIAGNOSIS — G4733 Obstructive sleep apnea (adult) (pediatric): Secondary | ICD-10-CM | POA: Diagnosis not present

## 2021-08-02 DIAGNOSIS — J45909 Unspecified asthma, uncomplicated: Secondary | ICD-10-CM | POA: Diagnosis not present

## 2021-08-02 NOTE — Progress Notes (Signed)
SLEEP MED[facility]INE CLINIC    Provider:  Larey Seat, MD  Primary Care Physician:  Unk Pinto, MD [address] [place] [address]     Referring Provider: Unk Pinto, [place],  [place] [address]          Chief Complaint according to patient   Patient presents with:     New Patient (Initial Visit)           HISTORY OF PRESENT ILLNESS:    Kristina Webb is a 65  year old  Caucasian female patient is seen upon a RV  on 08/02/2021 , originally referred by PCP- Dr. Melford Aase, MD.  Chief concern according to patient :   08-02-2021, She had to turn her machine in since she was deemed non compliant. [facility].   She reports that she had to turn the machine and after she got an $2 bill that she turned it in November 4.  Part of her compliance issue was nocturnal coughing.  She also could not use it when she was camping.  She endorsed only 3 points on today's Epworth sleepiness score which is unexpectedly a low degree of sleepiness.  She presents again with low blood pressures she has a tendency to have high poor attention and today's blood pressure was 91/60 mmHg.  I had done a home sleep test with the patient in January 2022 when she had an overall AHI of 41.9/h and REM sleep AHI of 61.3/h minimum oxygen at nadir 77% 9.5 minutes of total hypoxia time.  She reached about 30% REM sleep during that home sleep test.  Her interest in inspire was peaked by a comment of her sister's friend. She is trying to loose weight on Ozempic. She would need to reach 165 pounds to be qualified. Her apnea is severe , hypoxemia is not severe. And her NREM AHI was 30 plus.  So looking at her percentage in rem sleep 30% of the night this is where her highest apnea index he is cluster her non-REM AHI was 34 or approximately there and if we could eliminate her non-REM sleep apnea it would leave her with mild apnea for the rest of the night and not  necessarily cure or completely alleviate her apnea but it may still be of benefit given that she is not able to use the CPAP 4 hours daily.     From Kristina Webb is seen here today on 18 Jan 2021 following a recent home sleep test which was performed in September 13, 2020 it confirmed the presence of severe sleep apnea with additional REM sleep accentuation.  A positional component was not strongly seen.  The patient did have minor hypoxemia with a nadir of 77% oxygen saturation had a total desaturation time under 89% of 9.5 minutes.  So the calculated AHI was 41.9 and we recommended to treat with CPAP.  I prescribed an auto CPAP machine with a settings of 5 through 15 cm water pressure heated humidification 2 cm expiratory pressure relief and a mask of her choice and comfort.  The patient have to wait due to the current supply chain problems quite a while for her new machine which is a lunar machine.  Her data download today shows that she has used the machine for 25 of the last 30 days which is an over 80% compliance with an average use at time of 7 hours and 10 minutes.  Her overall  4-hour usage has been at 73%.  The mean or average P90 5 is 9.2 cmH2O pressure, her average leak is very low 5.8 L/min and her residual AHI is only 3.5/h which which I am very happy with.  She had to quit CPAP from [place] through 12-31-2020 due to severe bronchitis and she still has coughing spells, congestion.  Febrile infection, right lung , she tested negative for Covid in a home test.          I had the pleasure to follow Kristina Webb until about 5 years ago and I had followed her for the use of CPAP in the treatment of obstructive sleep apnea.  She received a [employer] machine at the time, and machine that is now under recall.   She also quit using her CPAP and stated that since her husband is smoking in the home her CPAP machine dispensed basically smoky air to her.  Of course its not really something I can  control from from here, however she had a lot of psychosocial stressors since the pandemic and in June of this year she was let go from her job for 44 years.  Apparently she received some severance package but unemployment may still be needed until she reaches full retirement date. Her blood pressure exacerbated under the stress, today however she presents mother with low blood pressures here and borderline bradycardia.  One of the concerns is that the patient is now on treated for OSA and that her risk factors for cardiac and vascular disease in general are still high.  She had acute insomnia,  chest pressure and shortness of breath when she saw Howell Rucks , PA in her primary care physician's office. That was on 14 March 2020 shortly after she received the message about her drip being discontinued.   She was observed having apnea during a shoulder surgery on the right shoulder- 2019.  She has a history of diabetes type 2 non-insulin-dependent just on Metformin, history of asthma on Breo on Brio Ellipta, she has coughing spells sometimes interrupting her sleep at night.  I do think that these may very well be also related to the secondhand smoke exposure.     I have the pleasure of seeing Kristina Webb today, a right-handed Caucasian female with a possible sleep disorder.  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  Anemia, Anxiety, Arthritis, smoke induced Asthma, Depression, Deviated septum, GERD (gastroesophageal reflux disease), Tension-Headache, History of hiatal hernia, Hyperlipemia, Hypertension, Hypothyroidism, OSA (obstructive sleep apnea), and diabetes without insulin.   The patient had the first sleep study in the year 2011 but I can't find the original report.    Social history:  Patient is newly unemployed after 44 years on the same job- and her husband is disabled.  she lives in a household with a chain smoking husband , no  children. 2 cats.  Caffeine intake in form of Coffee( /) Soda( 2 cokes a day ) Tea ( /) or energy drinks. Regular exercise in form of walking 3 miles a day .   Hobbies : flower garden, croche,  colouring books.     Sleep habits are as follows: The patient's dinner" supper"  time is between 5.3- 6.30 PM. She has started to skip meals,  She feels no appetite , and this is depression related. She snacks. She lost the desire to cook. He smokes in all rooms of the  house, a source of tension.  The patient goes to bed at 7-8 PM and is still awake at 2-3 AM, having 'napped"- she feels tired and watches Tv for  many hours while in bed, having RLS, anxiety, getting hot flushes at 68 degrees Fahrenheit.  Has right shoulder pain which affected her sleep position.  The preferred sleep position is supine, with the support of 3 pillows. She reports postnasal drip, coughing, choking.  Dreams are reportedly rare. Marland Kitchen   7-8 AM is the usual rise time. The patient wakes up spontaneously.  She reports not feeling refreshed or restored in AM, with symptoms such as dry mouth-,  morning headaches-, and residual fatigue. Naps are taken infrequently, lasting from 0.5 -3 hours and are not refreshing .   Review of Systems: Out of a complete 14 system review, the patient complains of only the following symptoms, and all other reviewed systems are negative.: Fatigue, sleepiness , snoring, fragmented sleep, Insomnia  Deteriorated sleep hygiene, untreated OSA, coughing, sinus drainage.     Coughing interfered with her CPAP compliance.    How likely are you to doze in the following situations: 0 = not likely, 1 = slight chance, 2 = moderate chance, 3 = high chance   Sitting and Reading? Watching Television? Sitting inactive in a public place (theater or meeting)? As a passenger in a car for an hour without a break? Lying down in the afternoon when circumstances permit? Sitting and talking to someone? Sitting quietly  after lunch without alcohol? In a car, while stopped for a few minutes in traffic?   Total =  3/ 24 points   FSS endorsed at 45/ 63 points.   Social History   Socioeconomic History   Marital status: Married    Spouse name: Not on file   Number of children: 0   Years of education: Not on file   Highest education level: Not on file  Occupational History   Occupation: retired  Tobacco Use   Smoking status: Never   Smokeless tobacco: Never   Tobacco comments:    husband smokes in the house  Vaping Use   Vaping Use: Never used  Substance and Sexual Activity   Alcohol use: No   Drug use: No   Sexual activity: Yes    Partners: Male    Birth control/protection: None, Post-menopausal  Other Topics Concern   Not on file  Social History Narrative   Drinks about 1/2 can soda daily.   Social Determinants of Health   Financial Resource Strain: Not on file  Food Insecurity: Not on file  Transportation Needs: Not on file  Physical Activity: Not on file  Stress: Not on file  Social Connections: Not on file    Family History  Problem Relation Age of Onset   Emphysema Mother        smoked   Allergies Mother    Asthma Mother    Heart disease Mother    Breast cancer Mother        with mets to Bone   Prostate cancer Father    Brain cancer Father    Hypertension Brother    Colon cancer Paternal Grandmother    Allergic rhinitis Neg Hx    Angioedema Neg Hx    Eczema Neg Hx    Immunodeficiency Neg Hx    Urticaria Neg Hx     Past Medical History:  Diagnosis Date   Anemia    hx   Anxiety  Arthritis    back   Asthma    Depression    Deviated septum    GERD (gastroesophageal reflux disease)    Headache    History of hiatal hernia    Hyperlipemia    Hypertension    Hypothyroidism    OSA (obstructive sleep apnea)    does not use -last 6 months   Pre-diabetes    per pt    Past Surgical History:  Procedure Laterality Date   ANKLE GANGLION CYST EXCISION Left     BACK SURGERY     birth mark treatments     BREAST LUMPECTOMY WITH RADIOACTIVE SEED LOCALIZATION Left 02/02/2016   Procedure: LEFT BREAST LUMPECTOMY WITH RADIOACTIVE SEED LOCALIZATION;  Surgeon: Autumn Messing III, MD;  Location: [facility];  Service: General;  Laterality: Left;   BREAST SURGERY Right    papilloma removed,cyst lft   CATARACT EXTRACTION Right 12/2016   Dr. Harlow Mares   CHOLECYSTECTOMY N/A 02/20/2017   Procedure: LAPAROSCOPIC CHOLECYSTECTOMY WITH INTRAOPERATIVE CHOLANGIOGRAM;  Surgeon: Jovita Kussmaul, MD;  Location: [facility];  Service: General;  Laterality: N/A;   COLON SURGERY  2008   resection- endometriosis     Current Outpatient Medications on File Prior to Visit  Medication Sig Dispense Refill   acetaminophen (TYLENOL) 500 MG tablet Take 1,000 mg by mouth every 6 (six) hours as needed for mild pain, moderate pain or headache.     albuterol (PROAIR HFA) 108 (90 Base) MCG/ACT inhaler Inhale 2 puffs into the lungs every 6 (six) hours as needed for wheezing. 18 g 1   amitriptyline (ELAVIL) 10 MG tablet Take 1 tablet (10 mg total) by mouth at bedtime as needed.     amLODipine (NORVASC) 5 MG tablet Take 1 tablet (5 mg total) by mouth daily. 30 tablet 11   aspirin EC 81 MG tablet Take 1 tablet (81 mg total) by mouth daily. Swallow whole. 90 tablet 3   bisoprolol (ZEBETA) 10 MG tablet TAKE 1 TABLET DAILY FOR BLOOD PRESSURE 90 tablet 3   Blood Glucose Monitoring Suppl DEVI Use to test blood sugar once daily. Dx: E11.22, N18.2 1 Device 0   Budeson-Glycopyrrol-Formoterol (BREZTRI AEROSPHERE) 160-9-4.8 MCG/ACT AERO Inhale 2 puffs into the lungs in the morning and at bedtime. 5.9 g 2   FREESTYLE LITE test strip USE TO CHECK BLOOD SUGAR ONCE DAILY. DX: E11.22, N18.2 100 strip 2   furosemide (LASIX) 40 MG tablet Take  1 tablet  Daily  for BP & Fluid Retention / Ankle Swelling 90 tablet 3   gabapentin (NEURONTIN) 100 MG capsule Take  1 capsule  4 x /day  for Shingles Neuritis Pains 120 capsule 0   Lancets  MISC Use to check blood sugar once daily. Dx: E11.22, N18.2 100 each 2   levothyroxine (SYNTHROID) 75 MCG tablet TAKE 1 TABLET BY MOUTH EVERY DAY ON EMPTY STOMACH WITH WATER. NO ANTACID MEDS FOR 30 MINS 90 tablet 3   metFORMIN (GLUCOPHAGE-XR) 500 MG 24 hr tablet Take 1 tablet (500 mg total) by mouth 2 (two) times daily. 180 tablet 2   montelukast (SINGULAIR) 10 MG tablet Take  1 tablet  Daily  for Allergies 90 tablet 3   olmesartan (BENICAR) 40 MG tablet TAKE 1 TABLET DAILY FOR BP & DIABETIC KIDNEY PROTECTION 90 tablet 1   pantoprazole (PROTONIX) 40 MG tablet TAKE 1 TABLET DAILY FOR HEARTBURN & INDIGESTION 90 tablet 3   rosuvastatin (CRESTOR) 10 MG tablet Take  1 tablet  3 x /week  on Mon Wed Fri  for Cholesterol                             /            TAKE 1 TABLET BY MOUTH 39 tablet 3   Semaglutide,0.25 or 0.5MG /DOS, 2 MG/1.5ML SOPN Inject 0.5 mg into the skin once a week. 2 mL 6   sertraline (ZOLOFT) 100 MG tablet TAKE 1 TABLET DAILY FOR DEPRESSION/ANXIETY 90 tablet 3   Vitamin D, Ergocalciferol, (DRISDOL) 1.25 MG (50000 UNIT) CAPS capsule TAKE 1 CAPSULE BY MOUTH 2 DAYS A WEEK 24 capsule 2   No current facility-administered medications on file prior to visit.    Allergies  Allergen Reactions   Levaquin [Levofloxacin In D5w] Other (See Comments)    Ankles hurt really bad ? ACHILLES TENDON ?   Doxycycline Other (See Comments)    UNSPECIFIED REACTION     Physical exam:  Today's Vitals   08/02/21 1433  BP: 91/60  Pulse: 73  SpO2: 97%  Weight: 200 lb (90.7 kg)  Height: 5\' 3"  (1.6 m)   Body mass index is 35.43 kg/m.   Wt Readings from Last 3 Encounters:  08/02/21 200 lb (90.7 kg)  06/11/21 204 lb 12.8 oz (92.9 kg)  05/16/21 203 lb (92.1 kg)     Ht Readings from Last 3 Encounters:  08/02/21 5\' 3"  (1.6 m)  05/16/21 5\' 3"  (1.6 m)  01/25/21 5\' 3"  (1.6 m)      General: The patient is awake, alert and appears not in acute distress.  The patient is well groomed. Head: facial  port wine stain.  Neck is supple. Mallampati 3 plus  neck circumference:17 inches . Nasal airflow is patent.  Retrognathia is seen. Cardiovascular:  Regular rate and cardiac rhythm by pulse,  without distended neck veins.  Hypotension today !!! Respiratory: Lungs are clear to auscultation.  Skin: facial skin thickening.  Trunk: The patient's posture is erect.   Neurologic exam : The patient is awake and alert, oriented to place and time.   Memory subjective described as intact.  Attention span & concentration ability appears normal.  Speech is fluent,  without  dysarthria, dysphonia or aphasia.  Mood and affect are appropriate.   Cranial nerves: no loss of smell or taste reported  Pupils are equal and briskly reactive to light. Funduscopic exam deferred.  Extraocular movements without nystagmus. No Diplopia. Visual fields by finger perimetry are intact. Hearing was intact to soft voice and finger rubbing.    Facial sensation intact to fine touch by q-tip.  Facial motor strength is symmetric, but middle branch of the right face is covered by port-wine stain.  The l right upper lip is appearing swollen.  The  tongue and uvula move midline.  Neck ROM : rotation, tilt and flexion extension were normal for age and shoulder shrug was symmetrical.    Motor exam:  Symmetric bulk, tone and ROM.   Normal tone without cog wheeling, symmetric grip strength . Stiffness in both hands- AND swelling. Had a trigger finger right ring finger     Coordination: Penmanship unchanged.  The Finger-to-nose maneuver was intact.  Gait and station: Patient could rise unassisted from a seated position, walked without assistive device. She has a slight stoop, Stance is of wider base and the patient turned with 3.5 steps.  Toe and heel walk were deferred.  Deep tendon reflexes: in the  upper and lower extremities are symmetric and intact.  Babinski response was deferred .    After spending a total time of 20  minutes face to face and additional time for physical and neurologic examination, review of laboratory studies,  personal review of imaging studies, reports and results of other testing and review of referral information / records as far as provided in visit, I have established the following assessments:  1) OSA and coughing- her severe OSA was REM accentuated - not ideal for a INspire device. Lost CPAP due to non-compliance.  She lost already 23 pounds since started on ozempic.  ,Coughing was reason for low compliance,  but her coughing is related to second hand smoke- she needs to create the smoke free environment. 2) Psychogenic chest pain, insomnia and anxiety- afraid of the future. Grieving her job. Depressed, not leaving the bed for half the day.  This reactive depression has let to bad sleep habits.    My Plan is to proceed with:  1) unfortunately , she had  to return her CPAP , she did not understand the compliance requirements.   I would like to thank Unk Pinto, MD for allowing me to meet with, and to take care of, this pleasant patient.   In short, Kristina Webb is presenting with insomnia, hypersomnia, untreated OSA, Asthma. I encouraged her to work on  more weight loss and revisit with me once she reaches 175 pounds or less- than we can apply for Inspire device. She will need one repeat HST before that - looking at degree of REM sleep apnea after weight loss.  I plan to follow up either personally or through our NP within XYZ months, prn as stated above.  CC: I will share my notes with PCP .  Electronically signed by: Larey Seat, MD 08/02/2021 3:01 PM  [facility] Board certified by The [employer] of Sleep Medicine and Diplomate of the [employer] of Sleep Medicine. Board certified In Neurology through the [place], Fellow of the [employer] of Neurology. Medical Director of [facility].

## 2021-08-02 NOTE — Patient Instructions (Signed)
Unfortunately , the patient had  to return her CPAP,  she did not completely understand the compliance requirements.   I would like to thank Unk Pinto, MD for allowing me to meet with, and to take care of, this pleasant patient.   In short, Kristina Webb is presenting with insomnia, hypersomnia, untreated OSA, Asthma. I encouraged her to work on  more weight loss and revisit with me once she reaches 175 pounds or less- than we can apply for Inspire device. She will need one repeat HST before that - looking at degree of REM sleep apnea after weight loss.  I plan to follow up either personally or through our NP within XYZ months, prn as stated above.  CC: I will share my notes with PCP .

## 2021-08-21 ENCOUNTER — Encounter: Payer: Self-pay | Admitting: Adult Health

## 2021-08-21 ENCOUNTER — Other Ambulatory Visit: Payer: Self-pay | Admitting: Adult Health

## 2021-08-21 MED ORDER — OZEMPIC (1 MG/DOSE) 4 MG/3ML ~~LOC~~ SOPN: 1.0000 mg | PEN_INJECTOR | SUBCUTANEOUS | 3 refills | Status: DC

## 2021-09-08 ENCOUNTER — Other Ambulatory Visit: Payer: Self-pay | Admitting: Adult Health

## 2021-09-08 ENCOUNTER — Other Ambulatory Visit: Payer: Self-pay | Admitting: Internal Medicine

## 2021-09-08 DIAGNOSIS — I10 Essential (primary) hypertension: Secondary | ICD-10-CM

## 2021-09-11 ENCOUNTER — Ambulatory Visit: Payer: Commercial Managed Care - HMO | Admitting: Nurse Practitioner

## 2021-09-13 ENCOUNTER — Encounter: Payer: Self-pay | Admitting: Adult Health

## 2021-09-26 ENCOUNTER — Other Ambulatory Visit: Payer: Self-pay

## 2021-09-26 ENCOUNTER — Encounter: Payer: Self-pay | Admitting: Adult Health

## 2021-09-26 ENCOUNTER — Ambulatory Visit (INDEPENDENT_AMBULATORY_CARE_PROVIDER_SITE_OTHER): Payer: Managed Care, Other (non HMO) | Admitting: Adult Health

## 2021-09-26 VITALS — BP 122/66 | HR 65 | Temp 97.9°F | Wt 200.0 lb

## 2021-09-26 DIAGNOSIS — F324 Major depressive disorder, single episode, in partial remission: Secondary | ICD-10-CM

## 2021-09-26 DIAGNOSIS — E119 Type 2 diabetes mellitus without complications: Secondary | ICD-10-CM | POA: Diagnosis not present

## 2021-09-26 DIAGNOSIS — E785 Hyperlipidemia, unspecified: Secondary | ICD-10-CM | POA: Diagnosis not present

## 2021-09-26 DIAGNOSIS — E1169 Type 2 diabetes mellitus with other specified complication: Secondary | ICD-10-CM

## 2021-09-26 DIAGNOSIS — E1122 Type 2 diabetes mellitus with diabetic chronic kidney disease: Secondary | ICD-10-CM

## 2021-09-26 DIAGNOSIS — E039 Hypothyroidism, unspecified: Secondary | ICD-10-CM

## 2021-09-26 DIAGNOSIS — G4733 Obstructive sleep apnea (adult) (pediatric): Secondary | ICD-10-CM

## 2021-09-26 DIAGNOSIS — I10 Essential (primary) hypertension: Secondary | ICD-10-CM | POA: Diagnosis not present

## 2021-09-26 DIAGNOSIS — Z79899 Other long term (current) drug therapy: Secondary | ICD-10-CM

## 2021-09-26 DIAGNOSIS — N183 Chronic kidney disease, stage 3 unspecified: Secondary | ICD-10-CM

## 2021-09-26 DIAGNOSIS — Z86018 Personal history of other benign neoplasm: Secondary | ICD-10-CM | POA: Insufficient documentation

## 2021-09-26 MED ORDER — METFORMIN HCL ER 500 MG PO TB24: 500.0000 mg | ORAL_TABLET | Freq: Every day | ORAL | 3 refills | Status: DC

## 2021-09-26 MED ORDER — OZEMPIC (1 MG/DOSE) 4 MG/3ML ~~LOC~~ SOPN: 1.0000 mg | PEN_INJECTOR | SUBCUTANEOUS | 3 refills | Status: DC

## 2021-09-26 NOTE — Progress Notes (Signed)
3MOV  Assessment and Plan:    Essential hypertension Continue medication Monitor blood pressure at home; call if consistently over 130/80 Continue DASH diet.   Reminder to go to the ER if any CP, SOB, nausea, dizziness, severe HA, changes vision/speech, left arm numbness and tingling and jaw pain.  Mixed hyperlipidemia associated with DMII Continue medications for LDL goal <70 Discussed dietary and exercise modifications Low fat diet -     Lipid panel  Gastroesophageal reflux disease with esophagitis Well managed on current medications Discussed diet, avoiding triggers and other lifestyle changes -     Magnesium  Type 2 diabetes mellitus with stage 2 chronic kidney disease, without long-term current use of insulin (HCC) Restart metformin, 1 tab daily  Ozempic - doing well increase to 1 mg/week -     TSH -     Hemoglobin A1c  CKD stage 3 due to type 2 diabetes mellitus (HCC) Increase fluids, avoid NSAIDS, monitor sugars, will monitor -     COMPLETE METABOLIC PANEL WITH GFR  Depression, recurrent in partial remission ([place]) Some breakthrough since retirement; declined med change after discussion;   May benefit from part time job, hobbies, volunteering Drive to local park for daily walk/exercise Consider therapy Lifestyle discussed: diet/exerise, sleep hygiene, stress management, hydration  Hypothyroidism Continue levothyroxine  Reminder to take on an empty stomach 30-71mns before first meal of the day. No antacid medications for 4 hours.  - TSH  Obstructive sleep apnea syndrome  neuro follows, has plan for CPAP retrial with new insurance Weight loss encouraged  Obesity, morbid (HCC) BMI 35 Discussed dietary and exercise modifications On ozempic; 0.5 mg well tolerated, sent in new dose 1 mg/week  Discussed dietary modifications Follow up in 3 months -     Hemoglobin A1c  Asthma, moderate persistent Advised breztri DAILY, rinse mouth after use  Has in date rescue  inhaler  Vitamin D deficiency Continue supplementation  Insomnia, unspecified type Doing well with medication Has follow up with Neurology related to OSA, CPAP evaluation -     amitriptyline (ELAVIL) 10 MG tablet; Take 1 tablet (10 mg total) by mouth at bedtime as needed.  Medication management Continued  Discussed med's effects and SE's. Labs and tests as requested with regular follow-up as recommended. Over 30 minutes of face to face exam, counseling, chart review, and complex, high level critical decision making was performed this visit.   Future Appointments  Date Time Provider [place] 01/23/2022  2:00 PM MWard Givens NP [facility] None  01/28/2022  3:00 PM CLiane Comber NP [facility] None    HPI  66y.o. female  presents for 3 month follow up on htn, hld, T2DM with CKD III, hypothyroid, OSA, obesity and vit D def.   She is retired in 2022, has depression in partial remission on zoloft 100 mg, also takes amitriptyline 10 mg PRN. Doing more crocheting and house projects and feels improving.   She has cough variant asthma, on breztri (but admits takes intermittently, does work well when needed), singulaire, albuterol PRN. On protonix 40 mg daily.   She had sleep study 08/2020 by Dr. DBrett Fairyshowing severe OSA, was on CPAP and tolerated, but had to send back as was using <4 hours, but has plan to retry since on new insurance.   BMI is Body mass index is 35.43 kg/m., she has been working on diet, admits less walking since not at work, has park could go to.  She is on ozempic 0.5 mg/week  and tolerating, would like to go up to 1 mg/week  Wt Readings from Last 3 Encounters:  09/26/21 200 lb (90.7 kg)  08/02/21 200 lb (90.7 kg)  06/11/21 204 lb 12.8 oz (92.9 kg)   Her blood pressure has been controlled at home, today their BP is BP: 122/66 She does not workout but more active around her home, staying busy with projects. She denies chest pain, dizziness.   Was having  exertional dyspnea. Did see cardiology Dr. Harrell Gave, had CT coronary calcium of 6 on 05/2020, felt deconditioning. Improved since losing weight.   She is on cholesterol medication (rosuvastatin 10 mg three days a week) and denies myalgias. Her cholesterol is at goal. The cholesterol last visit was:   Lab Results  Component Value Date   CHOL 139 06/11/2021   HDL 50 06/11/2021   LDLCALC 64 06/11/2021   TRIG 173 (H) 06/11/2021   CHOLHDL 2.8 06/11/2021   She has been working on diet and exercise for DMII, she is on bASA, she is on ACE/ARB and denies hyperglycemia, hypoglycemia , increased appetite, nausea, polydipsia, polyuria, visual disturbances, vomiting and weight loss.  Metformin 500 mg tabs - stopped taking a few months back since glucose improved  Ozempic 0.5 mg weekly and tolerating very well, weight loss is stalled, would like to increase dose She does have glucometer, checks occasionally, 90-120 Last A1C in the office was:  Lab Results  Component Value Date   HGBA1C 5.8 (H) 06/11/2021   Baseline CKD III recently trending down, admits to poor water intake, does diet soda. Last GFR: Lab Results  Component Value Date   EGFR 52 (L) 06/11/2021   She is on thyroid medication. Her medication was not changed last visit.  She is taking synthroid 24mg daily except 1.5 tab on Sundays.  Lab Results  Component Value Date   TSH 2.02 06/11/2021  .  Patient is on Vitamin D supplement.   Lab Results  Component Value Date   VD25OH 94 01/25/2021        Current Medications:  Current Outpatient Medications on File Prior to Visit  Medication Sig Dispense Refill   acetaminophen (TYLENOL) 500 MG tablet Take 1,000 mg by mouth every 6 (six) hours as needed for mild pain, moderate pain or headache.     albuterol (PROAIR HFA) 108 (90 Base) MCG/ACT inhaler Inhale 2 puffs into the lungs every 6 (six) hours as needed for wheezing. 18 g 1   amitriptyline (ELAVIL) 10 MG tablet Take 1 tablet (10  mg total) by mouth at bedtime as needed.     amLODipine (NORVASC) 5 MG tablet Take 1 tablet (5 mg total) by mouth daily. 30 tablet 11   aspirin EC 81 MG tablet Take 1 tablet (81 mg total) by mouth daily. Swallow whole. 90 tablet 3   bisoprolol (ZEBETA) 10 MG tablet TAKE 1 TABLET BY MOUTH EVERY DAY FOR BLOOD PRESSURE 90 tablet 3   Blood Glucose Monitoring Suppl DEVI Use to test blood sugar once daily. Dx: E11.22, N18.2 1 Device 0   Budeson-Glycopyrrol-Formoterol (BREZTRI AEROSPHERE) 160-9-4.8 MCG/ACT AERO Inhale 2 puffs into the lungs in the morning and at bedtime. 5.9 g 2   FREESTYLE LITE test strip USE TO CHECK BLOOD SUGAR ONCE DAILY. DX: E11.22, N18.2 100 strip 2   furosemide (LASIX) 40 MG tablet Take  1 tablet  Daily  for BP & Fluid Retention / Ankle Swelling 90 tablet 3   gabapentin (NEURONTIN) 100 MG capsule Take  1  capsule  4 x /day  for Shingles Neuritis Pains 120 capsule 0   Lancets MISC Use to check blood sugar once daily. Dx: E11.22, N18.2 100 each 2   levothyroxine (SYNTHROID) 75 MCG tablet TAKE 1 TABLET BY MOUTH EVERY DAY ON EMPTY STOMACH WITH WATER. NO ANTACID MEDS FOR 30 MINS 90 tablet 3   metFORMIN (GLUCOPHAGE-XR) 500 MG 24 hr tablet Take 1 tablet (500 mg total) by mouth 2 (two) times daily. 180 tablet 2   montelukast (SINGULAIR) 10 MG tablet Take  1 tablet  Daily  for Allergies 90 tablet 3   olmesartan (BENICAR) 40 MG tablet TAKE 1 TABLET DAILY FOR BLOOD PRESSURE & DIABETIC KIDNEY PROTECTION 90 tablet 1   pantoprazole (PROTONIX) 40 MG tablet TAKE 1 TABLET DAILY FOR HEARTBURN & INDIGESTION 90 tablet 3   rosuvastatin (CRESTOR) 10 MG tablet Take  1 tablet  3 x /week  on Mon Wed Fri  for Cholesterol                             /            TAKE 1 TABLET BY MOUTH 39 tablet 3   sertraline (ZOLOFT) 100 MG tablet TAKE 1 TABLET DAILY FOR DEPRESSION/ANXIETY 90 tablet 3   Vitamin D, Ergocalciferol, (DRISDOL) 1.25 MG (50000 UNIT) CAPS capsule TAKE 1 CAPSULE BY MOUTH 2 DAYS A WEEK 24 capsule 2    No current facility-administered medications on file prior to visit.   Allergies:  Allergies  Allergen Reactions   Levaquin [Levofloxacin In D5w] Other (See Comments)    Ankles hurt really bad ? ACHILLES TENDON ?   Doxycycline Other (See Comments)    UNSPECIFIED REACTION    Medical History:  She has Cough; Hypertension; GERD (gastroesophageal reflux disease); Asthma; Major depression in partial remission ([place]); Hyperlipidemia associated with type 2 diabetes mellitus ([place]); Type 2 diabetes mellitus ([place]); CKD stage 3 due to type 2 diabetes mellitus ([place]); Sturge-Weber syndrome; Lower back pain; Obesity hypoventilation syndrome ([place]); Family history of heart disease; Insomnia due to other mental disorder; Severe obstructive sleep apnea-hypopnea syndrome; Exposure to second hand tobacco smoke; Cough variant asthma; Sturge-Weber disease; RBBB; Atypical nevi; Iron deficiency; and Hypothyroidism on their problem list.  Surgical History:  She has a past surgical history that includes Back surgery; Ankle ganglion cyst excision (Left); birth mark treatments; Breast lumpectomy with radioactive seed localization (Left, 02/02/2016); Breast surgery (Right); Colon surgery (2008); Cholecystectomy (N/A, 02/20/2017); and Cataract extraction (Right, 12/2016). Family History:  Herfamily history includes Allergies in her mother; Asthma in her mother; Brain cancer in her father; Breast cancer in her mother; Colon cancer in her paternal grandmother; Emphysema in her mother; Heart disease in her mother; Hypertension in her brother; Prostate cancer in her father. Social History:  She reports that she has never smoked. She has never used smokeless tobacco. She reports that she does not drink alcohol and does not use drugs.   Review of Systems: Review of Systems  Constitutional:  Negative for malaise/fatigue and weight loss.  HENT:  Negative for hearing loss and tinnitus.   Eyes:  Negative for blurred vision and  double vision.  Respiratory:  Negative for cough, sputum production, shortness of breath and wheezing.   Cardiovascular:  Negative for chest pain, palpitations, orthopnea, claudication, leg swelling and PND.  Gastrointestinal:  Positive for constipation (mild, managing with softeners). Negative for abdominal pain, blood in stool, diarrhea, heartburn, melena,  nausea and vomiting.  Genitourinary: Negative.   Musculoskeletal:  Negative for falls, joint pain and myalgias.  Skin:  Negative for rash.  Neurological:  Negative for dizziness, tingling, sensory change, weakness and headaches.  Endo/Heme/Allergies:  Negative for polydipsia.  Psychiatric/Behavioral:  Positive for depression (down since retirement, improving). Negative for memory loss, substance abuse and suicidal ideas. The patient is not nervous/anxious and does not have insomnia.   All other systems reviewed and are negative.  Physical Exam: Estimated body mass index is 35.43 kg/m as calculated from the following:   Height as of 08/02/21: _0  (1.6 m).   Weight as of this encounter: 200 lb (90.7 kg). BP 122/66    Pulse 65    Temp 97.9 F (36.6 C)    Wt 200 lb (90.7 kg)    SpO2 99%    BMI 35.43 kg/m  General Appearance: Well nourished, in no apparent distress.  Eyes: PERRLA, EOMs, conjunctiva no swelling or erythema Sinuses: No Frontal/maxillary tenderness  ENT/Mouth: Ext aud canals clear, normal light reflex with TMs without erythema, bulging. Good dentition. No erythema, swelling, or exudate on post pharynx. Tonsils not swollen or erythematous. Hearing normal.  Neck: Supple, thyroid normal. No bruits  Respiratory: Respiratory effort normal, BS equal bilaterally without rales, rhonchi, wheezing or stridor.  Cardio: RRR without murmurs, rubs or gallops. Brisk peripheral pulses without edema.  Abdomen: Soft obese, nontender, no guarding, rebound, hernias, masses, or organomegaly.  Lymphatics: Non tender without lymphadenopathy.   Musculoskeletal: Full ROM all peripheral extremities,5/5 strength, and normal gait.  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  Warm, dry without rashes, ecchymosis. Several benign appearing nevi. Well healed excision site to mid back.  Neuro: Cranial nerves intact, reflexes equal bilaterally. Normal muscle tone, no cerebellar symptoms. Sensation intact.  Psych: Awake and oriented X 3, normal affect, Insight and Judgment appropriate.   Izora Ribas 3:46 PM [facility]

## 2021-09-27 LAB — LIPID PANEL
Cholesterol: 122 mg/dL (ref <200)
HDL: 48 mg/dL — ABNORMAL LOW (ref >=50)
LDL Cholesterol (Calc): 53 mg/dL (calc)
Non-HDL Cholesterol (Calc): 74 mg/dL (calc) (ref <130)
Total CHOL/HDL Ratio: 2.5 (calc) (ref <5.0)
Triglycerides: 127 mg/dL (ref <150)

## 2021-09-27 LAB — CBC WITH DIFFERENTIAL/PLATELET
Absolute Monocytes: 608 cells/uL (ref 200–950)
Basophils Absolute: 67 cells/uL (ref 0–200)
Basophils Relative: 0.7 %
Eosinophils Absolute: 523 cells/uL — ABNORMAL HIGH (ref 15–500)
Eosinophils Relative: 5.5 %
HCT: 37.4 % (ref 35.0–45.0)
Hemoglobin: 12.1 g/dL (ref 11.7–15.5)
Lymphs Abs: 2736 cells/uL (ref 850–3900)
MCH: 27.2 pg (ref 27.0–33.0)
MCHC: 32.4 g/dL (ref 32.0–36.0)
MCV: 84 fL (ref 80.0–100.0)
MPV: 10.1 fL (ref 7.5–12.5)
Monocytes Relative: 6.4 %
Neutro Abs: 5567 cells/uL (ref 1500–7800)
Neutrophils Relative %: 58.6 %
Platelets: 323 10*3/uL (ref 140–400)
RBC: 4.45 10*6/uL (ref 3.80–5.10)
RDW: 13.8 % (ref 11.0–15.0)
Total Lymphocyte: 28.8 %
WBC: 9.5 10*3/uL (ref 3.8–10.8)

## 2021-09-27 LAB — COMPLETE METABOLIC PANEL WITH GFR
AG Ratio: 1.4 (calc) (ref 1.0–2.5)
ALT: 26 U/L (ref 6–29)
AST: 25 U/L (ref 10–35)
Albumin: 4.2 g/dL (ref 3.6–5.1)
Alkaline phosphatase (APISO): 67 U/L (ref 37–153)
BUN/Creatinine Ratio: 17 (calc) (ref 6–22)
BUN: 18 mg/dL (ref 7–25)
CO2: 26 mmol/L (ref 20–32)
Calcium: 9.4 mg/dL (ref 8.6–10.4)
Chloride: 103 mmol/L (ref 98–110)
Creat: 1.09 mg/dL — ABNORMAL HIGH (ref 0.50–1.05)
Globulin: 3 g/dL (calc) (ref 1.9–3.7)
Glucose, Bld: 89 mg/dL (ref 65–99)
Potassium: 4.9 mmol/L (ref 3.5–5.3)
Sodium: 139 mmol/L (ref 135–146)
Total Bilirubin: 0.3 mg/dL (ref 0.2–1.2)
Total Protein: 7.2 g/dL (ref 6.1–8.1)
eGFR: 57 mL/min/{1.73_m2} — ABNORMAL LOW (ref >=60)

## 2021-09-27 LAB — TSH: TSH: 2.16 mIU/L (ref 0.40–4.50)

## 2021-09-27 LAB — HEMOGLOBIN A1C
Hgb A1c MFr Bld: 6 % of total Hgb — ABNORMAL HIGH (ref <5.7)
Mean Plasma Glucose: 126 mg/dL
eAG (mmol/L): 7 mmol/L

## 2021-09-27 LAB — MAGNESIUM: Magnesium: 2.3 mg/dL (ref 1.5–2.5)

## 2021-10-08 ENCOUNTER — Encounter: Payer: Self-pay | Admitting: Adult Health

## 2021-10-12 ENCOUNTER — Other Ambulatory Visit: Payer: Self-pay | Admitting: Adult Health

## 2021-10-12 DIAGNOSIS — E119 Type 2 diabetes mellitus without complications: Secondary | ICD-10-CM

## 2021-10-16 ENCOUNTER — Other Ambulatory Visit: Payer: Self-pay

## 2021-10-16 ENCOUNTER — Encounter: Payer: Self-pay | Admitting: Adult Health

## 2021-10-16 ENCOUNTER — Ambulatory Visit (INDEPENDENT_AMBULATORY_CARE_PROVIDER_SITE_OTHER): Payer: Managed Care, Other (non HMO) | Admitting: Adult Health

## 2021-10-16 VITALS — BP 96/60 | HR 86 | Temp 97.5°F | Wt 200.0 lb

## 2021-10-16 DIAGNOSIS — J069 Acute upper respiratory infection, unspecified: Secondary | ICD-10-CM

## 2021-10-16 DIAGNOSIS — Z1152 Encounter for screening for COVID-19: Secondary | ICD-10-CM | POA: Diagnosis not present

## 2021-10-16 DIAGNOSIS — E119 Type 2 diabetes mellitus without complications: Secondary | ICD-10-CM | POA: Diagnosis not present

## 2021-10-16 DIAGNOSIS — J45909 Unspecified asthma, uncomplicated: Secondary | ICD-10-CM

## 2021-10-16 DIAGNOSIS — R6889 Other general symptoms and signs: Secondary | ICD-10-CM

## 2021-10-16 DIAGNOSIS — I10 Essential (primary) hypertension: Secondary | ICD-10-CM

## 2021-10-16 DIAGNOSIS — J4541 Moderate persistent asthma with (acute) exacerbation: Secondary | ICD-10-CM

## 2021-10-16 LAB — POCT INFLUENZA A/B
Influenza A, POC: NEGATIVE
Influenza B, POC: NEGATIVE

## 2021-10-16 LAB — POC COVID19 BINAXNOW: SARS Coronavirus 2 Ag: NEGATIVE

## 2021-10-16 MED ORDER — AMLODIPINE BESYLATE 5 MG PO TABS: ORAL_TABLET | ORAL | 11 refills | Status: DC

## 2021-10-16 MED ORDER — TRULICITY 0.75 MG/0.5ML ~~LOC~~ SOAJ: 0.7500 mg | SUBCUTANEOUS | 0 refills | Status: DC

## 2021-10-16 MED ORDER — ALBUTEROL SULFATE HFA 108 (90 BASE) MCG/ACT IN AERS: 2.0000 | INHALATION_SPRAY | Freq: Four times a day (QID) | RESPIRATORY_TRACT | 1 refills | Status: DC | PRN

## 2021-10-16 MED ORDER — BREZTRI AEROSPHERE 160-9-4.8 MCG/ACT IN AERO: 2.0000 | INHALATION_SPRAY | Freq: Two times a day (BID) | RESPIRATORY_TRACT | 2 refills | Status: AC

## 2021-10-16 NOTE — Patient Instructions (Addendum)
Hold amlodipine 2.5 mg tomorrow morning unless running above 130/80    HOW TO TREAT VIRAL COUGH AND COLD SYMPTOMS: -Symptoms usually last at least 1 week with the worst symptoms being around day 4. - colds usually start with a sore throat and end with a cough, and the cough can take 2 weeks to get better. -No antibiotics are needed for colds, flu, sore throats, cough, bronchitis UNLESS symptoms are longer than 7 days OR if you are getting better then get drastically worse. -There are a lot of combination medications (Dayquil, Nyquil, Vicks 44, tyelnol cold and sinus, ETC). Please look at the ingredients on the back so that you are treating the correct symptoms and not doubling up on medications/ingredients.  Medicines you can use  Nasal congestion Little Remedies saline spray (aerosol/mist)- can try this, it is in the kids section - pseudoephedrine (Sudafed)- behind the counter, do not use if you have high blood pressure, medicine that have -D in them. - phenylephrine (Sudafed PE) -Dextormethorphan + chlorpheniramine (Coridcidin HBP)- okay if you have high blood pressure -Oxymetazoline (Afrin) nasal spray- LIMIT to 3 days -Saline nasal spray -Neti pot (used distilled or bottled water)  Ear pain/congestion -pseudoephedrine (sudafed) - Nasonex/flonase nasal spray  Fever -Acetaminophen (Tyelnol) -Ibuprofen (Advil, motrin, aleve)  Sore Throat -Acetaminophen (Tyelnol) -Ibuprofen (Advil, motrin, aleve) -Drink a lot of water -Gargle with salt water - Rest your voice (don't talk) -Throat sprays -Cough drops  Body Aches -Acetaminophen (Tyelnol) -Ibuprofen (Advil, motrin, aleve)  Headache -Acetaminophen (Tyelnol) -Ibuprofen (Advil, motrin, aleve) - Exedrin, Exedrin Migraine  Allergy symptoms (cough, sneeze, runny nose, itchy eyes) -Claritin or loratadine cheapest but likely the weakest -Zyrtec or certizine at night because it can make you sleepy -The strongest is  allegra or fexafinadine Cheapest at [employer], [employer], [employer]  Cough -Dextromethorphan (Delsym)- medicine that has DM in it -Guafenesin (Mucinex/Robitussin) - cough drops - drink lots of water  Chest Congestion -Guafenesin (Mucinex/Robitussin)  Red Itchy Eyes - Naphcon-A  Upset Stomach - Bland diet (nothing spicy, greasy, fried, and high acid foods like tomatoes, oranges, berries) -OKAY- cereal, bread, soup, crackers, rice -Eat smaller more frequent meals -reduce caffeine, no alcohol -Loperamide (Imodium-AD) if diarrhea -Prevacid for heart burn  General health when sick -Hydration -wash your hands frequently -keep surfaces clean -change pillow cases and sheets often -Get fresh air but do not exercise strenuously -Vitamin D, double up on it - Vitamin C -Zinc

## 2021-10-16 NOTE — Progress Notes (Signed)
Assessment and Plan:  Viral URI with Cough Moderate persistent asthma with exacerbation Most likely viral; mild sx; Discussed the importance of avoiding unnecessary antibiotic therapy. Suggested symptomatic OTC remedies. Nasal saline spray for congestion. Nasal steroids, allergy pill Wants to avoid oral steroid Has not been taking inhalers, fear of hyperglycemia with inhaled steroid, also cost Discussed mechanism, difference between daily inhaler and rescue inhaler, reassured would not expect sig glucose response with inhaled steroid At minimum resume daily inhaler with onset of URI sx, cough or wheezing  Reordered to resume Clarify with insurance if preferred daily inhaler Follow up if sx not improving within a few days with resuming inhalers -     albuterol (PROAIR HFA) 108 (90 Base) MCG/ACT inhaler; Inhale 2 puffs into the lungs every 6 (six) hours as needed for wheezing. -     Budeson-Glycopyrrol-Formoterol (BREZTRI AEROSPHERE) 160-9-4.8 MCG/ACT AERO; Inhale 2 puffs into the lungs in the morning and at bedtime. Rinse mouth/gargle after each use.  Type 2 diabetes mellitus without complication, without long-term current use of insulin (HCC) -     Dulaglutide (TRULICITY) 9.37 JI/9.6VE SOPN; Inject 0.75 mg into the skin once a week.  Essential hypertension Low taking amlodipine 2.5 mg daily with other current meds; advise to hold and not take unless BP at home persistently above 130/80, d/c if possible -     amLODipine (NORVASC) 5 MG tablet; Take 1/2 tab if blood pressure is above 130/80.   Further disposition pending results of labs. Discussed med's effects and SE's.   Over 30 minutes of exam, counseling, chart review, and critical decision making was performed.   Future Appointments  Date Time Provider [place]  01/23/2022  2:00 PM Ward Givens, NP [facility] None  01/28/2022  3:00 PM Liane Comber, NP [facility] None     ------------------------------------------------------------------------------------------------------------------   HPI BP 96/60    Pulse 86    Temp (!) 97.5 F (36.4 C)    Wt 200 lb (90.7 kg)    SpO2 98%    BMI 35.43 kg/m  66 y.o.female with asthma presents for evaluation of URI sx. She tested negative for covid and flu prior to being seen.   She reports sx began 3-4 days ago, mild sinus congestion, started coughing from chest 2 days ago (dry, non-productive). She reports generalized body aches, low grade fever up to 99.60F, has had chills intermittently.   She reports has taken mucinex, benadryl, promethazine- DM with some benefit Tylenol/advil for aching and fever  She is prescribed breztri and albuterol; states she has not been taking "i'm trying to avoid using as much as possible." She expresses concern with sugars going up on steroids, worries about breztri -   She admits not checking BPs at home, taking all meds in AM, did reduce amlodipine from 5 mg to 2.5 mg after last visit, today their BP is BP: 96/60 She denies chest pain, shortness of breath, dizziness but does endorse some fatigue.    Past Medical History:  Diagnosis Date   Anemia    hx   Anxiety    Arthritis    back   Asthma    Depression    Deviated septum    GERD (gastroesophageal reflux disease)    Headache    History of hiatal hernia    Hyperlipemia    Hypertension    Hypothyroidism    OSA (obstructive sleep apnea)    does not use -last 6 months   Pre-diabetes    per pt  Allergies  Allergen Reactions   Levaquin [Levofloxacin In D5w] Other (See Comments)    Ankles hurt really bad ? ACHILLES TENDON ?   Doxycycline Other (See Comments)    UNSPECIFIED REACTION     Current Outpatient Medications on File Prior to Visit  Medication Sig   acetaminophen (TYLENOL) 500 MG tablet Take 1,000 mg by mouth every 6 (six) hours as needed for mild pain, moderate pain or headache.   albuterol (PROAIR HFA)  108 (90 Base) MCG/ACT inhaler Inhale 2 puffs into the lungs every 6 (six) hours as needed for wheezing.   amitriptyline (ELAVIL) 10 MG tablet Take 1 tablet (10 mg total) by mouth at bedtime as needed.   amLODipine (NORVASC) 5 MG tablet Take 1 tablet (5 mg total) by mouth daily.   aspirin EC 81 MG tablet Take 1 tablet (81 mg total) by mouth daily. Swallow whole.   bisoprolol (ZEBETA) 10 MG tablet TAKE 1 TABLET BY MOUTH EVERY DAY FOR BLOOD PRESSURE   Blood Glucose Monitoring Suppl DEVI Use to test blood sugar once daily. Dx: E11.22, N18.2   Budeson-Glycopyrrol-Formoterol (BREZTRI AEROSPHERE) 160-9-4.8 MCG/ACT AERO Inhale 2 puffs into the lungs in the morning and at bedtime.   FREESTYLE LITE test strip USE TO CHECK BLOOD SUGAR ONCE DAILY. DX: E11.22, N18.2   furosemide (LASIX) 40 MG tablet Take  1 tablet  Daily  for BP & Fluid Retention / Ankle Swelling   gabapentin (NEURONTIN) 100 MG capsule Take  1 capsule  4 x /day  for Shingles Neuritis Pains   Lancets MISC Use to check blood sugar once daily. Dx: E11.22, N18.2   levothyroxine (SYNTHROID) 75 MCG tablet TAKE 1 TABLET BY MOUTH EVERY DAY ON EMPTY STOMACH WITH WATER. NO ANTACID MEDS FOR 30 MINS   metFORMIN (GLUCOPHAGE-XR) 500 MG 24 hr tablet Take 1 tablet (500 mg total) by mouth daily with supper.   montelukast (SINGULAIR) 10 MG tablet Take  1 tablet  Daily  for Allergies   olmesartan (BENICAR) 40 MG tablet TAKE 1 TABLET DAILY FOR BLOOD PRESSURE & DIABETIC KIDNEY PROTECTION   pantoprazole (PROTONIX) 40 MG tablet TAKE 1 TABLET DAILY FOR HEARTBURN & INDIGESTION   rosuvastatin (CRESTOR) 10 MG tablet Take  1 tablet  3 x /week  on Mon Wed Fri  for Cholesterol                             /            TAKE 1 TABLET BY MOUTH   sertraline (ZOLOFT) 100 MG tablet TAKE 1 TABLET DAILY FOR DEPRESSION/ANXIETY   Vitamin D, Ergocalciferol, (DRISDOL) 1.25 MG (50000 UNIT) CAPS capsule TAKE 1 CAPSULE BY MOUTH 2 DAYS A WEEK   Semaglutide, 1 MG/DOSE, (OZEMPIC, 1 MG/DOSE,)  4 MG/3ML SOPN Inject 1 mg into the skin once a week. (Patient not taking: Reported on 10/16/2021)   No current facility-administered medications on file prior to visit.    ROS: all negative except above.   Physical Exam:  BP 96/60    Pulse 86    Temp (!) 97.5 F (36.4 C)    Wt 200 lb (90.7 kg)    SpO2 98%    BMI 35.43 kg/m   General Appearance: Well nourished, in no apparent distress. Eyes: PERRLA, EOMs, conjunctiva no swelling or erythema Sinuses: No Frontal/maxillary tenderness ENT/Mouth: Ext aud canals clear, TMs without erythema, bulging. No erythema, swelling, or exudate on post pharynx.  Tonsils not swollen or erythematous. Hearing normal.  Neck: Supple, thyroid normal.  Respiratory: Respiratory effort normal, BS equal bilaterally without rales, rhonchi, wheezing or stridor.  Cardio: RRR with no MRGs. Brisk peripheral pulses without edema.  Abdomen: Soft, + BS.  Non tender, no guarding, rebound, hernias, masses. Lymphatics: Non tender without lymphadenopathy.  Musculoskeletal: Full ROM, 5/5 strength, normal gait.  Skin: Warm, dry without rashes, lesions, ecchymosis.  Neuro: Cranial nerves intact. Normal muscle tone, no cerebellar symptoms. Sensation intact.  Psych: Awake and oriented X 3, normal affect, Insight and Judgment appropriate.     Izora Ribas, NP 10:53 AM [facility]

## 2021-10-17 ENCOUNTER — Other Ambulatory Visit: Payer: Self-pay | Admitting: Adult Health

## 2021-10-17 ENCOUNTER — Encounter: Payer: Self-pay | Admitting: Adult Health

## 2021-10-17 MED ORDER — PREDNISONE 20 MG PO TABS: ORAL_TABLET | ORAL | 0 refills | Status: DC

## 2021-10-18 ENCOUNTER — Encounter: Payer: Self-pay | Admitting: Internal Medicine

## 2021-10-19 ENCOUNTER — Other Ambulatory Visit: Payer: Self-pay | Admitting: Adult Health

## 2021-10-19 ENCOUNTER — Telehealth: Payer: Self-pay

## 2021-10-19 MED ORDER — BENZONATATE 200 MG PO CAPS: ORAL_CAPSULE | ORAL | 1 refills | Status: DC

## 2021-10-19 MED ORDER — AMOXICILLIN-POT CLAVULANATE 875-125 MG PO TABS: 1.0000 | ORAL_TABLET | Freq: Two times a day (BID) | ORAL | 0 refills | Status: AC

## 2021-10-19 MED ORDER — PROMETHAZINE-DM 6.25-15 MG/5ML PO SYRP: 5.0000 mL | ORAL_SOLUTION | Freq: Four times a day (QID) | ORAL | 1 refills | Status: DC | PRN

## 2021-10-19 NOTE — Telephone Encounter (Signed)
Prior Authorization for Ozempic denied. Alternative meds are: a. Byetta or Bydureon,  b. Trulicity

## 2021-11-11 ENCOUNTER — Encounter: Payer: Self-pay | Admitting: Adult Health

## 2021-11-13 ENCOUNTER — Other Ambulatory Visit: Payer: Self-pay | Admitting: Adult Health

## 2021-11-13 MED ORDER — AZITHROMYCIN 250 MG PO TABS: ORAL_TABLET | ORAL | 1 refills | Status: AC

## 2021-11-18 ENCOUNTER — Other Ambulatory Visit: Payer: Self-pay | Admitting: Adult Health

## 2021-11-20 ENCOUNTER — Telehealth: Payer: Self-pay | Admitting: Adult Health

## 2021-11-20 NOTE — Telephone Encounter (Signed)
Patient states that she was on Ozempic in 2022 but when her insurance changed to [place], they wouldn't cover it unless she tried Trulicity first. Since starting Trulicity she has had a really sore throat and felt like it was "closing up." Would like to know if she can go back to [place] and see if insurance will cover it now after failing Trulicity? -e welch ?

## 2021-11-21 ENCOUNTER — Other Ambulatory Visit: Payer: Self-pay | Admitting: Adult Health

## 2021-11-21 DIAGNOSIS — E1169 Type 2 diabetes mellitus with other specified complication: Secondary | ICD-10-CM

## 2021-11-21 MED ORDER — OZEMPIC (0.25 OR 0.5 MG/DOSE) 2 MG/1.5ML ~~LOC~~ SOPN: PEN_INJECTOR | SUBCUTANEOUS | 0 refills | Status: DC

## 2021-11-29 ENCOUNTER — Other Ambulatory Visit: Payer: Self-pay | Admitting: Adult Health

## 2021-11-29 DIAGNOSIS — E1169 Type 2 diabetes mellitus with other specified complication: Secondary | ICD-10-CM

## 2021-12-04 ENCOUNTER — Other Ambulatory Visit: Payer: Self-pay

## 2021-12-04 DIAGNOSIS — E1169 Type 2 diabetes mellitus with other specified complication: Secondary | ICD-10-CM

## 2021-12-04 MED ORDER — OZEMPIC (0.25 OR 0.5 MG/DOSE) 2 MG/1.5ML ~~LOC~~ SOPN: PEN_INJECTOR | SUBCUTANEOUS | 0 refills | Status: DC

## 2021-12-05 ENCOUNTER — Other Ambulatory Visit: Payer: Self-pay

## 2021-12-05 ENCOUNTER — Telehealth: Payer: Self-pay | Admitting: Adult Health

## 2021-12-05 DIAGNOSIS — E1169 Type 2 diabetes mellitus with other specified complication: Secondary | ICD-10-CM

## 2021-12-05 MED ORDER — OZEMPIC (0.25 OR 0.5 MG/DOSE) 2 MG/3ML ~~LOC~~ SOPN: PEN_INJECTOR | SUBCUTANEOUS | 3 refills | Status: DC

## 2021-12-05 NOTE — Telephone Encounter (Signed)
Needing a PA for 3 mL Ozempic because the prescription that [facility] sent  in is no longer being made by Ozem[facility].  ?

## 2021-12-10 ENCOUNTER — Other Ambulatory Visit: Payer: Self-pay | Admitting: Internal Medicine

## 2021-12-10 DIAGNOSIS — R059 Cough, unspecified: Secondary | ICD-10-CM

## 2021-12-26 ENCOUNTER — Other Ambulatory Visit: Payer: Self-pay | Admitting: Internal Medicine

## 2021-12-26 DIAGNOSIS — J4541 Moderate persistent asthma with (acute) exacerbation: Secondary | ICD-10-CM

## 2021-12-26 DIAGNOSIS — R059 Cough, unspecified: Secondary | ICD-10-CM

## 2021-12-29 ENCOUNTER — Other Ambulatory Visit: Payer: Self-pay | Admitting: Internal Medicine

## 2022-01-07 ENCOUNTER — Other Ambulatory Visit: Payer: Self-pay | Admitting: Adult Health

## 2022-01-09 ENCOUNTER — Other Ambulatory Visit: Payer: Self-pay | Admitting: Adult Health

## 2022-01-09 DIAGNOSIS — J4541 Moderate persistent asthma with (acute) exacerbation: Secondary | ICD-10-CM

## 2022-01-23 ENCOUNTER — Ambulatory Visit: Payer: 59 | Admitting: Adult Health

## 2022-01-28 ENCOUNTER — Encounter: Payer: 59 | Admitting: Adult Health

## 2022-02-12 LAB — HM DIABETES EYE EXAM

## 2022-02-28 ENCOUNTER — Ambulatory Visit (INDEPENDENT_AMBULATORY_CARE_PROVIDER_SITE_OTHER): Payer: Commercial Managed Care - HMO | Admitting: Adult Health

## 2022-02-28 ENCOUNTER — Encounter: Payer: Self-pay | Admitting: Adult Health

## 2022-02-28 VITALS — BP 106/64 | HR 51 | Temp 96.9°F | Ht 63.0 in | Wt 216.6 lb

## 2022-02-28 DIAGNOSIS — E1122 Type 2 diabetes mellitus with diabetic chronic kidney disease: Secondary | ICD-10-CM

## 2022-02-28 DIAGNOSIS — Z79899 Other long term (current) drug therapy: Secondary | ICD-10-CM

## 2022-02-28 DIAGNOSIS — Z136 Encounter for screening for cardiovascular disorders: Secondary | ICD-10-CM

## 2022-02-28 DIAGNOSIS — I10 Essential (primary) hypertension: Secondary | ICD-10-CM | POA: Diagnosis not present

## 2022-02-28 DIAGNOSIS — Z130 Encounter for screening for diseases of the blood and blood-forming organs and certain disorders involving the immune mechanism: Secondary | ICD-10-CM | POA: Diagnosis not present

## 2022-02-28 DIAGNOSIS — E1169 Type 2 diabetes mellitus with other specified complication: Secondary | ICD-10-CM

## 2022-02-28 DIAGNOSIS — E559 Vitamin D deficiency, unspecified: Secondary | ICD-10-CM

## 2022-02-28 DIAGNOSIS — E662 Morbid (severe) obesity with alveolar hypoventilation: Secondary | ICD-10-CM

## 2022-02-28 DIAGNOSIS — Z0001 Encounter for general adult medical examination with abnormal findings: Secondary | ICD-10-CM

## 2022-02-28 DIAGNOSIS — E611 Iron deficiency: Secondary | ICD-10-CM

## 2022-02-28 DIAGNOSIS — Z0000 Encounter for general adult medical examination without abnormal findings: Secondary | ICD-10-CM | POA: Diagnosis not present

## 2022-02-28 DIAGNOSIS — F324 Major depressive disorder, single episode, in partial remission: Secondary | ICD-10-CM

## 2022-02-28 DIAGNOSIS — Z13220 Encounter for screening for lipoid disorders: Secondary | ICD-10-CM

## 2022-02-28 DIAGNOSIS — Z1389 Encounter for screening for other disorder: Secondary | ICD-10-CM | POA: Diagnosis not present

## 2022-02-28 DIAGNOSIS — G4700 Insomnia, unspecified: Secondary | ICD-10-CM

## 2022-02-28 DIAGNOSIS — I4510 Unspecified right bundle-branch block: Secondary | ICD-10-CM | POA: Diagnosis not present

## 2022-02-28 DIAGNOSIS — Q8589 Other phakomatoses, not elsewhere classified: Secondary | ICD-10-CM

## 2022-02-28 DIAGNOSIS — G4733 Obstructive sleep apnea (adult) (pediatric): Secondary | ICD-10-CM

## 2022-02-28 DIAGNOSIS — Z131 Encounter for screening for diabetes mellitus: Secondary | ICD-10-CM

## 2022-02-28 DIAGNOSIS — F5105 Insomnia due to other mental disorder: Secondary | ICD-10-CM

## 2022-02-28 DIAGNOSIS — J45991 Cough variant asthma: Secondary | ICD-10-CM

## 2022-02-28 DIAGNOSIS — E039 Hypothyroidism, unspecified: Secondary | ICD-10-CM

## 2022-02-28 MED ORDER — METHOCARBAMOL 500 MG PO TABS: 500.0000 mg | ORAL_TABLET | Freq: Three times a day (TID) | ORAL | 0 refills | Status: AC | PRN

## 2022-02-28 MED ORDER — AMITRIPTYLINE HCL 10 MG PO TABS: 10.0000 mg | ORAL_TABLET | Freq: Every evening | ORAL | 3 refills | Status: DC | PRN

## 2022-02-28 NOTE — Patient Instructions (Signed)
Kristina Webb , Thank you for taking time to come for your Annual Wellness Visit. I appreciate your ongoing commitment to your health goals. Please review the following plan we discussed and let me know if I can assist you in the future.   These are the goals we discussed:  Goals      Blood Pressure < 130/80     Exercise 150 min/wk Moderate Activity     Weight (lb) < 200 lb (90.7 kg)        This is a list of the screening recommended for you and due dates:  Health Maintenance  Topic Date Due   Zoster (Shingles) Vaccine (1 of 2) Never done   Pap Smear  05/16/2018   Eye exam for diabetics  10/17/2021   Mammogram  01/24/2022   COVID-19 Vaccine (4 - Booster for Pfizer series) 03/16/2022*   Flu Shot  03/26/2022   Hemoglobin A1C  03/26/2022   Colon Cancer Screening  11/11/2022   Complete foot exam   03/01/2023   Tetanus Vaccine  10/23/2027   Hepatitis C Screening: USPSTF Recommendation to screen - Ages 18-79 yo.  Completed   HIV Screening  Completed   HPV Vaccine  Aged Out  *Topic was postponed. The date shown is not the original due date.      Know what a healthy weight is for you (roughly BMI <25) and aim to maintain this  Aim for 7+ servings of fruits and vegetables daily  65-80+ fluid ounces of water or unsweet tea for healthy kidneys  Limit to max 1 drink of alcohol per day; avoid smoking/tobacco  Limit animal fats in diet for cholesterol and heart health - choose grass fed whenever available  Avoid highly processed foods, and foods high in saturated/trans fats  Aim for low stress - take time to unwind and care for your mental health  Aim for 150 min of moderate intensity exercise weekly for heart health, and weights twice weekly for bone health  Aim for 7-9 hours of sleep daily     High-Fiber Eating Plan Fiber, also called dietary fiber, is a type of carbohydrate. It is found foods such as fruits, vegetables, whole grains, and beans. A high-fiber diet can have  many health benefits. Your health care provider may recommend a high-fiber diet to help: Prevent constipation. Fiber can make your bowel movements more regular. Lower your cholesterol. Relieve the following conditions: Inflammation of veins in the anus (hemorrhoids). Inflammation of specific areas of the digestive tract (uncomplicated diverticulosis). A problem of the large intestine, also called the colon, that sometimes causes pain and diarrhea (irritable bowel syndrome, or IBS). Prevent overeating as part of a weight-loss plan. Prevent heart disease, type 2 diabetes, and certain cancers. What are tips for following this plan? Reading food labels  Check the nutrition facts label on food products for the amount of dietary fiber. Choose foods that have 5 grams of fiber or more per serving. The goals for recommended daily fiber intake include: Men (age 67 or younger): 34-38 g. Men (over age 29): 28-34 g. Women (age 75 or younger): 25-28 g. Women (over age 58): 22-25 g. Your daily fiber goal is _____________ g. Shopping Choose whole fruits and vegetables instead of processed forms, such as apple juice or applesauce. Choose a wide variety of high-fiber foods such as avocados, lentils, oats, and kidney beans. Read the nutrition facts label of the foods you choose. Be aware of foods with added fiber. These foods often  have high sugar and sodium amounts per serving. Cooking Use whole-grain flour for baking and cooking. Cook with [place]aires rice instead of white rice. Meal planning Start the day with a breakfast that is high in fiber, such as a cereal that contains 5 g of fiber or more per serving. Eat breads and cereals that are made with whole-grain flour instead of refined flour or white flour. Eat [place]ins rice, bulgur wheat, or millet instead of white rice. Use beans in place of meat in soups, salads, and pasta dishes. Be sure that half of the grains you eat each day are whole grains. General  information You can get the recommended daily intake of dietary fiber by: Eating a variety of fruits, vegetables, grains, nuts, and beans. Taking a fiber supplement if you are not able to take in enough fiber in your diet. It is better to get fiber through food than from a supplement. Gradually increase how much fiber you consume. If you increase your intake of dietary fiber too quickly, you may have bloating, cramping, or gas. Drink plenty of water to help you digest fiber. Choose high-fiber snacks, such as berries, raw vegetables, nuts, and popcorn. What foods should I eat? Fruits Berries. Pears. Apples. Oranges. Avocado. Prunes and raisins. Dried figs. Vegetables Sweet potatoes. Spinach. Kale. Artichokes. Cabbage. Broccoli. Cauliflower. Green peas. Carrots. Squash. Grains Whole-grain breads. Multigrain cereal. Oats and oatmeal. [place] rice. Barley. Bulgur wheat. [place]. Quinoa. Bran muffins. Popcorn. Rye wafer crackers. Meats and other proteins Navy beans, kidney beans, and pinto beans. Soybeans. Split peas. Lentils. Nuts and seeds. Dairy Fiber-fortified yogurt. Beverages Fiber-fortified soy milk. Fiber-fortified orange juice. Other foods Fiber bars. The items listed above may not be a complete list of recommended foods and beverages. Contact a dietitian for more information. What foods should I avoid? Fruits Fruit juice. Cooked, strained fruit. Vegetables Fried potatoes. Canned vegetables. Well-cooked vegetables. Grains White bread. Pasta made with refined flour. White rice. Meats and other proteins Fatty cuts of meat. Fried chicken or fried fish. Dairy Milk. Yogurt. Cream cheese. Sour cream. Fats and oils Butters. Beverages Soft drinks. Other foods Cakes and pastries. The items listed above may not be a complete list of foods and beverages to avoid. Talk with your dietitian about what choices are best for you. Summary Fiber is a type of carbohydrate. It is found in foods  such as fruits, vegetables, whole grains, and beans. A high-fiber diet has many benefits. It can help to prevent constipation, lower blood cholesterol, aid weight loss, and reduce your risk of heart disease, diabetes, and certain cancers. Increase your intake of fiber gradually. Increasing fiber too quickly may cause cramping, bloating, and gas. Drink plenty of water while you increase the amount of fiber you consume. The best sources of fiber include whole fruits and vegetables, whole grains, nuts, seeds, and beans. This information is not intended to replace advice given to you by your health care provider. Make sure you discuss any questions you have with your health care provider. Document Revised: 12/16/2019 Document Reviewed: 12/16/2019 [employer] Patient Education  [place].

## 2022-02-28 NOTE — Progress Notes (Signed)
Complete Physical  Assessment and Plan:  Kristina Webb was seen today for annual exam.  Diagnoses and all orders for this visit:  Encounter for Annual Physical Exam with abnormal findings Due annually  Health Maintenance reviewed Healthy lifestyle reviewed and goals set - PAP/mammogram reports requested - Pending receipt of diabetes eye exam - review at next OV - Shingrix at pharmacy   Essential hypertension Aggressively controlled BPs, fatigue; try holding amlodipine 2.5 mg daily, ok to stay off if home BPs mostly 120s/70s Monitor blood pressure at home; call if consistently over 130/80 Continue DASH diet.   Reminder to go to the ER if any CP, SOB, nausea, dizziness, severe HA, changes vision/speech, left arm numbness and tingling and jaw pain. -     CBC with Differential/Platelet -     COMPLETE METABOLIC PANEL WITH GFR -     Magnesium -     EKG 12-Lead -     Microalbumin / creatinine urine ratio  Mixed hyperlipidemia associated with DMII Continue medications LDL goal <70  Discussed dietary and exercise modifications Low fat diet -     Lipid panel -     TSH  Gastroesophageal reflux disease with esophagitis Well managed on current medications Discussed diet, avoiding triggers and other lifestyle changes -     Magnesium  Type 2 diabetes mellitus with stage 2 chronic kidney disease, without long-term current use of insulin [facility]) Education: Reviewed 'ABCs' of diabetes management (respective goals in parentheses):  A1C (<7), blood pressure (<130/80), and cholesterol (LDL <70) Eye Exam yearly and Dental Exam every 6 months. Dietary recommendations Physical Activity recommendations Resume metformin 500 mg daily Given ozempic sample, will supply until gets on medicare and can apply for financial assistance  -     Hemoglobin A1c -     HM DIABETES FOOT EXAM  CKD stage 2 due to type 2 diabetes mellitus (HCC) Increase fluids, avoid NSAIDS, monitor sugars, will monitor -     COMPLETE  METABOLIC PANEL WITH GFR -     Microalbumin / creatinine urine ratio  Depression, recurrent in partial remission ([place]) Some breakthrough since retirement; declined med change after discussion;  May benefit from part time job, hobbies, volunteering Drive to local park for daily walk/exercise Consider therapy Lifestyle discussed: diet/exerise, sleep hygiene, stress management, hydration  Hypothyroidism Taking levothyroxine 75 mcg daily Reminder to take on an empty stomach 30-70mns before first meal of the day. No antacid medications for 4 hours.       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  Encounter for screening for cardiovascular disorders -     EKG 12-Lead  Medication management Continued  Orders Placed This Encounter  Procedures   CBC with Differential/Platelet   COMPLETE METABOLIC PANEL WITH GFR   Magnesium   Lipid panel   TSH   Hemoglobin A1c   VITAMIN D 25 Hydroxy (Vit-D Deficiency,  Fractures)   Microalbumin / creatinine urine ratio   Urinalysis, Routine w reflex microscopic   Iron, TIBC and Ferritin Panel   EKG 12-Lead   HM DIABETES FOOT EXAM     Discussed med's effects and SE's. Screening labs and tests as requested with regular follow-up as recommended. Over 40 minutes of face to face exam, counseling, chart review, and complex, high level critical decision making was performed this visit.   Future Appointments  Date Time Provider [place]  03/03/2023  3:00 PM Alycia Rossetti, NP [facility] None     HPI  66 y.o. female  presents for a complete physical and follow up for has Cough; Hypertension; GERD (gastroesophageal reflux disease); Asthma; Major depression in partial remission ([place]); Hyperlipidemia associated with type 2 diabetes mellitus ([place]); Type 2 diabetes mellitus ([place]); CKD stage 3 due to type 2 diabetes mellitus ([place]); Sturge-Weber syndrome ([place]); Lower back pain; Obesity hypoventilation syndrome ([place]); Family history of heart disease; Insomnia due to other mental disorder; Severe obstructive sleep apnea-hypopnea syndrome; Exposure to second hand tobacco smoke; Cough variant asthma; RBBB; Iron deficiency; Hypothyroidism; and History of dysplastic nevus on their problem list.   She is married, no children, retired from Therapist, art.  She follows with Dr. Willis Modena for PAP, follows with Dr. Isaiah Blakes for mammograms due to hx of papillary breast disease.   She has some depression following retirement, zoloft 100 mg does help, also takes amitriptyline 10 mg PRN.  Doing more crocheting.   She has cough variant asthma, on breztri (but admits takes intermittently PRN with allergies), singulaire, albuterol PRN.  On protonix 40 mg daily.   She had sleep study 08/2020 by Dr. Brett Fairy showing sever OSA, now on CPAP and reports doing well, reports 100% compliance.   BMI is Body mass index is 38.37 kg/m., she has been working on diet, admits less  walking since not at work, has park could go to.  She is newly on ozempic 0.25 mg/week and tolerating, insurance issue and has been off, regained 16 lb.  Wt Readings from Last 3 Encounters:  02/28/22 216 lb 9.6 oz (98.2 kg)  10/16/21 200 lb (90.7 kg)  09/26/21 200 lb (90.7 kg)   Her blood pressure has been controlled at home, today their BP is BP: 106/64,  BP Readings from Last 3 Encounters:  02/28/22 106/64  10/16/21 96/60  09/26/21 122/66   She does not workout. She denies chest pain, dizziness.  Endorses fatigue.  Currently taking bisoprolol 10 mg, olmesartan 40 mg, lasix 40 mg, amlodipine 2.5 mg daily.   Did see cardiology Dr. Harrell Gave, had CT coronary calcium of 6 on 05/2020.   She is on cholesterol medication (rosuvastatin 10 mg three days a week) and denies myalgias. Her cholesterol is at goal of LDL <70. The cholesterol last visit was:   Lab Results  Component Value Date   CHOL 122 09/26/2021   HDL 48 (L) 09/26/2021   LDLCALC 53 09/26/2021   TRIG 127 09/26/2021   CHOLHDL 2.5 09/26/2021   She has been  working on diet and exercise for DMII, she is on bASA, she is on ACE/ARB and denies hyperglycemia, hypoglycemia , increased appetite, nausea, polydipsia, polyuria, visual disturbances, vomiting and weight loss.  Metformin 500 mg tabs once daily - hasn't been taking, had some concerns, reviewed and alleviated, agrees to restart Did well on ozempic 0.25 mg/week. Cost barrier/high deductible, will get medicare in Oct Last A1C in the office was:  Lab Results  Component Value Date   HGBA1C 6.0 (H) 09/26/2021   Baseline CKD III (down from stage 2 in 2021), admits to poor water intake, does diet soda.  On olmesartan 40 mg daily  Last GFR: Lab Results  Component Value Date   EGFR 57 (L) 09/26/2021   EGFR 52 (L) 06/11/2021   Lab Results  Component Value Date   MICRALBCREAT 4 06/11/2021   She is on thyroid medication. Her medication was not changed last visit.  She is  taking synthroid 43mg daily. Lab Results  Component Value Date   TSH 2.16 09/26/2021  .  Patient is on Vitamin D supplement.   Lab Results  Component Value Date   VD25OH 94 01/25/2021     She reports has been taking iron supplement, ? 35 mg daily in the last month  Lab Results  Component Value Date   IRON 46 01/25/2021   TIBC 461 (H) 01/25/2021   FERRITIN 15 (L) 01/25/2021     Current Medications:  Current Outpatient Medications on File Prior to Visit  Medication Sig Dispense Refill   acetaminophen (TYLENOL) 500 MG tablet Take 1,000 mg by mouth every 6 (six) hours as needed for mild pain, moderate pain or headache.     albuterol (VENTOLIN HFA) 108 (90 Base) MCG/ACT inhaler INHALE 2 PUFFS INTO THE LUNGS EVERY 6 HOURS AS NEEDED FOR WHEEZE 8.5 each 1   amLODipine (NORVASC) 5 MG tablet Take 1/2 tab if blood pressure is above 130/80. 30 tablet 11   aspirin EC 81 MG tablet Take 1 tablet (81 mg total) by mouth daily. Swallow whole. 90 tablet 3   benzonatate (TESSALON) 200 MG capsule Take 1 cap 3 times a day as needed for cough. 60 capsule 1   bisoprolol (ZEBETA) 10 MG tablet TAKE 1 TABLET BY MOUTH EVERY DAY FOR BLOOD PRESSURE 90 tablet 3   Budeson-Glycopyrrol-Formoterol (BREZTRI AEROSPHERE) 160-9-4.8 MCG/ACT AERO Inhale 2 puffs into the lungs in the morning and at bedtime. Rinse mouth/gargle after each use. 5.9 g 2   FREESTYLE LITE test strip USE TO CHECK BLOOD SUGAR ONCE DAILY. DX: E11.22, N18.2 100 strip 2   furosemide (LASIX) 40 MG tablet Take  1 tablet  Daily  for BP & Fluid Retention / Ankle Swelling 90 tablet 3   Lancets MISC Use to check blood sugar once daily. Dx: E11.22, N18.2 100 each 2   levothyroxine (SYNTHROID) 75 MCG tablet Take  1 tablet  Daily  on an empty stomach with only water for 30 minutes & no Antacid meds, Calcium or Magnesium for 4 hours & avoid Biotin                                             /                          TAKE  BY                    MOUTH 90 tablet 3   metFORMIN (GLUCOPHAGE-XR) 500 MG 24 hr tablet Take 1 tablet (500 mg total) by mouth daily with supper. 90 tablet 3   montelukast (SINGULAIR) 10 MG tablet TAKE 1 TABLET BY MOUTH DAILY FOR ALLERGIES 90 tablet 3   olmesartan (BENICAR) 40 MG tablet TAKE 1 TABLET DAILY FOR BLOOD PRESSURE & DIABETIC KIDNEY PROTECTION 90 tablet 1   pantoprazole (PROTONIX) 40 MG tablet TAKE 1 TABLET DAILY FOR HEARTBURN & INDIGESTION 90 tablet 3   predniSONE (DELTASONE) 20 MG tablet 2 tablets daily for 3 days, 1 tablet daily for 4 days. 10 tablet 0   promethazine-dextromethorphan (PROMETHAZINE-DM) 6.25-15 MG/5ML syrup Take 1 teaspoonful every 4 hours as needed for Cough 360 mL 1   rosuvastatin (CRESTOR) 10 MG tablet Take  1 tablet  3 x /week  on Mon Wed Fri  for Cholesterol                             /            TAKE 1 TABLET BY MOUTH 39 tablet 3   sertraline (ZOLOFT) 100 MG tablet TAKE 1 TABLET DAILY FOR DEPRESSION/ANXIETY 90 tablet 3   Vitamin D, Ergocalciferol, (DRISDOL) 1.25 MG (50000 UNIT) CAPS capsule TAKE 1 CAPSULE BY MOUTH 2 DAYS A WEEK 24 capsule 2   Blood Glucose Monitoring Suppl DEVI Use to test blood sugar once daily. Dx: E11.22, N18.2 (Patient not taking: Reported on 02/28/2022) 1 Device 0   gabapentin (NEURONTIN) 100 MG capsule Take  1 capsule  4 x /day  for Shingles Neuritis Pains (Patient not taking: Reported on 02/28/2022) 120 capsule 0   Semaglutide,0.25 or 0.5MG/DOS, (OZEMPIC, 0.25 OR 0.5 MG/DOSE,) 2 MG/3ML SOPN Inject 0.73m into the skin once weekly. E11.69 (Patient not taking: Reported on 02/28/2022) 3 mL 3   No current facility-administered medications on file prior to visit.   Allergies:  Allergies  Allergen Reactions   Levaquin [Levofloxacin In D5w] Other (See Comments)    Ankles hurt really bad ? ACHILLES TENDON ?   Doxycycline Other (See Comments)    UNSPECIFIED REACTION    Medical History:  She has Cough; Hypertension; GERD (gastroesophageal reflux disease);  Asthma; Major depression in partial remission ([place]; Hyperlipidemia associated with type 2 diabetes mellitus ([place]; Type 2 diabetes mellitus ([place]; CKD stage 3 due to type 2 diabetes mellitus ([place]; Sturge-Weber syndrome ([place]; Lower back pain; Obesity hypoventilation syndrome ([place]; Family history of heart disease; Insomnia due to other mental disorder; Severe obstructive sleep apnea-hypopnea syndrome; Exposure to second hand tobacco smoke; Cough variant asthma; RBBB; Iron deficiency; Hypothyroidism; and History of dysplastic nevus on their problem list.  Health Maintenance:   Immunization History  Administered Date(s) Administered   Influenza Inj Mdck Quad With Preservative 06/14/2020   Influenza Split 04/26/2013, 05/29/2015   Influenza Whole 05/26/2012   Influenza,inj,Quad PF,6+ Mos 06/11/2021   Influenza-Unspecified 05/20/2017, 05/19/2018, 05/21/2019   PFIZER(Purple Top)SARS-COV-2 Vaccination 10/22/2019, 11/26/2019, 05/30/2020   Pneumococcal Polysaccharide-23 09/10/2016   Td 08/27/2007, 10/22/2017   Health Maintenance  Topic Date Due   Zoster Vaccines- Shingrix (1 of 2) Never done   PAP SMEAR-Modifier  05/16/2018   COVID-19 Vaccine (4 - Booster for [place]) 07/25/2020   OPHTHALMOLOGY EXAM  10/17/2021   MAMMOGRAM  01/24/2022   INFLUENZA VACCINE  03/26/2022  HEMOGLOBIN A1C  03/26/2022   COLONOSCOPY (Pts 45-71yr Insurance coverage will need to be confirmed)  11/11/2022   FOOT EXAM  03/01/2023   TETANUS/TDAP  10/23/2027   Hepatitis C Screening  Completed   HIV Screening  Completed   HPV VACCINES  Aged Out    Shingrix: declines for now, check with insurance after discussion   Pap: pelvic in 02/2021, Due Dr MPerlie Gold needs to schedule, report requested MGM: 07/2020, R diagnostic 11/2020 due to bloody discharge, had MRI, unclear, Dr. BRainey Pinesfollowing, mammogram reports requested DEXA: Due at age 66 Colonoscopy: 10/2017, 5 year recall   Last Dental Exam: last 2023, goes q635m Last Eye Exam: Dr. BoValetta Closelast 2 weeks ago 01/2022, no retinopathy per patient, requested report be forwarded Last derm exam: Dr. LuLedell Peoplesffice, last 2022  Patient Care Team: McUnk PintoMD as PCP - General (Internal Medicine) ChBuford DresserMD as PCP - Cardiology (Cardiology) DuNewt MinionMD as Consulting Physician (Orthopedic Surgery) Love, JaAlyson LocketMD as Consulting Physician (Neurology) HoRalene BatheMD as Consulting Physician (Ophthalmology) Gastroenterology, EaSadie Habers Consulting Physician (Gastroenterology)  Surgical History:  She has a past surgical history that includes Back surgery; Ankle ganglion cyst excision (Left); birth mark treatments; Breast lumpectomy with radioactive seed localization (Left, 02/02/2016); Breast surgery (Right); Colon surgery (2008); Cholecystectomy (N/A, 02/20/2017); and Cataract extraction (Right, 12/2016). Family History:  Herfamily history includes Allergies in her mother; Asthma in her mother; Brain cancer in her father; Breast cancer in her mother; Colon cancer in her paternal grandmother; Emphysema in her mother; Heart disease in her brother, mother, and sister; Hypertension in her brother and brother; Prostate cancer in her father; Rheumatic fever in her brother; Thyroid disease in her brother; Transient ischemic attack in her sister. Social History:  She reports that she has never smoked. She has never used smokeless tobacco. She reports that she does not drink alcohol and does not use drugs.   Review of Systems: Review of Systems  Constitutional:  Negative for malaise/fatigue and weight loss.  HENT:  Negative for hearing loss and tinnitus.   Eyes:  Negative for blurred vision and double vision.  Respiratory:  Negative for cough, sputum production, shortness of breath and wheezing.   Cardiovascular:  Negative for chest pain, palpitations, orthopnea, claudication, leg swelling and PND.  Gastrointestinal:  Negative for  abdominal pain, blood in stool, constipation, diarrhea, heartburn, melena, nausea and vomiting.  Genitourinary: Negative.   Musculoskeletal:  Negative for falls, joint pain and myalgias.  Skin:  Negative for rash.  Neurological:  Negative for dizziness, tingling, sensory change, weakness and headaches.  Endo/Heme/Allergies:  Negative for polydipsia.  Psychiatric/Behavioral:  Negative for depression (improved), memory loss, substance abuse and suicidal ideas. The patient is not nervous/anxious and does not have insomnia.   All other systems reviewed and are negative.   Physical Exam: Estimated body mass index is 38.37 kg/m as calculated from the following:   Height as of this encounter: _0  (1.6 m).   Weight as of this encounter: 216 lb 9.6 oz (98.2 kg). BP 106/64   Pulse (!) 51   Temp (!) 96.9 F (36.1 C)   Ht _1  (1.6 m)   Wt 216 lb 9.6 oz (98.2 kg)   SpO2 98%   BMI 38.37 kg/m  General Appearance: Well nourished, in no apparent distress.  Eyes: PERRLA, EOMs, conjunctiva no swelling or erythema, normal fundi and vessels.  Sinuses: No Frontal/maxillary tenderness  ENT/Mouth: Ext aud canals  clear, normal light reflex with TMs without erythema, bulging. Good dentition. No erythema, swelling, or exudate on post pharynx. Tonsils not swollen or erythematous. Hearing normal.  Neck: Supple, thyroid normal. No bruits  Respiratory: Respiratory effort normal, BS equal bilaterally without rales, rhonchi, wheezing or stridor.  Cardio: RRR without murmurs, rubs or gallops. Brisk peripheral pulses without edema.  Chest: symmetric, with normal excursions and percussion.  Abdomen: Soft obese, nontender, no guarding, rebound, hernias, masses, or organomegaly.  Lymphatics: Non tender without lymphadenopathy.  Musculoskeletal: Full ROM all peripheral extremities,5/5 strength, and normal gait.  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  Warm, dry without rashes, ecchymosis. She has several excoriated  areas to abdomen; several nevi, benign appearing.  Neuro: Cranial nerves intact, reflexes equal bilaterally. Normal muscle tone, no cerebellar symptoms. Sensation intact to monofilament. Psych: Awake and oriented X 3, normal affect, Insight and Judgment appropriate.   EKG: Sinus brady, IRBBB  Izora Ribas, NP-C 3:16 PM [facility]

## 2022-03-01 ENCOUNTER — Other Ambulatory Visit: Payer: Self-pay | Admitting: Adult Health

## 2022-03-01 DIAGNOSIS — E611 Iron deficiency: Secondary | ICD-10-CM

## 2022-03-01 LAB — CBC WITH DIFFERENTIAL/PLATELET
Absolute Monocytes: 481 cells/uL (ref 200–950)
Basophils Absolute: 42 cells/uL (ref 0–200)
Basophils Relative: 0.5 %
Eosinophils Absolute: 398 cells/uL (ref 15–500)
Eosinophils Relative: 4.8 %
HCT: 37.7 % (ref 35.0–45.0)
Hemoglobin: 12.1 g/dL (ref 11.7–15.5)
Lymphs Abs: 2706 cells/uL (ref 850–3900)
MCH: 27.1 pg (ref 27.0–33.0)
MCHC: 32.1 g/dL (ref 32.0–36.0)
MCV: 84.5 fL (ref 80.0–100.0)
MPV: 10.3 fL (ref 7.5–12.5)
Monocytes Relative: 5.8 %
Neutro Abs: 4673 cells/uL (ref 1500–7800)
Neutrophils Relative %: 56.3 %
Platelets: 269 10*3/uL (ref 140–400)
RBC: 4.46 10*6/uL (ref 3.80–5.10)
RDW: 15.3 % — ABNORMAL HIGH (ref 11.0–15.0)
Total Lymphocyte: 32.6 %
WBC: 8.3 10*3/uL (ref 3.8–10.8)

## 2022-03-01 LAB — COMPLETE METABOLIC PANEL WITH GFR
AG Ratio: 1.5 (calc) (ref 1.0–2.5)
ALT: 22 U/L (ref 6–29)
AST: 24 U/L (ref 10–35)
Albumin: 4.3 g/dL (ref 3.6–5.1)
Alkaline phosphatase (APISO): 67 U/L (ref 37–153)
BUN/Creatinine Ratio: 17 (calc) (ref 6–22)
BUN: 19 mg/dL (ref 7–25)
CO2: 30 mmol/L (ref 20–32)
Calcium: 9.7 mg/dL (ref 8.6–10.4)
Chloride: 102 mmol/L (ref 98–110)
Creat: 1.1 mg/dL — ABNORMAL HIGH (ref 0.50–1.05)
Globulin: 2.8 g/dL (calc) (ref 1.9–3.7)
Glucose, Bld: 92 mg/dL (ref 65–99)
Potassium: 4.6 mmol/L (ref 3.5–5.3)
Sodium: 141 mmol/L (ref 135–146)
Total Bilirubin: 0.4 mg/dL (ref 0.2–1.2)
Total Protein: 7.1 g/dL (ref 6.1–8.1)
eGFR: 56 mL/min/{1.73_m2} — ABNORMAL LOW (ref >=60)

## 2022-03-01 LAB — MICROALBUMIN / CREATININE URINE RATIO
Creatinine, Urine: 27 mg/dL (ref 20–275)
Microalb, Ur: <0.2 mg/dL

## 2022-03-01 LAB — URINALYSIS, ROUTINE W REFLEX MICROSCOPIC
Bilirubin Urine: NEGATIVE
Glucose, UA: NEGATIVE
Hgb urine dipstick: NEGATIVE
Ketones, ur: NEGATIVE
Leukocytes,Ua: NEGATIVE
Nitrite: NEGATIVE
Protein, ur: NEGATIVE
Specific Gravity, Urine: 1.008 (ref 1.001–1.035)
pH: 6 (ref 5.0–8.0)

## 2022-03-01 LAB — IRON,TIBC AND FERRITIN PANEL
%SAT: 41 % (calc) (ref 16–45)
Ferritin: 11 ng/mL — ABNORMAL LOW (ref 16–288)
Iron: 192 ug/dL — ABNORMAL HIGH (ref 45–160)
TIBC: 469 mcg/dL (calc) — ABNORMAL HIGH (ref 250–450)

## 2022-03-01 LAB — VITAMIN D 25 HYDROXY (VIT D DEFICIENCY, FRACTURES): Vit D, 25-Hydroxy: 91 ng/mL (ref 30–100)

## 2022-03-01 LAB — HEMOGLOBIN A1C
Hgb A1c MFr Bld: 6.3 % of total Hgb — ABNORMAL HIGH (ref <5.7)
Mean Plasma Glucose: 134 mg/dL
eAG (mmol/L): 7.4 mmol/L

## 2022-03-01 LAB — LIPID PANEL
Cholesterol: 145 mg/dL (ref <200)
HDL: 60 mg/dL (ref >=50)
LDL Cholesterol (Calc): 61 mg/dL (calc)
Non-HDL Cholesterol (Calc): 85 mg/dL (calc) (ref <130)
Total CHOL/HDL Ratio: 2.4 (calc) (ref <5.0)
Triglycerides: 159 mg/dL — ABNORMAL HIGH (ref <150)

## 2022-03-01 LAB — TSH: TSH: 3.87 mIU/L (ref 0.40–4.50)

## 2022-03-01 LAB — MAGNESIUM: Magnesium: 2.1 mg/dL (ref 1.5–2.5)

## 2022-03-04 ENCOUNTER — Other Ambulatory Visit: Payer: Self-pay | Admitting: Adult Health

## 2022-03-04 DIAGNOSIS — I10 Essential (primary) hypertension: Secondary | ICD-10-CM

## 2022-03-11 ENCOUNTER — Encounter: Payer: Self-pay | Admitting: Nurse Practitioner

## 2022-03-11 ENCOUNTER — Encounter: Payer: Self-pay | Admitting: Internal Medicine

## 2022-03-12 ENCOUNTER — Encounter: Payer: Self-pay | Admitting: Nurse Practitioner

## 2022-03-20 ENCOUNTER — Encounter: Payer: Self-pay | Admitting: Internal Medicine

## 2022-03-29 ENCOUNTER — Encounter: Payer: Self-pay | Admitting: Nurse Practitioner

## 2022-04-04 ENCOUNTER — Encounter: Payer: Self-pay | Admitting: Internal Medicine

## 2022-05-20 ENCOUNTER — Encounter: Payer: Self-pay | Admitting: Internal Medicine

## 2022-05-20 DIAGNOSIS — G4700 Insomnia, unspecified: Secondary | ICD-10-CM

## 2022-05-20 DIAGNOSIS — Z79899 Other long term (current) drug therapy: Secondary | ICD-10-CM

## 2022-05-20 MED ORDER — LEVOTHYROXINE SODIUM 75 MCG PO TABS: ORAL_TABLET | ORAL | 3 refills | Status: DC

## 2022-05-20 MED ORDER — ROSUVASTATIN CALCIUM 10 MG PO TABS: ORAL_TABLET | ORAL | 3 refills | Status: DC

## 2022-05-20 MED ORDER — AMITRIPTYLINE HCL 10 MG PO TABS: 10.0000 mg | ORAL_TABLET | Freq: Every evening | ORAL | 3 refills | Status: AC | PRN

## 2022-05-20 MED ORDER — SERTRALINE HCL 100 MG PO TABS: ORAL_TABLET | ORAL | 3 refills | Status: DC

## 2022-05-29 ENCOUNTER — Encounter: Payer: Self-pay | Admitting: Nurse Practitioner

## 2022-05-29 ENCOUNTER — Encounter: Payer: Self-pay | Admitting: Internal Medicine

## 2022-05-29 ENCOUNTER — Other Ambulatory Visit: Payer: Self-pay | Admitting: Nurse Practitioner

## 2022-05-29 DIAGNOSIS — E1169 Type 2 diabetes mellitus with other specified complication: Secondary | ICD-10-CM

## 2022-05-29 MED ORDER — OZEMPIC (0.25 OR 0.5 MG/DOSE) 2 MG/3ML ~~LOC~~ SOPN: PEN_INJECTOR | SUBCUTANEOUS | 3 refills | Status: DC

## 2022-05-29 NOTE — Telephone Encounter (Signed)
I am sending to you to get in her chart.  Thank you

## 2022-06-05 ENCOUNTER — Ambulatory Visit: Payer: PPO | Admitting: Nurse Practitioner

## 2022-06-25 ENCOUNTER — Other Ambulatory Visit: Payer: Self-pay | Admitting: Nurse Practitioner

## 2022-06-25 DIAGNOSIS — I10 Essential (primary) hypertension: Secondary | ICD-10-CM

## 2022-07-09 ENCOUNTER — Encounter: Payer: Self-pay | Admitting: Nurse Practitioner

## 2022-07-09 ENCOUNTER — Ambulatory Visit (INDEPENDENT_AMBULATORY_CARE_PROVIDER_SITE_OTHER): Payer: PPO | Admitting: Nurse Practitioner

## 2022-07-09 VITALS — BP 118/60 | HR 74 | Temp 97.6°F | Resp 17 | Ht 63.0 in | Wt 209.8 lb

## 2022-07-09 DIAGNOSIS — Z23 Encounter for immunization: Secondary | ICD-10-CM | POA: Diagnosis not present

## 2022-07-09 DIAGNOSIS — Z79899 Other long term (current) drug therapy: Secondary | ICD-10-CM

## 2022-07-09 DIAGNOSIS — R6889 Other general symptoms and signs: Secondary | ICD-10-CM

## 2022-07-09 DIAGNOSIS — E039 Hypothyroidism, unspecified: Secondary | ICD-10-CM

## 2022-07-09 DIAGNOSIS — Z13820 Encounter for screening for osteoporosis: Secondary | ICD-10-CM | POA: Diagnosis not present

## 2022-07-09 DIAGNOSIS — Z0001 Encounter for general adult medical examination with abnormal findings: Secondary | ICD-10-CM | POA: Diagnosis not present

## 2022-07-09 DIAGNOSIS — E559 Vitamin D deficiency, unspecified: Secondary | ICD-10-CM | POA: Diagnosis not present

## 2022-07-09 DIAGNOSIS — E1169 Type 2 diabetes mellitus with other specified complication: Secondary | ICD-10-CM | POA: Diagnosis not present

## 2022-07-09 DIAGNOSIS — F99 Mental disorder, not otherwise specified: Secondary | ICD-10-CM

## 2022-07-09 DIAGNOSIS — E785 Hyperlipidemia, unspecified: Secondary | ICD-10-CM

## 2022-07-09 DIAGNOSIS — I10 Essential (primary) hypertension: Secondary | ICD-10-CM | POA: Diagnosis not present

## 2022-07-09 DIAGNOSIS — Q8589 Other phakomatoses, not elsewhere classified: Secondary | ICD-10-CM

## 2022-07-09 DIAGNOSIS — J45909 Unspecified asthma, uncomplicated: Secondary | ICD-10-CM | POA: Diagnosis not present

## 2022-07-09 DIAGNOSIS — K2100 Gastro-esophageal reflux disease with esophagitis, without bleeding: Secondary | ICD-10-CM | POA: Diagnosis not present

## 2022-07-09 DIAGNOSIS — N183 Chronic kidney disease, stage 3 unspecified: Secondary | ICD-10-CM

## 2022-07-09 DIAGNOSIS — E1122 Type 2 diabetes mellitus with diabetic chronic kidney disease: Secondary | ICD-10-CM | POA: Diagnosis not present

## 2022-07-09 DIAGNOSIS — F324 Major depressive disorder, single episode, in partial remission: Secondary | ICD-10-CM | POA: Diagnosis not present

## 2022-07-09 DIAGNOSIS — Z0000 Encounter for general adult medical examination without abnormal findings: Secondary | ICD-10-CM

## 2022-07-09 DIAGNOSIS — F5105 Insomnia due to other mental disorder: Secondary | ICD-10-CM | POA: Diagnosis not present

## 2022-07-09 NOTE — Patient Instructions (Addendum)
Kristina Webb , Thank you for taking time to come for your Medicare Wellness Visit. I appreciate your ongoing commitment to your health goals. Please review the following plan we discussed and let me know if I can assist you in the future.   These are the goals we discussed:  Goals      Blood Pressure < 130/80     Exercise 150 min/wk Moderate Activity     Weight (lb) < 200 lb (90.7 kg)        This is a list of the screening recommended for you and due dates:  Health Maintenance  Topic Date Due   Medicare Annual Wellness Visit  Never done   Zoster (Shingles) Vaccine (1 of 2) Never done   COVID-19 Vaccine (4 - Pfizer risk series) 07/25/2020   Flu Shot  03/26/2022   DEXA scan (bone density measurement)  Never done   Pneumonia Vaccine (2 - PCV) 06/15/2022   Mammogram  07/27/2022   Hemoglobin A1C  08/31/2022   Colon Cancer Screening  11/11/2022   Eye exam for diabetics  02/13/2023   Yearly kidney function blood test for diabetes  03/01/2023   Yearly kidney health urinalysis for diabetes  03/01/2023   Complete foot exam   04/19/2023   Pap Smear  03/02/2024   Tetanus Vaccine  10/23/2027   Hepatitis C Screening: USPSTF Recommendation to screen - Ages 18-79 yo.  Completed   HIV Screening  Completed   HPV Vaccine  Aged Out   Healthy Eating Following a healthy eating pattern may help you to achieve and maintain a healthy body weight, reduce the risk of chronic disease, and live a long and productive life. It is important to follow a healthy eating pattern at an appropriate calorie level for your body. Your nutritional needs should be met primarily through food by choosing a variety of nutrient-rich foods. What are tips for following this plan? Reading food labels Read labels and choose the following: Reduced or low sodium. Juices with 100% fruit juice. Foods with low saturated fats and high polyunsaturated and monounsaturated fats. Foods with whole grains, such as whole wheat, cracked  wheat, [employer] rice, and wild rice. Whole grains that are fortified with folic acid. This is recommended for women who are pregnant or who want to become pregnant. Read labels and avoid the following: Foods with a lot of added sugars. These include foods that contain [place] sugar, corn sweetener, corn syrup, dextrose, fructose, glucose, high-fructose corn syrup, honey, invert sugar, lactose, malt syrup, maltose, molasses, raw sugar, sucrose, trehalose, or turbinado sugar. Do not eat more than the following amounts of added sugar per day: 6 teaspoons (25 g) for women. 9 teaspoons (38 g) for men. Foods that contain processed or refined starches and grains. Refined grain products, such as white flour, degermed cornmeal, white bread, and white rice. Shopping Choose nutrient-rich snacks, such as vegetables, whole fruits, and nuts. Avoid high-calorie and high-sugar snacks, such as potato chips, fruit snacks, and candy. Use oil-based dressings and spreads on foods instead of solid fats such as butter, stick margarine, or cream cheese. Limit pre-made sauces, mixes, and "instant" products such as flavored rice, instant noodles, and ready-made pasta. Try more plant-protein sources, such as tofu, tempeh, black beans, edamame, lentils, nuts, and seeds. Explore eating plans such as the Mediterranean diet or vegetarian diet. Cooking Use oil to saut or stir-fry foods instead of solid fats such as butter, stick margarine, or lard. Try baking, boiling, grilling, or broiling  instead of frying. Remove the fatty part of meats before cooking. Steam vegetables in water or broth. Meal planning  At meals, imagine dividing your plate into fourths: One-half of your plate is fruits and vegetables. One-fourth of your plate is whole grains. One-fourth of your plate is protein, especially lean meats, poultry, eggs, tofu, beans, or nuts. Include low-fat dairy as part of your daily diet. Lifestyle Choose healthy options  in all settings, including home, work, school, restaurants, or stores. Prepare your food safely: Wash your hands after handling raw meats. Keep food preparation surfaces clean by regularly washing with hot, soapy water. Keep raw meats separate from ready-to-eat foods, such as fruits and vegetables. Cook seafood, meat, poultry, and eggs to the recommended internal temperature. Store foods at safe temperatures. In general: Keep cold foods at 69F (4.4C) or below. Keep hot foods at 169F (60C) or above. Keep your freezer at [facility] (-17.8C) or below. Foods are no longer safe to eat when they have been between the temperatures of 40-169F (4.4-60C) for more than 2 hours. What foods should I eat? Fruits Aim to eat 2 cup-equivalents of fresh, canned (in natural juice), or frozen fruits each day. Examples of 1 cup-equivalent of fruit include 1 small apple, 8 large strawberries, 1 cup canned fruit,  cup dried fruit, or 1 cup 100% juice. Vegetables Aim to eat 2-3 cup-equivalents of fresh and frozen vegetables each day, including different varieties and colors. Examples of 1 cup-equivalent of vegetables include 2 medium carrots, 2 cups raw, leafy greens, 1 cup chopped vegetable (raw or cooked), or 1 medium baked potato. Grains Aim to eat 6 ounce-equivalents of whole grains each day. Examples of 1 ounce-equivalent of grains include 1 slice of bread, 1 cup ready-to-eat cereal, 3 cups popcorn, or  cup cooked rice, pasta, or cereal. Meats and other proteins Aim to eat 5-6 ounce-equivalents of protein each day. Examples of 1 ounce-equivalent of protein include 1 egg, 1/2 cup nuts or seeds, or 1 tablespoon (16 g) peanut butter. A cut of meat or fish that is the size of a deck of cards is about 3-4 ounce-equivalents. Of the protein you eat each week, try to have at least 8 ounces come from seafood. This includes salmon, trout, herring, and anchovies. Dairy Aim to eat 3 cup-equivalents of fat-free or low-fat  dairy each day. Examples of 1 cup-equivalent of dairy include 1 cup (240 mL) milk, 8 ounces (250 g) yogurt, 1 ounces (44 g) natural cheese, or 1 cup (240 mL) fortified soy milk. Fats and oils Aim for about 5 teaspoons (21 g) per day. Choose monounsaturated fats, such as canola and olive oils, avocados, peanut butter, and most nuts, or polyunsaturated fats, such as sunflower, corn, and soybean oils, walnuts, pine nuts, sesame seeds, sunflower seeds, and flaxseed. Beverages Aim for six 8-oz glasses of water per day. Limit coffee to three to five 8-oz cups per day. Limit caffeinated beverages that have added calories, such as soda and energy drinks. Limit alcohol intake to no more than 1 drink a day for nonpregnant women and 2 drinks a day for men. One drink equals 12 oz of beer (355 mL), 5 oz of wine (148 mL), or 1 oz of hard liquor (44 mL). Seasoning and other foods Avoid adding excess amounts of salt to your foods. Try flavoring foods with herbs and spices instead of salt. Avoid adding sugar to foods. Try using oil-based dressings, sauces, and spreads instead of solid fats. This information is based on  general [place] nutrition guidelines. For more information, visit [employer]. Exact amounts may vary based on your nutrition needs. Summary A healthy eating plan may help you to maintain a healthy weight, reduce the risk of chronic diseases, and stay active throughout your life. Plan your meals. Make sure you eat the right portions of a variety of nutrient-rich foods. Try baking, boiling, grilling, or broiling instead of frying. Choose healthy options in all settings, including home, work, school, restaurants, or stores. This information is not intended to replace advice given to you by your health care provider. Make sure you discuss any questions you have with your health care provider. Document Revised: 01/23/2022 Document Reviewed: 04/10/2021 [employer] Patient Education  [place].

## 2022-07-09 NOTE — Progress Notes (Signed)
WELCOME TO MEDICARE ANNUAL WELLNESS VISIT AND FOLLOW UP  Assessment:   Welcome to Medicare Due annually Health maintenance reviwed  Primary hypertension Discussed DASH (Dietary Approaches to Stop Hypertension) DASH diet is lower in sodium than a typical American diet. Cut back on foods that are high in saturated fat, cholesterol, and trans fats. Eat more whole-grain foods, fish, poultry, and nuts Remain active and exercise as tolerated daily.  Monitor BP at home-Call if greater than 130/80.  Check CMP/CBC    Mixed hyperlipidemia associated with DMII Discussed lifestyle modifications. Recommended diet heavy in fruits and veggies, omega 3's. Decrease consumption of animal meats, cheeses, and dairy products. Remain active and exercise as tolerated. Continue to monitor. Check lipids/TSH  Gastroesophageal reflux disease with esophagitis Continue Prevacid PRN. No suspected reflux complications ([place]/stricture). Lifestyle modification:  wt loss, avoid meals 2-3h before bedtime. Consider eliminating food triggers:  chocolate, caffeine, EtOH, acid/spicy food.   Type 2 diabetes mellitus with stage 2 chronic kidney disease, without long-term current use of insulin [facility]) Education: Reviewed 'ABCs' of diabetes management  Discussed goals to be met and/or maintained include A1C (<7) Blood pressure (<130/80) Cholesterol (LDL <70) Continue Eye Exam yearly  Continue Dental Exam Q6 mo Discussed dietary recommendations Discussed Physical Activity recommendations Foot exam UTD Check A1C    CKD stage 2 due to type 2 diabetes mellitus ([place]) Discussed how what you eat and drink can aide in kidney protection. Stay well hydrated. Avoid high salt foods. Avoid NSAIDS. Keep BP and BG well controlled.   Take medications as prescribed. Remain active and exercise as tolerated daily. Maintain weight.  Continue to monitor. Check CMP/GFR/Microablumin    Depression, recurrent in partial  remission ([place]) Some breakthrough since retirement; declined med change after discussion;  May benefit from part time job, hobbies, volunteering Drive to local park for daily walk/exercise Consider therapy Lifestyle discussed: diet/exerise, sleep hygiene, stress management, hydration   Hypothyroidism Controlled. Continue Levothyroxine. Reminded to take on an empty stomach 30-81mns before food.  Stop any Biotin Supplement 48-72 hours before next TSH level to reduce the risk of falsely low TSH levels. Continue to monitor.      Obstructive sleep apnea syndrome On CPAP with restorative sleep; neuro follows Weight loss encouraged Continue to monitor   Obesity, morbid (HCC) BMI 35 Discussed appropriate BMI Goal of losing 1 lb per month. Diet modification. Physical activity. Encouraged/praised to build confidence.    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 All questions and concerns regarding medications addressed.    Screening for osteoporosis Pursue a combination of weight-bearing exercises and strength training. Advised on fall prevention measures including proper lighting in all rooms, removal of area rugs and floor clutter, use of walking devices as deemed appropriate, avoidance of uneven walking surfaces. Smoking cessation and moderate alcohol consumption if applicable Consume 8536to 1000 IU of vitamin D daily with a goal vitamin D serum value of 30 ng/mL or higher. Aim for 1000 to 1200 mg of elemental calcium daily through supplements and/or dietary sources.  Orders Placed This Encounter  Procedures   DG Bone Density    Standing Status:   Future     Standing Expiration Date:   07/10/2023    Order Specific  Question:   Reason for Exam (SYMPTOM  OR DIAGNOSIS REQUIRED)    Answer:   Inititation of screening for osteoporosis    Order Specific Question:   Preferred imaging location?    Answer:   [facility]   Flu vaccine HIGH DOSE PF (Fluzone High dose)   CBC with Differential/Platelet   COMPLETE METABOLIC PANEL WITH GFR   Hemoglobin A1c   Lipid panel     Notify office for further evaluation and treatment, questions or concerns if any reported s/s fail to improve.   The patient was advised to call back or seek an in-person evaluation if any symptoms worsen or if the condition fails to improve as anticipated.   Further disposition pending results of labs. Discussed med's effects and SE's.    I discussed the assessment and treatment plan with the patient. The patient was provided an opportunity to ask questions and all were answered. The patient agreed with the plan and demonstrated an understanding of the instructions.  Discussed med's effects and SE's. Screening labs and tests as requested with regular follow-up as recommended.  I provided 30 minutes of face-to-face time during this encounter including counseling, chart review, and critical decision making was preformed.  Future Appointments  Date Time Provider [place]  09/09/2022  3:30 PM Darrol Jump, NP [facility] None  03/03/2023  3:00 PM Alycia Rossetti, NP [facility] None     Plan:   During the course of the visit the patient was educated and counseled about appropriate screening and preventive services including:   Pneumococcal vaccine  Prevnar 13 Influenza vaccine Td vaccine Screening electrocardiogram Bone densitometry screening Colorectal cancer screening Diabetes screening Glaucoma screening Nutrition counseling  Advanced directives: requested   Subjective:  Kristina Webb is a 66 y.o. female who presents for Medicare Annual Wellness Visit and  3 month follow up. 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   She is married, no children, retired from Therapist, art.   Overall she reports feeling well today.  She has some depression following retirement and mother's passing, zoloft 100 mg does help, also takes amitriptyline 10 mg PRN.   She has cough variant asthma, on breztri (but admits takes intermittently PRN with allergies), singulaire, albuterol PRN.  On protonix 40 mg daily.   She follows with Dr. Willis Modena for PAP, follows with Dr. Isaiah Blakes for mammograms due to hx of papillary breast disease.   She had sleep study 08/2020 by Dr. Brett Fairy showing sever OSA, now on CPAP and reports doing well, reports 100% compliance.    BMI is Body mass index is 37.16 kg/m., she has not been working on diet and exercise. Wt Readings from Last 3 Encounters:  07/09/22 209 lb 12.8 oz (95.2 kg)  02/28/22 216 lb 9.6 oz (98.2 kg)  10/16/21 200 lb (90.7 kg)    Her blood pressure has been controlled at home, today their BP is BP: 118/60  Did see cardiology Dr. Harrell Gave, had CT coronary calcium of 6 on 05/2020.     She does not  workout. She denies chest pain, shortness of breath, dizziness.   She is not on cholesterol medication and denies myalgias. Her cholesterol is at goal. The cholesterol last visit was:   Lab Results  Component Value Date   CHOL 145  02/28/2022   HDL 60 02/28/2022   LDLCALC 61 02/28/2022   TRIG 159 (H) 02/28/2022   CHOLHDL 2.4 02/28/2022   She has not been working on diet and exercise for DM, prediabetes, and denies polydipsia and polyuria. Last A1C in the office was:  Lab Results  Component Value Date   HGBA1C 6.3 (H) 02/28/2022   Last GFR: Lab Results  Component Value Date   EGFR 56 (L) 02/28/2022    She is on thyroid medication. Her medication was not changed last visit.  She is taking synthroid 22mg daily.   Lab Results  Component Value Date   TSH 3.87 02/28/2022      Patient is on Vitamin D supplement.   Lab Results  Component Value Date   VD25OH 91 02/28/2022      Medication Review: Current Outpatient Medications on File Prior to Visit  Medication Sig Dispense Refill   acetaminophen (TYLENOL) 500 MG tablet Take 1,000 mg by mouth every 6 (six) hours as needed for mild pain, moderate pain or headache.     albuterol (VENTOLIN HFA) 108 (90 Base) MCG/ACT inhaler INHALE 2 PUFFS INTO THE LUNGS EVERY 6 HOURS AS NEEDED FOR WHEEZE 8.5 each 1   amitriptyline (ELAVIL) 10 MG tablet Take 1 tablet (10 mg total) by mouth at bedtime as needed. 90 tablet 3   amLODipine (NORVASC) 5 MG tablet TAKE 1 TABLET (5 MG TOTAL) BY MOUTH DAILY. 90 tablet 3   aspirin EC 81 MG tablet Take 1 tablet (81 mg total) by mouth daily. Swallow whole. 90 tablet 3   benzonatate (TESSALON) 200 MG capsule Take 1 cap 3 times a day as needed for cough. 60 capsule 1   bisoprolol (ZEBETA) 10 MG tablet TAKE 1 TABLET BY MOUTH EVERY DAY FOR BLOOD PRESSURE 90 tablet 3   Budeson-Glycopyrrol-Formoterol (BREZTRI AEROSPHERE) 160-9-4.8 MCG/ACT AERO Inhale 2 puffs into the lungs in the morning and at bedtime. Rinse mouth/gargle after each use. 5.9 g 2   FREESTYLE LITE test strip USE TO CHECK BLOOD SUGAR ONCE DAILY. DX: E11.22, N18.2 100 strip 2   furosemide (LASIX) 40 MG tablet Take  1 tablet  Daily  for BP & Fluid Retention / Ankle Swelling 90 tablet 3    Lancets MISC Use to check blood sugar once daily. Dx: E11.22, N18.2 100 each 2   levothyroxine (SYNTHROID) 75 MCG tablet Take 1 tablet Daily on an empty stomach with only water for 30 minutes & no Antacid meds, Calcium or Magnesium for 4 hours & avoid Biotin 90 tablet 3   metFORMIN (GLUCOPHAGE-XR) 500 MG 24 hr tablet Take 1 tablet (500 mg total) by mouth daily with supper. 90 tablet 3   methocarbamol (ROBAXIN) 500 MG tablet Take 1 tablet (500 mg total) by mouth every 8 (eight) hours as needed for muscle spasms. 60 tablet 0   montelukast (SINGULAIR) 10 MG tablet TAKE 1 TABLET BY MOUTH DAILY FOR ALLERGIES 90 tablet 3   olmesartan (BENICAR) 40 MG tablet TAKE 1 TABLET DAILY FOR BLOOD PRESSURE & DIABETIC KIDNEY PROTECTION 30 tablet 5   pantoprazole (PROTONIX) 40 MG tablet TAKE 1 TABLET DAILY FOR HEARTBURN & INDIGESTION 90 tablet 3   rosuvastatin (CRESTOR) 10 MG tablet Take 1 tablet 3 x /  week on Mon Wed Fri for Cholesterol 39 tablet 3   Semaglutide,0.25 or 0.5MG/DOS, (OZEMPIC, 0.25 OR 0.5 MG/DOSE,) 2 MG/3ML SOPN Inject 0.56m into the skin once weekly. E11.69 3 mL 3   sertraline (ZOLOFT) 100 MG tablet TAKE 1 TABLET DAILY FOR DEPRESSION/ANXIETY 90 tablet 3   Vitamin D, Ergocalciferol, (DRISDOL) 1.25 MG (50000 UNIT) CAPS capsule TAKE 1 CAPSULE BY MOUTH 2 DAYS A WEEK 24 capsule 2   No current facility-administered medications on file prior to visit.    Allergies  Allergen Reactions   Levaquin [Levofloxacin In D5w] Other (See Comments)    Ankles hurt really bad ? ACHILLES TENDON ?   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[place] 03/29/2015   CKD stage 3 due to type 2 diabetes mellitus ([place] 04/15/2014   Hyperlipidemia associated with type 2 diabetes mellitus ([place]    Type 2 diabetes mellitus ([place]    Asthma    Major depression in partial remission ([place]    Hypertension    GERD (gastroesophageal reflux disease)    Cough 12/28/2012    Screening Tests Immunization History  Administered Date(s) Administered   Influenza Inj Mdck Quad With Preservative 06/14/2020   Influenza Split 04/26/2013, 05/29/2015   Influenza Whole 05/26/2012   Influenza, High Dose Seasonal PF 07/09/2022   Influenza,inj,Quad PF,6+ Mos 06/11/2021   Influenza-Unspecified 05/20/2017, 05/19/2018, 05/21/2019   PFIZER(Purple Top)SARS-COV-2 Vaccination 10/22/2019, 11/26/2019, 05/30/2020   Pneumococcal Polysaccharide-23 09/10/2016   Td 08/27/2007, 10/22/2017   Health Maintenance  Topic Date Due   Medicare Annual Wellness (AWV)  Never done   Zoster Vaccines- Shingrix (1 of 2) Never done   COVID-19 Vaccine (4 - Pfizer risk series) 07/25/2020   DEXA SCAN  Never done   Pneumonia Vaccine 65 Years old (2 - PCV) 06/15/2022   MAMMOGRAM  07/27/2022   HEMOGLOBIN A1C  08/31/2022   COLONOSCOPY (Pts 45-431yrInsurance coverage will need to be confirmed)  11/11/2022   OPHTHALMOLOGY EXAM  02/13/2023   Diabetic kidney evaluation - GFR measurement  03/01/2023   Diabetic kidney evaluation - Urine ACR  03/01/2023   FOOT EXAM  04/19/2023   PAP SMEAR-Modifier  03/02/2024   TETANUS/TDAP  10/23/2027   INFLUENZA VACCINE  Completed   Hepatitis C Screening  Completed   HIV Screening  Completed   HPV VACCINES  Aged Out    Last colonoscopy: 11/10/2017  Mammograms: Mammogram is Negative for 07/26/2021 Due again 07/2022  Last pap smear/pelvic exam: Completed HPV results.  02/28/2021 Reveals negative for intropathyales lesion or malignancy. HPV is also negative.  Re-screen 5 years.     DEXA: Due   Names of Other Physician/Practitioners you currently use: 1.  [facility] here for primary care 2. Dr. BoValetta Closeeye doctor, last visit 2023 3. Dr. BeGloriann Loandentist, last visit Q6m58m Dermatology - 2022   Patient Care Team: McKUnk PintoD as PCP - General (Internal Medicine) ChrBuford DresserD  as PCP - Cardiology (Cardiology) Newt Minion, MD as Consulting Physician (Orthopedic Surgery) Love, Alyson Locket, MD as Consulting Physician (Neurology) Ralene Bathe, MD as Consulting Physician (Ophthalmology) Gastroenterology, Sadie Haber as Consulting Physician (Gastroenterology)  SURGICAL HISTORY She  has a past surgical history that includes Back surgery; Ankle ganglion cyst excision (Left); birth mark treatments; Breast lumpectomy with radioactive seed localization (Left, 02/02/2016); Breast surgery (Right); Colon surgery (2008); Cholecystectomy (N/A, 02/20/2017); and Cataract extraction (Right, 12/2016). FAMILY HISTORY Her family history includes Allergies in her mother; Asthma in her mother; Brain cancer in her father; Breast cancer in her mother; Colon cancer in her paternal grandmother; Emphysema in her mother; Heart disease in her brother, mother, and sister; Hypertension in her brother and brother; Prostate cancer in her father; Rheumatic fever in her brother; Thyroid disease in her brother; Transient ischemic attack in her sister. SOCIAL HISTORY She  reports that she has never smoked. She has never used smokeless tobacco. She reports that she does not drink alcohol and does not use drugs.   MEDICARE WELLNESS OBJECTIVES: Physical activity:   Cardiac risk factors:   Depression/mood screen:      07/09/2022    2:33 PM  Depression screen PHQ 2/9  Decreased Interest 0  Down, Depressed, Hopeless 0  PHQ - 2 Score 0     ADLs:     07/09/2022    2:31 PM  In your present state of health, do you have any difficulty performing the following activities:  Hearing? 0  Vision? 0  Difficulty concentrating or making decisions? 0  Walking or climbing stairs? 0  Dressing or bathing? 0  Doing errands, shopping? 0  Preparing Food and eating ? N  Using the Toilet? N  In the past six months, have you accidently leaked urine? N  Do you have problems with loss of bowel control? N  Managing your Medications? N  Managing your Finances? N  Housekeeping or managing your Housekeeping? N     Cognitive Testing  Alert? Yes  Normal Appearance?Yes  Oriented to person? Yes  Place? Yes   Time? Yes  Recall of three objects?  Yes  Can perform simple calculations? Yes  Displays appropriate judgment?Yes  Can read the correct time from a watch face?Yes  EOL planning:    Review of Systems  Constitutional: Negative.   HENT: Negative.    Eyes: Negative.   Respiratory: Negative.    Cardiovascular: Negative.   Gastrointestinal: Negative.   Genitourinary: Negative.   Musculoskeletal: Negative.   Skin:  Positive for rash.  Neurological: Negative.   Psychiatric/Behavioral:  Positive for depression. The patient is nervous/anxious.      Objective:     Today's Vitals   07/09/22 1413  BP: 118/60  Pulse: 74  Resp: 17  Temp: 97.6 F (36.4 C)  SpO2: 97%  Weight: 209 lb 12.8 oz (95.2 kg)  Height: _0  (1.6 m)   Body mass index is 37.16 kg/m.  General appearance: alert, no distress, WD/WN, female HEENT: normocephalic, sclerae anicteric, TMs pearly, nares patent, no discharge or erythema, pharynx normal Oral cavity: MMM, no lesions Neck: supple, no lymphadenopathy, no thyromegaly, no masses Heart: RRR, normal S1, S2, no murmurs Lungs: CTA bilaterally, no wheezes, rhonchi, or rales Abdomen: +bs, soft, non tender, non distended, no masses, no hepatomegaly, no splenomegaly Musculoskeletal: nontender, no swelling, no  obvious deformity Extremities: no edema, no cyanosis, no clubbing Pulses: 2+ symmetric, upper and lower extremities, normal cap refill Neurological: alert, oriented x 3, CN2-12  intact, strength normal upper extremities and lower extremities, sensation normal throughout, DTRs 2+ throughout, no cerebellar signs, gait normal Psychiatric: normal affect, behavior normal, pleasant   Medicare Attestation I have personally reviewed: The patient's medical and social history Their use of alcohol, tobacco or illicit drugs Their current medications and supplements The patient's functional ability including ADLs,fall risks, home safety risks, cognitive, and hearing and visual impairment Diet and physical activities Evidence for depression or mood disorders  The patient's weight, height, BMI, and visual acuity have been recorded in the chart.  I have made referrals, counseling, and provided education to the patient based on review of the above and I have provided the patient with a written personalized care plan for preventive services.     Darrol Jump, NP   07/09/2022

## 2022-07-10 LAB — COMPLETE METABOLIC PANEL WITH GFR
AG Ratio: 1.5 (calc) (ref 1.0–2.5)
ALT: 17 U/L (ref 6–29)
AST: 22 U/L (ref 10–35)
Albumin: 4.2 g/dL (ref 3.6–5.1)
Alkaline phosphatase (APISO): 77 U/L (ref 37–153)
BUN/Creatinine Ratio: 21 (calc) (ref 6–22)
BUN: 31 mg/dL — ABNORMAL HIGH (ref 7–25)
CO2: 29 mmol/L (ref 20–32)
Calcium: 8.9 mg/dL (ref 8.6–10.4)
Chloride: 102 mmol/L (ref 98–110)
Creat: 1.5 mg/dL — ABNORMAL HIGH (ref 0.50–1.05)
Globulin: 2.8 g/dL (calc) (ref 1.9–3.7)
Glucose, Bld: 86 mg/dL (ref 65–99)
Potassium: 4.5 mmol/L (ref 3.5–5.3)
Sodium: 141 mmol/L (ref 135–146)
Total Bilirubin: 0.4 mg/dL (ref 0.2–1.2)
Total Protein: 7 g/dL (ref 6.1–8.1)
eGFR: 38 mL/min/{1.73_m2} — ABNORMAL LOW (ref >=60)

## 2022-07-10 LAB — LIPID PANEL
Cholesterol: 125 mg/dL (ref <200)
HDL: 50 mg/dL (ref >=50)
LDL Cholesterol (Calc): 54 mg/dL (calc)
Non-HDL Cholesterol (Calc): 75 mg/dL (calc) (ref <130)
Total CHOL/HDL Ratio: 2.5 (calc) (ref <5.0)
Triglycerides: 119 mg/dL (ref <150)

## 2022-07-10 LAB — CBC WITH DIFFERENTIAL/PLATELET
Absolute Monocytes: 770 cells/uL (ref 200–950)
Basophils Absolute: 50 cells/uL (ref 0–200)
Basophils Relative: 0.5 %
Eosinophils Absolute: 370 cells/uL (ref 15–500)
Eosinophils Relative: 3.7 %
HCT: 37.5 % (ref 35.0–45.0)
Hemoglobin: 12.2 g/dL (ref 11.7–15.5)
Lymphs Abs: 3330 cells/uL (ref 850–3900)
MCH: 27.8 pg (ref 27.0–33.0)
MCHC: 32.5 g/dL (ref 32.0–36.0)
MCV: 85.4 fL (ref 80.0–100.0)
MPV: 10.1 fL (ref 7.5–12.5)
Monocytes Relative: 7.7 %
Neutro Abs: 5480 cells/uL (ref 1500–7800)
Neutrophils Relative %: 54.8 %
Platelets: 299 10*3/uL (ref 140–400)
RBC: 4.39 10*6/uL (ref 3.80–5.10)
RDW: 13.7 % (ref 11.0–15.0)
Total Lymphocyte: 33.3 %
WBC: 10 10*3/uL (ref 3.8–10.8)

## 2022-07-10 LAB — HEMOGLOBIN A1C
Hgb A1c MFr Bld: 6.1 % of total Hgb — ABNORMAL HIGH (ref <5.7)
Mean Plasma Glucose: 128 mg/dL
eAG (mmol/L): 7.1 mmol/L

## 2022-07-12 ENCOUNTER — Telehealth: Payer: Self-pay | Admitting: Nurse Practitioner

## 2022-07-12 ENCOUNTER — Other Ambulatory Visit: Payer: Self-pay | Admitting: Nurse Practitioner

## 2022-07-12 DIAGNOSIS — E1169 Type 2 diabetes mellitus with other specified complication: Secondary | ICD-10-CM

## 2022-07-12 MED ORDER — OZEMPIC (0.25 OR 0.5 MG/DOSE) 2 MG/3ML ~~LOC~~ SOPN: PEN_INJECTOR | SUBCUTANEOUS | 3 refills | Status: DC

## 2022-07-12 NOTE — Telephone Encounter (Signed)
Pt said she requested Tonya send in her Ozempic [facility] mg to [facility] on [address]. The pharmacy told her she may need a prior authorization

## 2022-07-12 NOTE — Addendum Note (Signed)
Addended by: Chancy Hurter on: 07/12/2022 11:53 AM   Modules accepted: Orders

## 2022-07-20 ENCOUNTER — Other Ambulatory Visit: Payer: Self-pay | Admitting: Nurse Practitioner

## 2022-07-20 DIAGNOSIS — E1169 Type 2 diabetes mellitus with other specified complication: Secondary | ICD-10-CM

## 2022-07-22 ENCOUNTER — Telehealth: Payer: Self-pay

## 2022-07-22 ENCOUNTER — Telehealth: Payer: Self-pay | Admitting: Nurse Practitioner

## 2022-07-22 ENCOUNTER — Other Ambulatory Visit: Payer: Self-pay

## 2022-07-22 DIAGNOSIS — E1169 Type 2 diabetes mellitus with other specified complication: Secondary | ICD-10-CM

## 2022-07-22 MED ORDER — OZEMPIC (0.25 OR 0.5 MG/DOSE) 2 MG/3ML ~~LOC~~ SOPN: PEN_INJECTOR | SUBCUTANEOUS | 0 refills | Status: DC

## 2022-07-22 NOTE — Telephone Encounter (Signed)
PA approved and faxed to the pharmacy. 

## 2022-07-22 NOTE — Telephone Encounter (Signed)
PA for Ozempic completed and faxed directly to [facility]

## 2022-07-22 NOTE — Telephone Encounter (Signed)
Pt is following up on Pa request for Ozempic, she called her insurance and they said they do not have anything.

## 2022-07-26 ENCOUNTER — Other Ambulatory Visit: Payer: Self-pay | Admitting: Internal Medicine

## 2022-07-31 DIAGNOSIS — R92313 Mammographic fatty tissue density, bilateral breasts: Secondary | ICD-10-CM | POA: Diagnosis not present

## 2022-07-31 DIAGNOSIS — Z803 Family history of malignant neoplasm of breast: Secondary | ICD-10-CM | POA: Diagnosis not present

## 2022-08-07 NOTE — Telephone Encounter (Signed)
Entered in error

## 2022-08-14 DIAGNOSIS — N762 Acute vulvitis: Secondary | ICD-10-CM | POA: Diagnosis not present

## 2022-08-14 DIAGNOSIS — Z01419 Encounter for gynecological examination (general) (routine) without abnormal findings: Secondary | ICD-10-CM | POA: Diagnosis not present

## 2022-08-22 ENCOUNTER — Other Ambulatory Visit: Payer: Self-pay | Admitting: Internal Medicine

## 2022-08-27 ENCOUNTER — Other Ambulatory Visit: Payer: Self-pay | Admitting: Nurse Practitioner

## 2022-08-27 DIAGNOSIS — I10 Essential (primary) hypertension: Secondary | ICD-10-CM

## 2022-09-09 ENCOUNTER — Ambulatory Visit: Payer: PPO | Admitting: Nurse Practitioner

## 2022-09-11 DIAGNOSIS — M8589 Other specified disorders of bone density and structure, multiple sites: Secondary | ICD-10-CM | POA: Diagnosis not present

## 2022-09-11 DIAGNOSIS — Z780 Asymptomatic menopausal state: Secondary | ICD-10-CM | POA: Diagnosis not present

## 2022-09-14 ENCOUNTER — Encounter (HOSPITAL_BASED_OUTPATIENT_CLINIC_OR_DEPARTMENT_OTHER): Payer: Self-pay

## 2022-09-17 DIAGNOSIS — M9903 Segmental and somatic dysfunction of lumbar region: Secondary | ICD-10-CM | POA: Diagnosis not present

## 2022-09-17 DIAGNOSIS — M5136 Other intervertebral disc degeneration, lumbar region: Secondary | ICD-10-CM | POA: Diagnosis not present

## 2022-09-17 DIAGNOSIS — M9901 Segmental and somatic dysfunction of cervical region: Secondary | ICD-10-CM | POA: Diagnosis not present

## 2022-09-17 DIAGNOSIS — M7592 Shoulder lesion, unspecified, left shoulder: Secondary | ICD-10-CM | POA: Diagnosis not present

## 2022-09-24 ENCOUNTER — Telehealth: Payer: Self-pay | Admitting: Nurse Practitioner

## 2022-09-24 NOTE — Telephone Encounter (Signed)
Pt is requesting refill on bisoprolol to [facility] [address]

## 2022-09-25 DIAGNOSIS — I10 Essential (primary) hypertension: Secondary | ICD-10-CM

## 2022-09-25 MED ORDER — SERTRALINE HCL 100 MG PO TABS: ORAL_TABLET | ORAL | 3 refills | Status: DC

## 2022-09-27 MED ORDER — BISOPROLOL FUMARATE 10 MG PO TABS: ORAL_TABLET | ORAL | 3 refills | Status: DC

## 2022-10-09 ENCOUNTER — Ambulatory Visit (INDEPENDENT_AMBULATORY_CARE_PROVIDER_SITE_OTHER): Payer: PPO | Admitting: Nurse Practitioner

## 2022-10-09 ENCOUNTER — Encounter: Payer: Self-pay | Admitting: Nurse Practitioner

## 2022-10-09 VITALS — BP 120/68 | HR 70 | Temp 98.1°F | Ht 63.0 in | Wt 216.0 lb

## 2022-10-09 DIAGNOSIS — Q8589 Other phakomatoses, not elsewhere classified: Secondary | ICD-10-CM | POA: Diagnosis not present

## 2022-10-09 DIAGNOSIS — K2100 Gastro-esophageal reflux disease with esophagitis, without bleeding: Secondary | ICD-10-CM | POA: Diagnosis not present

## 2022-10-09 DIAGNOSIS — E1169 Type 2 diabetes mellitus with other specified complication: Secondary | ICD-10-CM | POA: Diagnosis not present

## 2022-10-09 DIAGNOSIS — F99 Mental disorder, not otherwise specified: Secondary | ICD-10-CM

## 2022-10-09 DIAGNOSIS — F324 Major depressive disorder, single episode, in partial remission: Secondary | ICD-10-CM

## 2022-10-09 DIAGNOSIS — R5383 Other fatigue: Secondary | ICD-10-CM | POA: Diagnosis not present

## 2022-10-09 DIAGNOSIS — E559 Vitamin D deficiency, unspecified: Secondary | ICD-10-CM | POA: Diagnosis not present

## 2022-10-09 DIAGNOSIS — E538 Deficiency of other specified B group vitamins: Secondary | ICD-10-CM

## 2022-10-09 DIAGNOSIS — I10 Essential (primary) hypertension: Secondary | ICD-10-CM | POA: Diagnosis not present

## 2022-10-09 DIAGNOSIS — Z79899 Other long term (current) drug therapy: Secondary | ICD-10-CM

## 2022-10-09 DIAGNOSIS — G4733 Obstructive sleep apnea (adult) (pediatric): Secondary | ICD-10-CM | POA: Diagnosis not present

## 2022-10-09 DIAGNOSIS — E039 Hypothyroidism, unspecified: Secondary | ICD-10-CM | POA: Diagnosis not present

## 2022-10-09 DIAGNOSIS — E1122 Type 2 diabetes mellitus with diabetic chronic kidney disease: Secondary | ICD-10-CM | POA: Diagnosis not present

## 2022-10-09 DIAGNOSIS — J45909 Unspecified asthma, uncomplicated: Secondary | ICD-10-CM | POA: Diagnosis not present

## 2022-10-09 DIAGNOSIS — E611 Iron deficiency: Secondary | ICD-10-CM

## 2022-10-09 DIAGNOSIS — L659 Nonscarring hair loss, unspecified: Secondary | ICD-10-CM

## 2022-10-09 DIAGNOSIS — F5105 Insomnia due to other mental disorder: Secondary | ICD-10-CM

## 2022-10-09 DIAGNOSIS — E785 Hyperlipidemia, unspecified: Secondary | ICD-10-CM

## 2022-10-09 DIAGNOSIS — N183 Chronic kidney disease, stage 3 unspecified: Secondary | ICD-10-CM

## 2022-10-09 MED ORDER — VITAMIN D (ERGOCALCIFEROL) 1.25 MG (50000 UNIT) PO CAPS: ORAL_CAPSULE | ORAL | 2 refills | Status: DC

## 2022-10-09 MED ORDER — SEMAGLUTIDE (1 MG/DOSE) 4 MG/3ML ~~LOC~~ SOPN: 1.0000 mg | PEN_INJECTOR | SUBCUTANEOUS | 2 refills | Status: DC

## 2022-10-09 NOTE — Progress Notes (Signed)
FOLLOW UP  Assessment:   Primary hypertension Continue Bisoprolol, Olmesartan, Furosemide  Discussed DASH (Dietary Approaches to Stop Hypertension) DASH diet is lower in sodium than a typical American diet. Cut back on foods that are high in saturated fat, cholesterol, and trans fats. Eat more whole-grain foods, fish, poultry, and nuts Remain active and exercise as tolerated daily.  Monitor BP at home-Call if greater than 130/80.  Check CMP/CBC    Mixed hyperlipidemia associated with DMII Continue Rosuvastatin Discussed lifestyle modifications. Recommended diet heavy in fruits and veggies, omega 3's. Decrease consumption of animal meats, cheeses, and dairy products. Remain active and exercise as tolerated. Continue to monitor. Check lipids/TSH  Gastroesophageal reflux disease with esophagitis Continue Pantoprazole PRN. No suspected reflux complications ([place]/stricture). Lifestyle modification:  wt loss, avoid meals 2-3h before bedtime. Consider eliminating food triggers:  chocolate, caffeine, EtOH, acid/spicy food.   Type 2 diabetes mellitus with stage 2 chronic kidney disease, without long-term current use of insulin (HCC) Increase Semaglutide to 1 mg/week Education: Reviewed 'ABCs' of diabetes management  Discussed goals to be met and/or maintained include A1C (<7) Blood pressure (<130/80) Cholesterol (LDL <70) Continue Eye Exam yearly  Continue Dental Exam Q6 mo Discussed dietary recommendations Discussed Physical Activity recommendations Foot exam UTD Check A1C    CKD stage 3 due to type 2 diabetes mellitus ([place]) Discussed how what you eat and drink can aide in kidney protection. Stay well hydrated. Avoid high salt foods. Avoid NSAIDS. Keep BP and BG well controlled.   Take medications as prescribed. Remain active and exercise as tolerated daily. Maintain weight.  Continue to monitor. 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  Sleep hygiene. Encouraged personality growth wand development through coping techniques and problem-solving skills. Limit/Decrease/Monitor drug/alcohol intake.      Hypothyroidism Controlled. Continue Levothyroxine. Reminded to take on an empty stomach 30-82mns before food.  Stop any Biotin Supplement 48-72 hours before next TSH level to reduce the risk of falsely low TSH levels. Continue to monitor.      Obstructive sleep apnea syndrome On CPAP with restorative sleep; neuro follows Weight loss encouraged Continue to monitor   Obesity, morbid (HCC) BMI 35 Discussed appropriate BMI Goal of losing 1 lb per month. Diet modification. Physical activity. Encouraged/praised to build confidence. Increase dose in semaglutide from 0.5 mg/week to 1 mg/week.    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 All questions and concerns regarding medications addressed.    Hair loss/other fatigue/iron deficiency Monitor TSH Check Zinc and Iron levels  B12 Deficiency Monitor levels   Future Appointments  Date Time Provider [place] 10/18/2022 11:00 AM CBuford Dresser MD [facility]  12/27/2022  3:30 PM [facility] DX DEXA 1 GI-BCGDG GI-BREAST CE  03/03/2023  3:00 PM WAlycia Rossetti NP [facility]-GAAIM None     Subjective:  Kristina Madduxis a 67y.o. female who presents for 6 month follow up. She has Cough; Hypertension; GERD (gastroesophageal reflux disease); Asthma; Major depression in partial remission ([place]; Hyperlipidemia associated with type 2 diabetes  mellitus ([place]; Type 2 diabetes mellitus ([place]; CKD stage 3 due to type 2 diabetes mellitus ([place]; Sturge-Weber syndrome (  [place]); Lower back pain; Obesity hypoventilation syndrome ([place]); Family history of heart disease; Insomnia due to other mental disorder; Severe obstructive sleep apnea-hypopnea syndrome; Exposure to second hand tobacco smoke; Cough variant asthma; RBBB; Iron deficiency; Hypothyroidism; and History of dysplastic nevus on their problem list.   She is married, no children, retired from Therapist, art.   Overall she reports feeling well today.  She is concerned for increase in hair loss, fatigue.  Has Hypothyroidism, controlled on Levothyroxine.  Last TSH 02/2022 WNL.  She is not on a multivitamin or zinc supplement. Does have a hx of IDA.   She has some depression following retirement and mother's passing, zoloft 100 mg does help, also takes amitriptyline 10 mg PRN.   She has cough variant asthma, on breztri (but admits takes intermittently PRN with allergies), singulaire, albuterol PRN.  On protonix 40 mg daily.   She follows with Dr. Willis Modena for PAP, follows with Dr. Isaiah Blakes for mammograms due to hx of papillary breast disease.   She had sleep study 08/2020 by Dr. Brett Fairy showing sever OSA, now on CPAP and reports doing well, reports 100% compliance.   BMI is Body mass index is 38.26 kg/m., she has not been working on diet and exercise but was off Ozempic for several months which caused weight gain.  She has been on a stationary dose of 0.5 mg weekly for the last several months.  Wt Readings from Last 3 Encounters:  10/09/22 216 lb (98 kg)  07/09/22 209 lb 12.8 oz (95.2 kg)  02/28/22 216 lb 9.6 oz (98.2 kg)    Her blood pressure has been controlled at home, today their BP is BP: 120/68  Did see cardiology Dr. Harrell Gave, had CT coronary calcium of 6 on 05/2020.     She does not workout. She denies chest pain, shortness of breath, dizziness.   She is not on  cholesterol medication and denies myalgias. Her cholesterol is at goal. The cholesterol last visit was:   Lab Results  Component Value Date   CHOL 125 07/09/2022   HDL 50 07/09/2022   LDLCALC 54 07/09/2022   TRIG 119 07/09/2022   CHOLHDL 2.5 07/09/2022   She has not been working on diet and exercise for DM, prediabetes, and denies polydipsia and polyuria.  Currently on Ozempic 0.5 mg/week. Last A1C in the office was:  Lab Results  Component Value Date   HGBA1C 6.1 (H) 07/09/2022   Last GFR: Lab Results  Component Value Date   EGFR 38 (L) 07/09/2022    She is on thyroid medication. Her medication was not changed last visit.  She is taking synthroid 4mg daily.   Lab Results  Component Value Date   TSH 3.87 02/28/2022      Patient is on Vitamin D supplement.   Lab Results  Component Value Date   VD25OH 91 02/28/2022      Medication Review: Current Outpatient Medications on File Prior to Visit  Medication Sig Dispense Refill   acetaminophen (TYLENOL) 500 MG tablet Take 1,000 mg by mouth every 6 (six) hours as needed for mild pain, moderate pain or headache.     albuterol (VENTOLIN HFA) 108 (90 Base) MCG/ACT inhaler INHALE 2 PUFFS INTO THE LUNGS EVERY 6 HOURS AS NEEDED FOR WHEEZE 8.5 each 1   amitriptyline (ELAVIL) 10 MG tablet Take 1 tablet (10 mg total) by mouth at bedtime as needed. 90 tablet 3   aspirin EC 81 MG tablet Take 1 tablet (81 mg total) by  mouth daily. Swallow whole. 90 tablet 3   benzonatate (TESSALON) 200 MG capsule Take 1 cap 3 times a day as needed for cough. 60 capsule 1   bisoprolol (ZEBETA) 10 MG tablet TAKE 1 TABLET BY MOUTH EVERY DAY FOR BLOOD PRESSURE 90 tablet 3   Budeson-Glycopyrrol-Formoterol (BREZTRI AEROSPHERE) 160-9-4.8 MCG/ACT AERO Inhale 2 puffs into the lungs in the morning and at bedtime. Rinse mouth/gargle after each use. 5.9 g 2   FREESTYLE LITE test strip USE TO CHECK BLOOD SUGAR ONCE DAILY. DX: E11.22, N18.2 100 strip 2   furosemide  (LASIX) 40 MG tablet TAKE 1 TABLET DAILY FOR BLOOD PRESSURE & FLUID RETENTION / ANKLE SWELLING 90 tablet 3   Lancets MISC Use to check blood sugar once daily. Dx: E11.22, N18.2 100 each 2   levothyroxine (SYNTHROID) 75 MCG tablet Take 1 tablet Daily on an empty stomach with only water for 30 minutes & no Antacid meds, Calcium or Magnesium for 4 hours & avoid Biotin 90 tablet 3   methocarbamol (ROBAXIN) 500 MG tablet Take 1 tablet (500 mg total) by mouth every 8 (eight) hours as needed for muscle spasms. 60 tablet 0   montelukast (SINGULAIR) 10 MG tablet TAKE 1 TABLET BY MOUTH DAILY FOR ALLERGIES 90 tablet 3   olmesartan (BENICAR) 40 MG tablet TAKE 1 TABLET DAILY FOR BLOOD PRESSURE & DIABETIC KIDNEY PROTECTION 30 tablet 5   pantoprazole (PROTONIX) 40 MG tablet TAKE 1 TABLET DAILY FOR HEARTBURN & INDIGESTION 90 tablet 3   rosuvastatin (CRESTOR) 10 MG tablet TAKE 1 TABLET BY MOUTH 3 TIMES A WEEK FOR CHOLESTEROL 36 tablet 4   Semaglutide,0.25 or 0.5MG/DOS, (OZEMPIC, 0.25 OR 0.5 MG/DOSE,) 2 MG/3ML SOPN Inject 0.66m into the Skin every 7 days for Diabetes (Dx: e11.69) 9 mL 0   sertraline (ZOLOFT) 100 MG tablet TAKE 1 TABLET DAILY FOR DEPRESSION/ANXIETY 90 tablet 3   Vitamin D, Ergocalciferol, (DRISDOL) 1.25 MG (50000 UNIT) CAPS capsule TAKE 1 CAPSULE BY MOUTH 2 DAYS A WEEK 24 capsule 2   amLODipine (NORVASC) 5 MG tablet TAKE 1 TABLET (5 MG TOTAL) BY MOUTH DAILY. (Patient not taking: Reported on 10/09/2022) 90 tablet 3   metFORMIN (GLUCOPHAGE-XR) 500 MG 24 hr tablet Take 1 tablet (500 mg total) by mouth daily with supper. 90 tablet 3   No current facility-administered medications on file prior to visit.    Allergies  Allergen Reactions   Levaquin [Levofloxacin In D5w] Other (See Comments)    Ankles hurt really bad ? ACHILLES TENDON ?   Doxycycline Other (See Comments)    UNSPECIFIED REACTION     Current Problems (verified) Patient Active Problem List   Diagnosis Date Noted   History of  dysplastic nevus 09/26/2021   Iron deficiency 01/26/2021   Hypothyroidism    RBBB 01/25/2021   Cough variant asthma 01/18/2021   Insomnia due to other mental disorder 09/18/2020   Severe obstructive sleep apnea-hypopnea syndrome 09/18/2020   Exposure to second hand tobacco smoke 09/18/2020   Family history of heart disease 05/09/2020   Obesity hypoventilation syndrome ([place] 08/05/2019   Lower back pain 07/17/2015   [place]urge-Weber syndrome ([facility] 03/29/2015   CKD stage 3 due to type 2 diabetes mellitus ([place] 04/15/2014   Hyperlipidemia associated with type 2 diabetes mellitus ([place]    Type 2 diabetes mellitus ([place]    Asthma    Major depression in partial remission ([place]    Hypertension    GERD (gastroesophageal reflux disease)    Cough 12/28/2012  Screening Tests Immunization History  Administered Date(s) Administered   Influenza Inj Mdck Quad With Preservative 06/14/2020   Influenza Split 04/26/2013, 05/29/2015   Influenza Whole 05/26/2012   Influenza, High Dose Seasonal PF 07/09/2022   Influenza,inj,Quad PF,6+ Mos 06/11/2021   Influenza-Unspecified 05/20/2017, 05/19/2018, 05/21/2019   PFIZER(Purple Top)SARS-COV-2 Vaccination 10/22/2019, 11/26/2019, 05/30/2020   Pneumococcal Polysaccharide-23 09/10/2016   Td 08/27/2007, 10/22/2017     Patient Care Team: Unk Pinto, MD as PCP - General (Internal Medicine) Buford Dresser, MD as PCP - Cardiology (Cardiology) Newt Minion, MD as Consulting Physician (Orthopedic Surgery) Love, Alyson Locket, MD as Consulting Physician (Neurology) Ralene Bathe, MD as Consulting Physician (Ophthalmology) Gastroenterology, Sadie Haber as Consulting Physician (Gastroenterology)  SURGICAL HISTORY She  has a past surgical history that includes Back surgery; Ankle ganglion cyst excision (Left); birth mark treatments; Breast lumpectomy with radioactive seed localization (Left, 02/02/2016); Breast surgery (Right); Colon surgery (2008);  Cholecystectomy (N/A, 02/20/2017); and Cataract extraction (Right, 12/2016). FAMILY HISTORY Her family history includes Allergies in her mother; Asthma in her mother; Brain cancer in her father; Breast cancer in her mother; Colon cancer in her paternal grandmother; Emphysema in her mother; Heart disease in her brother, mother, and sister; Hypertension in her brother and brother; Prostate cancer in her father; Rheumatic fever in her brother; Thyroid disease in her brother; Transient ischemic attack in her sister. SOCIAL HISTORY She  reports that she has never smoked. She has never used smokeless tobacco. She reports that she does not drink alcohol and does not use drugs.  Review of Systems  Constitutional: Negative.   HENT: Negative.    Eyes: Negative.   Respiratory: Negative.    Cardiovascular: Negative.   Gastrointestinal: Negative.   Genitourinary: Negative.   Musculoskeletal: Negative.   Skin:  Negative for rash.  Neurological: Negative.   Psychiatric/Behavioral:  Positive for depression. The patient is nervous/anxious.      Objective:     Today's Vitals   10/09/22 1519  BP: 120/68  Pulse: 70  Temp: 98.1 F (36.7 C)  SpO2: 99%  Weight: 216 lb (98 kg)  Height: 5' 3"$  (1.6 m)   Body mass index is 38.26 kg/m.  General appearance: alert, no distress, WD/WN, female thinning hair HEENT: normocephalic, sclerae anicteric, TMs pearly, nares patent, no discharge or erythema, pharynx normal Oral cavity: MMM, no lesions Neck: supple, no lymphadenopathy, no thyromegaly, no masses Heart: RRR, normal S1, S2, no murmurs Lungs: CTA bilaterally, no wheezes, rhonchi, or rales Abdomen: +bs, soft, non tender, non distended, no masses, no hepatomegaly, no splenomegaly Musculoskeletal: nontender, no swelling, no obvious deformity Extremities: no edema, no cyanosis, no clubbing Pulses: 2+ symmetric, upper and lower extremities, normal cap refill Neurological: alert, oriented x 3, CN2-12  intact, strength normal upper extremities and lower extremities, sensation normal throughout, DTRs 2+ throughout, no cerebellar signs, gait normal Psychiatric: normal affect, behavior normal, pleasant   Onix Jumper, NP   10/09/2022

## 2022-10-09 NOTE — Patient Instructions (Signed)
Alopecia Areata, Adult  Alopecia areata is a condition that causes hair loss. A person with this condition may lose hair on the scalp in patches. In some cases, a person may lose all the hair on the scalp or all the hair from the face and body. Having this condition can be emotionally difficult, but it is not dangerous. Alopecia areata is an autoimmune disease. This means that your body's defense system (immune system) mistakes normal parts of the body for germs or other things that can make you sick. When you have alopecia areata, the immune system attacks the hair follicles. What are the causes? The cause of this condition is not known. What increases the risk? You are more likely to develop this condition if you have: A family history of alopecia. A family history of another autoimmune disease, including type 1 diabetes and thyroid autoimmune disease. Eczema, asthma, and allergies. Down syndrome. What are the signs or symptoms? The main symptom of this condition is round spots of patchy hair loss on the scalp. The spots may be mildly itchy. Other symptoms include: Short dark hairs in the bald patches that are wider at the top (exclamation point hairs). Dents, white spots, or lines in the fingernails or toenails. Balding and body hair loss. This is rare. Alopecia areata usually develops in childhood, but it can develop at any age. For some people, their hair grows back on its own and hair loss does not happen again. For others, their hair may fall out and grow back in cycles. The hair loss may last many years. How is this diagnosed? This condition is diagnosed based on your symptoms and family history. Your health care provider will also check your scalp skin, teeth, and nails. Your health care provider may refer you to a specialist in hair and skin disorders (dermatologist). You may also have tests, including: A hair pull test. Blood tests or other screening tests to check for autoimmune  diseases, such as thyroid disease or diabetes. Skin biopsy to confirm the diagnosis. A procedure to examine the skin with a lighted magnifying instrument (dermoscopy). How is this treated? There is no cure for alopecia areata. The goals of treatment are to promote the regrowth of hair and prevent the immune system from overreacting. No single treatment is right for all people with alopecia areata. It depends on the type of hair loss you have and how severe it is. Work with your health care provider to find the best treatment for you. Treatment may include: Regular checkups to make sure the condition is not getting worse . This is called watchful waiting. Using steroid creams or pills for 6-8 weeks to stop the immune reaction and help hair to regrow more quickly. Using other medicines on your skin (topical medicines) to change the immune system response and support the hair growth cycle. Steroid injections. Therapy and counseling with a support group or therapist if you are having trouble coping with hair loss. Follow these instructions at home: Medicines Apply topical creams only as told by your health care provider. Take over-the-counter and prescription medicines only as told by your health care provider. General instructions Learn as much as you can about your condition. Consider getting a wig or products to make hair look fuller or to cover bald spots, if you feel uncomfortable with your appearance. Get therapy or counseling if you are having a hard time coping with hair loss. Ask your health care provider to recommend a counselor or support group. Keep  all follow-up visits as told by your health care provider. This is important. Where to find more information [employer] [email or web address] Contact a health care provider if: Your hair loss gets worse, even with treatment. You have new symptoms. You are struggling emotionally. Get help right away if: You have a sudden  worsening of the hair loss. Summary Alopecia areata is an autoimmune condition that makes your body's defense system (immune system) attack the hair follicles. This causes you to lose hair. Having this condition can be emotionally difficult, but it is not dangerous. Treatments may include regular checkups to make sure that the condition is not getting worse, medicines, and steroid injections. This information is not intended to replace advice given to you by your health care provider. Make sure you discuss any questions you have with your health care provider. Document Revised: 10/26/2019 Document Reviewed: 10/26/2019 Elsevier Patient Education  [place].

## 2022-10-10 ENCOUNTER — Other Ambulatory Visit: Payer: Self-pay | Admitting: Nurse Practitioner

## 2022-10-10 DIAGNOSIS — E611 Iron deficiency: Secondary | ICD-10-CM

## 2022-10-10 MED ORDER — FERROUS SULFATE 325 (65 FE) MG PO TABS: 325.0000 mg | ORAL_TABLET | Freq: Every day | ORAL | 0 refills | Status: DC

## 2022-10-17 ENCOUNTER — Encounter: Payer: Self-pay | Admitting: Nurse Practitioner

## 2022-10-18 ENCOUNTER — Encounter (HOSPITAL_BASED_OUTPATIENT_CLINIC_OR_DEPARTMENT_OTHER): Payer: Self-pay | Admitting: Cardiology

## 2022-10-18 ENCOUNTER — Ambulatory Visit (HOSPITAL_BASED_OUTPATIENT_CLINIC_OR_DEPARTMENT_OTHER): Payer: PPO | Admitting: Cardiology

## 2022-10-18 VITALS — BP 97/65 | HR 65 | Ht 63.0 in | Wt 217.0 lb

## 2022-10-18 DIAGNOSIS — Z7189 Other specified counseling: Secondary | ICD-10-CM | POA: Diagnosis not present

## 2022-10-18 DIAGNOSIS — I10 Essential (primary) hypertension: Secondary | ICD-10-CM | POA: Diagnosis not present

## 2022-10-18 DIAGNOSIS — E119 Type 2 diabetes mellitus without complications: Secondary | ICD-10-CM

## 2022-10-18 DIAGNOSIS — R072 Precordial pain: Secondary | ICD-10-CM

## 2022-10-18 DIAGNOSIS — E782 Mixed hyperlipidemia: Secondary | ICD-10-CM

## 2022-10-18 NOTE — Progress Notes (Signed)
Cardiology Office Note:    Date:  10/18/2022   ID:  Kristina Webb, DOB 06-14-1956, MRN [id number]  PCP:  Unk Pinto, MD  Cardiologist:  Buford Dresser, MD  Referring MD: Unk Pinto, MD   CC: follow-up  History of Present Illness:    Kristina Webb is a 67 y.o. female with a hx of hypertension, hyperlipidemia, asthma, OSA not on CPAP who is seen for follow-up. I initially met her 05/09/2020 as a new consult at the request of Unk Pinto, MD for the evaluation and management of chest pressure and shortness of breath. Seen in the past by Dr. Wynonia Lawman.   CV risk -Tobacco: she is not a smoker, but both parents smoked, husband smokes -Comorbidities: hypertension, hyperlipidemia, type II diabetes, OSA not on CPAP, family history, secondhand smoke exposure -Family history: mother had heart failure, breast cancer/womb cancer, high blood pressure, diabetes, hypothyroidism, died age 9. Father had high blood pressure, died of brain tumor. Brother had rheumatic fever, has had valve issues throughout his life. Sister is 4, has had stents for CAD, had a mild stroke, has HTN, no diabetes.  At her last appointment, she complained of intermittent central chest pains and aches. She had not been taking her antihypertensives for a long time due to low blood pressures. She was struggling with vomiting and poor oral intake since her Ozempic was increased. Recommended that she hold her furosemide and benicar until she began taking in and tolerating normal food and drink, and then restart. We discussed aspirin given CAC and diabetes. She was amenable to starting aspirin and planned to monitor for bleeding.  Today, she complains of a deep ache in her central chest that "goes in waves", with pain radiating to above her left breast. This chest pain comes and goes. It has lasted for up to several hours at a time. She has tried pressing and holding her chest; she is not certain if this helps, or if she  has more soreness that is attributable to pressing too hard and long. Recently she was started on an iron supplement last week which she believes may be related as it is known to cause gastric upset. Additionally she notes possibly having a minor injury in her left shoulder, and wonders if the pain is originating from the shoulder.  She believes she is retaining fluid. She is waking up in the mornings with tightness in her fingers and hands, sometimes making it difficult to close her fist. She does admit to eating more salty foods such as potato chips. Of note, she is trying to take her diuretic every other day as she is concerned about her kidney function. She struggles with drinking enough water throughout the day (usually 20 oz of water a day, otherwise sips on diet coke or tea).  Lately she is feeling extremely tired. The other day she changed the light bulb in the kitchen which left her exhausted.  In the past couple weeks she has also experienced occasional lightheadedness and dizziness. In clinic today her blood pressure is 97/65. She has seen low readings like this every once in a while.  Currently she is taking 1 mg Ozempic. She has to monitor her constipation. Usually she takes Benefiber.  She denies any palpitations, shortness of breath, headaches, syncope, orthopnea, or PND.   Past Medical History:  Diagnosis Date   Anemia    hx   Anxiety    Arthritis    back   Asthma    Depression  Deviated septum    GERD (gastroesophageal reflux disease)    Headache    History of hiatal hernia    Hyperlipemia    Hypertension    Hypothyroidism    OSA (obstructive sleep apnea)    does not use -last 6 months   Pre-diabetes    per pt    Past Surgical History:  Procedure Laterality Date   ANKLE GANGLION CYST EXCISION Left    BACK SURGERY     L3-4 decompression   birth mark treatments     BREAST LUMPECTOMY WITH RADIOACTIVE SEED LOCALIZATION Left 02/02/2016   Procedure: LEFT BREAST  LUMPECTOMY WITH RADIOACTIVE SEED LOCALIZATION;  Surgeon: Autumn Messing III, MD;  Location: [facility];  Service: General;  Laterality: Left;   BREAST SURGERY Right    papilloma removed,cyst lft   CATARACT EXTRACTION Right 12/2016   Dr. Harlow Mares   CHOLECYSTECTOMY N/A 02/20/2017   Procedure: LAPAROSCOPIC CHOLECYSTECTOMY WITH INTRAOPERATIVE CHOLANGIOGRAM;  Surgeon: Jovita Kussmaul, MD;  Location: [facility];  Service: General;  Laterality: N/A;   COLON SURGERY  2008   resection- endometriosis    Current Medications: Current Outpatient Medications on File Prior to Visit  Medication Sig   acetaminophen (TYLENOL) 500 MG tablet Take 1,000 mg by mouth every 6 (six) hours as needed for mild pain, moderate pain or headache.   albuterol (VENTOLIN HFA) 108 (90 Base) MCG/ACT inhaler INHALE 2 PUFFS INTO THE LUNGS EVERY 6 HOURS AS NEEDED FOR WHEEZE   amitriptyline (ELAVIL) 10 MG tablet Take 1 tablet (10 mg total) by mouth at bedtime as needed.   aspirin EC 81 MG tablet Take 1 tablet (81 mg total) by mouth daily. Swallow whole.   benzonatate (TESSALON) 200 MG capsule Take 1 cap 3 times a day as needed for cough.   bisoprolol (ZEBETA) 10 MG tablet TAKE 1 TABLET BY MOUTH EVERY DAY FOR BLOOD PRESSURE   Budeson-Glycopyrrol-Formoterol (BREZTRI AEROSPHERE) 160-9-4.8 MCG/ACT AERO Inhale 2 puffs into the lungs in the morning and at bedtime. Rinse mouth/gargle after each use.   ferrous sulfate 325 (65 FE) MG tablet Take 1 tablet (325 mg total) by mouth daily.   FREESTYLE LITE test strip USE TO CHECK BLOOD SUGAR ONCE DAILY. DX: E11.22, N18.2   furosemide (LASIX) 40 MG tablet TAKE 1 TABLET DAILY FOR BLOOD PRESSURE & FLUID RETENTION / ANKLE SWELLING   Lancets MISC Use to check blood sugar once daily. Dx: E11.22, N18.2   levothyroxine (SYNTHROID) 75 MCG tablet Take 1 tablet Daily on an empty stomach with only water for 30 minutes & no Antacid meds, Calcium or Magnesium for 4 hours & avoid Biotin   methocarbamol (ROBAXIN) 500 MG tablet  Take 1 tablet (500 mg total) by mouth every 8 (eight) hours as needed for muscle spasms.   montelukast (SINGULAIR) 10 MG tablet TAKE 1 TABLET BY MOUTH DAILY FOR ALLERGIES   olmesartan (BENICAR) 40 MG tablet TAKE 1 TABLET DAILY FOR BLOOD PRESSURE & DIABETIC KIDNEY PROTECTION   pantoprazole (PROTONIX) 40 MG tablet TAKE 1 TABLET DAILY FOR HEARTBURN & INDIGESTION   rosuvastatin (CRESTOR) 10 MG tablet TAKE 1 TABLET BY MOUTH 3 TIMES A WEEK FOR CHOLESTEROL   Semaglutide, 1 MG/DOSE, 4 MG/3ML SOPN Inject 1 mg into the skin once a week.   sertraline (ZOLOFT) 100 MG tablet TAKE 1 TABLET DAILY FOR DEPRESSION/ANXIETY   Vitamin D, Ergocalciferol, (DRISDOL) 1.25 MG (50000 UNIT) CAPS capsule TAKE 1 CAPSULE BY MOUTH 2 DAYS A WEEK   No current facility-administered medications on  file prior to visit.     Allergies:   Levaquin [levofloxacin in d5w] and Doxycycline   Social History   Tobacco Use   Smoking status: Never   Smokeless tobacco: Never   Tobacco comments:    husband smokes in the house  Vaping Use   Vaping Use: Never used  Substance Use Topics   Alcohol use: No   Drug use: No    Family History: family history includes Allergies in her mother; Asthma in her mother; Brain cancer in her father; Breast cancer in her mother; Colon cancer in her paternal grandmother; Emphysema in her mother; Heart disease in her brother, mother, and sister; Hypertension in her brother and brother; Prostate cancer in her father; Rheumatic fever in her brother; Thyroid disease in her brother; Transient ischemic attack in her sister. There is no history of Allergic rhinitis, Angioedema, Eczema, Immunodeficiency, or Urticaria.  ROS:   Please see the history of present illness. (+) Chest pain (+) Fluid retention in hands (+) Fatigue (+) Lightheadedness/dizziness All other systems are reviewed and negative.    EKGs/Labs/Other Studies Reviewed:    The following studies were reviewed today:  Coronary CT  05/26/2020: FINDINGS: Coronary calcium score: The patient's coronary artery calcium score is 6, which places the patient in the 51 percentile.   Coronary arteries: Normal coronary origins.  Left dominance.   Right Coronary Artery: Small caliber, nondominant vessel. No significant plaque or stenosis.   Left Main Coronary Artery: Normal caliber vessel. No significant plaque or stenosis. Small ramus intermedius without significant plaque or stenosis.   Left Anterior Descending Coronary Artery: Normal caliber vessel. Small focus of calcified plaque in the proximal LAD without any stenosis. Gives rise to 1 small diagonal branch.   Left Circumflex Artery: Normal caliber vessel. No significant plaque or stenosis. Gives rise to 1 large OM branch.   Aorta: Normal size, 30 mm at the mid ascending aorta (level of the PA bifurcation) measured double oblique. No calcifications. No dissection.   Aortic Valve: No calcifications. Trileaflet.   Other findings:   Normal pulmonary vein drainage into the left atrium.   Normal left atrial appendage without a thrombus.   Normal size of the pulmonary artery.   IMPRESSION: 1. No evidence of CAD, CADRADS = 0. There is a single focus of calcium in the proximal LAD, but it does not obstruct the lumen at all.   2. Coronary calcium score of 6. This was 62 percentile for age and sex matched control.   3. Normal coronary origin with left dominance.  EKG:  EKG is personally reviewed.   10/18/2022:  NSR at 65 bpm with IVCD 05/16/2021: NSR at 74 bpm with IVCD 05/09/2020: NSR at 74 bpm with incomplete RBBB  Recent Labs: 10/09/2022: ALT 20; BUN 22; Creat 1.25; Hemoglobin 12.6; Magnesium 2.3; Platelets 306; Potassium 4.5; Sodium 140; TSH 1.94   Recent Lipid Panel    Component Value Date/Time   CHOL 150 10/09/2022 1610   TRIG 133 10/09/2022 1610   HDL 61 10/09/2022 1610   CHOLHDL 2.5 10/09/2022 1610   VLDL 25 03/24/2017 1643   LDLCALC 68 10/09/2022  1610    Physical Exam:    VS:  BP 97/65   Pulse 65   Ht '5\' 3"'$  (1.6 m)   Wt 217 lb (98.4 kg)   BMI 38.44 kg/m     Wt Readings from Last 3 Encounters:  10/18/22 217 lb (98.4 kg)  10/09/22 216 lb (98 kg)  07/09/22 209  lb 12.8 oz (95.2 kg)    GEN: Well nourished, well developed in no acute distress HEENT: Normal, moist mucous membranes NECK: No JVD CARDIAC: regular rhythm, normal S1 and S2, no rubs or gallops. No murmur. VASCULAR: Radial and DP pulses 2+ bilaterally. No carotid bruits RESPIRATORY:  Clear to auscultation without rales, wheezing or rhonchi  ABDOMEN: Soft, non-tender, non-distended MUSCULOSKELETAL:  Ambulates independently SKIN: Warm and dry, no edema NEUROLOGIC:  Alert and oriented x 3. No focal neuro deficits noted. PSYCHIATRIC:  Normal affect    ASSESSMENT:    1. Precordial pain   2. Mixed hyperlipidemia   3. Essential hypertension   4. Type 2 diabetes mellitus without complication, without long-term current use of insulin ([place])   5. Counseling on health promotion and disease prevention    PLAN:    Chest pain: -similar to prior, atypical, localized, nonexertional -reviewed prior CT results, reassuring that it is unlikely to be cardiac -did discuss red flag warning signs that need immediate medical attention  Coronary calcification: trivial, no obstruction. Prevention as below -continue aspirin, rosuvastatin  Hypertension:  -running low today -discussed checking home Bps. If remains low  Hyperlipidemia: -continue rosuvastatin -per [facility], lipids 10/09/22: Tchol 150, HDL 61, LDL 68, TG 133  Type II diabetes, not on insulin Obesity, BMI 38 -currently on semaglutide -aspirin, statin as above  Cardiac risk counseling and prevention recommendations: with family history of heart disease -recommend heart healthy/Mediterranean diet, with whole grains, fruits, vegetable, fish, lean meats, nuts, and olive oil. Limit salt. -recommend moderate walking, 3-5  times/week for 30-50 minutes each session. Aim for at least 150 minutes.week. Goal should be pace of 3 miles/hours, or walking 1.5 miles in 30 minutes -recommend avoidance of tobacco products. Avoid excess alcohol. -ASCVD risk score: The 10-year ASCVD risk score (Arnett DK, et al., 2019) is: 6.8%   Values used to calculate the score:     Age: 81 years     Sex: Female     Is Non-Hispanic African American: No     Diabetic: Yes     Tobacco smoker: No     Systolic Blood Pressure: 97 mmHg     Is BP treated: Yes     HDL Cholesterol: 61 mg/dL     Total Cholesterol: 150 mg/dL    Plan for follow up: One year or sooner as needed.  Buford Dresser, MD, PhD [place]  [facility]    Medication Adjustments/Labs and Tests Ordered: Current medicines are reviewed at length with the patient today.  Concerns regarding medicines are outlined above.   Orders Placed This Encounter  Procedures   EKG 12-Lead   No orders of the defined types were placed in this encounter.  Patient Instructions  Medication Instructions:  Continue current medications  *If you need a refill on your cardiac medications before your next appointment, please call your pharmacy*   Lab Work: None Ordered   Testing/Procedures: None Ordered   Follow-Up: At [facility], you and your health needs are our priority.  As part of our continuing mission to provide you with exceptional heart care, we have created designated Provider Care Teams.  These Care Teams include your primary Cardiologist (physician) and Advanced Practice Providers (APPs -  Physician Assistants and Nurse Practitioners) who all work together to provide you with the care you need, when you need it.  We recommend signing up for the patient portal called "MyChart".  Sign up information is provided on this After Visit Summary.  MyChart is used to connect with patients for Virtual Visits (Telemedicine).  Patients are able to view lab/test  results, encounter notes, upcoming appointments, etc.  Non-urgent messages can be sent to your provider as well.   To learn more about what you can do with MyChart, go to [email or web address].    Your next appointment:   1 year(s)  Provider:   Buford Dresser, MD    Other Instructions  I would like you to check home blood pressures occasionally, mainly if you are feeling poorly. I want to make sure it doesn't run too low. If you are using a wrist cuff, this often runs higher than an arm cuff, so if you see numbers in the 100s or lower on the top using a wrist cuff, let me know.  If you have issues with constipation, look into Miralax (generic is polyethelene glycol).    I,Mathew Stumpf,acting as a Education administrator for [employer], MD.,have documented all relevant documentation on the behalf of Buford Dresser, MD,as directed by  Buford Dresser, MD while in the presence of Buford Dresser, MD.  I, Buford Dresser, MD, have reviewed all documentation for this visit. The documentation on 10/18/22 for the exam, diagnosis, procedures, and orders are all accurate and complete.   Signed, Buford Dresser, MD PhD 10/18/2022  [place]

## 2022-10-18 NOTE — Patient Instructions (Addendum)
Medication Instructions:  Continue current medications  *If you need a refill on your cardiac medications before your next appointment, please call your pharmacy*   Lab Work: None Ordered   Testing/Procedures: None Ordered   Follow-Up: At [facility], you and your health needs are our priority.  As part of our continuing mission to provide you with exceptional heart care, we have created designated Provider Care Teams.  These Care Teams include your primary Cardiologist (physician) and Advanced Practice Providers (APPs -  Physician Assistants and Nurse Practitioners) who all work together to provide you with the care you need, when you need it.  We recommend signing up for the patient portal called "MyChart".  Sign up information is provided on this After Visit Summary.  MyChart is used to connect with patients for Virtual Visits (Telemedicine).  Patients are able to view lab/test results, encounter notes, upcoming appointments, etc.  Non-urgent messages can be sent to your provider as well.   To learn more about what you can do with MyChart, go to [email or web address].    Your next appointment:   1 year(s)  Provider:   Buford Dresser, MD    Other Instructions  I would like you to check home blood pressures occasionally, mainly if you are feeling poorly. I want to make sure it doesn't run too low. If you are using a wrist cuff, this often runs higher than an arm cuff, so if you see numbers in the 100s or lower on the top using a wrist cuff, let me know.  If you have issues with constipation, look into Miralax (generic is polyethelene glycol).

## 2022-10-25 LAB — COMPLETE METABOLIC PANEL WITH GFR
AG Ratio: 1.5 (calc) (ref 1.0–2.5)
ALT: 20 U/L (ref 6–29)
AST: 21 U/L (ref 10–35)
Albumin: 4.4 g/dL (ref 3.6–5.1)
Alkaline phosphatase (APISO): 73 U/L (ref 37–153)
BUN/Creatinine Ratio: 18 (calc) (ref 6–22)
BUN: 22 mg/dL (ref 7–25)
CO2: 25 mmol/L (ref 20–32)
Calcium: 9.4 mg/dL (ref 8.6–10.4)
Chloride: 103 mmol/L (ref 98–110)
Creat: 1.25 mg/dL — ABNORMAL HIGH (ref 0.50–1.05)
Globulin: 3 g/dL (calc) (ref 1.9–3.7)
Glucose, Bld: 95 mg/dL (ref 65–99)
Potassium: 4.5 mmol/L (ref 3.5–5.3)
Sodium: 140 mmol/L (ref 135–146)
Total Bilirubin: 0.3 mg/dL (ref 0.2–1.2)
Total Protein: 7.4 g/dL (ref 6.1–8.1)
eGFR: 48 mL/min/{1.73_m2} — ABNORMAL LOW (ref >=60)

## 2022-10-25 LAB — CBC WITH DIFFERENTIAL/PLATELET
Absolute Monocytes: 598 cells/uL (ref 200–950)
Basophils Absolute: 44 cells/uL (ref 0–200)
Basophils Relative: 0.5 %
Eosinophils Absolute: 229 cells/uL (ref 15–500)
Eosinophils Relative: 2.6 %
HCT: 38.8 % (ref 35.0–45.0)
Hemoglobin: 12.6 g/dL (ref 11.7–15.5)
Lymphs Abs: 2429 cells/uL (ref 850–3900)
MCH: 26.8 pg — ABNORMAL LOW (ref 27.0–33.0)
MCHC: 32.5 g/dL (ref 32.0–36.0)
MCV: 82.4 fL (ref 80.0–100.0)
MPV: 10.2 fL (ref 7.5–12.5)
Monocytes Relative: 6.8 %
Neutro Abs: 5500 cells/uL (ref 1500–7800)
Neutrophils Relative %: 62.5 %
Platelets: 306 10*3/uL (ref 140–400)
RBC: 4.71 10*6/uL (ref 3.80–5.10)
RDW: 14.4 % (ref 11.0–15.0)
Total Lymphocyte: 27.6 %
WBC: 8.8 10*3/uL (ref 3.8–10.8)

## 2022-10-25 LAB — IRON,TIBC AND FERRITIN PANEL
%SAT: 11 % (calc) — ABNORMAL LOW (ref 16–45)
Ferritin: 8 ng/mL — ABNORMAL LOW (ref 16–288)
Iron: 53 ug/dL (ref 45–160)
TIBC: 469 mcg/dL (calc) — ABNORMAL HIGH (ref 250–450)

## 2022-10-25 LAB — LIPID PANEL
Cholesterol: 150 mg/dL (ref <200)
HDL: 61 mg/dL (ref >=50)
LDL Cholesterol (Calc): 68 mg/dL (calc)
Non-HDL Cholesterol (Calc): 89 mg/dL (calc) (ref <130)
Total CHOL/HDL Ratio: 2.5 (calc) (ref <5.0)
Triglycerides: 133 mg/dL (ref <150)

## 2022-10-25 LAB — ZINC: Zinc: 74 ug/dL (ref 60–130)

## 2022-10-25 LAB — VITAMIN D 25 HYDROXY (VIT D DEFICIENCY, FRACTURES): Vit D, 25-Hydroxy: 72 ng/mL (ref 30–100)

## 2022-10-25 LAB — HEMOGLOBIN A1C
Hgb A1c MFr Bld: 6.5 % of total Hgb — ABNORMAL HIGH (ref <5.7)
Mean Plasma Glucose: 140 mg/dL
eAG (mmol/L): 7.7 mmol/L

## 2022-10-25 LAB — MAGNESIUM: Magnesium: 2.3 mg/dL (ref 1.5–2.5)

## 2022-10-25 LAB — VITAMIN B12: Vitamin B-12: 327 pg/mL (ref 200–1100)

## 2022-10-25 LAB — TSH: TSH: 1.94 mIU/L (ref 0.40–4.50)

## 2022-11-23 ENCOUNTER — Other Ambulatory Visit: Payer: Self-pay | Admitting: Nurse Practitioner

## 2022-12-09 ENCOUNTER — Encounter: Payer: Self-pay | Admitting: Nurse Practitioner

## 2022-12-24 ENCOUNTER — Other Ambulatory Visit: Payer: Self-pay | Admitting: Nurse Practitioner

## 2022-12-24 DIAGNOSIS — I10 Essential (primary) hypertension: Secondary | ICD-10-CM

## 2022-12-27 ENCOUNTER — Inpatient Hospital Stay: Admission: RE | Admit: 2022-12-27 | Payer: Self-pay | Source: Ambulatory Visit

## 2023-01-08 ENCOUNTER — Other Ambulatory Visit: Payer: Self-pay | Admitting: Nurse Practitioner

## 2023-01-08 DIAGNOSIS — E611 Iron deficiency: Secondary | ICD-10-CM

## 2023-01-09 ENCOUNTER — Encounter: Payer: Self-pay | Admitting: Nurse Practitioner

## 2023-01-09 ENCOUNTER — Ambulatory Visit (INDEPENDENT_AMBULATORY_CARE_PROVIDER_SITE_OTHER): Payer: PPO | Admitting: Nurse Practitioner

## 2023-01-09 VITALS — BP 96/70 | HR 73 | Temp 97.6°F | Ht 63.0 in | Wt 212.4 lb

## 2023-01-09 DIAGNOSIS — G4733 Obstructive sleep apnea (adult) (pediatric): Secondary | ICD-10-CM | POA: Diagnosis not present

## 2023-01-09 DIAGNOSIS — Q8589 Other phakomatoses, not elsewhere classified: Secondary | ICD-10-CM

## 2023-01-09 DIAGNOSIS — H60332 Swimmer's ear, left ear: Secondary | ICD-10-CM

## 2023-01-09 DIAGNOSIS — L659 Nonscarring hair loss, unspecified: Secondary | ICD-10-CM

## 2023-01-09 DIAGNOSIS — F324 Major depressive disorder, single episode, in partial remission: Secondary | ICD-10-CM | POA: Diagnosis not present

## 2023-01-09 DIAGNOSIS — K2100 Gastro-esophageal reflux disease with esophagitis, without bleeding: Secondary | ICD-10-CM

## 2023-01-09 DIAGNOSIS — N183 Chronic kidney disease, stage 3 unspecified: Secondary | ICD-10-CM

## 2023-01-09 DIAGNOSIS — Z79899 Other long term (current) drug therapy: Secondary | ICD-10-CM

## 2023-01-09 DIAGNOSIS — E1122 Type 2 diabetes mellitus with diabetic chronic kidney disease: Secondary | ICD-10-CM

## 2023-01-09 DIAGNOSIS — E559 Vitamin D deficiency, unspecified: Secondary | ICD-10-CM | POA: Diagnosis not present

## 2023-01-09 DIAGNOSIS — J45991 Cough variant asthma: Secondary | ICD-10-CM | POA: Diagnosis not present

## 2023-01-09 DIAGNOSIS — E039 Hypothyroidism, unspecified: Secondary | ICD-10-CM

## 2023-01-09 DIAGNOSIS — F99 Mental disorder, not otherwise specified: Secondary | ICD-10-CM

## 2023-01-09 DIAGNOSIS — E1169 Type 2 diabetes mellitus with other specified complication: Secondary | ICD-10-CM

## 2023-01-09 DIAGNOSIS — I10 Essential (primary) hypertension: Secondary | ICD-10-CM | POA: Diagnosis not present

## 2023-01-09 DIAGNOSIS — E785 Hyperlipidemia, unspecified: Secondary | ICD-10-CM

## 2023-01-09 DIAGNOSIS — F5105 Insomnia due to other mental disorder: Secondary | ICD-10-CM | POA: Diagnosis not present

## 2023-01-09 DIAGNOSIS — E538 Deficiency of other specified B group vitamins: Secondary | ICD-10-CM

## 2023-01-09 MED ORDER — CLOTRIMAZOLE-BETAMETHASONE 1-0.05 % EX CREA
1.0000 | TOPICAL_CREAM | Freq: Two times a day (BID) | CUTANEOUS | 2 refills | Status: AC
Start: 2023-01-09 — End: ?

## 2023-01-09 NOTE — Progress Notes (Signed)
FOLLOW UP  Assessment:   Primary hypertension Continue Bisoprolol, Olmesartan, Furosemide  Discussed DASH (Dietary Approaches to Stop Hypertension) DASH diet is lower in sodium than a typical American diet. Cut back on foods that are high in saturated fat, cholesterol, and trans fats. Eat more whole-grain foods, fish, poultry, and nuts Remain active and exercise as tolerated daily.  Monitor BP at home-Call if greater than 130/80.  Check CMP/CBC   Mixed hyperlipidemia associated with DMII Continue Rosuvastatin Discussed lifestyle modifications. Recommended diet heavy in fruits and veggies, omega 3's. Decrease consumption of animal meats, cheeses, and dairy products. Remain active and exercise as tolerated. Continue to monitor. Check lipids/TSH  Gastroesophageal reflux disease with esophagitis Continue Pantoprazole PRN. No suspected reflux complications (Barret/stricture). Lifestyle modification:  wt loss, avoid meals 2-3h before bedtime. Consider eliminating food triggers:  chocolate, caffeine, EtOH, acid/spicy food.   Type 2 diabetes mellitus with stage 2 chronic kidney disease, without long-term current use of insulin (HCC) Increase Semaglutide to 1 mg/week Education: Reviewed 'ABCs' of diabetes management  Discussed goals to be met and/or maintained include A1C (<7) Blood pressure (<130/80) Cholesterol (LDL <70) Continue Eye Exam yearly  Continue Dental Exam Q6 mo Discussed dietary recommendations Discussed Physical Activity recommendations Foot exam UTD Check A1C  CKD stage 3 due to type 2 diabetes mellitus (HCC) Discussed how what you eat and drink can aide in kidney protection. Stay well hydrated. Avoid high salt foods. Avoid NSAIDS. Keep BP and BG well controlled.   Take medications as prescribed. Remain active and exercise as tolerated daily. Maintain weight.  Continue to monitor. 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  Sleep hygiene. Encouraged personality growth wand development through coping techniques and problem-solving skills. Limit/Decrease/Monitor drug/alcohol intake.    Hypothyroidism Controlled. Continue Levothyroxine. Reminded to take on an empty stomach 30-20mins before food.  Stop any Biotin Supplement 48-72 hours before next TSH level to reduce the risk of falsely low TSH levels. Continue to monitor.     Obstructive sleep apnea syndrome On CPAP with restorative sleep; neuro follows Weight loss encouraged Continue to monitor   Obesity, morbid (HCC) BMI 35 Discussed appropriate BMI Diet modification. Physical activity. Encouraged/praised to build confidence.   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 All questions and concerns regarding medications addressed.    Hair loss/other fatigue/iron deficiency Monitor TSH Check Zinc and Iron levels  B12 Deficiency Monitor levels   Chronic swimmers ear Start Lotrisone Keep area free of moisture Refer to Dermatology if s/s fail to improve.   Orders Placed This Encounter  Procedures   CBC with Differential/Platelet   COMPLETE METABOLIC PANEL WITH GFR   Lipid panel   Hemoglobin A1c   VITAMIN D 25 Hydroxy (Vit-D Deficiency, Fractures)   TSH    Notify office for further evaluation and treatment, questions or concerns if any reported s/s fail to improve.   The patient was advised to call back or seek an in-person evaluation if any symptoms worsen or if the condition fails to improve as anticipated.   Further  disposition pending results of labs. Discussed med's effects and SE's.    I discussed the assessment  and treatment plan with the patient. The patient was provided an opportunity to ask questions and all were answered. The patient agreed with the plan and demonstrated an understanding of the instructions.  Discussed med's effects and SE's. Screening labs and tests as requested with regular follow-up as recommended.  I provided 25 minutes of face-to-face time during this encounter including counseling, chart review, and critical decision making was preformed.  Today's Plan of Care is based on a patient-centered health care approach known as shared decision making - the decisions, tests and treatments allow for patient preferences and values to be balanced with clinical evidence.     Future Appointments  Date Time Provider Department Center  04/14/2023  3:00 PM Raynelle Dick, NP [facility] None     Subjective:  Kristina Webb is a 67 y.o. female who presents alongside her husband today for a general 3 month follow up. She has Cough; Hypertension; GERD (gastroesophageal reflux disease); Asthma; Major depression in partial remission (HCC); Hyperlipidemia associated with type 2 diabetes mellitus (HCC); Type 2 diabetes mellitus (HCC); CKD stage 3 due to type 2 diabetes mellitus (HCC); Sturge-Weber syndrome (HCC); Lower back pain; Obesity hypoventilation syndrome (HCC); Family history of heart disease; Insomnia due to other mental disorder; Severe obstructive sleep apnea-hypopnea syndrome; Exposure to second hand tobacco smoke; Cough variant asthma; RBBB; Iron deficiency; Hypothyroidism; and History of dysplastic nevus on their problem list.  She is married, no children, retired from Clinical biochemist.   Overall she reports feeling well today.    She does have a hx of chronic left swimmer's ear.  Has used topical creams in the past including triamcinolone without effectiveness.  Area is dry, red,  itchy and at times, feels clogged.    Has Hypothyroidism, controlled on Levothyroxine.   She is not on a multivitamin or zinc supplement. Does have a hx of IDA.   She has some depression following retirement and mother's passing, zoloft 100 mg does help, also takes amitriptyline 10 mg PRN.   She has cough variant asthma, on breztri (but admits takes intermittently PRN with allergies), singulaire, albuterol PRN.  On protonix 40 mg daily.   She follows with Dr. Jackelyn Knife for PAP,  and Dr. Yolanda Bonine for mammograms due to hx of papillary breast disease.   She had sleep study 08/2020 by Dr. Vickey Huger showing sever OSA, now on CPAP and reports doing well, reports 100% compliance.   BMI is Body mass index is 37.62 kg/m., she has been working on diet and exercise but was off Ozempic for several months which caused weight gain.  She has been on a stationary dose of 0.5 mg weekly for the last several months.  Wt Readings from Last 3 Encounters:  01/09/23 212 lb 6.4 oz (96.3 kg)  10/18/22 217 lb (98.4 kg)  10/09/22 216 lb (98 kg)    Her blood pressure has been controlled at home, today their BP is BP: 96/70  Did see cardiology Dr. Cristal Deer, had CT coronary calcium of 6 on 05/2020.    She does not workout. She denies chest pain, shortness of breath, dizziness.   She is not on cholesterol medication and denies myalgias. Her cholesterol is at goal. The cholesterol last visit was:   Lab Results  Component Value Date   CHOL 129 01/09/2023   HDL 55 01/09/2023   LDLCALC 55 01/09/2023   TRIG 107 01/09/2023   CHOLHDL 2.3 01/09/2023   She has not been working on diet and exercise for DM, prediabetes,  and denies polydipsia and polyuria.  Currently on Ozempic 0.5 mg/week. Last A1C in the office was:  Lab Results  Component Value Date   HGBA1C 6.1 (H) 01/09/2023   Last GFR: Lab Results  Component Value Date   EGFR 55 (L) 01/09/2023    She is on thyroid medication. Her medication was not  changed last visit.  She is taking synthroid daily.   Lab Results  Component Value Date   TSH 1.80 01/09/2023      Patient is on Vitamin D supplement.   Lab Results  Component Value Date   VD25OH 103 (H) 01/09/2023      Medication Review: Current Outpatient Medications on File Prior to Visit  Medication Sig Dispense Refill   acetaminophen (TYLENOL) 500 MG tablet Take 1,000 mg by mouth every 6 (six) hours as needed for mild pain, moderate pain or headache.     albuterol (VENTOLIN HFA) 108 (90 Base) MCG/ACT inhaler INHALE 2 PUFFS INTO THE LUNGS EVERY 6 HOURS AS NEEDED FOR WHEEZE 8.5 each 1   amitriptyline (ELAVIL) 10 MG tablet Take 1 tablet (10 mg total) by mouth at bedtime as needed. 90 tablet 3   aspirin EC 81 MG tablet Take 1 tablet (81 mg total) by mouth daily. Swallow whole. 90 tablet 3   benzonatate (TESSALON) 200 MG capsule Take 1 cap 3 times a day as needed for cough. 60 capsule 1   bisoprolol (ZEBETA) 10 MG tablet TAKE 1 TABLET BY MOUTH EVERY DAY FOR BLOOD PRESSURE 90 tablet 3   Budeson-Glycopyrrol-Formoterol (BREZTRI AEROSPHERE) 160-9-4.8 MCG/ACT AERO Inhale 2 puffs into the lungs in the morning and at bedtime. Rinse mouth/gargle after each use. 5.9 g 2   ferrous sulfate 325 (65 FE) MG tablet TAKE 1 TABLET BY MOUTH EVERY DAY 90 tablet 0   FREESTYLE LITE test strip USE TO CHECK BLOOD SUGAR ONCE DAILY. DX: E11.22, N18.2 100 strip 2   furosemide (LASIX) 40 MG tablet TAKE 1 TABLET DAILY FOR BLOOD PRESSURE & FLUID RETENTION / ANKLE SWELLING 90 tablet 3   Lancets MISC Use to check blood sugar once daily. Dx: E11.22, N18.2 100 each 2   levothyroxine (SYNTHROID) 75 MCG tablet Take 1 tablet Daily on an empty stomach with only water for 30 minutes & no Antacid meds, Calcium or Magnesium for 4 hours & avoid Biotin 90 tablet 3   methocarbamol (ROBAXIN) 500 MG tablet Take 1 tablet (500 mg total) by mouth every 8 (eight) hours as needed for muscle spasms. 60 tablet 0   montelukast  (SINGULAIR) 10 MG tablet TAKE 1 TABLET BY MOUTH DAILY FOR ALLERGIES 90 tablet 3   olmesartan (BENICAR) 40 MG tablet TAKE 1 TABLET DAILY FOR BLOOD PRESSURE & DIABETIC KIDNEY PROTECTION 90 tablet 1   pantoprazole (PROTONIX) 40 MG tablet TAKE 1 TABLET DAILY FOR HEARTBURN & INDIGESTION 30 tablet 11   rosuvastatin (CRESTOR) 10 MG tablet TAKE 1 TABLET BY MOUTH 3 TIMES A WEEK FOR CHOLESTEROL 36 tablet 4   Semaglutide, 1 MG/DOSE, 4 MG/3ML SOPN Inject 1 mg into the skin once a week. 3 mL 2   sertraline (ZOLOFT) 100 MG tablet TAKE 1 TABLET DAILY FOR DEPRESSION/ANXIETY 90 tablet 3   Vitamin D, Ergocalciferol, (DRISDOL) 1.25 MG (50000 UNIT) CAPS capsule TAKE 1 CAPSULE BY MOUTH 2 DAYS A WEEK 24 capsule 2   No current facility-administered medications on file prior to visit.    Allergies  Allergen Reactions   Levaquin [Levofloxacin In D5w] Other (See Comments)  Ankles hurt really bad ? ACHILLES TENDON ?   Doxycycline Other (See Comments)    UNSPECIFIED REACTION     Current Problems (verified) Patient Active Problem List   Diagnosis Date Noted   History of dysplastic nevus 09/26/2021   Iron deficiency 01/26/2021   Hypothyroidism    RBBB 01/25/2021   Cough variant asthma 01/18/2021   Insomnia due to other mental disorder 09/18/2020   Severe obstructive sleep apnea-hypopnea syndrome 09/18/2020   Exposure to second hand tobacco smoke 09/18/2020   Family history of heart disease 05/09/2020   Obesity hypoventilation syndrome (HCC) 08/05/2019   Lower back pain 07/17/2015   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   CKD stage 3 due to type 2 diabetes mellitus (HCC) 04/15/2014   Hyperlipidemia associated with type 2 diabetes mellitus (HCC)    Type 2 diabetes mellitus (HCC)    Asthma    Major depression in partial remission (HCC)    Hypertension    GERD (gastroesophageal reflux disease)    Cough 12/28/2012    Screening Tests Immunization History  Administered Date(s) Administered   Influenza  Inj Mdck Quad With Preservative 06/14/2020   Influenza Split 04/26/2013, 05/29/2015   Influenza Whole 05/26/2012   Influenza, High Dose Seasonal PF 07/09/2022   Influenza,inj,Quad PF,6+ Mos 06/11/2021   Influenza-Unspecified 05/20/2017, 05/19/2018, 05/21/2019   PFIZER(Purple Top)SARS-COV-2 Vaccination 10/22/2019, 11/26/2019, 05/30/2020   Pneumococcal Polysaccharide-23 09/10/2016   Td 08/27/2007, 10/22/2017     Patient Care Team: Lucky Cowboy, MD as PCP - General (Internal Medicine) Jodelle Red, MD as PCP - Cardiology (Cardiology) Nadara Mustard, MD as Consulting Physician (Orthopedic Surgery) Love, Genene Churn, MD as Consulting Physician (Neurology) Loletha Carrow, MD as Consulting Physician (Ophthalmology) Gastroenterology, Deboraha Sprang as Consulting Physician (Gastroenterology)  SURGICAL HISTORY She  has a past surgical history that includes Back surgery; Ankle ganglion cyst excision (Left); birth mark treatments; Breast lumpectomy with radioactive seed localization (Left, 02/02/2016); Breast surgery (Right); Colon surgery (2008); Cholecystectomy (N/A, 02/20/2017); and Cataract extraction (Right, 12/2016). FAMILY HISTORY Her family history includes Allergies in her mother; Asthma in her mother; Brain cancer in her father; Breast cancer in her mother; Colon cancer in her paternal grandmother; Emphysema in her mother; Heart disease in her brother, mother, and sister; Hypertension in her brother and brother; Prostate cancer in her father; Rheumatic fever in her brother; Thyroid disease in her brother; Transient ischemic attack in her sister. SOCIAL HISTORY She  reports that she has never smoked. She has never used smokeless tobacco. She reports that she does not drink alcohol and does not use drugs.  Review of Systems  Constitutional: Negative.   HENT: Negative.    Eyes: Negative.   Respiratory: Negative.    Cardiovascular: Negative.   Gastrointestinal: Negative.    Genitourinary: Negative.   Musculoskeletal: Negative.   Skin:  Negative for rash.  Neurological: Negative.   Psychiatric/Behavioral:  Positive for depression. The patient is nervous/anxious.      Objective:     Today's Vitals   01/09/23 1535  BP: 96/70  Pulse: 73  Temp: 97.6 F (36.4 C)  SpO2: 98%  Weight: 212 lb 6.4 oz (96.3 kg)  Height: 5\' 3"  (1.6 m)   Body mass index is 37.62 kg/m.  General appearance: alert, no distress, WD/WN, female thinning hair HEENT: normocephalic, sclerae anicteric, TMs pearly, nares patent, no discharge or erythema, pharynx normal Oral cavity: MMM, no lesions Neck: supple, no lymphadenopathy, no thyromegaly, no masses Heart: RRR, normal S1, S2, no murmurs Lungs: CTA bilaterally, no  wheezes, rhonchi, or rales Abdomen: +bs, soft, non tender, non distended, no masses, no hepatomegaly, no splenomegaly Musculoskeletal: nontender, no swelling, no obvious deformity Extremities: no edema, no cyanosis, no clubbing Pulses: 2+ symmetric, upper and lower extremities, normal cap refill Neurological: alert, oriented x 3, CN2-12 intact, strength normal upper extremities and lower extremities, sensation normal throughout, DTRs 2+ throughout, no cerebellar signs, gait normal Psychiatric: normal affect, behavior normal, pleasant   Kristina Camino, NP   01/13/2023

## 2023-01-10 LAB — COMPLETE METABOLIC PANEL WITH GFR
AG Ratio: 1.7 (calc) (ref 1.0–2.5)
ALT: 17 U/L (ref 6–29)
AST: 20 U/L (ref 10–35)
Albumin: 4.6 g/dL (ref 3.6–5.1)
Alkaline phosphatase (APISO): 81 U/L (ref 37–153)
BUN/Creatinine Ratio: 19 (calc) (ref 6–22)
BUN: 21 mg/dL (ref 7–25)
CO2: 25 mmol/L (ref 20–32)
Calcium: 9.7 mg/dL (ref 8.6–10.4)
Chloride: 103 mmol/L (ref 98–110)
Creat: 1.12 mg/dL — ABNORMAL HIGH (ref 0.50–1.05)
Globulin: 2.7 g/dL (calc) (ref 1.9–3.7)
Glucose, Bld: 94 mg/dL (ref 65–99)
Potassium: 4.5 mmol/L (ref 3.5–5.3)
Sodium: 138 mmol/L (ref 135–146)
Total Bilirubin: 0.4 mg/dL (ref 0.2–1.2)
Total Protein: 7.3 g/dL (ref 6.1–8.1)
eGFR: 55 mL/min/{1.73_m2} — ABNORMAL LOW (ref >=60)

## 2023-01-10 LAB — CBC WITH DIFFERENTIAL/PLATELET
Absolute Monocytes: 585 cells/uL (ref 200–950)
Basophils Absolute: 36 cells/uL (ref 0–200)
Basophils Relative: 0.4 %
Eosinophils Absolute: 324 cells/uL (ref 15–500)
Eosinophils Relative: 3.6 %
HCT: 41.2 % (ref 35.0–45.0)
Hemoglobin: 13.4 g/dL (ref 11.7–15.5)
Lymphs Abs: 2565 cells/uL (ref 850–3900)
MCH: 27.9 pg (ref 27.0–33.0)
MCHC: 32.5 g/dL (ref 32.0–36.0)
MCV: 85.7 fL (ref 80.0–100.0)
MPV: 9.7 fL (ref 7.5–12.5)
Monocytes Relative: 6.5 %
Neutro Abs: 5490 cells/uL (ref 1500–7800)
Neutrophils Relative %: 61 %
Platelets: 295 10*3/uL (ref 140–400)
RBC: 4.81 10*6/uL (ref 3.80–5.10)
RDW: 14.4 % (ref 11.0–15.0)
Total Lymphocyte: 28.5 %
WBC: 9 10*3/uL (ref 3.8–10.8)

## 2023-01-10 LAB — HEMOGLOBIN A1C
Hgb A1c MFr Bld: 6.1 % of total Hgb — ABNORMAL HIGH (ref <5.7)
Mean Plasma Glucose: 128 mg/dL
eAG (mmol/L): 7.1 mmol/L

## 2023-01-10 LAB — LIPID PANEL
Cholesterol: 129 mg/dL (ref <200)
HDL: 55 mg/dL (ref >=50)
LDL Cholesterol (Calc): 55 mg/dL (calc)
Non-HDL Cholesterol (Calc): 74 mg/dL (calc) (ref <130)
Total CHOL/HDL Ratio: 2.3 (calc) (ref <5.0)
Triglycerides: 107 mg/dL (ref <150)

## 2023-01-10 LAB — TSH: TSH: 1.8 mIU/L (ref 0.40–4.50)

## 2023-01-10 LAB — VITAMIN D 25 HYDROXY (VIT D DEFICIENCY, FRACTURES): Vit D, 25-Hydroxy: 103 ng/mL — ABNORMAL HIGH (ref 30–100)

## 2023-01-13 NOTE — Patient Instructions (Signed)
Otitis Externa  Otitis externa is an infection of the outer ear canal. The outer ear canal is the area between the outside of the ear and the eardrum. Otitis externa is sometimes called swimmer's ear. What are the causes? Common causes of this condition include: Swimming in dirty water. Moisture in the ear. An injury to the inside of the ear. An object stuck in the ear. A cut or scrape on the outside of the ear or in the ear canal. What increases the risk? You are more likely to get this condition if you go swimming often. What are the signs or symptoms? Itching in the ear. This is often the first symptom. Swelling of the ear. Redness in the ear. Ear pain. The pain may get worse when you pull on your ear. Pus coming from the ear. How is this treated? This condition may be treated with: Antibiotic ear drops. These are often given for 10-14 days. Medicines to reduce itching and swelling. Follow these instructions at home: If you were prescribed antibiotic ear drops, use them as told by your doctor. Do not stop using them even if you start to feel better. Take over-the-counter and prescription medicines only as told by your doctor. Avoid getting water in your ears as told by your doctor. You may be told to avoid swimming or water sports for a few days. Keep all follow-up visits. How is this prevented? Keep your ears dry. Use the corner of a towel to dry your ears after you swim or bathe. Try not to scratch or put things in your ear. Doing these things makes it easier for germs to grow in your ear. Avoid swimming in lakes, dirty water, or swimming pools that may not have the right amount of a chemical called chlorine. Contact a doctor if: You have a fever. Your ear is still red, swollen, or painful after 3 days. You still have pus coming from your ear after 3 days. Your redness, swelling, or pain gets worse. You have a very bad headache. Get help right away if: You have redness,  swelling, and pain or tenderness behind your ear. Summary Otitis externa is an infection of the outer ear canal. Symptoms include pain, redness, and swelling of the ear. If you were prescribed antibiotic ear drops, use them as told by your doctor. Do not stop using them even if you start to feel better. Try not to scratch or put things in your ear. This information is not intended to replace advice given to you by your health care provider. Make sure you discuss any questions you have with your health care provider. Document Revised: 10/25/2020 Document Reviewed: 10/25/2020 Elsevier Patient Education  2023 [employer]  

## 2023-01-23 ENCOUNTER — Other Ambulatory Visit: Payer: Self-pay | Admitting: Nurse Practitioner

## 2023-01-23 DIAGNOSIS — M5136 Other intervertebral disc degeneration, lumbar region: Secondary | ICD-10-CM | POA: Diagnosis not present

## 2023-01-23 DIAGNOSIS — M9903 Segmental and somatic dysfunction of lumbar region: Secondary | ICD-10-CM | POA: Diagnosis not present

## 2023-01-23 DIAGNOSIS — M9901 Segmental and somatic dysfunction of cervical region: Secondary | ICD-10-CM | POA: Diagnosis not present

## 2023-01-23 DIAGNOSIS — M7592 Shoulder lesion, unspecified, left shoulder: Secondary | ICD-10-CM | POA: Diagnosis not present

## 2023-01-23 DIAGNOSIS — E1169 Type 2 diabetes mellitus with other specified complication: Secondary | ICD-10-CM

## 2023-01-29 ENCOUNTER — Encounter: Payer: Self-pay | Admitting: Nurse Practitioner

## 2023-01-29 DIAGNOSIS — Z1211 Encounter for screening for malignant neoplasm of colon: Secondary | ICD-10-CM

## 2023-02-09 ENCOUNTER — Other Ambulatory Visit: Payer: Self-pay | Admitting: Internal Medicine

## 2023-02-11 ENCOUNTER — Encounter: Payer: Self-pay | Admitting: Internal Medicine

## 2023-02-17 ENCOUNTER — Other Ambulatory Visit: Payer: Self-pay

## 2023-02-17 DIAGNOSIS — R059 Cough, unspecified: Secondary | ICD-10-CM

## 2023-02-17 DIAGNOSIS — J4541 Moderate persistent asthma with (acute) exacerbation: Secondary | ICD-10-CM

## 2023-02-17 MED ORDER — MONTELUKAST SODIUM 10 MG PO TABS
ORAL_TABLET | ORAL | 3 refills | Status: DC
Start: 2023-02-17 — End: 2024-05-10

## 2023-02-24 ENCOUNTER — Other Ambulatory Visit: Payer: Self-pay

## 2023-02-24 DIAGNOSIS — E1169 Type 2 diabetes mellitus with other specified complication: Secondary | ICD-10-CM

## 2023-02-24 NOTE — Progress Notes (Signed)
Patient assistance paperwork for Ozempic faxed to [employer]

## 2023-03-03 ENCOUNTER — Encounter: Payer: PPO | Admitting: Nurse Practitioner

## 2023-03-10 ENCOUNTER — Ambulatory Visit: Payer: PPO | Admitting: Nurse Practitioner

## 2023-03-12 ENCOUNTER — Telehealth: Payer: Self-pay | Admitting: Gastroenterology

## 2023-03-12 NOTE — Telephone Encounter (Signed)
Hi Dr. Meridee Score,  Supervising Provider: 03/12/23-AM   We received a referral for patient to have a colonoscopy. She does have GI history with Dr. Kinnie Scales who has retired. The patient would like to continue her care within the [facility] if possible. Her records are available through Epic in Care Everywhere. Please advise in scheduling.    Thank you

## 2023-03-12 NOTE — Telephone Encounter (Signed)
I have reviewed the patient's chart. In 2019, the patient had a colonoscopy for previous history of adenomas.  No evidence of any adenomas were found. She was recommended a 5-year follow-up due to the previous recommendations. She also had an upper endoscopy which was unremarkable.  As the previous recommendation was for a 5-year follow-up, we will pursue that at this time.  If she has no further polyps in her colon, her surveillance guidelines will change and be pushed out further but I would go ahead and just plan to proceed with the previously recommended 5-year follow-up.  She can be scheduled for a direct colonoscopy with me and with a previsit as per protocol.  If she feels she wants to be seen in clinic first to discuss symptoms, she can be scheduled with me or any of the APP's (I can supervise).  Let me know what she decides. Thanks. GM

## 2023-03-13 NOTE — Telephone Encounter (Signed)
Called patient to advise, she said she would like to do a cologuard instead. She will contact her PCP or give [place] a call back.

## 2023-03-27 ENCOUNTER — Telehealth: Payer: Self-pay

## 2023-03-27 NOTE — Telephone Encounter (Signed)
error 

## 2023-04-02 DIAGNOSIS — H2512 Age-related nuclear cataract, left eye: Secondary | ICD-10-CM | POA: Diagnosis not present

## 2023-04-02 DIAGNOSIS — Z961 Presence of intraocular lens: Secondary | ICD-10-CM | POA: Diagnosis not present

## 2023-04-02 DIAGNOSIS — H524 Presbyopia: Secondary | ICD-10-CM | POA: Diagnosis not present

## 2023-04-02 DIAGNOSIS — E119 Type 2 diabetes mellitus without complications: Secondary | ICD-10-CM | POA: Diagnosis not present

## 2023-04-02 DIAGNOSIS — H43812 Vitreous degeneration, left eye: Secondary | ICD-10-CM | POA: Diagnosis not present

## 2023-04-02 LAB — HM DIABETES EYE EXAM

## 2023-04-03 ENCOUNTER — Encounter: Payer: Self-pay | Admitting: Internal Medicine

## 2023-04-05 ENCOUNTER — Other Ambulatory Visit: Payer: Self-pay | Admitting: Nurse Practitioner

## 2023-04-05 DIAGNOSIS — E611 Iron deficiency: Secondary | ICD-10-CM

## 2023-04-11 NOTE — Progress Notes (Unsigned)
Complete Physical  Assessment and Plan:  Arline was seen today for annual exam.  Diagnoses and all orders for this visit:  Encounter for Annual Physical Exam with abnormal findings Due annually  Health Maintenance reviewed Healthy lifestyle reviewed and goals set - PAP/mammogram reports requested - Pending receipt of diabetes eye exam - review at next OV - Shingrix at pharmacy   Essential hypertension Aggressively controlled BPs, fatigue; try holding amlodipine 2.5 mg daily, ok to stay off if home BPs mostly 120s/70s Monitor blood pressure at home; call if consistently over 130/80 Continue DASH diet.   Reminder to go to the ER if any CP, SOB, nausea, dizziness, severe HA, changes vision/speech, left arm numbness and tingling and jaw pain. -     CBC with Differential/Platelet -     COMPLETE METABOLIC PANEL WITH GFR -     Magnesium -     EKG 12-Lead -     Microalbumin / creatinine urine ratio  Mixed hyperlipidemia associated with DMII Continue medications LDL goal <70  Discussed dietary and exercise modifications Low fat diet -     Lipid panel -     TSH  Gastroesophageal reflux disease with esophagitis Well managed on current medications Discussed diet, avoiding triggers and other lifestyle changes -     Magnesium  Type 2 diabetes mellitus with stage 2 chronic kidney disease, without long-term current use of insulin [facility]) Education: Reviewed 'ABCs' of diabetes management (respective goals in parentheses):  A1C (<7), blood pressure (<130/80), and cholesterol (LDL <70) Eye Exam yearly and Dental Exam every 6 months. 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 No antacid medications for 4 hours.       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  Encounter for screening for cardiovascular disorders -     EKG 12-Lead  Medication management Continued  No orders of the defined types were placed in this encounter.    Discussed med's effects and SE's. Screening labs and tests as requested with regular follow-up as recommended. Over 40 minutes of face to  face exam, counseling, chart review, and complex, high level critical decision making was performed this visit.   Future Appointments  Date Time Provider Department Center  04/14/2023  3:00 PM Raynelle Dick, NP [facility] None  05/12/2023  3:30 PM Raynelle Dick, NP [facility] None  04/13/2024  3:00 PM Raynelle Dick, NP [facility] None     HPI  67 y.o. female  presents for a complete physical and follow up for has Cough; Hypertension; GERD (gastroesophageal reflux disease); Asthma; Major depression in partial remission (HCC); Hyperlipidemia associated with type 2 diabetes mellitus (HCC); Type 2 diabetes mellitus (HCC); CKD stage 3 due to type 2 diabetes mellitus (HCC); Sturge-Weber syndrome (HCC); Lower back pain; Obesity hypoventilation syndrome (HCC); Family history of heart disease; Insomnia due to other mental disorder; Severe obstructive sleep apnea-hypopnea syndrome; Exposure to second hand tobacco smoke; Cough variant asthma; RBBB; Iron deficiency; Hypothyroidism; and History of dysplastic nevus on their problem list.   She is married, no children, retired from Clinical biochemist.  She follows with Dr. Jackelyn Knife for PAP, follows with Dr. Yolanda Bonine for mammograms due to hx of papillary breast disease.   She has some depression following retirement, zoloft 100 mg does help, also takes amitriptyline 10 mg PRN.  Doing more crocheting.   She has cough variant asthma, on breztri (but admits takes intermittently PRN with allergies), singulaire, albuterol PRN.  On protonix 40 mg daily.   She had sleep study 08/2020 by Dr. Vickey Huger showing sever OSA, now on CPAP and reports doing well, reports 100% compliance.   BMI is There is no height or weight on file to calculate BMI., she has been working on diet, admits less walking since not at work, has park could go to.  She is newly on ozempic 0.25 mg/week and tolerating, insurance issue and has been off, regained 16 lb.  Wt Readings from Last  3 Encounters:  01/09/23 212 lb 6.4 oz (96.3 kg)  10/18/22 217 lb (98.4 kg)  10/09/22 216 lb (98 kg)   Her blood pressure has been controlled at home, today their BP is  ,  BP Readings from Last 3 Encounters:  01/09/23 96/70  10/18/22 97/65  10/09/22 120/68   She does not workout. She denies chest pain, dizziness.  Endorses fatigue.  Currently taking bisoprolol 10 mg, olmesartan 40 mg, lasix 40 mg, amlodipine 2.5 mg daily.   Did see cardiology Dr. Cristal Deer, had CT coronary calcium of 6 on 05/2020.   She is on cholesterol medication (rosuvastatin 10 mg three days a week) and denies myalgias. Her cholesterol is at goal of LDL <70. The cholesterol last visit was:   Lab Results  Component Value Date   CHOL 129 01/09/2023   HDL 55 01/09/2023   LDLCALC 55 01/09/2023   TRIG 107 01/09/2023   CHOLHDL 2.3 01/09/2023   She has been working on diet and exercise for DMII, she is on bASA, she is on ACE/ARB and denies hyperglycemia, hypoglycemia , increased appetite, nausea, polydipsia, polyuria, visual disturbances, vomiting and weight loss.  Metformin 500 mg tabs  once daily - hasn't been taking, had some concerns, reviewed and alleviated, agrees to restart Did well on ozempic 0.25 mg/week. Cost barrier/high deductible, will get medicare in Oct Last A1C in the office was:  Lab Results  Component Value Date   HGBA1C 6.1 (H) 01/09/2023   Baseline CKD III (down from stage 2 in 2021), admits to poor water intake, does diet soda.  On olmesartan 40 mg daily  Last GFR: Lab Results  Component Value Date   EGFR 55 (L) 01/09/2023   EGFR 48 (L) 10/09/2022   EGFR 38 (L) 07/09/2022   Lab Results  Component Value Date   MICRALBCREAT NOTE 02/28/2022   She is on thyroid medication. Her medication was not changed last visit.  She is taking synthroid daily. Lab Results  Component Value Date   TSH 1.80 01/09/2023  .  Patient is on Vitamin D supplement.   Lab Results  Component Value Date    VD25OH 103 (H) 01/09/2023     She reports has been taking iron supplement, ? 35 mg daily in the last month  Lab Results  Component Value Date   IRON 53 10/09/2022   TIBC 469 (H) 10/09/2022   FERRITIN 8 (L) 10/09/2022     Current Medications:  Current Outpatient Medications on File Prior to Visit  Medication Sig Dispense Refill   acetaminophen (TYLENOL) 500 MG tablet Take 1,000 mg by mouth every 6 (six) hours as needed for mild pain, moderate pain or headache.     albuterol (VENTOLIN HFA) 108 (90 Base) MCG/ACT inhaler INHALE 2 PUFFS INTO THE LUNGS EVERY 6 HOURS AS NEEDED FOR WHEEZE 8.5 each 1   amitriptyline (ELAVIL) 10 MG tablet Take 1 tablet (10 mg total) by mouth at bedtime as needed. 90 tablet 3   aspirin EC 81 MG tablet Take 1 tablet (81 mg total) by mouth daily. Swallow whole. 90 tablet 3   benzonatate (TESSALON) 200 MG capsule Take 1 cap 3 times a day as needed for cough. 60 capsule 1   bisoprolol (ZEBETA) 10 MG tablet TAKE 1 TABLET BY MOUTH EVERY DAY FOR BLOOD PRESSURE 90 tablet 3   Budeson-Glycopyrrol-Formoterol (BREZTRI AEROSPHERE) 160-9-4.8 MCG/ACT AERO Inhale 2 puffs into the lungs in the morning and at bedtime. Rinse mouth/gargle after each use. 5.9 g 2   clotrimazole-betamethasone (LOTRISONE) cream Apply 1 Application topically 2 (two) times daily. 15 g 2   ferrous sulfate 325 (65 FE) MG tablet TAKE 1 TABLET BY MOUTH EVERY DAY 90 tablet 0   FREESTYLE LITE test strip USE TO CHECK BLOOD SUGAR ONCE DAILY. DX: E11.22, N18.2 100 strip 2   furosemide (LASIX) 40 MG tablet TAKE 1 TABLET DAILY FOR BLOOD PRESSURE & FLUID RETENTION / ANKLE SWELLING 90 tablet 3   Lancets MISC Use to check blood sugar once daily. Dx: E11.22, N18.2 100 each 2   levothyroxine (SYNTHROID) 75 MCG tablet Take  1 tablet  Daily  on an empty stomach with only water for 30 minutes & no Antacid meds, Calcium or Magnesium for 4 hours & avoid Biotin                              /  TAKE                                         BY                                                 MOUTH 90 tablet 3   methocarbamol (ROBAXIN) 500 MG tablet Take 1 tablet (500 mg total) by mouth every 8 (eight) hours as needed for muscle spasms. 60 tablet 0   montelukast (SINGULAIR) 10 MG tablet TAKE 1 TABLET BY MOUTH DAILY FOR ALLERGIES 90 tablet 3   olmesartan (BENICAR) 40 MG tablet TAKE 1 TABLET DAILY FOR BLOOD PRESSURE & DIABETIC KIDNEY PROTECTION 90 tablet 1   pantoprazole (PROTONIX) 40 MG tablet TAKE 1 TABLET DAILY FOR HEARTBURN & INDIGESTION 30 tablet 11   rosuvastatin (CRESTOR) 10 MG tablet TAKE 1 TABLET BY MOUTH 3 TIMES A WEEK FOR CHOLESTEROL 36 tablet 4   Semaglutide, 1 MG/DOSE, (OZEMPIC, 1 MG/DOSE,) 4 MG/3ML SOPN INJECT 1MG  INTO THE SKIN ONCE A WEEK 3 mL 2   sertraline (ZOLOFT) 100 MG tablet TAKE 1 TABLET DAILY FOR DEPRESSION/ANXIETY 90 tablet 3   Vitamin D, Ergocalciferol, (DRISDOL) 1.25 MG (50000 UNIT) CAPS capsule TAKE 1 CAPSULE BY MOUTH 2 DAYS A WEEK 24 capsule 2   No current facility-administered medications on file prior to visit.   Allergies:  Allergies  Allergen Reactions   Levaquin [Levofloxacin In D5w] Other (See Comments)    Ankles hurt really bad ? ACHILLES TENDON ?   Doxycycline Other (See Comments)    UNSPECIFIED REACTION    Medical History:  She has Cough; Hypertension; GERD (gastroesophageal reflux disease); Asthma; Major depression in partial remission (HCC); Hyperlipidemia associated with type 2 diabetes mellitus (HCC); Type 2 diabetes mellitus (HCC); CKD stage 3 due to type 2 diabetes mellitus (HCC); Sturge-Weber syndrome (HCC); Lower back pain; Obesity hypoventilation syndrome (HCC); Family history of heart disease; Insomnia due to other mental disorder; Severe obstructive sleep apnea-hypopnea syndrome; Exposure to second hand tobacco smoke; Cough variant asthma; RBBB; Iron deficiency; Hypothyroidism; and History of dysplastic nevus on  their problem list.  Health Maintenance:   Immunization History  Administered Date(s) Administered   Influenza Inj Mdck Quad With Preservative 06/14/2020   Influenza Split 04/26/2013, 05/29/2015   Influenza Whole 05/26/2012   Influenza, High Dose Seasonal PF 07/09/2022   Influenza,inj,Quad PF,6+ Mos 06/11/2021   Influenza-Unspecified 05/20/2017, 05/19/2018, 05/21/2019   PFIZER(Purple Top)SARS-COV-2 Vaccination 10/22/2019, 11/26/2019, 05/30/2020   Pneumococcal Polysaccharide-23 09/10/2016   Td 08/27/2007, 10/22/2017   Health Maintenance  Topic Date Due   Zoster Vaccines- Shingrix (1 of 2) Never done   COVID-19 Vaccine (4 - 2023-24 season) 04/26/2022   Pneumonia Vaccine 23+ Years old (2 of 2 - PCV) 06/15/2022   DEXA SCAN  Never done   MAMMOGRAM  07/27/2022   Colonoscopy  11/11/2022   Diabetic kidney evaluation - Urine ACR  03/01/2023   INFLUENZA VACCINE  03/27/2023   FOOT EXAM  04/19/2023   Medicare Annual Wellness (AWV)  07/10/2023   HEMOGLOBIN A1C  07/12/2023   Diabetic kidney evaluation - eGFR measurement  01/09/2024   PAP SMEAR-Modifier  03/02/2024   OPHTHALMOLOGY EXAM  04/01/2024   DTaP/Tdap/Td (3 - Tdap) 10/23/2027   Hepatitis C Screening  Completed   HIV Screening  Completed   HPV VACCINES  Aged Out    Shingrix: declines for now, check with insurance after discussion   Pap: pelvic in 02/2021, Due Dr Newt Minion, needs to schedule, report requested MGM: 07/2020, R diagnostic 11/2020 due to bloody discharge, had MRI, unclear, Dr. Jenne Campus following, mammogram reports requested DEXA: Due at age 49  Colonoscopy: 10/2017, 5 year recall   Last Dental Exam: last 2023, goes q60m  Last Eye Exam: Dr. Cathey Endow, last 2 weeks ago 01/2022, no retinopathy per patient, requested report be forwarded Last derm exam: Dr. Dorita Sciara office, last 2022  Patient Care Team: Lucky Cowboy, MD as PCP - General (Internal Medicine) Jodelle Red, MD as PCP - Cardiology (Cardiology) Nadara Mustard, MD as Consulting Physician (Orthopedic Surgery) Love, Genene Churn, MD as Consulting Physician (Neurology) Loletha Carrow, MD as Consulting Physician (Ophthalmology) Gastroenterology, Deboraha Sprang as Consulting Physician (Gastroenterology)  Surgical History:  She has a past surgical history that includes Back surgery; Ankle ganglion cyst excision (Left); birth mark treatments; Breast lumpectomy with radioactive seed localization (Left, 02/02/2016); Breast surgery (Right); Colon surgery (2008); Cholecystectomy (N/A, 02/20/2017); and Cataract extraction (Right, 12/2016). Family History:  Herfamily history includes Allergies in her mother; Asthma in her mother; Brain cancer in her father; Breast cancer in her mother; Colon cancer in her paternal grandmother; Emphysema in her mother; Heart disease in her brother, mother, and sister; Hypertension in her brother and brother; Prostate cancer in her father; Rheumatic fever in her brother; Thyroid disease in her brother; Transient ischemic attack in her sister. Social History:  She reports that she has never smoked. She has never used smokeless tobacco. She reports that she does not drink alcohol and does not use drugs.   Review of Systems: Review of Systems  Constitutional:  Negative for malaise/fatigue and weight loss.  HENT:  Negative for hearing loss and tinnitus.   Eyes:  Negative for blurred vision and double vision.  Respiratory:  Negative for cough, sputum production, shortness of breath and wheezing.   Cardiovascular:  Negative for chest pain, palpitations, orthopnea, claudication, leg swelling and PND.  Gastrointestinal:  Negative for abdominal pain, blood in stool, constipation, diarrhea, heartburn, melena, nausea and vomiting.  Genitourinary: Negative.   Musculoskeletal:  Negative for falls, joint pain and myalgias.  Skin:  Negative for rash.  Neurological:  Negative for dizziness, tingling, sensory change, weakness and headaches.   Endo/Heme/Allergies:  Negative for polydipsia.  Psychiatric/Behavioral:  Negative for depression (improved), memory loss, substance abuse and suicidal ideas. The patient is not nervous/anxious and does not have insomnia.   All other systems reviewed and are negative.   Physical Exam: Estimated body mass index is 37.62 kg/m as calculated from the following:   Height as of 01/09/23: 5\' 3"  (1.6 m).   Weight as of 01/09/23: 212 lb 6.4 oz (96.3 kg). There were no vitals taken for this visit. General Appearance: Well nourished, in no apparent distress.  Eyes: PERRLA, EOMs, conjunctiva no swelling or erythema, normal fundi and vessels.  Sinuses: No Frontal/maxillary tenderness  ENT/Mouth: Ext aud canals clear, normal light reflex with TMs without erythema, bulging. Good dentition. No erythema, swelling, or exudate on post pharynx. Tonsils not swollen or erythematous. Hearing normal.  Neck: Supple, thyroid normal. No bruits  Respiratory: Respiratory effort normal, BS equal bilaterally without rales, rhonchi, wheezing or stridor.  Cardio: RRR without murmurs, rubs or gallops. Brisk peripheral pulses without edema.  Chest: symmetric, with normal excursions and percussion.  Abdomen:  Soft obese, nontender, no guarding, rebound, hernias, masses, or organomegaly.  Lymphatics: Non tender without lymphadenopathy.  Musculoskeletal: Full ROM all peripheral extremities,5/5 strength, and normal gait.  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  Warm, dry without rashes, ecchymosis. She has several excoriated areas to abdomen; several nevi, benign appearing.  Neuro: Cranial nerves intact, reflexes equal bilaterally. Normal muscle tone, no cerebellar symptoms. Sensation intact to monofilament. Psych: Awake and oriented X 3, normal affect, Insight and Judgment appropriate.   EKG: Sinus brady, IRBBB  Mccormick Macon Hollie Salk, NP-C 9:43 AM [facility]

## 2023-04-14 ENCOUNTER — Ambulatory Visit (INDEPENDENT_AMBULATORY_CARE_PROVIDER_SITE_OTHER): Payer: PPO | Admitting: Nurse Practitioner

## 2023-04-14 ENCOUNTER — Encounter: Payer: Self-pay | Admitting: Nurse Practitioner

## 2023-04-14 VITALS — BP 114/74 | HR 70 | Temp 97.3°F | Ht 63.0 in | Wt 223.0 lb

## 2023-04-14 DIAGNOSIS — Z79899 Other long term (current) drug therapy: Secondary | ICD-10-CM | POA: Diagnosis not present

## 2023-04-14 DIAGNOSIS — Z136 Encounter for screening for cardiovascular disorders: Secondary | ICD-10-CM | POA: Diagnosis not present

## 2023-04-14 DIAGNOSIS — D649 Anemia, unspecified: Secondary | ICD-10-CM | POA: Diagnosis not present

## 2023-04-14 DIAGNOSIS — E538 Deficiency of other specified B group vitamins: Secondary | ICD-10-CM | POA: Diagnosis not present

## 2023-04-14 DIAGNOSIS — E559 Vitamin D deficiency, unspecified: Secondary | ICD-10-CM | POA: Diagnosis not present

## 2023-04-14 DIAGNOSIS — G4733 Obstructive sleep apnea (adult) (pediatric): Secondary | ICD-10-CM

## 2023-04-14 DIAGNOSIS — E1122 Type 2 diabetes mellitus with diabetic chronic kidney disease: Secondary | ICD-10-CM

## 2023-04-14 DIAGNOSIS — E039 Hypothyroidism, unspecified: Secondary | ICD-10-CM

## 2023-04-14 DIAGNOSIS — N1831 Chronic kidney disease, stage 3a: Secondary | ICD-10-CM | POA: Diagnosis not present

## 2023-04-14 DIAGNOSIS — F5105 Insomnia due to other mental disorder: Secondary | ICD-10-CM

## 2023-04-14 DIAGNOSIS — K2100 Gastro-esophageal reflux disease with esophagitis, without bleeding: Secondary | ICD-10-CM

## 2023-04-14 DIAGNOSIS — E785 Hyperlipidemia, unspecified: Secondary | ICD-10-CM | POA: Diagnosis not present

## 2023-04-14 DIAGNOSIS — J069 Acute upper respiratory infection, unspecified: Secondary | ICD-10-CM

## 2023-04-14 DIAGNOSIS — J4541 Moderate persistent asthma with (acute) exacerbation: Secondary | ICD-10-CM

## 2023-04-14 DIAGNOSIS — E1169 Type 2 diabetes mellitus with other specified complication: Secondary | ICD-10-CM

## 2023-04-14 DIAGNOSIS — I700 Atherosclerosis of aorta: Secondary | ICD-10-CM

## 2023-04-14 DIAGNOSIS — Z1389 Encounter for screening for other disorder: Secondary | ICD-10-CM

## 2023-04-14 DIAGNOSIS — I10 Essential (primary) hypertension: Secondary | ICD-10-CM

## 2023-04-14 DIAGNOSIS — Z0001 Encounter for general adult medical examination with abnormal findings: Secondary | ICD-10-CM

## 2023-04-14 DIAGNOSIS — Q8589 Other phakomatoses, not elsewhere classified: Secondary | ICD-10-CM

## 2023-04-14 DIAGNOSIS — N183 Chronic kidney disease, stage 3 unspecified: Secondary | ICD-10-CM | POA: Diagnosis not present

## 2023-04-14 DIAGNOSIS — J45991 Cough variant asthma: Secondary | ICD-10-CM

## 2023-04-14 DIAGNOSIS — Z0000 Encounter for general adult medical examination without abnormal findings: Secondary | ICD-10-CM | POA: Diagnosis not present

## 2023-04-14 DIAGNOSIS — F324 Major depressive disorder, single episode, in partial remission: Secondary | ICD-10-CM

## 2023-04-14 MED ORDER — ALBUTEROL SULFATE HFA 108 (90 BASE) MCG/ACT IN AERS
2.0000 | INHALATION_SPRAY | RESPIRATORY_TRACT | 1 refills | Status: AC | PRN
Start: 2023-04-14 — End: ?

## 2023-04-14 MED ORDER — IPRATROPIUM BROMIDE 0.03 % NA SOLN
2.0000 | Freq: Three times a day (TID) | NASAL | 2 refills | Status: DC
Start: 2023-04-14 — End: 2023-07-09

## 2023-04-14 MED ORDER — AZITHROMYCIN 250 MG PO TABS: ORAL_TABLET | ORAL | 1 refills | Status: DC

## 2023-04-14 NOTE — Patient Instructions (Signed)

## 2023-04-15 LAB — CBC WITH DIFFERENTIAL/PLATELET
Absolute Monocytes: 487 {cells}/uL (ref 200–950)
Basophils Absolute: 52 {cells}/uL (ref 0–200)
Basophils Relative: 0.6 %
Eosinophils Absolute: 426 {cells}/uL (ref 15–500)
Eosinophils Relative: 4.9 %
HCT: 40.5 % (ref 35.0–45.0)
Hemoglobin: 13.1 g/dL (ref 11.7–15.5)
Lymphs Abs: 2375 {cells}/uL (ref 850–3900)
MCH: 27.9 pg (ref 27.0–33.0)
MCHC: 32.3 g/dL (ref 32.0–36.0)
MCV: 86.2 fL (ref 80.0–100.0)
MPV: 10.4 fL (ref 7.5–12.5)
Monocytes Relative: 5.6 %
Neutro Abs: 5359 {cells}/uL (ref 1500–7800)
Neutrophils Relative %: 61.6 %
Platelets: 294 10*3/uL (ref 140–400)
RBC: 4.7 10*6/uL (ref 3.80–5.10)
RDW: 14.2 % (ref 11.0–15.0)
Total Lymphocyte: 27.3 %
WBC: 8.7 10*3/uL (ref 3.8–10.8)

## 2023-04-15 LAB — LIPID PANEL
Cholesterol: 156 mg/dL (ref <200)
HDL: 60 mg/dL (ref >=50)
LDL Cholesterol (Calc): 74 mg/dL
Non-HDL Cholesterol (Calc): 96 mg/dL (calc) (ref <130)
Total CHOL/HDL Ratio: 2.6 (calc) (ref <5.0)
Triglycerides: 136 mg/dL (ref <150)

## 2023-04-15 LAB — URINALYSIS, ROUTINE W REFLEX MICROSCOPIC
Bilirubin Urine: NEGATIVE
Glucose, UA: NEGATIVE
Hgb urine dipstick: NEGATIVE
Ketones, ur: NEGATIVE
Leukocytes,Ua: NEGATIVE
Nitrite: NEGATIVE
Protein, ur: NEGATIVE
Specific Gravity, Urine: 1.008 (ref 1.001–1.035)
pH: 7 (ref 5.0–8.0)

## 2023-04-15 LAB — MAGNESIUM: Magnesium: 2 mg/dL (ref 1.5–2.5)

## 2023-04-15 LAB — COMPLETE METABOLIC PANEL WITH GFR
AG Ratio: 1.5 (calc) (ref 1.0–2.5)
ALT: 22 U/L (ref 6–29)
AST: 23 U/L (ref 10–35)
Albumin: 4.4 g/dL (ref 3.6–5.1)
Alkaline phosphatase (APISO): 71 U/L (ref 37–153)
BUN: 18 mg/dL (ref 7–25)
CO2: 29 mmol/L (ref 20–32)
Calcium: 9.7 mg/dL (ref 8.6–10.4)
Chloride: 101 mmol/L (ref 98–110)
Creat: 1.05 mg/dL (ref 0.50–1.05)
Globulin: 3 g/dL (ref 1.9–3.7)
Glucose, Bld: 132 mg/dL — ABNORMAL HIGH (ref 65–99)
Potassium: 4.4 mmol/L (ref 3.5–5.3)
Sodium: 140 mmol/L (ref 135–146)
Total Bilirubin: 0.4 mg/dL (ref 0.2–1.2)
Total Protein: 7.4 g/dL (ref 6.1–8.1)
eGFR: 59 mL/min/{1.73_m2} — ABNORMAL LOW (ref >=60)

## 2023-04-15 LAB — TSH: TSH: 4.61 m[IU]/L — ABNORMAL HIGH (ref 0.40–4.50)

## 2023-04-15 LAB — HEMOGLOBIN A1C W/OUT EAG: Hgb A1c MFr Bld: 6.9 %{Hb} — ABNORMAL HIGH (ref <5.7)

## 2023-04-15 LAB — VITAMIN D 25 HYDROXY (VIT D DEFICIENCY, FRACTURES): Vit D, 25-Hydroxy: 93 ng/mL (ref 30–100)

## 2023-04-15 LAB — IRON,TIBC AND FERRITIN PANEL
%SAT: 13 % — ABNORMAL LOW (ref 16–45)
Ferritin: 12 ng/mL — ABNORMAL LOW (ref 16–288)
Iron: 58 ug/dL (ref 45–160)
TIBC: 454 ug/dL — ABNORMAL HIGH (ref 250–450)

## 2023-04-15 LAB — MICROALBUMIN / CREATININE URINE RATIO
Creatinine, Urine: 30 mg/dL (ref 20–275)
Microalb, Ur: <0.2 mg/dL

## 2023-04-15 LAB — VITAMIN B12: Vitamin B-12: 361 pg/mL (ref 200–1100)

## 2023-05-03 ENCOUNTER — Other Ambulatory Visit: Payer: Self-pay | Admitting: Nurse Practitioner

## 2023-05-12 ENCOUNTER — Ambulatory Visit: Payer: PPO | Admitting: Nurse Practitioner

## 2023-05-29 ENCOUNTER — Ambulatory Visit: Payer: PPO | Admitting: Nurse Practitioner

## 2023-05-29 VITALS — BP 118/80 | HR 67 | Temp 97.5°F | Ht 63.0 in | Wt 216.2 lb

## 2023-05-29 DIAGNOSIS — N1831 Chronic kidney disease, stage 3a: Secondary | ICD-10-CM

## 2023-05-29 DIAGNOSIS — Z23 Encounter for immunization: Secondary | ICD-10-CM

## 2023-05-29 DIAGNOSIS — Z79899 Other long term (current) drug therapy: Secondary | ICD-10-CM | POA: Diagnosis not present

## 2023-05-29 DIAGNOSIS — E1122 Type 2 diabetes mellitus with diabetic chronic kidney disease: Secondary | ICD-10-CM | POA: Diagnosis not present

## 2023-05-29 MED ORDER — OZEMPIC (2 MG/DOSE) 8 MG/3ML ~~LOC~~ SOPN
2.0000 mg | PEN_INJECTOR | SUBCUTANEOUS | 2 refills | Status: DC
Start: 2023-05-29 — End: 2023-11-07

## 2023-05-29 NOTE — Progress Notes (Signed)
Assessment and Plan:  Azarie Coriz was seen today for an episodic visit.  Diagnoses and all order for this visit:  Type 2 diabetes mellitus with stage 3a chronic kidney disease, without long-term current use of insulin (HCC) Increase Ozempic to 2 mg weekly Education: Reviewed 'ABCs' of diabetes management  Discussed goals to be met and/or maintained include A1C (<7) Blood pressure (<130/80) Cholesterol (LDL <70) Continue Eye Exam yearly  Continue Dental Exam Q6 mo Discussed dietary recommendations Discussed Physical Activity recommendations Foot exam UTD  - Semaglutide, 2 MG/DOSE, (OZEMPIC, 2 MG/DOSE,) 8 MG/3ML SOPN; Inject 2 mg into the skin once a week.  Dispense: 3 mL; Refill: 2  Obesity, morbid (HCC) Weight trending down. Discussed appropriate BMI Diet modification. Physical activity. Encouraged/praised to build confidence.  Medication management All medications discussed and reviewed in full. All questions and concerns regarding medications addressed.     Notify office for further evaluation and treatment, questions or concerns if s/s fail to improve. The risks and benefits of my recommendations, as well as other treatment options were discussed with the patient today. Questions were answered.  Further disposition pending results of labs. Discussed med's effects and SE's.    Over 20 minutes of exam, counseling, chart review, and critical decision making was performed.   Future Appointments  Date Time Provider Department Center  07/28/2023  3:30 PM Raynelle Dick, NP [facility] None  04/13/2024  3:00 PM Raynelle Dick, NP [facility] None   ------------------------------------------------------------------------------------------------------------------   HPI BP 118/80   Pulse 67   Temp (!) 97.5 F (36.4 C)   Ht 5\' 3"  (1.6 m)   Wt 216 lb 3.2 oz (98.1 kg)   SpO2 98%   BMI 38.30 kg/m   67 y.o.female presents for follow up on weight loss, DM and  medication management.  BMI is Body mass index is 39.5 kg/m., she has been working on diet, admits less walking since not at work, has park could go to.  Currently back on Ozempic x 1 weekly able to obtain through assistance program.  Wt Readings from Last 3 Encounters:  05/29/23 216 lb 3.2 oz (98.1 kg)  04/14/23 223 lb (101.2 kg)  01/09/23 212 lb 6.4 oz (96.3 kg)   She has been working on diet and exercise for DMII, she is on bASA, she is on ACE/ARB and denies hyperglycemia, hypoglycemia , increased appetite, nausea, polydipsia, polyuria, visual disturbances, vomiting and weight loss.  Lab Results  Component Value Date   HGBA1C 6.9 (H) 04/14/2023    Past Medical History:  Diagnosis Date   Anemia    hx   Anxiety    Arthritis    back   Asthma    Depression    Deviated septum    GERD (gastroesophageal reflux disease)    Headache    History of hiatal hernia    Hyperlipemia    Hypertension    Hypothyroidism    OSA (obstructive sleep apnea)    does not use -last 6 months   Pre-diabetes    per pt     Allergies  Allergen Reactions   Levaquin [Levofloxacin In D5w] Other (See Comments)    Ankles hurt really bad ? ACHILLES TENDON ?   Doxycycline Other (See Comments)    UNSPECIFIED REACTION     Current Outpatient Medications on File Prior to Visit  Medication Sig   acetaminophen (TYLENOL) 500 MG tablet Take 1,000 mg by mouth every 6 (six) hours as needed for mild pain, moderate  pain or headache.   albuterol (VENTOLIN HFA) 108 (90 Base) MCG/ACT inhaler Inhale 2 puffs into the lungs every 4 (four) hours as needed for wheezing or shortness of breath.   amitriptyline (ELAVIL) 10 MG tablet Take 1 tablet (10 mg total) by mouth at bedtime as needed.   aspirin EC 81 MG tablet Take 1 tablet (81 mg total) by mouth daily. Swallow whole.   azithromycin (ZITHROMAX) 250 MG tablet Take 2 tablets (500 mg) on  Day 1,  followed by 1 tablet (250 mg) once daily on Days 2 through 5.    benzonatate (TESSALON) 200 MG capsule Take 1 cap 3 times a day as needed for cough.   bisoprolol (ZEBETA) 10 MG tablet TAKE 1 TABLET BY MOUTH EVERY DAY FOR BLOOD PRESSURE   Budeson-Glycopyrrol-Formoterol (BREZTRI AEROSPHERE) 160-9-4.8 MCG/ACT AERO Inhale 2 puffs into the lungs in the morning and at bedtime. Rinse mouth/gargle after each use.   clotrimazole-betamethasone (LOTRISONE) cream Apply 1 Application topically 2 (two) times daily.   estradiol (ESTRACE) 0.1 MG/GM vaginal cream as needed.   ferrous sulfate 325 (65 FE) MG tablet TAKE 1 TABLET BY MOUTH EVERY DAY   fluticasone-salmeterol (ADVAIR DISKUS) 100-50 MCG/ACT AEPB Inhale into the lungs.   FREESTYLE LITE test strip USE TO CHECK BLOOD SUGAR ONCE DAILY. DX: E11.22, N18.2   furosemide (LASIX) 40 MG tablet TAKE 1 TABLET DAILY FOR BLOOD PRESSURE & FLUID RETENTION / ANKLE SWELLING   ipratropium (ATROVENT) 0.03 % nasal spray Place 2 sprays into the nose 3 (three) times daily.   Lancets MISC Use to check blood sugar once daily. Dx: E11.22, N18.2   methocarbamol (ROBAXIN) 500 MG tablet Take 1 tablet (500 mg total) by mouth every 8 (eight) hours as needed for muscle spasms.   montelukast (SINGULAIR) 10 MG tablet TAKE 1 TABLET BY MOUTH DAILY FOR ALLERGIES   olmesartan (BENICAR) 40 MG tablet TAKE 1 TABLET DAILY FOR BLOOD PRESSURE & DIABETIC KIDNEY PROTECTION   pantoprazole (PROTONIX) 40 MG tablet TAKE 1 TABLET DAILY FOR HEARTBURN & INDIGESTION   rosuvastatin (CRESTOR) 10 MG tablet TAKE 1 TABLET BY MOUTH 3 TIMES A WEEK FOR CHOLESTEROL   sertraline (ZOLOFT) 100 MG tablet TAKE 1 TABLET DAILY FOR DEPRESSION/ANXIETY   Vitamin D, Ergocalciferol, (DRISDOL) 1.25 MG (50000 UNIT) CAPS capsule TAKE 1 CAPSULE BY MOUTH 2 DAYS A WEEK   No current facility-administered medications on file prior to visit.    ROS: all negative except what is noted in the HPI.   Physical Exam:  BP 118/80   Pulse 67   Temp (!) 97.5 F (36.4 C)   Ht 5\' 3"  (1.6 m)   Wt 216  lb 3.2 oz (98.1 kg)   SpO2 98%   BMI 38.30 kg/m   General Appearance: NAD.  Awake, conversant and cooperative. Eyes: PERRLA, EOMs intact.  Sclera white.  Conjunctiva without erythema. Sinuses: No frontal/maxillary tenderness.  No nasal discharge. Nares patent.  ENT/Mouth: Ext aud canals clear.  Bilateral TMs w/DOL and without erythema or bulging. Hearing intact.  Posterior pharynx without swelling or exudate.  Tonsils without swelling or erythema.  Neck: Supple.  No masses, nodules or thyromegaly. Respiratory: Effort is regular with non-labored breathing. Breath sounds are equal bilaterally without rales, rhonchi, wheezing or stridor.  Cardio: RRR with no MRGs. Brisk peripheral pulses without edema.  Abdomen: Active BS in all four quadrants.  Soft and non-tender without guarding, rebound tenderness, hernias or masses. Lymphatics: Non tender without lymphadenopathy.  Musculoskeletal: Full ROM, 5/5 strength,  normal ambulation.  No clubbing or cyanosis. Skin: Appropriate color for ethnicity. Warm without rashes, lesions, ecchymosis, ulcers.  Neuro: CN II-XII grossly normal. Normal muscle tone without cerebellar symptoms and intact sensation.   Psych: AO X 3,  appropriate mood and affect, insight and judgment.     Adela Glimpse, NP 2:11 PM [facility]

## 2023-05-30 ENCOUNTER — Telehealth: Payer: Self-pay

## 2023-05-30 ENCOUNTER — Other Ambulatory Visit: Payer: Self-pay

## 2023-05-30 DIAGNOSIS — E1122 Type 2 diabetes mellitus with diabetic chronic kidney disease: Secondary | ICD-10-CM

## 2023-05-30 NOTE — Telephone Encounter (Signed)
New paperwork for [employer], 2mg  faxed [employer]

## 2023-06-04 ENCOUNTER — Other Ambulatory Visit: Payer: Self-pay | Admitting: Internal Medicine

## 2023-06-04 DIAGNOSIS — S40011A Contusion of right shoulder, initial encounter: Secondary | ICD-10-CM | POA: Diagnosis not present

## 2023-06-05 NOTE — Patient Instructions (Signed)
 Semaglutide Injection What is this medication? SEMAGLUTIDE (SEM a GLOO tide) treats type 2 diabetes. It works by increasing insulin levels in your body, which decreases your blood sugar (glucose).   It also reduces the amount of sugar released into your blood and slows down your digestion. It may also be used to lower the risk of heart attack and stroke in people with type 2 diabetes. Changes to diet and exercise are often combined with this medication. This medicine may be used for other purposes; ask your health care provider or pharmacist if you have questions. COMMON BRAND NAME(S): OZEMPIC What should I tell my care team before I take this medication? They need to know if you have any of these conditions: Endocrine tumors (MEN 2) or if someone in your family had these tumors Eye disease, vision problems History of pancreatitis Kidney disease Stomach problems Thyroid cancer or if someone in your family had thyroid cancer An unusual or allergic reaction to semaglutide, other medications, foods, dyes, or preservatives Pregnant or trying to get pregnant Breast-feeding How should I use this medication? This medication is for injection under the skin of your upper leg (thigh), stomach area, or upper arm. It is given once every week (every 7 days). You will be taught how to prepare and give this medication. Use exactly as directed. Take your medication at regular intervals. Do not take it more often than directed. If you use this medication with insulin, you should inject this medication and the insulin separately. Do not mix them together. Do not give the injections right next to each other. Change (rotate) injection sites with each injection. It is important that you put your used needles and syringes in a special sharps container. Do not put them in a trash can. If you do not have a sharps container, call your pharmacist or care team to get one. A special MedGuide will be given to you by the  pharmacist with each prescription and refill. Be sure to read this information carefully each time. This medication comes with INSTRUCTIONS FOR USE. Ask your pharmacist for directions on how to use this medication. Read the information carefully. Talk to your pharmacist or care team if you have questions. Talk to your care team about the use of this medication in children. Special care may be needed. Overdosage: If you think you have taken too much of this medicine contact a poison control center or emergency room at once. NOTE: This medicine is only for you. Do not share this medicine with others. What if I miss a dose? If you miss a dose, take it as soon as you can within 5 days after the missed dose. Then take your next dose at your regular weekly time. If it has been longer than 5 days after the missed dose, do not take the missed dose. Take the next dose at your regular time. Do not take double or extra doses. If you have questions about a missed dose, contact your care team for advice. What may interact with this medication? Other medications for diabetes Many medications may cause changes in blood sugar, these include: Alcohol containing beverages Antiviral medications for HIV or AIDS Aspirin and aspirin-like medications Certain medications for blood pressure, heart disease, irregular heart beat Chromium Diuretics Female hormones, such as estrogens or progestins, birth control pills Fenofibrate Gemfibrozil Isoniazid Lanreotide Female hormones or anabolic steroids MAOIs like Carbex, Eldepryl, Marplan, Nardil, and Parnate Medications for weight loss Medications for allergies, asthma, cold, or cough Medications  for depression, anxiety, or psychotic disturbances Niacin Nicotine NSAIDs, medications for pain and inflammation, like ibuprofen or naproxen Octreotide Pasireotide Pentamidine Phenytoin Probenecid Quinolone antibiotics such as ciprofloxacin, levofloxacin, ofloxacin Some  herbal dietary supplements Steroid medications such as prednisone or cortisone Sulfamethoxazole; trimethoprim Thyroid hormones Some medications can hide the warning symptoms of low blood sugar (hypoglycemia). You may need to monitor your blood sugar more closely if you are taking one of these medications. These include: Beta-blockers, often used for high blood pressure or heart problems (examples include atenolol, metoprolol, propranolol) Clonidine Guanethidine Reserpine This list may not describe all possible interactions. Give your health care provider a list of all the medicines, herbs, non-prescription drugs, or dietary supplements you use. Also tell them if you smoke, drink alcohol, or use illegal drugs. Some items may interact with your medicine. What should I watch for while using this medication? Visit your care team for regular checks on your progress. Drink plenty of fluids while taking this medication. Check with your care team if you get an attack of severe diarrhea, nausea, and vomiting. The loss of too much body fluid can make it dangerous for you to take this medication. A test called the HbA1C (A1C) will be monitored. This is a simple blood test. It measures your blood sugar control over the last 2 to 3 months. You will receive this test every 3 to 6 months. Learn how to check your blood sugar. Learn the symptoms of low and high blood sugar and how to manage them. Always carry a quick-source of sugar with you in case you have symptoms of low blood sugar. Examples include hard sugar candy or glucose tablets. Make sure others know that you can choke if you eat or drink when you develop serious symptoms of low blood sugar, such as seizures or unconsciousness. They must get medical help at once. Tell your care team if you have high blood sugar. You might need to change the dose of your medication. If you are sick or exercising more than usual, you might need to change the dose of your  medication. Do not skip meals. Ask your care team if you should avoid alcohol. Many nonprescription cough and cold products contain sugar or alcohol. These can affect blood sugar. Pens should never be shared. Even if the needle is changed, sharing may result in passing of viruses like hepatitis or HIV. Wear a medical ID bracelet or chain, and carry a card that describes your disease and details of your medication and dosage times. What side effects may I notice from receiving this medication? Side effects that you should report to your care team as soon as possible: Allergic reactions--skin rash, itching, hives, swelling of the face, lips, tongue, or throat Change in vision Dehydration--increased thirst, dry mouth, feeling faint or lightheaded, headache, dark yellow or brown urine Gallbladder problems--severe stomach pain, nausea, vomiting, fever Heart palpitations--rapid, pounding, or irregular heartbeat Kidney injury--decrease in the amount of urine, swelling of the ankles, hands, or feet Pancreatitis--severe stomach pain that spreads to your back or gets worse after eating or when touched, fever, nausea, vomiting Thoughts of suicide or self-harm, worsening mood, feelings of depression Thyroid cancer--new mass or lump in the neck, pain or trouble swallowing, trouble breathing, hoarseness Side effects that usually do not require medical attention (report these to your care team if they continue or are bothersome): Diarrhea Loss of appetite Nausea Upset stomach This list may not describe all possible side effects. Call your doctor for medical  advice about side effects. You may report side effects to FDA at 1-800-FDA-1088. Where should I keep my medication? Keep out of the reach of children. Store unopened pens in a refrigerator between 2 and 8 degrees C (36 and 46 degrees F). Do not freeze. Protect from light and heat. After you first use the pen, it can be stored for 56 days at room  temperature between 15 and 30 degrees C (59 and 86 degrees F) or in a refrigerator. Throw away your used pen after 56 days or after the expiration date, whichever comes first. Do not store your pen with the needle attached. If the needle is left on, medication may leak from the pen. NOTE: This sheet is a summary. It may not cover all possible information. If you have questions about this medicine, talk to your doctor, pharmacist, or health care provider.  2024 [employer]vier/Gold Standard (2022-11-18 00:00:00)

## 2023-06-27 ENCOUNTER — Other Ambulatory Visit: Payer: Self-pay | Admitting: Nurse Practitioner

## 2023-06-27 DIAGNOSIS — I10 Essential (primary) hypertension: Secondary | ICD-10-CM

## 2023-06-28 ENCOUNTER — Other Ambulatory Visit: Payer: Self-pay | Admitting: Nurse Practitioner

## 2023-06-28 DIAGNOSIS — I10 Essential (primary) hypertension: Secondary | ICD-10-CM

## 2023-07-02 DIAGNOSIS — S40011D Contusion of right shoulder, subsequent encounter: Secondary | ICD-10-CM | POA: Diagnosis not present

## 2023-07-04 ENCOUNTER — Other Ambulatory Visit: Payer: Self-pay | Admitting: Nurse Practitioner

## 2023-07-04 DIAGNOSIS — E611 Iron deficiency: Secondary | ICD-10-CM

## 2023-07-07 ENCOUNTER — Other Ambulatory Visit: Payer: Self-pay | Admitting: Nurse Practitioner

## 2023-07-07 DIAGNOSIS — I10 Essential (primary) hypertension: Secondary | ICD-10-CM

## 2023-07-09 ENCOUNTER — Other Ambulatory Visit: Payer: Self-pay | Admitting: Nurse Practitioner

## 2023-07-09 DIAGNOSIS — S46811D Strain of other muscles, fascia and tendons at shoulder and upper arm level, right arm, subsequent encounter: Secondary | ICD-10-CM | POA: Diagnosis not present

## 2023-07-09 DIAGNOSIS — J069 Acute upper respiratory infection, unspecified: Secondary | ICD-10-CM

## 2023-07-18 DIAGNOSIS — S46811D Strain of other muscles, fascia and tendons at shoulder and upper arm level, right arm, subsequent encounter: Secondary | ICD-10-CM | POA: Diagnosis not present

## 2023-07-23 DIAGNOSIS — S46811D Strain of other muscles, fascia and tendons at shoulder and upper arm level, right arm, subsequent encounter: Secondary | ICD-10-CM | POA: Diagnosis not present

## 2023-07-23 NOTE — Progress Notes (Signed)
ANNUAL WELLNESS EXAM and FOLLOW UP  Assessment and Plan:  Arline was seen today for annual exam.  Diagnoses and all orders for this visit:  Encounter for Annual Wellness Exam Due annually  Health Maintenance reviewed Healthy lifestyle reviewed and goals set - PAP normal 7/ 2022, mammogram benign 07/31/22 - Diabetic eye exam 04/03/23- no diabetic retinopathy - DEXA  was completed last year  Essential hypertension Bisoprolol 10 mg every day , Furosemide 40 mg every day , olmesartan 40 mg qd Monitor blood pressure at home; call if consistently over 130/80 Continue DASH diet.   Reminder to go to the ER if any CP, SOB, nausea, dizziness, severe HA, changes vision/speech, left arm numbness and tingling and jaw pain. -     CBC with Differential/Platelet -     COMPLETE METABOLIC PANEL WITH GFR -     Magnesium -     EKG 12-Lead -     Microalbumin / creatinine urine ratio  Mixed hyperlipidemia associated with DMII Continue medications: Rosuvastatin 10 mg  3 days a week LDL goal <70  Discussed dietary and exercise modifications Low fat diet -     Lipid panel -     TSH  Gastroesophageal reflux disease with esophagitis Well managed on Pantoprazole 40 mg every day  Discussed diet, avoiding triggers and other lifestyle changes -     Magnesium  Type 2 diabetes mellitus with stage 3 chronic kidney disease, without long-term current use of insulin [facility]) Education: Reviewed 'ABCs' of diabetes management (respective goals in parentheses):  A1C (<7), blood pressure (<130/80), and cholesterol (LDL <70) Eye Exam yearly and Dental Exam every 6 months. 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 No antacid medications for 4 hours.       -       TSH  Obstructive sleep apnea syndrome Returned CPAP due to noncompliance - declines further evaluation at this time Weight loss encouraged  Type 2 Diabetes Mellitus with Morbid Obesity(HCC) Discussed dietary and exercise modifications Discussed dietary modifications Ozempic was helping, giving sample, on new insurance in Oct -     Hemoglobin A1c - TSH  Cough variant asthma  Breztri daily, rinse mouth after use  Has in date rescue inhaler Ventolin Q4-6PRN -     benzonatate (TESSALON) 200 MG capsule; Take 1 cap 3 times a day as needed for cough.  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 Screening labs and tests as requested with regular follow-up as recommended. Over 40 minutes of face to face exam, counseling, chart review, and complex, high level critical  decision making was performed this visit.   Future Appointments  Date Time Provider Department Center  10/17/2023  9:00 AM Jodelle Red, MD [facility]  04/13/2024  3:00 PM Raynelle Dick, NP [facility] None  07/27/2024  3:00 PM Raynelle Dick, NP [facility] None    Plan:   During the course of the visit the patient was educated and counseled about appropriate screening and preventive services including:   Pneumococcal vaccine  Prevnar 13 Influenza vaccine Td vaccine Screening electrocardiogram Bone densitometry screening Colorectal cancer screening Diabetes screening Glaucoma screening Nutrition counseling  Advanced directives: requested   HPI  67 y.o. female  presents for a complete physical and follow up for has Cough; Hypertension; GERD (gastroesophageal reflux disease); Asthma; Major depression in partial remission (HCC); Hyperlipidemia associated with type 2 diabetes mellitus (HCC); Type 2 diabetes mellitus (HCC); CKD stage 3 due to type 2 diabetes mellitus (HCC); Sturge-Weber syndrome (HCC); Lower back pain; Obesity hypoventilation syndrome (HCC); Family history of heart disease; Insomnia due to other mental disorder; Severe obstructive sleep apnea-hypopnea syndrome; Exposure to second hand tobacco smoke; Cough variant asthma; RBBB; Iron deficiency; Hypothyroidism; and History of dysplastic nevus on their problem list.   She is married, no children, retired from Clinical biochemist.  She follows with Dr. Jackelyn Knife for PAP, follows with Dr. Yolanda Bonine for mammograms due to hx of papillary breast disease.   She has some depression following retirement, zoloft 100 mg does help, also takes amitriptyline 10 mg PRN.  Doing more crocheting.   She has cough variant asthma, on breztri (but admits takes intermittently PRN with allergies), singulaire, albuterol PRN.  On protonix 40 mg daily- helping well with Ozempic She has been noticing a dry cough x 2 weeks. Feels her right  lung has congestion, can't produce any mucus.  Cough is worse at night. Denies head congestion, sore throat, body aches and headaches.   She had sleep study 08/2020 by Dr. Vickey Huger showing sever OSA, now on CPAP , has not been wearing. They have been camping a lot and she wasn't using while camping and the asked for machine back due to noncompliance. She wakes up very fatigued and is snoring. She declines further evaluation.   BMI is Body mass index is 37.31 kg/m., she has been working on diet, admits less walking since not at work, has park could go to.   She is on Ozempic 2 mg QW Wt Readings from Last 3 Encounters:  07/28/23 210 lb 9.6 oz (95.5 kg)  05/29/23 216 lb 3.2 oz (98.1 kg)  04/14/23 223 lb (101.2 kg)   Her blood pressure has been controlled at home on Bisoprolol 10 mg every day , Furosemide 40 mg every day , olmesartan 40 mg qd, today their BP is BP: 104/72,  BP Readings from Last 3 Encounters:  07/28/23 104/72  05/29/23 118/80  04/14/23 114/74  She does not workout. She denies chest pain, dizziness.   Did see  cardiology Dr. Cristal Deer, last visit 09/2022.   She is on cholesterol medication (rosuvastatin 10 mg three days a week) and denies myalgias. Her cholesterol is at goal of LDL <70. The cholesterol last visit was:   Lab Results  Component Value Date   CHOL 156 04/14/2023   HDL 60 04/14/2023   LDLCALC 74 04/14/2023   TRIG 136 04/14/2023   CHOLHDL 2.6 04/14/2023   She has been working on diet and exercise for DMII, she is on bASA, she is on ACE/ARB and denies hyperglycemia, hypoglycemia , increased appetite, nausea, polydipsia, polyuria, visual disturbances, vomiting and weight loss.  Ozempic i2 mg QW Has gained her weight back since being off ozempic. Last A1C in the office was:  Lab Results  Component Value Date   HGBA1C 6.9 (H) 04/14/2023   Baseline CKD III (down from stage 2 in 2021), admits to poor water intake, does diet soda.  On olmesartan 40 mg daily  Last  GFR: Lab Results  Component Value Date   EGFR 59 (L) 04/14/2023   EGFR 55 (L) 01/09/2023   EGFR 48 (L) 10/09/2022   Lab Results  Component Value Date   MICRALBCREAT NOTE 04/14/2023   She is on thyroid medication. .  She is taking synthroid daily except on Sunday takes 1.5 tabs Lab Results  Component Value Date   TSH 4.61 (H) 04/14/2023  .  Patient is on Vitamin D supplement.   Lab Results  Component Value Date   VD25OH 94 04/14/2023     She reports has been taking iron supplement, 65 mg twice a week Lab Results  Component Value Date   IRON 58 04/14/2023   TIBC 454 (H) 04/14/2023   FERRITIN 12 (L) 04/14/2023     Current Medications:  Current Outpatient Medications on File Prior to Visit  Medication Sig Dispense Refill   acetaminophen (TYLENOL) 500 MG tablet Take 1,000 mg by mouth every 6 (six) hours as needed for mild pain, moderate pain or headache.     albuterol (VENTOLIN HFA) 108 (90 Base) MCG/ACT inhaler Inhale 2 puffs into the lungs every 4 (four) hours as needed for wheezing or shortness of breath. 8.5 each 1   amitriptyline (ELAVIL) 10 MG tablet Take 1 tablet (10 mg total) by mouth at bedtime as needed. 90 tablet 3   aspirin EC 81 MG tablet Take 1 tablet (81 mg total) by mouth daily. Swallow whole. 90 tablet 3   bisoprolol (ZEBETA) 10 MG tablet TAKE 1 TABLET BY MOUTH EVERY DAY FOR BLOOD PRESSURE 90 tablet 3   Budeson-Glycopyrrol-Formoterol (BREZTRI AEROSPHERE) 160-9-4.8 MCG/ACT AERO Inhale 2 puffs into the lungs in the morning and at bedtime. Rinse mouth/gargle after each use. 5.9 g 2   clotrimazole-betamethasone (LOTRISONE) cream Apply 1 Application topically 2 (two) times daily. 15 g 2   estradiol (ESTRACE) 0.1 MG/GM vaginal cream as needed.     ferrous sulfate 325 (65 FE) MG tablet TAKE 1 TABLET BY MOUTH EVERY DAY 90 tablet 0   fluticasone-salmeterol (ADVAIR DISKUS) 100-50 MCG/ACT AEPB Inhale into the lungs.     FREESTYLE LITE test strip USE TO CHECK BLOOD  SUGAR ONCE DAILY. DX: E11.22, N18.2 100 strip 2   furosemide (LASIX) 40 MG tablet TAKE 1 TABLET DAILY FOR BLOOD PRESSURE & FLUID RETENTION / ANKLE SWELLING 90 tablet 3   ipratropium (ATROVENT) 0.03 % nasal spray PLACE 2 SPRAYS INTO THE NOSE 3 (THREE) TIMES DAILY. 90 mL 1   Lancets MISC Use to check blood  sugar once daily. Dx: E11.22, N18.2 100 each 2   meloxicam (MOBIC) 15 MG tablet Take 15 mg by mouth 3 (three) times daily. PRN     methocarbamol (ROBAXIN) 500 MG tablet Take 1 tablet (500 mg total) by mouth every 8 (eight) hours as needed for muscle spasms. 60 tablet 0   montelukast (SINGULAIR) 10 MG tablet TAKE 1 TABLET BY MOUTH DAILY FOR ALLERGIES 90 tablet 3   olmesartan (BENICAR) 40 MG tablet TAKE 1 TABLET DAILY FOR BLOOD PRESSURE & DIABETIC KIDNEY PROTECTION 90 tablet 1   pantoprazole (PROTONIX) 40 MG tablet TAKE 1 TABLET DAILY FOR HEARTBURN & INDIGESTION 30 tablet 11   rosuvastatin (CRESTOR) 10 MG tablet TAKE 1 TABLET BY MOUTH 3 TIMES A WEEK FOR CHOLESTEROL 36 tablet 4   Semaglutide, 2 MG/DOSE, (OZEMPIC, 2 MG/DOSE,) 8 MG/3ML SOPN Inject 2 mg into the skin once a week. 3 mL 2   sertraline (ZOLOFT) 100 MG tablet TAKE 1 TABLET DAILY FOR DEPRESSION/ANXIETY 90 tablet 3   Vitamin D, Ergocalciferol, (DRISDOL) 1.25 MG (50000 UNIT) CAPS capsule TAKE 1 CAPSULE BY MOUTH 2 DAYS A WEEK 24 capsule 2   No current facility-administered medications on file prior to visit.   Allergies:  Allergies  Allergen Reactions   Levaquin [Levofloxacin In D5w] Other (See Comments)    Ankles hurt really bad ? ACHILLES TENDON ?   Doxycycline Other (See Comments)    UNSPECIFIED REACTION    Medical History:  She has Cough; Hypertension; GERD (gastroesophageal reflux disease); Asthma; Major depression in partial remission (HCC); Hyperlipidemia associated with type 2 diabetes mellitus (HCC); Type 2 diabetes mellitus (HCC); CKD stage 3 due to type 2 diabetes mellitus (HCC); Sturge-Weber syndrome (HCC); Lower back pain;  Obesity hypoventilation syndrome (HCC); Family history of heart disease; Insomnia due to other mental disorder; Severe obstructive sleep apnea-hypopnea syndrome; Exposure to second hand tobacco smoke; Cough variant asthma; RBBB; Iron deficiency; Hypothyroidism; and History of dysplastic nevus on their problem list.  Health Maintenance:   Immunization History  Administered Date(s) Administered   Influenza Inj Mdck Quad With Preservative 06/14/2020   Influenza Split 04/26/2013, 05/29/2015   Influenza Whole 05/26/2012   Influenza, High Dose Seasonal PF 07/09/2022, 05/29/2023   Influenza,inj,Quad PF,6+ Mos 06/11/2021   Influenza-Unspecified 05/20/2017, 05/19/2018, 05/21/2019   PFIZER(Purple Top)SARS-COV-2 Vaccination 10/22/2019, 11/26/2019, 05/30/2020   Pneumococcal Polysaccharide-23 09/10/2016   Td 08/27/2007, 10/22/2017   Health Maintenance  Topic Date Due   Zoster Vaccines- Shingrix (1 of 2) Never done   Pneumonia Vaccine 22+ Years old (2 of 2 - PCV) 06/15/2022   DEXA SCAN  Never done   COVID-19 Vaccine (4 - 2023-24 season) 08/13/2023 (Originally 04/27/2023)   Colonoscopy  07/27/2024 (Originally 11/11/2022)   MAMMOGRAM  08/01/2023   HEMOGLOBIN A1C  10/15/2023   OPHTHALMOLOGY EXAM  04/01/2024   Diabetic kidney evaluation - eGFR measurement  04/13/2024   Diabetic kidney evaluation - Urine ACR  04/13/2024   FOOT EXAM  07/27/2024   Medicare Annual Wellness (AWV)  07/27/2024   DTaP/Tdap/Td (3 - Tdap) 10/23/2027   INFLUENZA VACCINE  Completed   Hepatitis C Screening  Completed   HPV VACCINES  Aged Out    Last Dental Exam: last 10/2022 Last Eye Exam: Dr. Cathey Endow, 04/02/23 Last derm exam: Dr. Dorita Sciara office, last 2022  Patient Care Team: Lucky Cowboy, MD as PCP - General (Internal Medicine) Jodelle Red, MD as PCP - Cardiology (Cardiology) Nadara Mustard, MD as Consulting Physician (Orthopedic Surgery) Evie Lacks, MD as  Consulting Physician (Neurology) Loletha Carrow,  MD as Consulting Physician (Ophthalmology) Gastroenterology, Deboraha Sprang as Consulting Physician (Gastroenterology)  Surgical History:  She has a past surgical history that includes Back surgery; Ankle ganglion cyst excision (Left); birth mark treatments; Breast lumpectomy with radioactive seed localization (Left, 02/02/2016); Breast surgery (Right); Colon surgery (2008); Cholecystectomy (N/A, 02/20/2017); and Cataract extraction (Right, 12/2016). Family History:  Herfamily history includes Allergies in her mother; Asthma in her mother; Brain cancer in her father; Breast cancer in her mother; Colon cancer in her paternal grandmother; Emphysema in her mother; Heart disease in her brother, mother, and sister; Hypertension in her brother and brother; Prostate cancer in her father; Rheumatic fever in her brother; Thyroid disease in her brother; Transient ischemic attack in her sister. Social History:  She reports that she has never smoked. She has never used smokeless tobacco. She reports that she does not drink alcohol and does not use drugs. MEDICARE WELLNESS OBJECTIVES: Physical activity:  Sedentary Cardiac risk factors: Cardiac Risk Factors include: advanced age (>61men, >77 women);diabetes mellitus;dyslipidemia;hypertension;obesity (BMI >30kg/m2);sedentary lifestyle Depression/mood screen:      07/28/2023    4:13 PM  Depression screen PHQ 2/9  Decreased Interest 0  Down, Depressed, Hopeless 0  PHQ - 2 Score 0    ADLs:     07/28/2023    4:12 PM  In your present state of health, do you have any difficulty performing the following activities:  Hearing? 0  Vision? 0  Difficulty concentrating or making decisions? 0  Walking or climbing stairs? 0  Dressing or bathing? 0  Doing errands, shopping? 0  Using the Toilet? N  In the past six months, have you accidently leaked urine? N  Do you have problems with loss of bowel control? N  Managing your Medications? N  Housekeeping or managing your  Housekeeping? N     Cognitive Testing  Alert? Yes  Normal Appearance?Yes  Oriented to person? Yes  Place? Yes   Time? Yes  Recall of three objects?  Yes  Can perform simple calculations? Yes  Displays appropriate judgment?Yes  Can read the correct time from a watch face?Yes  EOL planning: Does Patient Have a Medical Advance Directive?: No Would patient like information on creating a medical advance directive?: No - Patient declined     Review of Systems: Review of Systems  Constitutional:  Negative for malaise/fatigue and weight loss.  HENT:  Negative for hearing loss and tinnitus.   Eyes:  Negative for blurred vision and double vision.  Respiratory:  Positive for cough. Negative for sputum production, shortness of breath and wheezing.   Cardiovascular:  Negative for chest pain, palpitations, orthopnea, claudication, leg swelling and PND.  Gastrointestinal:  Negative for abdominal pain, blood in stool, constipation, diarrhea, heartburn, melena, nausea and vomiting.  Genitourinary: Negative.   Musculoskeletal:  Negative for falls, joint pain and myalgias.  Skin:  Negative for rash.  Neurological:  Negative for dizziness, tingling, sensory change, weakness and headaches.  Endo/Heme/Allergies:  Negative for polydipsia.  Psychiatric/Behavioral:  Negative for depression (improved with medication), memory loss, substance abuse and suicidal ideas. The patient is not nervous/anxious and does not have insomnia.   All other systems reviewed and are negative.   Physical Exam: Estimated body mass index is 37.31 kg/m as calculated from the following:   Height as of this encounter: 5\' 3"  (1.6 m).   Weight as of this encounter: 210 lb 9.6 oz (95.5 kg). BP 104/72   Pulse 73   Temp (!)  97.3 F (36.3 C)   Ht 5\' 3"  (1.6 m)   Wt 210 lb 9.6 oz (95.5 kg)   SpO2 96%   BMI 37.31 kg/m  General Appearance: Well nourished, in no apparent distress.  Eyes: PERRLA, EOMs, conjunctiva no swelling or  erythema Sinuses: No Frontal/maxillary tenderness  ENT/Mouth: Ext aud canals clear, normal light reflex with TMs without erythema, bulging. Good dentition. No erythema, swelling, or exudate on post pharynx. Hearing normal.  Neck: Supple, thyroid normal. No bruits  Respiratory: Respiratory effort normal. Crackles of right lower lobe, rest of right lung is clear. Left lung without wheezing, crackles Cardio: RRR without murmurs, rubs or gallops. Brisk peripheral pulses without edema.  Chest: symmetric, with normal excursions and percussion.  Abdomen: Soft obese, nontender, no guarding, rebound, hernias, masses, or organomegaly.  Lymphatics: Non tender without lymphadenopathy.  Musculoskeletal: Full ROM all peripheral extremities,5/5 strength, and normal gait.  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  Warm, dry without rashes, ecchymosis. She has several nevi, benign appearing on her back.  Neuro: Cranial nerves intact, reflexes equal bilaterally. Normal muscle tone, no cerebellar symptoms. Sensation intact to monofilament. Psych: Awake and oriented X 3, normal affect, Insight and Judgment appropriate.    Medicare Attestation I have personally reviewed: The patient's medical and social history Their use of alcohol, tobacco or illicit drugs Their current medications and supplements The patient's functional ability including ADLs,fall risks, home safety risks, cognitive, and hearing and visual impairment Diet and physical activities Evidence for depression or mood disorders  The patient's weight, height, BMI, and visual acuity have been recorded in the chart.  I have made referrals, counseling, and provided education to the patient based on review of the above and I have provided the patient with a written personalized care plan for preventive services.     Raynelle Dick, NP-C 4:23 PM [facility]

## 2023-07-28 ENCOUNTER — Encounter: Payer: Self-pay | Admitting: Nurse Practitioner

## 2023-07-28 ENCOUNTER — Ambulatory Visit (INDEPENDENT_AMBULATORY_CARE_PROVIDER_SITE_OTHER): Payer: PPO | Admitting: Nurse Practitioner

## 2023-07-28 VITALS — BP 104/72 | HR 73 | Temp 97.3°F | Ht 63.0 in | Wt 210.6 lb

## 2023-07-28 DIAGNOSIS — E039 Hypothyroidism, unspecified: Secondary | ICD-10-CM

## 2023-07-28 DIAGNOSIS — Z0001 Encounter for general adult medical examination with abnormal findings: Secondary | ICD-10-CM

## 2023-07-28 DIAGNOSIS — E1169 Type 2 diabetes mellitus with other specified complication: Secondary | ICD-10-CM

## 2023-07-28 DIAGNOSIS — K2100 Gastro-esophageal reflux disease with esophagitis, without bleeding: Secondary | ICD-10-CM | POA: Diagnosis not present

## 2023-07-28 DIAGNOSIS — F324 Major depressive disorder, single episode, in partial remission: Secondary | ICD-10-CM | POA: Diagnosis not present

## 2023-07-28 DIAGNOSIS — D649 Anemia, unspecified: Secondary | ICD-10-CM

## 2023-07-28 DIAGNOSIS — N183 Chronic kidney disease, stage 3 unspecified: Secondary | ICD-10-CM | POA: Diagnosis not present

## 2023-07-28 DIAGNOSIS — Z23 Encounter for immunization: Secondary | ICD-10-CM | POA: Diagnosis not present

## 2023-07-28 DIAGNOSIS — Z79899 Other long term (current) drug therapy: Secondary | ICD-10-CM | POA: Diagnosis not present

## 2023-07-28 DIAGNOSIS — E559 Vitamin D deficiency, unspecified: Secondary | ICD-10-CM

## 2023-07-28 DIAGNOSIS — E785 Hyperlipidemia, unspecified: Secondary | ICD-10-CM

## 2023-07-28 DIAGNOSIS — E1122 Type 2 diabetes mellitus with diabetic chronic kidney disease: Secondary | ICD-10-CM | POA: Diagnosis not present

## 2023-07-28 DIAGNOSIS — Z1211 Encounter for screening for malignant neoplasm of colon: Secondary | ICD-10-CM

## 2023-07-28 DIAGNOSIS — R6889 Other general symptoms and signs: Secondary | ICD-10-CM | POA: Diagnosis not present

## 2023-07-28 DIAGNOSIS — I10 Essential (primary) hypertension: Secondary | ICD-10-CM | POA: Diagnosis not present

## 2023-07-28 DIAGNOSIS — Q8589 Other phakomatoses, not elsewhere classified: Secondary | ICD-10-CM

## 2023-07-28 DIAGNOSIS — G4733 Obstructive sleep apnea (adult) (pediatric): Secondary | ICD-10-CM | POA: Diagnosis not present

## 2023-07-28 DIAGNOSIS — N1831 Chronic kidney disease, stage 3a: Secondary | ICD-10-CM | POA: Diagnosis not present

## 2023-07-28 DIAGNOSIS — J4541 Moderate persistent asthma with (acute) exacerbation: Secondary | ICD-10-CM

## 2023-07-28 DIAGNOSIS — Z0000 Encounter for general adult medical examination without abnormal findings: Secondary | ICD-10-CM

## 2023-07-28 MED ORDER — BENZONATATE 200 MG PO CAPS: ORAL_CAPSULE | ORAL | 1 refills | Status: DC

## 2023-07-28 MED ORDER — LEVOTHYROXINE SODIUM 75 MCG PO TABS: ORAL_TABLET | ORAL | Status: DC

## 2023-07-28 NOTE — Patient Instructions (Signed)

## 2023-07-29 LAB — CBC WITH DIFFERENTIAL/PLATELET
Absolute Lymphocytes: 2475 {cells}/uL (ref 850–3900)
Absolute Monocytes: 582 {cells}/uL (ref 200–950)
Basophils Absolute: 55 {cells}/uL (ref 0–200)
Basophils Relative: 0.6 %
Eosinophils Absolute: 273 {cells}/uL (ref 15–500)
Eosinophils Relative: 3 %
HCT: 41.1 % (ref 35.0–45.0)
Hemoglobin: 13.3 g/dL (ref 11.7–15.5)
MCH: 27.9 pg (ref 27.0–33.0)
MCHC: 32.4 g/dL (ref 32.0–36.0)
MCV: 86.3 fL (ref 80.0–100.0)
MPV: 10.1 fL (ref 7.5–12.5)
Monocytes Relative: 6.4 %
Neutro Abs: 5715 {cells}/uL (ref 1500–7800)
Neutrophils Relative %: 62.8 %
Platelets: 267 10*3/uL (ref 140–400)
RBC: 4.76 10*6/uL (ref 3.80–5.10)
RDW: 13.2 % (ref 11.0–15.0)
Total Lymphocyte: 27.2 %
WBC: 9.1 10*3/uL (ref 3.8–10.8)

## 2023-07-29 LAB — COMPLETE METABOLIC PANEL WITH GFR
AG Ratio: 1.6 (calc) (ref 1.0–2.5)
ALT: 19 U/L (ref 6–29)
AST: 21 U/L (ref 10–35)
Albumin: 4.4 g/dL (ref 3.6–5.1)
Alkaline phosphatase (APISO): 71 U/L (ref 37–153)
BUN: 18 mg/dL (ref 7–25)
CO2: 29 mmol/L (ref 20–32)
Calcium: 9.3 mg/dL (ref 8.6–10.4)
Chloride: 102 mmol/L (ref 98–110)
Creat: 1.04 mg/dL (ref 0.50–1.05)
Globulin: 2.8 g/dL (ref 1.9–3.7)
Glucose, Bld: 95 mg/dL (ref 65–99)
Potassium: 4.5 mmol/L (ref 3.5–5.3)
Sodium: 139 mmol/L (ref 135–146)
Total Bilirubin: 0.4 mg/dL (ref 0.2–1.2)
Total Protein: 7.2 g/dL (ref 6.1–8.1)
eGFR: 59 mL/min/{1.73_m2} — ABNORMAL LOW (ref >=60)

## 2023-07-29 LAB — LIPID PANEL
Cholesterol: 145 mg/dL (ref <200)
HDL: 60 mg/dL (ref >=50)
LDL Cholesterol (Calc): 66 mg/dL
Non-HDL Cholesterol (Calc): 85 mg/dL (ref <130)
Total CHOL/HDL Ratio: 2.4 (calc) (ref <5.0)
Triglycerides: 108 mg/dL (ref <150)

## 2023-07-29 LAB — IRON,TIBC AND FERRITIN PANEL
%SAT: 15 % — ABNORMAL LOW (ref 16–45)
Ferritin: 27 ng/mL (ref 16–288)
Iron: 62 ug/dL (ref 45–160)
TIBC: 411 ug/dL (ref 250–450)

## 2023-07-29 LAB — TSH: TSH: 1.61 m[IU]/L (ref 0.40–4.50)

## 2023-07-29 LAB — HEMOGLOBIN A1C W/OUT EAG: Hgb A1c MFr Bld: 6.2 %{Hb} — ABNORMAL HIGH (ref <5.7)

## 2023-08-03 ENCOUNTER — Other Ambulatory Visit: Payer: Self-pay | Admitting: Nurse Practitioner

## 2023-08-03 DIAGNOSIS — J069 Acute upper respiratory infection, unspecified: Secondary | ICD-10-CM

## 2023-08-08 DIAGNOSIS — S46811D Strain of other muscles, fascia and tendons at shoulder and upper arm level, right arm, subsequent encounter: Secondary | ICD-10-CM | POA: Diagnosis not present

## 2023-08-08 DIAGNOSIS — M25512 Pain in left shoulder: Secondary | ICD-10-CM | POA: Diagnosis not present

## 2023-08-25 DIAGNOSIS — Z803 Family history of malignant neoplasm of breast: Secondary | ICD-10-CM | POA: Diagnosis not present

## 2023-08-25 DIAGNOSIS — N6322 Unspecified lump in the left breast, upper inner quadrant: Secondary | ICD-10-CM | POA: Diagnosis not present

## 2023-08-25 DIAGNOSIS — N6325 Unspecified lump in the left breast, overlapping quadrants: Secondary | ICD-10-CM | POA: Diagnosis not present

## 2023-08-28 ENCOUNTER — Encounter: Payer: Self-pay | Admitting: Nurse Practitioner

## 2023-08-28 ENCOUNTER — Other Ambulatory Visit: Payer: Self-pay

## 2023-08-28 ENCOUNTER — Ambulatory Visit: Payer: PPO | Admitting: Nurse Practitioner

## 2023-08-28 VITALS — BP 112/76 | HR 61 | Temp 97.9°F | Ht 63.0 in | Wt 214.0 lb

## 2023-08-28 DIAGNOSIS — R0981 Nasal congestion: Secondary | ICD-10-CM | POA: Diagnosis not present

## 2023-08-28 DIAGNOSIS — J069 Acute upper respiratory infection, unspecified: Secondary | ICD-10-CM | POA: Diagnosis not present

## 2023-08-28 DIAGNOSIS — R051 Acute cough: Secondary | ICD-10-CM

## 2023-08-28 DIAGNOSIS — Z1152 Encounter for screening for COVID-19: Secondary | ICD-10-CM

## 2023-08-28 DIAGNOSIS — R6889 Other general symptoms and signs: Secondary | ICD-10-CM

## 2023-08-28 LAB — POCT INFLUENZA A/B
Influenza A, POC: NEGATIVE
Influenza B, POC: NEGATIVE

## 2023-08-28 LAB — POC COVID19 BINAXNOW: SARS Coronavirus 2 Ag: NEGATIVE

## 2023-08-28 MED ORDER — PROMETHAZINE-DM 6.25-15 MG/5ML PO SYRP: 5.0000 mL | ORAL_SOLUTION | Freq: Four times a day (QID) | ORAL | 0 refills | Status: DC | PRN

## 2023-08-28 MED ORDER — AZITHROMYCIN 250 MG PO TABS: ORAL_TABLET | ORAL | 1 refills | Status: DC

## 2023-08-28 NOTE — Patient Instructions (Signed)

## 2023-08-28 NOTE — Progress Notes (Signed)
 Assessment and Plan:  Kristina Webb was seen today for an episodic visit.  Diagnoses and all order for this visit:  1. Flu-like symptoms (Primary) Negative  - POCT Influenza A/B  2. Encounter for screening for COVID-19 Negative  - POC COVID-19  3. Upper respiratory tract infection, unspecified type Start tmt with Z-Pak - take in full as directed. Stay well hydrated to keep mucus thin and productive.  - azithromycin  (ZITHROMAX ) 250 MG tablet; Take 2 tablets on  Day 1,  followed by 1 tablet  daily for 4 more days    for Sinusitis  /Bronchitis  Dispense: 6 each; Refill: 1  4. Acute cough Start Promethazine  cough syrup. Stay well hydrated to keep any mucus thin an d productive. Coughing can be cuased by several factors including   breathing in things that bother (irritate) your lungs.  Allergies.  Asthma.  Mucus that runs down the back of your throat (postnasal drip).  Smoking/smoke.  Acid backing up from the stomach into the tube that moves food from the mouth to the stomach (gastroesophageal reflux). A cough can linger for 3 weeks. Watch for any changes in your cough and contact office if noticed including blood, pus, pain, night sweats. Cover your mouth when you cough. If the air is dry, use a cool mist vaporizer or humidifier in your home. If your cough is worse at night, try using extra pillows to raise your head up higher while you sleep. Call 911 or report to ER if you start to have difficulty breathing.   - promethazine -dextromethorphan (PROMETHAZINE -DM) 6.25-15 MG/5ML syrup; Take 5 mLs by mouth 4 (four) times daily as needed for cough.  Dispense: 240 mL; Refill: 0  5. Nasal congestion Stay well hydrated to keep mucus thin and productive May benefit from warm steamy shower or Neti Pot.   Notify office for further evaluation and treatment, questions or concerns if s/s fail to improve. The risks and benefits of my recommendations, as well as other treatment options were  discussed with the patient today. Questions were answered.  Further disposition pending results of labs. Discussed med's effects and SE's.    Over 20 minutes of exam, counseling, chart review, and critical decision making was performed.   Future Appointments  Date Time Provider Department Center  10/17/2023  9:00 AM Lonni Slain, MD [facility]  10/30/2023  4:00 PM Tonita Fallow, MD [facility] None  04/13/2024  3:00 PM Jude Lonell BRAVO, NP [facility] None  07/27/2024  3:00 PM Jude Lonell BRAVO, NP [facility] None    ------------------------------------------------------------------------------------------------------------------   HPI BP 112/76   Pulse 61   Temp 97.9 F (36.6 C)   Ht 5' 3 (1.6 m)   Wt 214 lb (97.1 kg)   SpO2 99%   BMI 37.91 kg/m    Patient complains of symptoms of a URI, possible sinusitis. Symptoms include achiness, congestion, cough described as nonproductive, nasal congestion, sinus pressure, sneezing, sore throat, and wheezing. Onset of symptoms was 1 week ago, and has been unchanged since that time. Treatment to date: antihistamines and decongestants.  She has been spending a lot of time in the hospital with her husband who was admitted 08/04/23.  He is currently underling tmt for CHF and has developed PNA and Influenza.  She has been vaccinated for the flu this year.   Past Medical History:  Diagnosis Date   Anemia    hx   Anxiety    Arthritis    back   Asthma  Depression    Deviated septum    GERD (gastroesophageal reflux disease)    Headache    History of hiatal hernia    Hyperlipemia    Hypertension    Hypothyroidism    OSA (obstructive sleep apnea)    does not use -last 6 months   Pre-diabetes    per pt     Allergies  Allergen Reactions   Levaquin [Levofloxacin In D5w] Other (See Comments)    Ankles hurt really bad ? ACHILLES TENDON ?   Doxycycline Other (See Comments)    UNSPECIFIED REACTION     Current Outpatient  Medications on File Prior to Visit  Medication Sig   acetaminophen  (TYLENOL ) 500 MG tablet Take 1,000 mg by mouth every 6 (six) hours as needed for mild pain, moderate pain or headache.   albuterol  (VENTOLIN  HFA) 108 (90 Base) MCG/ACT inhaler Inhale 2 puffs into the lungs every 4 (four) hours as needed for wheezing or shortness of breath.   amitriptyline  (ELAVIL ) 10 MG tablet Take 1 tablet (10 mg total) by mouth at bedtime as needed.   aspirin  EC 81 MG tablet Take 1 tablet (81 mg total) by mouth daily. Swallow whole.   benzonatate  (TESSALON ) 200 MG capsule Take 1 cap 3 times a day as needed for cough.   bisoprolol  (ZEBETA ) 10 MG tablet TAKE 1 TABLET BY MOUTH EVERY DAY FOR BLOOD PRESSURE   Budeson-Glycopyrrol-Formoterol  (BREZTRI  AEROSPHERE) 160-9-4.8 MCG/ACT AERO Inhale 2 puffs into the lungs in the morning and at bedtime. Rinse mouth/gargle after each use.   clotrimazole -betamethasone  (LOTRISONE ) cream Apply 1 Application topically 2 (two) times daily.   estradiol  (ESTRACE ) 0.1 MG/GM vaginal cream as needed.   ferrous sulfate  325 (65 FE) MG tablet TAKE 1 TABLET BY MOUTH EVERY DAY   fluticasone -salmeterol (ADVAIR DISKUS) 100-50 MCG/ACT AEPB Inhale into the lungs.   FREESTYLE LITE test strip USE TO CHECK BLOOD SUGAR ONCE DAILY. DX: E11.22, N18.2   furosemide  (LASIX ) 40 MG tablet TAKE 1 TABLET DAILY FOR BLOOD PRESSURE & FLUID RETENTION / ANKLE SWELLING   ipratropium (ATROVENT ) 0.03 % nasal spray PLACE 2 SPRAYS INTO THE NOSE 3 (THREE) TIMES DAILY.   Lancets MISC Use to check blood sugar once daily. Dx: E11.22, N18.2   levothyroxine  (SYNTHROID ) 75 MCG tablet TAKE 1 TABLET DAILY EXCEPT ON SUNDAY TAKE 1 AND 1/2 TABS ON AN EMPTY STOMACH WITH ONLY WATER FOR 30 MINUTES & NO ANTACID MEDS, CALCIUM  OR MAGNESIUM FOR 4 HOURS & AVOID BIOTIN   meloxicam (MOBIC) 15 MG tablet Take 15 mg by mouth 3 (three) times daily. PRN   methocarbamol  (ROBAXIN ) 500 MG tablet Take 1 tablet (500 mg total) by mouth every 8 (eight)  hours as needed for muscle spasms.   montelukast  (SINGULAIR ) 10 MG tablet TAKE 1 TABLET BY MOUTH DAILY FOR ALLERGIES   olmesartan  (BENICAR ) 40 MG tablet TAKE 1 TABLET DAILY FOR BLOOD PRESSURE & DIABETIC KIDNEY PROTECTION   pantoprazole  (PROTONIX ) 40 MG tablet TAKE 1 TABLET DAILY FOR HEARTBURN & INDIGESTION   rosuvastatin  (CRESTOR ) 10 MG tablet TAKE 1 TABLET BY MOUTH 3 TIMES A WEEK FOR CHOLESTEROL   Semaglutide , 2 MG/DOSE, (OZEMPIC , 2 MG/DOSE,) 8 MG/3ML SOPN Inject 2 mg into the skin once a week.   sertraline  (ZOLOFT ) 100 MG tablet TAKE 1 TABLET DAILY FOR DEPRESSION/ANXIETY   Vitamin D , Ergocalciferol , (DRISDOL ) 1.25 MG (50000 UNIT) CAPS capsule TAKE 1 CAPSULE BY MOUTH 2 DAYS A WEEK   No current facility-administered medications on file prior to visit.  ROS: all negative except what is noted in the HPI.   Physical Exam:  BP 112/76   Pulse 61   Temp 97.9 F (36.6 C)   Ht 5' 3 (1.6 m)   Wt 214 lb (97.1 kg)   SpO2 99%   BMI 37.91 kg/m   General Appearance: NAD.  Awake, conversant and cooperative. Eyes: PERRLA, EOMs intact.  Sclera white.  Conjunctiva without erythema. Sinuses: Frontal/maxillary tenderness.  No nasal discharge. Nares patent.  ENT/Mouth: Ext aud canals clear.  Bilateral TMs w/DOL and without erythema or bulging. Hearing intact.  Posterior pharynx without swelling or exudate.  Tonsils without swelling or erythema.  Neck: Supple.  No masses, nodules or thyromegaly. Respiratory: Effort is regular with non-labored breathing. Breath sounds are equal bilaterally without rales, rhonchi, wheezing or stridor.  Cardio: RRR with no MRGs. Brisk peripheral pulses without edema.  Abdomen: Active BS in all four quadrants.  Soft and non-tender without guarding, rebound tenderness, hernias or masses. Lymphatics: Non tender without lymphadenopathy.  Musculoskeletal: Full ROM, 5/5 strength, normal ambulation.  No clubbing or cyanosis. Skin: Appropriate color for ethnicity. Warm without  rashes, lesions, ecchymosis, ulcers.  Neuro: CN II-XII grossly normal. Normal muscle tone without cerebellar symptoms and intact sensation.   Psych: AO X 3,  appropriate mood and affect, insight and judgment.     BASCOM NECESSARY, NP 12:38 PM [facility]

## 2023-08-29 ENCOUNTER — Other Ambulatory Visit: Payer: Self-pay | Admitting: Nurse Practitioner

## 2023-09-25 ENCOUNTER — Other Ambulatory Visit: Payer: Self-pay | Admitting: Nurse Practitioner

## 2023-10-08 ENCOUNTER — Other Ambulatory Visit: Payer: Self-pay

## 2023-10-08 DIAGNOSIS — E611 Iron deficiency: Secondary | ICD-10-CM

## 2023-10-08 MED ORDER — FERROUS SULFATE 325 (65 FE) MG PO TABS: 325.0000 mg | ORAL_TABLET | Freq: Every day | ORAL | 0 refills | Status: DC

## 2023-10-14 ENCOUNTER — Other Ambulatory Visit: Payer: Self-pay

## 2023-10-14 MED ORDER — ROSUVASTATIN CALCIUM 10 MG PO TABS: ORAL_TABLET | ORAL | 3 refills | Status: DC

## 2023-10-16 NOTE — Progress Notes (Incomplete)
  Cardiology Office Note:  .   Date:  10/16/2023  ID:  Kristina Webb, DOB [birth year], MRN [id number] PCP: Lucky Cowboy, MD  [facility] Providers Cardiologist:  Jodelle Red, MD {  History of Present Illness: .   Kristina Webb is a 68 y.o. female with a hx of hypertension, hyperlipidemia, asthma, OSA not on CPAP who is seen for follow-up. I initially met her 05/09/2020 as a new consult at the request of Lucky Cowboy, MD for the evaluation and management of chest pressure and shortness of breath. Seen in the past by Dr. Donnie Aho.    CV history: coronary CT 2021 with Ca score 6 (51st %ile), no evidence of CAD.  CV risk -Tobacco: she is not a smoker, but both parents smoked, husband smokes -Comorbidities: hypertension, hyperlipidemia, type II diabetes, OSA not on CPAP, family history, secondhand smoke exposure -Family history: mother had heart failure, breast cancer/womb cancer, high blood pressure, diabetes, hypothyroidism, died age 70. Father had high blood pressure, died of brain tumor. Brother had rheumatic fever, has had valve issues throughout his life. Sister is 86, has had stents for CAD, had a mild stroke, has HTN, no diabetes.  Today:  ROS: Denies chest pain, shortness of breath at rest or with normal exertion. No PND, orthopnea, LE edema or unexpected weight gain. No syncope or palpitations. ROS otherwise negative except as noted.   Studies Reviewed: Marland Kitchen    EKG:       Physical Exam:   VS:  There were no vitals taken for this visit.   Wt Readings from Last 3 Encounters:  08/28/23 214 lb (97.1 kg)  07/28/23 210 lb 9.6 oz (95.5 kg)  05/29/23 216 lb 3.2 oz (98.1 kg)    GEN: Well nourished, well developed in no acute distress HEENT: Normal, moist mucous membranes NECK: No JVD CARDIAC: regular rhythm, normal S1 and S2, no rubs or gallops. No murmur. VASCULAR: Radial and DP pulses 2+ bilaterally. No carotid bruits RESPIRATORY:  Clear to auscultation without  rales, wheezing or rhonchi  ABDOMEN: Soft, non-tender, non-distended MUSCULOSKELETAL:  Ambulates independently SKIN: Warm and dry, no edema NEUROLOGIC:  Alert and oriented x 3. No focal neuro deficits noted. PSYCHIATRIC:  Normal affect    ASSESSMENT AND PLAN: .    Coronary calcification: trivial, no obstruction. Prevention as below -continue aspirin, rosuvastatin   Hypertension:  -running low today -discussed checking home Bps. If remains low   Hyperlipidemia: -continue rosuvastatin -per [facility], lipids 10/09/22: Tchol 150, HDL 61, LDL 68, TG 133   Type II diabetes, not on insulin Obesity, BMI 38 -currently on semaglutide -aspirin, statin as above  CV risk counseling and prevention -recommend heart healthy/Mediterranean diet, with whole grains, fruits, vegetable, fish, lean meats, nuts, and olive oil. Limit salt. -recommend moderate walking, 3-5 times/week for 30-50 minutes each session. Aim for at least 150 minutes.week. Goal should be pace of 3 miles/hours, or walking 1.5 miles in 30 minutes -recommend avoidance of tobacco products. Avoid excess alcohol.  Dispo: 1 year or sooner as needed  Signed, Jodelle Red, MD   Jodelle Red, MD, PhD, [facility]  [facility]  [facility] at [facility] at [facility] [address], [address] [place], [place] [address] [phone]

## 2023-10-17 ENCOUNTER — Ambulatory Visit (HOSPITAL_BASED_OUTPATIENT_CLINIC_OR_DEPARTMENT_OTHER): Payer: PPO | Admitting: Cardiology

## 2023-10-29 ENCOUNTER — Telehealth: Payer: Self-pay

## 2023-10-29 NOTE — Telephone Encounter (Signed)
 Patient contacted and informed that she needs to pick up her Ozempic Patient Assistance shipment today. Patient agreed and verbalized clear understanding to this staff member.

## 2023-10-30 ENCOUNTER — Ambulatory Visit: Payer: PPO | Admitting: Internal Medicine

## 2023-11-07 ENCOUNTER — Encounter (HOSPITAL_BASED_OUTPATIENT_CLINIC_OR_DEPARTMENT_OTHER): Payer: Self-pay | Admitting: Family

## 2023-11-07 ENCOUNTER — Ambulatory Visit (HOSPITAL_BASED_OUTPATIENT_CLINIC_OR_DEPARTMENT_OTHER): Admitting: Family

## 2023-11-07 VITALS — BP 106/72 | HR 58 | Ht 63.0 in | Wt 221.0 lb

## 2023-11-07 DIAGNOSIS — E785 Hyperlipidemia, unspecified: Secondary | ICD-10-CM

## 2023-11-07 DIAGNOSIS — R001 Bradycardia, unspecified: Secondary | ICD-10-CM | POA: Diagnosis not present

## 2023-11-07 DIAGNOSIS — I10 Essential (primary) hypertension: Secondary | ICD-10-CM | POA: Diagnosis not present

## 2023-11-07 DIAGNOSIS — E119 Type 2 diabetes mellitus without complications: Secondary | ICD-10-CM | POA: Diagnosis not present

## 2023-11-07 DIAGNOSIS — R0609 Other forms of dyspnea: Secondary | ICD-10-CM

## 2023-11-07 DIAGNOSIS — I25118 Atherosclerotic heart disease of native coronary artery with other forms of angina pectoris: Secondary | ICD-10-CM

## 2023-11-07 MED ORDER — OLMESARTAN MEDOXOMIL 20 MG PO TABS: 20.0000 mg | ORAL_TABLET | Freq: Every day | ORAL | Status: DC

## 2023-11-07 MED ORDER — OZEMPIC (0.25 OR 0.5 MG/DOSE) 2 MG/1.5ML ~~LOC~~ SOPN: PEN_INJECTOR | SUBCUTANEOUS | 0 refills | Status: DC

## 2023-11-07 NOTE — Progress Notes (Signed)
 Cardiology Office Note:  .   Date:  11/09/2023  ID:  Kristina Webb, DOB 11-07-1955, MRN [id number] PCP: Kristina Meo, FNP  [facility] Providers Cardiologist:  Kristina Red, MD    History of Present Illness: .   Kristina Webb is a 68 y.o. female with history of HTN, HLD, asthma, OSA not on CPAP, nonobstructive CAD.  Family history notable for heart failure, HTN in her mom and HTN in her father. Her sister has cardiovascular disease including CAD and stroke. Established with Dr. Cristal Webb 2021 due to chest pain, dyspnea. Cardiac CTA 05/2020 calcium score of 6 placing her in 51st percentile.   Presents today for follow up independently. Since last seen her husband has had prolonged hospitalization for heart failure and now is doing some better. This led to inconsistent eating patterns and being less active. Reports difficulty sleeping, feeling generally fatigued. Also notes dyspnea with household activities which she attributes to gaining weight. Had nausea and vomiting with Ozempic and has not taken in some time. Discussed Mounjaro vs Ozempic, she is willing to trial lower dose Ozempic.  Reports no chest pain, pressure, or tightness. No orthopnea, PND. Reports no palpitations.    ROS: Please see the history of present illness.    All other systems reviewed and are negative.   Studies Reviewed: Marland Kitchen   EKG Interpretation Date/Time:  Friday November 07 2023 14:55:16 EDT Ventricular Rate:  58 PR Interval:  190 QRS Duration:  112 QT Interval:  432 QTC Calculation: 424 R Axis:   60  Text Interpretation: Sinus bradycardia When compared with ECG of 26-Jan-2018 04:35, No significant change was found Confirmed by Gillian Webb ([id number]) on 11/07/2023 3:04:00 PM    Cardiac Studies & Procedures   ______________________________________________________________________________________________          CT SCANS  CT CORONARY MORPH W/CTA COR W/SCORE 05/26/2020  Addendum 05/26/2020   5:32 PM ADDENDUM REPORT: 05/26/2020 17:30  HISTORY: Chest pain, nonspecific  EXAM: Cardiac/Coronary CT  TECHNIQUE: The patient was scanned on a [employer].  PROTOCOL: A 120 kV prospective scan was triggered in the descending thoracic aorta at 111 HU's. Axial non-contrast 3 mm slices were carried out through the heart. The data set was analyzed on a dedicated work station and scored using the Agatson method. Gantry rotation speed was 250 msecs and collimation was 0.6 mm. Beta blockade and 0.8 mg of sl NTG was given. The 3D data set was reconstructed in 5% intervals of 35-75% of the R-R cycle. Diastolic phases were analyzed on a dedicated work station using MPR, MIP and VRT modes. The patient received 80mL OMNIPAQUE IOHEXOL 350 MG/ML SOLN of contrast.  FINDINGS: Coronary calcium score: The patient's coronary artery calcium score is 6, which places the patient in the 51 percentile.  Coronary arteries: Normal coronary origins.  Left dominance.  Right Coronary Artery: Small caliber, nondominant vessel. No significant plaque or stenosis.  Left Main Coronary Artery: Normal caliber vessel. No significant plaque or stenosis. Small ramus intermedius without significant plaque or stenosis.  Left Anterior Descending Coronary Artery: Normal caliber vessel. Small focus of calcified plaque in the proximal LAD without any stenosis. Gives rise to 1 small diagonal branch.  Left Circumflex Artery: Normal caliber vessel. No significant plaque or stenosis. Gives rise to 1 large OM branch.  Aorta: Normal size, 30 mm at the mid ascending aorta (level of the PA bifurcation) measured double oblique. No calcifications. No dissection.  Aortic Valve: No calcifications. Trileaflet.  Other findings:  Normal pulmonary vein drainage into the left atrium.  Normal left atrial appendage without a thrombus.  Normal size of the pulmonary artery.  IMPRESSION: 1. No evidence of CAD,  CADRADS = 0. There is a single focus of calcium in the proximal LAD, but it does not obstruct the lumen at all.  2. Coronary calcium score of 6. This was 39 percentile for age and sex matched control.  3. Normal coronary origin with left dominance.   Electronically Signed By: Kristina Webb M.D. On: 05/26/2020 17:30  Narrative EXAM: OVER-READ INTERPRETATION  CT CHEST  The following report is an over-read performed by radiologist Dr. Charlett Webb of [facility] on 05/26/2020. This over-read does not include interpretation of cardiac or coronary anatomy or pathology. The coronary CTA interpretation by the cardiologist is attached.  COMPARISON:  None.  FINDINGS: Vascular: Heart is normal size.  Aorta normal caliber.  Mediastinum/Nodes: No adenopathy.  Lungs/Pleura: No confluent opacities or effusions.  Upper Abdomen: Imaging into the upper abdomen demonstrates no acute findings.  Musculoskeletal: Chest wall soft tissues are unremarkable. No acute bony abnormality.  IMPRESSION: No acute or significant extracardiac abnormality.  Electronically Signed: By: Kristina Webb M.D. On: 05/26/2020 12:21     ______________________________________________________________________________________________      Risk Assessment/Calculations:            Physical Exam:   VS:  BP 106/72   Pulse (!) 58   Ht 5\' 3"  (1.6 m)   Wt 221 lb (100.2 kg)   SpO2 94%   BMI 39.15 kg/m    Wt Readings from Last 3 Encounters:  11/07/23 221 lb (100.2 kg)  08/28/23 214 lb (97.1 kg)  07/28/23 210 lb 9.6 oz (95.5 kg)    GEN: Well nourished, well developed in no acute distress NECK: No JVD; No carotid bruits CARDIAC: RRR, no murmurs, rubs, gallops RESPIRATORY:  Clear to auscultation without rales, wheezing or rhonchi  ABDOMEN: Soft, non-tender, non-distended EXTREMITIES:  No edema; No deformity   ASSESSMENT AND PLAN: .    DOE - DOE likely related to weight gain and  decrease in activity in the setting of her spouse's prolonged illness. However, given significant DOE plan for echocardiogram.  Coronary calcification on CT / HLD, LDL goal <70 - Stable with no anginal symptoms. No indication for ischemic evaluation.  GDMT Rosuvastatin 10mg  daily, Bisoprolol 10mg  daily, Aspirin EC 81mg  daily. Recommend aiming for 150 minutes of moderate intensity activity per week and following a heart healthy diet.   HTN - Relatively hypotensive with low energy level. Reduce Olmesartan from 40mg  to 20mg  daily. Continue Bisoprolol 10mg  daily, Lasix 40mg  daily. Discussed to monitor BP at home at least 2 hours after medications and sitting for 5-10 minutes.   DM2 - Has not taken Ozempic in some time due to nausea/vomiting with 2mg  dose. Previously benefitted from GLP1 with weight loss and glucose control. Has upcoming visit with PCP in a couple weeks, anticipate lab work at that time. Discussed trial of Mounjaro vs resuming Ozempic at lower dose. She prefers to resume Ozempic at lower dose, sample 0.25mg  pen provided. She will take 0.25mg  weekly x 4 weeks and follow up with PCP for next steps.        Dispo: follow up in 2-3 months  Signed, Alver Sorrow, NP

## 2023-11-07 NOTE — Patient Instructions (Addendum)
 Medication Instructions:   Recommend trying Melatonin about 30 minutes prior to bed time to help with sleep.   REDUCE Olmesartan to 20mg  (half tablet) daily  *If you need a refill on your cardiac medications before your next appointment, please call your pharmacy*  Tests/Procedures  Your physician has requested that you have an echocardiogram. Echocardiography is a painless test that uses sound waves to create images of your heart. It provides your doctor with information about the size and shape of your heart and how well your heart's chambers and valves are working. This procedure takes approximately one hour. There are no restrictions for this procedure. Please do NOT wear cologne, perfume, aftershave, or lotions (deodorant is allowed). Please arrive 15 minutes prior to your appointment time.  Please note: We ask at that you not bring children with you during ultrasound (echo/ vascular) testing. Due to room size and safety concerns, children are not allowed in the ultrasound rooms during exams. Our front office staff cannot provide observation of children in our lobby area while testing is being conducted. An adult accompanying a patient to their appointment will only be allowed in the ultrasound room at the discretion of the ultrasound technician under special circumstances. We apologize for any inconvenience.   Follow-Up: At [facility], you and your health needs are our priority.  As part of our continuing mission to provide you with exceptional heart care, we have created designated Provider Care Teams.  These Care Teams include your primary Cardiologist (physician) and Advanced Practice Providers (APPs -  Physician Assistants and Nurse Practitioners) who all work together to provide you with the care you need, when you need it.  We recommend signing up for the patient portal called "MyChart".  Sign up information is provided on this After Visit Summary.  MyChart is used to  connect with patients for Virtual Visits (Telemedicine).  Patients are able to view lab/test results, encounter notes, upcoming appointments, etc.  Non-urgent messages can be sent to your provider as well.   To learn more about what you can do with MyChart, go to [email or web address].    Your next appointment:   2-3 months with Dr. Cristal Deer, Alver Sorrow, NP, or Eligha Bridegroom, NP

## 2023-11-18 ENCOUNTER — Ambulatory Visit (HOSPITAL_BASED_OUTPATIENT_CLINIC_OR_DEPARTMENT_OTHER): Payer: PPO | Admitting: Family

## 2023-11-28 ENCOUNTER — Ambulatory Visit (INDEPENDENT_AMBULATORY_CARE_PROVIDER_SITE_OTHER)

## 2023-11-28 DIAGNOSIS — R0609 Other forms of dyspnea: Secondary | ICD-10-CM | POA: Diagnosis not present

## 2023-11-28 DIAGNOSIS — I25118 Atherosclerotic heart disease of native coronary artery with other forms of angina pectoris: Secondary | ICD-10-CM

## 2023-11-28 LAB — ECHOCARDIOGRAM COMPLETE
AR max vel: 2.06 cm2
AV Area VTI: 2.2 cm2
AV Area mean vel: 1.99 cm2
AV Mean grad: 4 mmHg
AV Peak grad: 8.5 mmHg
Ao pk vel: 1.46 m/s
Area-P 1/2: 3.11 cm2
S' Lateral: 2.65 cm

## 2023-11-28 MED ORDER — PERFLUTREN LIPID MICROSPHERE
1.0000 mL | INTRAVENOUS | Status: AC | PRN
Start: 2023-11-28 — End: 2023-11-28
  Administered 2023-11-28: 5 mL via INTRAVENOUS

## 2023-11-29 ENCOUNTER — Encounter: Payer: Self-pay | Admitting: Family Medicine

## 2023-12-01 ENCOUNTER — Other Ambulatory Visit: Payer: Self-pay | Admitting: Family Medicine

## 2023-12-01 MED ORDER — PANTOPRAZOLE SODIUM 40 MG PO TBEC: DELAYED_RELEASE_TABLET | ORAL | 0 refills | Status: DC

## 2023-12-02 ENCOUNTER — Encounter (HOSPITAL_BASED_OUTPATIENT_CLINIC_OR_DEPARTMENT_OTHER): Payer: Self-pay

## 2023-12-05 ENCOUNTER — Encounter (HOSPITAL_BASED_OUTPATIENT_CLINIC_OR_DEPARTMENT_OTHER): Payer: Self-pay

## 2023-12-17 ENCOUNTER — Ambulatory Visit: Payer: PPO | Admitting: Family Medicine

## 2023-12-17 VITALS — BP 126/74 | HR 78 | Resp 16 | Ht 63.5 in | Wt 226.0 lb

## 2023-12-17 DIAGNOSIS — F324 Major depressive disorder, single episode, in partial remission: Secondary | ICD-10-CM | POA: Diagnosis not present

## 2023-12-17 DIAGNOSIS — E1122 Type 2 diabetes mellitus with diabetic chronic kidney disease: Secondary | ICD-10-CM | POA: Diagnosis not present

## 2023-12-17 DIAGNOSIS — I10 Essential (primary) hypertension: Secondary | ICD-10-CM | POA: Diagnosis not present

## 2023-12-17 DIAGNOSIS — E1169 Type 2 diabetes mellitus with other specified complication: Secondary | ICD-10-CM | POA: Diagnosis not present

## 2023-12-17 DIAGNOSIS — Z7985 Type 2 diabetes mellitus without complications: Secondary | ICD-10-CM | POA: Insufficient documentation

## 2023-12-17 DIAGNOSIS — I25118 Atherosclerotic heart disease of native coronary artery with other forms of angina pectoris: Secondary | ICD-10-CM | POA: Insufficient documentation

## 2023-12-17 DIAGNOSIS — E611 Iron deficiency: Secondary | ICD-10-CM

## 2023-12-17 DIAGNOSIS — E785 Hyperlipidemia, unspecified: Secondary | ICD-10-CM

## 2023-12-17 DIAGNOSIS — N183 Chronic kidney disease, stage 3 unspecified: Secondary | ICD-10-CM

## 2023-12-17 DIAGNOSIS — K2100 Gastro-esophageal reflux disease with esophagitis, without bleeding: Secondary | ICD-10-CM

## 2023-12-17 DIAGNOSIS — E119 Type 2 diabetes mellitus without complications: Secondary | ICD-10-CM | POA: Insufficient documentation

## 2023-12-17 DIAGNOSIS — Z13220 Encounter for screening for lipoid disorders: Secondary | ICD-10-CM

## 2023-12-17 DIAGNOSIS — E559 Vitamin D deficiency, unspecified: Secondary | ICD-10-CM

## 2023-12-17 MED ORDER — SERTRALINE HCL 25 MG PO TABS
25.0000 mg | ORAL_TABLET | Freq: Every day | ORAL | 3 refills | Status: DC
Start: 2023-12-17 — End: 2024-01-12

## 2023-12-17 NOTE — Assessment & Plan Note (Signed)
 Chronic uncontrolled. Stressors included her husbands health and certainly aspects of caregiver fatigue with neglect in self-care. Would like increase in Zoloft  to 125mg  daily. Denies SI/HI. Encouraged therapy however time prohibits. Encouraged taking time for self care and continuing daily walks with her friends for overall health, proper nutrition, adequate sleep.

## 2023-12-17 NOTE — Assessment & Plan Note (Signed)
 Non-fasting labs today. Continue Ozempic  2mg  weekly. Referral placed for pharmacy assistance for cost. Previously enrolled in government assistance program. A1c and uACR today. Foot exam utd. Vaccines utd. Retinal eye exam utd. Recommend heart healthy diet such as Mediterranean diet with whole grains, fruits, vegetable, fish, lean meats, nuts, and olive oil. Limit salt. Encouraged moderate walking, 3-5 times/week for 30-50 minutes each session. Aim for at least 150 minutes.week. Goal should be pace of 3 miles/hours, or walking 1.5 miles in 30 minutes. Seek medical care for urinary frequency, extreme thirst, vision changes, lightheadedness, dizziness.

## 2023-12-17 NOTE — Assessment & Plan Note (Signed)
 CBC today. Is not taking iron currently.

## 2023-12-17 NOTE — Assessment & Plan Note (Signed)
 Stay hydrated, avoid NSAIDs and salt. Taking Olmesartan . Labs today.

## 2023-12-17 NOTE — Progress Notes (Signed)
 New Patient Office Visit  Subjective    Patient ID: Kristina Webb, female    DOB: 30-Nov-1955  Age: 68 y.o. MRN: [id number]  CC:  Chief Complaint  Patient presents with   Establish Care    New pt appt     HPI Kristina Webb presents to establish care. Oriented to practice routines and expectations. PMH as below. She does see OBGYN, [facility], Cardiology.  Breast CA screening: Mammogram status: Completed 2025. Repeat every year Colon CA screening: colonoscopy 6 years ago  precancerous polyps  abnormalities. DEXA: completed 2023 Tobacco: non-smoker STI: declines Vaccines:  UTD , has shingles virus 2 years ago  HYPERTENSION / HYPERLIPIDEMIA Satisfied with current treatment? yes Duration of hypertension: chronic BP monitoring frequency: daily BP range: 120/70s BP medication side effects: no Past BP meds:  bisoprolol , olmesartan , lasix  Duration of hyperlipidemia: chronic Cholesterol medication side effects: no Cholesterol supplements: none Past cholesterol medications: rosuvastatin  Medication compliance: excellent compliance Aspirin : yes Recent stressors: yes Recurrent headaches: no Visual changes: no Palpitations: no Dyspnea: no Chest pain: no Lower extremity edema: no Dizzy/lightheaded: no  DIABETES Hypoglycemic episodes:no Polydipsia/polyuria: no Visual disturbance: no Chest pain: no Paresthesias: no Glucose Monitoring: no  Accucheck frequency: Not Checking  Fasting glucose:  Post prandial:  Evening:  Before meals: Taking Insulin ?: no  Long acting insulin :  Short acting insulin : Blood Pressure Monitoring: daily Retinal Examination: Up to Date Foot Exam: Up to Date Diabetic Education: Completed Pneumovax: Up to Date Influenza: Up to Date Aspirin : yes  CHRONIC KIDNEY DISEASE CKD status: stable Medications renally dose: yes Previous renal evaluation: yes Pneumovax:  Up to Date Influenza Vaccine:  Up to Date  HYPOTHYROIDISM Thyroid   control status:controlled Satisfied with current treatment? yes Medication side effects: no Medication compliance: excellent compliance Etiology of hypothyroidism:  Recent dose adjustment:no Fatigue: no Cold intolerance: no Heat intolerance: no Weight gain: no Weight loss: no Constipation: no Diarrhea/loose stools: no Palpitations: no Lower extremity edema: no Anxiety/depressed mood: yes     12/17/2023    2:05 PM  GAD 7 : Generalized Anxiety Score  Nervous, Anxious, on Edge 0  Control/stop worrying 0  Worry too much - different things 0  Trouble relaxing 0  Restless 0  Easily annoyed or irritable 0  Afraid - awful might happen 0  Total GAD 7 Score 0  Anxiety Difficulty Not difficult at all       12/17/2023    2:05 PM 07/28/2023    4:13 PM 07/09/2022    2:33 PM 02/28/2022    3:14 PM 01/25/2021    3:26 PM  Depression screen PHQ 2/9  Decreased Interest 0 0 0 0 1  Down, Depressed, Hopeless 0 0 0 1 2  PHQ - 2 Score 0 0 0 1 3  Altered sleeping     0  Tired, decreased energy     1  Change in appetite     1  Feeling bad or failure about yourself      0  Trouble concentrating     0  Moving slowly or fidgety/restless     1  Suicidal thoughts     0  PHQ-9 Score     6  Difficult doing work/chores     Somewhat difficult       Outpatient Encounter Medications as of 12/17/2023  Medication Sig   acetaminophen  (TYLENOL ) 500 MG tablet Take 1,000 mg by mouth every 6 (six) hours as needed for mild pain, moderate pain or headache.  albuterol  (VENTOLIN  HFA) 108 (90 Base) MCG/ACT inhaler Inhale 2 puffs into the lungs every 4 (four) hours as needed for wheezing or shortness of breath.   amitriptyline  (ELAVIL ) 10 MG tablet Take 1 tablet (10 mg total) by mouth at bedtime as needed.   aspirin  EC 81 MG tablet Take 1 tablet (81 mg total) by mouth daily. Swallow whole.   bisoprolol  (ZEBETA ) 10 MG tablet TAKE 1 TABLET BY MOUTH EVERY DAY FOR BLOOD PRESSURE   Budeson-Glycopyrrol-Formoterol   (BREZTRI  AEROSPHERE) 160-9-4.8 MCG/ACT AERO Inhale 2 puffs into the lungs in the morning and at bedtime. Rinse mouth/gargle after each use.   clotrimazole -betamethasone  (LOTRISONE ) cream Apply 1 Application topically 2 (two) times daily.   estradiol  (ESTRACE ) 0.1 MG/GM vaginal cream as needed.   ferrous sulfate  325 (65 FE) MG tablet Take 1 tablet (325 mg total) by mouth daily.   fluticasone -salmeterol (ADVAIR DISKUS) 100-50 MCG/ACT AEPB Inhale into the lungs.   FREESTYLE LITE test strip USE TO CHECK BLOOD SUGAR ONCE DAILY. DX: E11.22, N18.2   furosemide  (LASIX ) 40 MG tablet TAKE 1 TABLET DAILY FOR BLOOD PRESSURE & FLUID RETENTION / ANKLE SWELLING   ipratropium (ATROVENT ) 0.03 % nasal spray PLACE 2 SPRAYS INTO THE NOSE 3 (THREE) TIMES DAILY.   Lancets MISC Use to check blood sugar once daily. Dx: E11.22, N18.2   levothyroxine  (SYNTHROID ) 75 MCG tablet TAKE 1 TABLET DAILY EXCEPT ON SUNDAY TAKE 1 AND 1/2 TABS ON AN EMPTY STOMACH WITH ONLY WATER FOR 30 MINUTES & NO ANTACID MEDS, CALCIUM  OR MAGNESIUM FOR 4 HOURS & AVOID BIOTIN   methocarbamol  (ROBAXIN ) 500 MG tablet Take 1 tablet (500 mg total) by mouth every 8 (eight) hours as needed for muscle spasms.   montelukast  (SINGULAIR ) 10 MG tablet TAKE 1 TABLET BY MOUTH DAILY FOR ALLERGIES   olmesartan  (BENICAR ) 20 MG tablet Take 1 tablet (20 mg total) by mouth daily. TAKE 1 TABLET DAILY FOR BLOOD PRESSURE & DIABETIC KIDNEY PROTECTION   pantoprazole  (PROTONIX ) 40 MG tablet Take 1 tablet Daily for Heartburn & Indigestion   rosuvastatin  (CRESTOR ) 10 MG tablet TAKE 1 TABLET BY MOUTH 3 TIMES A WEEK FOR CHOLESTEROL   Semaglutide ,0.25 or 0.5MG /DOS, (OZEMPIC , 0.25 OR 0.5 MG/DOSE,) 2 MG/1.5ML SOPN INJECT 0.25 MG WEEKLY FOR 4 WEEKS THEN INCREASE TO 0.5 MG WEEKLY FOR 4 WEEKS   sertraline  (ZOLOFT ) 100 MG tablet TAKE 1 TABLET DAILY FOR DEPRESSION/ANXIETY   sertraline  (ZOLOFT ) 25 MG tablet Take 1 tablet (25 mg total) by mouth daily.   Vitamin D , Ergocalciferol , (DRISDOL )  1.25 MG (50000 UNIT) CAPS capsule TAKE 1 CAPSULE BY MOUTH 2 DAYS A WEEK   [DISCONTINUED] meloxicam (MOBIC) 15 MG tablet Take 15 mg by mouth 3 (three) times daily. PRN   [DISCONTINUED] azithromycin  (ZITHROMAX ) 250 MG tablet Take 2 tablets on  Day 1,  followed by 1 tablet  daily for 4 more days    for Sinusitis  /Bronchitis   [DISCONTINUED] benzonatate  (TESSALON ) 200 MG capsule Take 1 cap 3 times a day as needed for cough.   [DISCONTINUED] promethazine -dextromethorphan (PROMETHAZINE -DM) 6.25-15 MG/5ML syrup Take 5 mLs by mouth 4 (four) times daily as needed for cough.   No facility-administered encounter medications on file as of 12/17/2023.    Past Medical History:  Diagnosis Date   Anemia    hx   Anxiety    Arthritis    back   Asthma    Depression    Deviated septum    GERD (gastroesophageal reflux disease)    Headache  History of hiatal hernia    Hyperlipemia    Hypertension    Hypothyroidism    OSA (obstructive sleep apnea)    does not use -last 6 months   Pre-diabetes    per pt    Past Surgical History:  Procedure Laterality Date   ANKLE GANGLION CYST EXCISION Left    BACK SURGERY     L3-4 decompression   birth mark treatments     BREAST LUMPECTOMY WITH RADIOACTIVE SEED LOCALIZATION Left 02/02/2016   Procedure: LEFT BREAST LUMPECTOMY WITH RADIOACTIVE SEED LOCALIZATION;  Surgeon: Lillette Reid III, MD;  Location: [facility] OR;  Service: General;  Laterality: Left;   BREAST SURGERY Right    papilloma removed,cyst lft   CATARACT EXTRACTION Right 12/2016   Dr. Hobart Lulas   CHOLECYSTECTOMY N/A 02/20/2017   Procedure: LAPAROSCOPIC CHOLECYSTECTOMY WITH INTRAOPERATIVE CHOLANGIOGRAM;  Surgeon: Caralyn Chandler, MD;  Location: [facility] OR;  Service: General;  Laterality: N/A;   COLON SURGERY  2008   resection- endometriosis    Family History  Problem Relation Age of Onset   Emphysema Mother        smoked   Allergies Mother    Asthma Mother    Heart disease Mother    Breast cancer Mother         with mets to Bone   Prostate cancer Father    Brain cancer Father    Hypertension Brother    Thyroid  disease Brother    Rheumatic fever Brother    Heart disease Brother    Hypertension Brother    Colon cancer Paternal Grandmother    Transient ischemic attack Sister    Heart disease Sister    Allergic rhinitis Neg Hx    Angioedema Neg Hx    Eczema Neg Hx    Immunodeficiency Neg Hx    Urticaria Neg Hx     Social History   Socioeconomic History   Marital status: Married    Spouse name: Not on file   Number of children: 0   Years of education: Not on file   Highest education level: 12th grade  Occupational History   Occupation: retired  Tobacco Use   Smoking status: Never   Smokeless tobacco: Never   Tobacco comments:    husband smokes in the house  Vaping Use   Vaping status: Never Used  Substance and Sexual Activity   Alcohol use: No   Drug use: No   Sexual activity: Yes    Partners: Male    Birth control/protection: None, Post-menopausal  Other Topics Concern   Not on file  Social History Narrative   Drinks about 1/2 can soda daily.   Social Drivers of Health   Financial Resource Strain: High Risk (12/17/2023)   Overall Financial Resource Strain (CARDIA)    Difficulty of Paying Living Expenses: Hard  Food Insecurity: No Food Insecurity (12/17/2023)   Hunger Vital Sign    Worried About Running Out of Food in the Last Year: Never true    Ran Out of Food in the Last Year: Never true  Transportation Needs: No Transportation Needs (12/17/2023)   PRAPARE - Administrator, Civil Service (Medical): No    Lack of Transportation (Non-Medical): No  Physical Activity: Insufficiently Active (12/17/2023)   Exercise Vital Sign    Days of Exercise per Week: 3 days    Minutes of Exercise per Session: 30 min  Stress: Stress Concern Present (12/17/2023)   [employer] of Occupational Health - Occupational Stress  Questionnaire    Feeling of Stress : To  some extent  Social Connections: Moderately Integrated (12/17/2023)   Social Connection and Isolation Panel [NHANES]    Frequency of Communication with Friends and Family: More than three times a week    Frequency of Social Gatherings with Friends and Family: Three times a week    Attends Religious Services: More than 4 times per year    Active Member of Clubs or Organizations: No    Attends Banker Meetings: Not on file    Marital Status: Married  Intimate Partner Violence: Not At Risk (12/17/2023)   Humiliation, Afraid, Rape, and Kick questionnaire    Fear of Current or Ex-Partner: No    Emotionally Abused: No    Physically Abused: No    Sexually Abused: No    Review of Systems  All other systems reviewed and are negative.       Objective    BP 126/74   Pulse 78   Resp 16   Ht 5' 3.5" (1.613 m)   Wt 226 lb 0.3 oz (102.5 kg)   SpO2 97%   BMI 39.41 kg/m   Physical Exam Vitals and nursing note reviewed.  Constitutional:      Appearance: Normal appearance. She is normal weight.  HENT:     Head: Normocephalic and atraumatic.  Cardiovascular:     Rate and Rhythm: Normal rate and regular rhythm.     Pulses: Normal pulses.     Heart sounds: Normal heart sounds.  Pulmonary:     Effort: Pulmonary effort is normal.     Breath sounds: Normal breath sounds.  Skin:    General: Skin is warm and dry.  Neurological:     General: No focal deficit present.     Mental Status: She is alert and oriented to person, place, and time. Mental status is at baseline.  Psychiatric:        Mood and Affect: Mood normal.        Behavior: Behavior normal.        Thought Content: Thought content normal.        Judgment: Judgment normal.         Assessment & Plan:   Problem List Items Addressed This Visit     Hypertension   Chronic well controlled. Continue Olmesartan , Lasix , Bisoprolol . Non-fasting labs today. Recommend heart healthy diet such as Mediterranean diet with  whole grains, fruits, vegetable, fish, lean meats, nuts, and olive oil. Limit salt. Encouraged moderate walking, 3-5 times/week for 30-50 minutes each session. Aim for at least 150 minutes.week. Goal should be pace of 3 miles/hours, or walking 1.5 miles in 30 minutes. Avoid tobacco products. Avoid excess alcohol. Take medications as prescribed and bring medications and blood pressure log with cuff to each office visit. Seek medical care for chest pain, palpitations, shortness of breath with exertion, dizziness/lightheadedness, vision changes, recurrent headaches, or swelling of extremities. Follow up in 4 months for CPE or sooner if needed.      Relevant Orders   CBC with Differential/Platelet   Comprehensive metabolic panel with GFR   Lipid panel   GERD (gastroesophageal reflux disease)   Chronic well controlled on Protonix  40mg  daily. Elevated HOB if needed and avoid lying down 2-3 hours after eating, avoid coffee, alcohol, chocolate, fatty foods, citrus, carbonated beverages, spicy foods, late meals, and smoking. Return to office if symptoms return or worsen and seek medical care for difficulty swallowing, bleeding, anemia, weight loss, recurrent vomiting  Major depression in partial remission (HCC)   Chronic uncontrolled. Stressors included her husbands health and certainly aspects of caregiver fatigue with neglect in self-care. Would like increase in Zoloft  to 125mg  daily. Denies SI/HI. Encouraged therapy however time prohibits. Encouraged taking time for self care and continuing daily walks with her friends for overall health, proper nutrition, adequate sleep.      Relevant Medications   sertraline  (ZOLOFT ) 25 MG tablet   Other Relevant Orders   VITAMIN D  25 Hydroxy (Vit-D Deficiency, Fractures)   Hyperlipidemia associated with type 2 diabetes mellitus (HCC)   Non fasting labs today. Continue rosuvastatin  10mg  daily. LDL goal <70. I recommend consuming a heart healthy diet such as  Mediterranean diet or DASH diet with whole grains, fruits, vegetable, fish, lean meats, nuts, and olive oil. Limit sweets and processed foods. I also encourage moderate intensity exercise 150 minutes weekly. This is 3-5 times weekly for 30-50 minutes each session. Goal should be pace of 3 miles/hours, or walking 1.5 miles in 30 minutes. The 10-year ASCVD risk score (Arnett DK, et al., 2019) is: 12.5%       Relevant Orders   Lipid panel   CKD stage 3 due to type 2 diabetes mellitus (HCC)   Stay hydrated, avoid NSAIDs and salt. Taking Olmesartan . Labs today.      Relevant Orders   CBC with Differential/Platelet   Comprehensive metabolic panel with GFR   Lipid panel   Iron deficiency   CBC today. Is not taking iron currently.      Relevant Orders   CBC with Differential/Platelet   Diabetes mellitus treated with injections of non-insulin  medication (HCC) - Primary   Non-fasting labs today. Continue Ozempic  2mg  weekly. Referral placed for pharmacy assistance for cost. Previously enrolled in government assistance program. A1c and uACR today. Foot exam utd. Vaccines utd. Retinal eye exam utd. Recommend heart healthy diet such as Mediterranean diet with whole grains, fruits, vegetable, fish, lean meats, nuts, and olive oil. Limit salt. Encouraged moderate walking, 3-5 times/week for 30-50 minutes each session. Aim for at least 150 minutes.week. Goal should be pace of 3 miles/hours, or walking 1.5 miles in 30 minutes. Seek medical care for urinary frequency, extreme thirst, vision changes, lightheadedness, dizziness.        Relevant Orders   Lipid panel   TSH   Hemoglobin A1c   Microalbumin / creatinine urine ratio   AMB Referral [facility] Care Management   Other Visit Diagnoses       Screening for lipoid disorders       Relevant Orders   Lipid panel     Vitamin D  deficiency       Relevant Orders   VITAMIN D  25 Hydroxy (Vit-D Deficiency, Fractures)       Return in about 4 months  (around 04/17/2024) for annual physical with labs 1 week prior.   Jenelle Mis, FNP

## 2023-12-17 NOTE — Patient Instructions (Signed)
 It was great to meet you today and I'm excited to have you join the [facility] practice. I hope you had a positive experience today! If you feel so inclined, please feel free to recommend our practice to friends and family. Kurtis Bushman, FNP-C

## 2023-12-17 NOTE — Assessment & Plan Note (Signed)
 Non fasting labs today. Continue rosuvastatin  10mg  daily. LDL goal <70. I recommend consuming a heart healthy diet such as Mediterranean diet or DASH diet with whole grains, fruits, vegetable, fish, lean meats, nuts, and olive oil. Limit sweets and processed foods. I also encourage moderate intensity exercise 150 minutes weekly. This is 3-5 times weekly for 30-50 minutes each session. Goal should be pace of 3 miles/hours, or walking 1.5 miles in 30 minutes. The 10-year ASCVD risk score (Arnett DK, et al., 2019) is: 12.5%

## 2023-12-17 NOTE — Assessment & Plan Note (Signed)
 Chronic well controlled. Continue Olmesartan , Lasix , Bisoprolol . Non-fasting labs today. Recommend heart healthy diet such as Mediterranean diet with whole grains, fruits, vegetable, fish, lean meats, nuts, and olive oil. Limit salt. Encouraged moderate walking, 3-5 times/week for 30-50 minutes each session. Aim for at least 150 minutes.week. Goal should be pace of 3 miles/hours, or walking 1.5 miles in 30 minutes. Avoid tobacco products. Avoid excess alcohol. Take medications as prescribed and bring medications and blood pressure log with cuff to each office visit. Seek medical care for chest pain, palpitations, shortness of breath with exertion, dizziness/lightheadedness, vision changes, recurrent headaches, or swelling of extremities. Follow up in 4 months for CPE or sooner if needed.

## 2023-12-17 NOTE — Assessment & Plan Note (Signed)
 Chronic well controlled on Protonix  40mg  daily. Elevated HOB if needed and avoid lying down 2-3 hours after eating, avoid coffee, alcohol, chocolate, fatty foods, citrus, carbonated beverages, spicy foods, late meals, and smoking. Return to office if symptoms return or worsen and seek medical care for difficulty swallowing, bleeding, anemia, weight loss, recurrent vomiting

## 2023-12-18 ENCOUNTER — Other Ambulatory Visit: Payer: Self-pay | Admitting: Family Medicine

## 2023-12-18 ENCOUNTER — Encounter: Payer: Self-pay | Admitting: Family Medicine

## 2023-12-18 DIAGNOSIS — E039 Hypothyroidism, unspecified: Secondary | ICD-10-CM

## 2023-12-18 LAB — COMPREHENSIVE METABOLIC PANEL WITH GFR
AG Ratio: 1.3 (calc) (ref 1.0–2.5)
ALT: 26 U/L (ref 6–29)
AST: 24 U/L (ref 10–35)
Albumin: 3.9 g/dL (ref 3.6–5.1)
Alkaline phosphatase (APISO): 63 U/L (ref 37–153)
BUN/Creatinine Ratio: 13 (calc) (ref 6–22)
BUN: 15 mg/dL (ref 7–25)
CO2: 27 mmol/L (ref 20–32)
Calcium: 9.3 mg/dL (ref 8.6–10.4)
Chloride: 102 mmol/L (ref 98–110)
Creat: 1.14 mg/dL — ABNORMAL HIGH (ref 0.50–1.05)
Globulin: 2.9 g/dL (ref 1.9–3.7)
Glucose, Bld: 95 mg/dL (ref 65–99)
Potassium: 4.3 mmol/L (ref 3.5–5.3)
Sodium: 140 mmol/L (ref 135–146)
Total Bilirubin: 0.2 mg/dL (ref 0.2–1.2)
Total Protein: 6.8 g/dL (ref 6.1–8.1)
eGFR: 53 mL/min/{1.73_m2} — ABNORMAL LOW (ref >=60)

## 2023-12-18 LAB — VITAMIN D 25 HYDROXY (VIT D DEFICIENCY, FRACTURES): Vit D, 25-Hydroxy: 90 ng/mL (ref 30–100)

## 2023-12-18 LAB — CBC WITH DIFFERENTIAL/PLATELET
Absolute Lymphocytes: 2153 {cells}/uL (ref 850–3900)
Absolute Monocytes: 534 {cells}/uL (ref 200–950)
Basophils Absolute: 39 {cells}/uL (ref 0–200)
Basophils Relative: 0.4 %
Eosinophils Absolute: 310 {cells}/uL (ref 15–500)
Eosinophils Relative: 3.2 %
HCT: 39.5 % (ref 35.0–45.0)
Hemoglobin: 12.8 g/dL (ref 11.7–15.5)
MCH: 28.4 pg (ref 27.0–33.0)
MCHC: 32.4 g/dL (ref 32.0–36.0)
MCV: 87.6 fL (ref 80.0–100.0)
MPV: 10 fL (ref 7.5–12.5)
Monocytes Relative: 5.5 %
Neutro Abs: 6664 {cells}/uL (ref 1500–7800)
Neutrophils Relative %: 68.7 %
Platelets: 254 10*3/uL (ref 140–400)
RBC: 4.51 10*6/uL (ref 3.80–5.10)
RDW: 14.2 % (ref 11.0–15.0)
Total Lymphocyte: 22.2 %
WBC: 9.7 10*3/uL (ref 3.8–10.8)

## 2023-12-18 LAB — LIPID PANEL
Cholesterol: 141 mg/dL (ref <200)
HDL: 54 mg/dL (ref >=50)
LDL Cholesterol (Calc): 66 mg/dL
Non-HDL Cholesterol (Calc): 87 mg/dL (ref <130)
Total CHOL/HDL Ratio: 2.6 (calc) (ref <5.0)
Triglycerides: 129 mg/dL (ref <150)

## 2023-12-18 LAB — TSH: TSH: 5.71 m[IU]/L — ABNORMAL HIGH (ref 0.40–4.50)

## 2023-12-18 LAB — MICROALBUMIN / CREATININE URINE RATIO
Creatinine, Urine: 55 mg/dL (ref 20–275)
Microalb Creat Ratio: 4 mg/g{creat} (ref <30)
Microalb, Ur: 0.2 mg/dL

## 2023-12-18 LAB — HEMOGLOBIN A1C
Hgb A1c MFr Bld: 6.9 % — ABNORMAL HIGH (ref <5.7)
Mean Plasma Glucose: 151 mg/dL
eAG (mmol/L): 8.4 mmol/L

## 2023-12-18 MED ORDER — LEVOTHYROXINE SODIUM 88 MCG PO TABS: 88.0000 ug | ORAL_TABLET | Freq: Every day | ORAL | Status: DC

## 2023-12-24 ENCOUNTER — Other Ambulatory Visit: Payer: Self-pay | Admitting: Family Medicine

## 2023-12-24 ENCOUNTER — Other Ambulatory Visit: Payer: Self-pay

## 2023-12-24 DIAGNOSIS — E039 Hypothyroidism, unspecified: Secondary | ICD-10-CM

## 2023-12-24 MED ORDER — LEVOTHYROXINE SODIUM 88 MCG PO TABS: 88.0000 ug | ORAL_TABLET | Freq: Every day | ORAL | 0 refills | Status: DC

## 2023-12-25 ENCOUNTER — Telehealth: Payer: Self-pay | Admitting: *Deleted

## 2023-12-25 NOTE — Progress Notes (Signed)
 Care Guide Pharmacy Note  12/25/2023 Name: Pressley Valenzano MRN: [id number] DOB: 08-01-1956  Referred By: Jenelle Mis, FNP Reason for referral: Complex Care Management (Initial outreach to schedule referral with Pharmacist )   Kalirose Hedrich is a 68 y.o. year old female who is a primary care patient of Jenelle Mis, FNP.  Lameca Kolakowski was referred to the pharmacist for assistance related to: DMII  Successful contact was made with the patient to discuss pharmacy services including being ready for the pharmacist to call at least 5 minutes before the scheduled appointment time and to have medication bottles and any blood pressure readings ready for review. The patient agreed to meet with the pharmacist via telephone visit on (date/time). 900 AM 12/26/23  [facility], [facility] Guide  Direct Dial: [phone]  Fax [phone]

## 2023-12-26 ENCOUNTER — Other Ambulatory Visit: Payer: Self-pay

## 2023-12-26 ENCOUNTER — Telehealth: Payer: Self-pay

## 2023-12-26 NOTE — Progress Notes (Signed)
   12/26/2023  Patient ID: Kristina Webb, female   DOB: Jun 11, 1956, 68 y.o.   MRN: [id number]  Attempted to contact patient for scheduled appointment for medication management. Patient can reach me at my direct line below should she call back.   Rolando Cliche, PharmD, BCGP Clinical Pharmacist  [phone]

## 2023-12-26 NOTE — Progress Notes (Signed)
 12/26/2023  Patient ID: Kristina Webb, female   DOB: [birth year], 68 y.o.   MRN: [id number]    12/26/2023 Name: Kristina Webb MRN: [id number] DOB: 06/03/1956  Chief Complaint  Patient presents with   Medication Management   Diabetes    Kristina Webb is a 68 y.o. year old female who presented for a telephone visit.   They were referred to the pharmacist by their PCP for assistance in managing medication access.    Subjective:  Care Team: Primary Care Provider: Jenelle Mis, FNP ; Next Scheduled Visit:   Future Appointments  Date Time Provider Department Center  01/27/2024  2:20 PM Clearnce Curia, NP [facility]  04/14/2024 10:00 AM [facility]  04/19/2024  2:00 PM Jenelle Mis, FNP [facility]     Medication Access/Adherence  Current Pharmacy:  [id number] [place], [place] - [address] AT [facility] [address] [place] [address] Phone: [phone] Fax: [phone]  [facility] - [place], [place] - [address] [facility] AT Portal to Registered [address] [place] [address] Phone: [phone] Fax: [phone]  [facility] - [place], [place] - [address] [place] [address] Phone: [phone] Fax: [phone]  [id number] - [place], [place] - [address] AT [facility] OF [address] [place] [address] Phone: [phone] Fax: [phone]   Patient reports affordability concerns with their medications: Yes  Patient reports access/transportation concerns to their pharmacy: No  Patient reports adherence concerns with their medications:  Yes  - related to cost of medications   Medication Management: -- Reports cost of medications being the only adherence barrier, has enough ozempic  2mg  on hand to get through next two months.  -- No concerns regard  other current medications.    Objective:  Lab Results  Component Value Date   HGBA1C 6.9 (H) 12/17/2023    Lab Results  Component Value Date   CREATININE 1.14 (H) 12/17/2023   BUN 15 12/17/2023   NA 140 12/17/2023   K 4.3 12/17/2023   CL 102 12/17/2023   CO2 27 12/17/2023    Lab Results  Component Value Date   CHOL 141 12/17/2023   HDL 54 12/17/2023   LDLCALC 66 12/17/2023   TRIG 129 12/17/2023   CHOLHDL 2.6 12/17/2023    Medications Reviewed Today     Reviewed by Rolando Cliche, [facility] (Pharmacist) on 12/26/23 at [phone]  Med List Status: <None>   Medication Order Taking? Sig Documenting Provider Last Dose Status Informant  acetaminophen  (TYLENOL ) 500 MG tablet [id number] No Take 1,000 mg by mouth every 6 (six) hours as needed for mild pain, moderate pain or headache. [provider] Taking Active Self  albuterol  (VENTOLIN  HFA) 108 (90 Base) MCG/ACT inhaler [id number] No Inhale 2 puffs into the lungs every 4 (four) hours as needed for wheezing or shortness of breath. Wilkinson, Dana E, FNP Taking Active   amitriptyline  (ELAVIL ) 10 MG tablet [id number] No Take 1 tablet (10 mg total) by mouth at bedtime as needed. Langley Pippin, NP Taking Active   aspirin  EC 81 MG tablet [id number] No Take 1 tablet (81 mg total) by mouth daily. Swallow whole. Sheryle Donning, MD Taking Active   bisoprolol  (ZEBETA ) 10 MG tablet [id number] No TAKE 1 TABLET BY MOUTH EVERY DAY FOR BLOOD PRESSURE Wilkinson, Dana E, FNP Taking Active  Budeson-Glycopyrrol-Formoterol  (BREZTRI  AEROSPHERE) 160-9-4.8 MCG/ACT AERO [id number] No Inhale 2 puffs into the lungs in the morning and at bedtime. Rinse mouth/gargle after each use. Agra Bureau, NP Taking Active   clotrimazole -betamethasone  (LOTRISONE ) cream [id number] No Apply 1 Application topically 2 (two) times daily. Langley Pippin, NP Taking Active   estradiol  (ESTRACE ) 0.1 MG/GM vaginal cream [id number] No as needed. [provider] Taking  Active   ferrous sulfate  325 (65 FE) MG tablet [id number] No Take 1 tablet (325 mg total) by mouth daily. Webb, Padonda B, FNP Taking Active   fluticasone -salmeterol (ADVAIR DISKUS) 100-50 MCG/ACT AEPB [id number] No Inhale into the lungs. [provider] Taking Active   FREESTYLE LITE test strip [id number] No USE TO CHECK BLOOD SUGAR ONCE DAILY. DX: E11.22, Z36.6 Bardwell Bureau, NP Taking Active   furosemide  (LASIX ) 40 MG tablet [id number] No TAKE 1 TABLET DAILY FOR BLOOD PRESSURE & FLUID RETENTION / ANKLE SWELLING Wilkinson, Dana E, FNP Taking Active   ipratropium (ATROVENT ) 0.03 % nasal spray [id number] No PLACE 2 SPRAYS INTO THE NOSE 3 (THREE) TIMES DAILY. Wilkinson, Dana E, FNP Taking Active   Lancets MISC [id number] No Use to check blood sugar once daily. Dx: E11.22, N18.2 Bynum Bureau, NP Taking Active   levothyroxine  (SYNTHROID ) 88 MCG tablet [id number]  Take 1 tablet (88 mcg total) by mouth daily before breakfast. TAKE 1 TABLET DAILY ON AN EMPTY STOMACH WITH ONLY WATER FOR 30 MINUTES & NO ANTACID MEDS, CALCIUM  OR MAGNESIUM FOR 4 HOURS & AVOID BIOTIN Jenelle Mis, FNP  Active   methocarbamol  (ROBAXIN ) 500 MG tablet [id number] No Take 1 tablet (500 mg total) by mouth every 8 (eight) hours as needed for muscle spasms. Garwin Bureau, NP Taking Active   montelukast  (SINGULAIR ) 10 MG tablet [id number] No TAKE 1 TABLET BY MOUTH DAILY FOR ALLERGIES Cranford, Tonya, NP Taking Active   olmesartan  (BENICAR ) 20 MG tablet [id number] No Take 1 tablet (20 mg total) by mouth daily. TAKE 1 TABLET DAILY FOR BLOOD PRESSURE & DIABETIC KIDNEY PROTECTION Walker, Caitlin S, NP Taking Active   pantoprazole  (PROTONIX ) 40 MG tablet [id number]  TAKE 1 TABLET DAILY FOR HEARTBURN & INDIGESTION Jenelle Mis, FNP  Active   rosuvastatin  (CRESTOR ) 10 MG tablet [id number] No TAKE 1 TABLET BY MOUTH 3 TIMES A WEEK FOR CHOLESTEROL Webb, Padonda B, FNP Taking Active   Semaglutide ,0.25 or 0.5MG /DOS, (OZEMPIC , 0.25 OR 0.5  MG/DOSE,) 2 MG/1.5ML SOPN [id number] No INJECT 0.25 MG WEEKLY FOR 4 WEEKS THEN INCREASE TO 0.5 MG WEEKLY FOR 4 WEEKS Walker, Caitlin S, NP Taking Active   sertraline  (ZOLOFT ) 100 MG tablet [id number] No TAKE 1 TABLET DAILY FOR DEPRESSION/ANXIETY Cranford, Tonya, NP Taking Active   sertraline  (ZOLOFT ) 25 MG tablet [id number]  Take 1 tablet (25 mg total) by mouth daily. Yolanda Hence S, FNP  Active   Vitamin D , Ergocalciferol , (DRISDOL ) 1.25 MG (50000 UNIT) CAPS capsule [id number] No TAKE 1 CAPSULE BY MOUTH 2 DAYS A WEEK Wilkinson, Dana E, FNP Taking Active            Assessment/Plan:   Medication Management: - Currently strategy sufficient to maintain appropriate adherence to prescribed medication regimen - Contacted [employer] regarding need to change provider information given Dr Cassondra Cliff passing away. Now will be managed under [employer]. Reports to be taking 2mg  and has enough until we can apply -- earliest application date is 02/23/2024 per [employer] - Patient aware that we send out papers and plans to get them back to us  promptly  Follow Up Plan: coordinate with CPhT to have papers sent to both patient and provider.   Rolando Cliche, PharmD, BCGP Clinical Pharmacist  [phone]

## 2024-01-08 LAB — COLOGUARD: COLOGUARD: NEGATIVE

## 2024-01-11 ENCOUNTER — Other Ambulatory Visit: Payer: Self-pay | Admitting: Family Medicine

## 2024-01-11 DIAGNOSIS — I10 Essential (primary) hypertension: Secondary | ICD-10-CM

## 2024-01-13 ENCOUNTER — Encounter: Payer: Self-pay | Admitting: Family Medicine

## 2024-01-19 ENCOUNTER — Encounter: Payer: Self-pay | Admitting: Family Medicine

## 2024-01-27 ENCOUNTER — Ambulatory Visit (HOSPITAL_BASED_OUTPATIENT_CLINIC_OR_DEPARTMENT_OTHER): Admitting: Family

## 2024-02-03 ENCOUNTER — Telehealth: Payer: Self-pay

## 2024-02-03 NOTE — Telephone Encounter (Signed)
 PAP: Patient assistance application for Ozempic  through [employer] has been mailed to pt's home address on file. Provider portion of application will be faxed to provider's office.  E-filed patient portion per request.

## 2024-02-13 NOTE — Telephone Encounter (Signed)
 Faxed Provider's office portion of PAP application again for Ozempic  ([employer])

## 2024-02-15 ENCOUNTER — Encounter: Payer: Self-pay | Admitting: Family Medicine

## 2024-02-24 NOTE — Telephone Encounter (Signed)
 Re-faxed provider portion for [employer] PAP application for re-enrollment Ozempic .

## 2024-02-26 ENCOUNTER — Ambulatory Visit: Payer: Self-pay | Admitting: *Deleted

## 2024-02-26 ENCOUNTER — Encounter: Payer: Self-pay | Admitting: Family Medicine

## 2024-02-26 NOTE — Telephone Encounter (Signed)
 Copied from CRM [id number]. Topic: Clinical - Red Word Triage >> Feb 26, 2024  9:56 AM Gustabo D wrote: Off and on pain  for about a month or two. Its not a sore throat lower throat says it feels swollen on left and right side. She's on Ozempic  and thyroid  pills. She thinks something is wrong with her thyroids. She has stopped taking the Ozempic  because it becomes more painful after taking it. Reason for Disposition  [1] Sore throat is the only symptom AND [2] present > 48 hours    See triage notes.   It's a soreness in her neck over her thyroid  gland since her thyroid  medication was changed.   She also stopped taking Ozempic  because of pain after taking the shot in her neck over thyroid  area.  Answer Assessment - Initial Assessment Questions 1. ONSET: When did the throat start hurting? (Hours or days ago)      I think I have thyroid  issues.   It's a soreness in my throat and it feels swollen at times.   I've been on thyroid  medication for 10 yrs and it was changed recently.   I feel like something is going on.   I would like to see a specialist if possible. I feel pain in neck area over my thyroid  gland.  No sore throat but it kind of feels like it.    It's lower down in my neck.   I'm on Ozempic  too.   I stopped taking it because it seems my neck area would start hurting after I would take a shot of Ozempic .   So I don't know what is going on. 2. SEVERITY: How bad is the sore throat? (Scale 1-10; mild, moderate or severe)   - MILD (1-3):  Doesn't interfere with eating or normal activities.   - MODERATE (4-7): Interferes with eating some solids and normal activities.   - SEVERE (8-10):  Excruciating pain, interferes with most normal activities.   - SEVERE WITH DYSPHAGIA (10): Can't swallow liquids, drooling.     It's just an uncomfortable feeling I have every so often. 3. STREP EXPOSURE: Has there been any exposure to strep within the past week? If Yes, ask: What type of contact occurred?       N/A 4.  VIRAL SYMPTOMS: Are there any symptoms of a cold, such as a runny nose, cough, hoarse voice or red eyes?      N/A 5. FEVER: Do you have a fever? If Yes, ask: What is your temperature, how was it measured, and when did it start?     N/A 6. PUS ON THE TONSILS: Is there pus on the tonsils in the back of your throat?     N/A 7. OTHER SYMPTOMS: Do you have any other symptoms? (e.g., difficulty breathing, headache, rash)     See above 8. PREGNANCY: Is there any chance you are pregnant? When was your last menstrual period?     Not asked  Protocols used: Sore Throat-A-AH FYI Only or Action Required?: FYI only for provider.  Patient was last seen in primary care on 12/17/2023 by Kayla Jeoffrey RAMAN, FNP. Called Nurse Triage reporting Sore Throat. Symptoms began several weeks ago. Interventions attempted: Other: stopped taking Ozempic . Symptoms are: gradually worsening  Area over thyroid  in neck is uncomfortable and swollen since thyroid  med was changed.   Requesting to see a specialist .  Triage Disposition: See PCP When Office is Open (Within 3 Days)  Patient/caregiver understands and will  follow disposition?: Yes

## 2024-03-01 ENCOUNTER — Other Ambulatory Visit: Payer: Self-pay | Admitting: Family

## 2024-03-01 DIAGNOSIS — E611 Iron deficiency: Secondary | ICD-10-CM

## 2024-03-03 ENCOUNTER — Encounter: Payer: Self-pay | Admitting: Family Medicine

## 2024-03-03 ENCOUNTER — Ambulatory Visit: Admitting: Family Medicine

## 2024-03-03 VITALS — BP 115/68 | HR 66 | Temp 98.7°F | Ht 63.5 in | Wt 227.4 lb

## 2024-03-03 DIAGNOSIS — E039 Hypothyroidism, unspecified: Secondary | ICD-10-CM

## 2024-03-03 DIAGNOSIS — K2100 Gastro-esophageal reflux disease with esophagitis, without bleeding: Secondary | ICD-10-CM | POA: Diagnosis not present

## 2024-03-03 DIAGNOSIS — R1319 Other dysphagia: Secondary | ICD-10-CM

## 2024-03-03 DIAGNOSIS — I10 Essential (primary) hypertension: Secondary | ICD-10-CM | POA: Diagnosis not present

## 2024-03-03 LAB — TSH: TSH: 5.44 m[IU]/L — ABNORMAL HIGH (ref 0.40–4.50)

## 2024-03-03 MED ORDER — OLMESARTAN MEDOXOMIL 40 MG PO TABS: 40.0000 mg | ORAL_TABLET | Freq: Every day | ORAL | 1 refills | Status: DC

## 2024-03-03 NOTE — Progress Notes (Signed)
 Subjective:  HPI: Kristina Webb is a 68 y.o. female presenting on 03/03/2024 for Medical Management of Chronic Issues (Discomfort over thyroid  area in neck since thyroid  medication changed. Also gets sore after taking Ozempic  shot so stopped taking i)   HPI Patient is in today for several months of intermittent dysphagia that is more prominent on the right side. She has noticed this increasing in frequency to almost daily. She feels a sore throat that is deep, worse when swallowing, especially food, specifically bread or cornbread. She reports she cannot swallow and breathe at the same time without thinking about it. This is worse on the right side.  Has been taking Ozempic  intermittently for 2 years, has discontinued this 3 weeks ago due to trouble obtaining. She does have a history of uncontrolled GERD and is taking pantoprazole  40mg  daily.   Review of Systems  All other systems reviewed and are negative.   Relevant past medical history reviewed and updated as indicated.   Past Medical History:  Diagnosis Date   Anemia    hx   Anxiety    Arthritis    back   Asthma    Depression    Deviated septum    GERD (gastroesophageal reflux disease)    Headache    History of hiatal hernia    Hyperlipemia    Hypertension    Hypothyroidism    OSA (obstructive sleep apnea)    does not use -last 6 months   Pre-diabetes    per pt     Past Surgical History:  Procedure Laterality Date   ANKLE GANGLION CYST EXCISION Left    BACK SURGERY     L3-4 decompression   birth mark treatments     BREAST LUMPECTOMY WITH RADIOACTIVE SEED LOCALIZATION Left 02/02/2016   Procedure: LEFT BREAST LUMPECTOMY WITH RADIOACTIVE SEED LOCALIZATION;  Surgeon: Deward Null III, MD;  Location: [facility] OR;  Service: General;  Laterality: Left;   BREAST SURGERY Right    papilloma removed,cyst lft   CATARACT EXTRACTION Right 12/2016   Dr. Leora   CHOLECYSTECTOMY N/A 02/20/2017   Procedure: LAPAROSCOPIC CHOLECYSTECTOMY  WITH INTRAOPERATIVE CHOLANGIOGRAM;  Surgeon: Null Deward MOULD, MD;  Location: [facility] OR;  Service: General;  Laterality: N/A;   COLON SURGERY  2008   resection- endometriosis    Allergies and medications reviewed and updated.   Current Outpatient Medications:    acetaminophen  (TYLENOL ) 500 MG tablet, Take 1,000 mg by mouth every 6 (six) hours as needed for mild pain, moderate pain or headache., Disp: , Rfl:    albuterol  (VENTOLIN  HFA) 108 (90 Base) MCG/ACT inhaler, Inhale 2 puffs into the lungs every 4 (four) hours as needed for wheezing or shortness of breath., Disp: 8.5 each, Rfl: 1   amitriptyline  (ELAVIL ) 10 MG tablet, Take 1 tablet (10 mg total) by mouth at bedtime as needed., Disp: 90 tablet, Rfl: 3   aspirin  EC 81 MG tablet, Take 1 tablet (81 mg total) by mouth daily. Swallow whole., Disp: 90 tablet, Rfl: 3   bisoprolol  (ZEBETA ) 10 MG tablet, TAKE 1 TABLET BY MOUTH EVERY DAY FOR BLOOD PRESSURE, Disp: 90 tablet, Rfl: 3   Budeson-Glycopyrrol-Formoterol  (BREZTRI  AEROSPHERE) 160-9-4.8 MCG/ACT AERO, Inhale 2 puffs into the lungs in the morning and at bedtime. Rinse mouth/gargle after each use., Disp: 5.9 g, Rfl: 2   clotrimazole -betamethasone  (LOTRISONE ) cream, Apply 1 Application topically 2 (two) times daily., Disp: 15 g, Rfl: 2   estradiol  (ESTRACE ) 0.1 MG/GM vaginal cream, as needed., Disp: , Rfl:  ferrous sulfate  325 (65 FE) MG tablet, Take 1 tablet (325 mg total) by mouth daily., Disp: 90 tablet, Rfl: 0   fluticasone -salmeterol (ADVAIR DISKUS) 100-50 MCG/ACT AEPB, Inhale into the lungs., Disp: , Rfl:    FREESTYLE LITE test strip, USE TO CHECK BLOOD SUGAR ONCE DAILY. DX: E11.22, N18.2, Disp: 100 strip, Rfl: 2   furosemide  (LASIX ) 40 MG tablet, TAKE 1 TABLET DAILY FOR BLOOD PRESSURE & FLUID RETENTION / ANKLE SWELLING, Disp: 90 tablet, Rfl: 3   ipratropium (ATROVENT ) 0.03 % nasal spray, PLACE 2 SPRAYS INTO THE NOSE 3 (THREE) TIMES DAILY., Disp: 90 mL, Rfl: 1   Lancets MISC, Use to check blood  sugar once daily. Dx: E11.22, N18.2, Disp: 100 each, Rfl: 2   levothyroxine  (SYNTHROID ) 88 MCG tablet, Take 1 tablet (88 mcg total) by mouth daily before breakfast. TAKE 1 TABLET DAILY ON AN EMPTY STOMACH WITH ONLY WATER FOR 30 MINUTES & NO ANTACID MEDS, CALCIUM  OR MAGNESIUM FOR 4 HOURS & AVOID BIOTIN, Disp: 90 tablet, Rfl: 0   methocarbamol  (ROBAXIN ) 500 MG tablet, Take 1 tablet (500 mg total) by mouth every 8 (eight) hours as needed for muscle spasms., Disp: 60 tablet, Rfl: 0   montelukast  (SINGULAIR ) 10 MG tablet, TAKE 1 TABLET BY MOUTH DAILY FOR ALLERGIES, Disp: 90 tablet, Rfl: 3   pantoprazole  (PROTONIX ) 40 MG tablet, TAKE 1 TABLET DAILY FOR HEARTBURN & INDIGESTION, Disp: 90 tablet, Rfl: 1   rosuvastatin  (CRESTOR ) 10 MG tablet, TAKE 1 TABLET BY MOUTH 3 TIMES A WEEK FOR CHOLESTEROL, Disp: 36 tablet, Rfl: 3   Semaglutide ,0.25 or 0.5MG /DOS, (OZEMPIC , 0.25 OR 0.5 MG/DOSE,) 2 MG/1.5ML SOPN, INJECT 0.25 MG WEEKLY FOR 4 WEEKS THEN INCREASE TO 0.5 MG WEEKLY FOR 4 WEEKS, Disp: 1 mL, Rfl: 0   sertraline  (ZOLOFT ) 100 MG tablet, TAKE 1 TABLET DAILY FOR DEPRESSION/ANXIETY, Disp: 90 tablet, Rfl: 3   sertraline  (ZOLOFT ) 25 MG tablet, TAKE 1 TABLET (25 MG TOTAL) BY MOUTH DAILY., Disp: 90 tablet, Rfl: 2   Vitamin D , Ergocalciferol , (DRISDOL ) 1.25 MG (50000 UNIT) CAPS capsule, TAKE 1 CAPSULE BY MOUTH 2 DAYS A WEEK, Disp: 24 capsule, Rfl: 2   olmesartan  (BENICAR ) 40 MG tablet, Take 1 tablet (40 mg total) by mouth daily. TAKE 1 TABLET DAILY FOR BLOOD PRESSURE & DIABETIC KIDNEY PROTECTION, Disp: 90 tablet, Rfl: 1  Allergies  Allergen Reactions   Levaquin [Levofloxacin In D5w] Other (See Comments)    Ankles hurt really bad ? ACHILLES TENDON ?   Doxycycline Other (See Comments)    UNSPECIFIED REACTION     Objective:   BP 115/68   Pulse 66   Temp 98.7 F (37.1 C)   Ht 5' 3.5 (1.613 m)   Wt 227 lb 6.4 oz (103.1 kg)   SpO2 96%   BMI 39.65 kg/m      03/03/2024    2:28 PM 12/17/2023    1:58 PM 11/07/2023     2:46 PM  Vitals with BMI  Height 5' 3.5 5' 3.5 5' 3  Weight 227 lbs 6 oz 226 lbs 221 lbs  BMI 39.65 39.4 39.16  Systolic 115 126 893  Diastolic 68 74 72  Pulse 66 78 58     Physical Exam Vitals and nursing note reviewed.  Constitutional:      Appearance: Normal appearance. She is normal weight.  HENT:     Head: Normocephalic and atraumatic.     Mouth/Throat:     Mouth: No oral lesions.     Tongue: No  lesions. Tongue does not deviate from midline.     Palate: No mass and lesions.     Pharynx: Oropharynx is clear. Uvula midline.     Tonsils: 1+ on the right. 1+ on the left.  Neck:     Thyroid : No thyroid  mass or thyromegaly.  Musculoskeletal:     Cervical back: No tenderness.  Lymphadenopathy:     Cervical: No cervical adenopathy.  Skin:    General: Skin is warm and dry.  Neurological:     General: No focal deficit present.     Mental Status: She is alert and oriented to person, place, and time. Mental status is at baseline.  Psychiatric:        Mood and Affect: Mood normal.        Behavior: Behavior normal.        Thought Content: Thought content normal.        Judgment: Judgment normal.     Assessment & Plan:  Esophageal dysphagia Assessment & Plan: Symptoms of right sided swelling, will obtain CT. Referral to GI for evaluation and endoscopy. Discussed when to proceed to ED and red flags. Ensure avoidance of GERD triggers and medication compliance.   Orders: -     Ambulatory referral to Gastroenterology -     CT SOFT TISSUE NECK W CONTRAST; Future  Essential hypertension -     Olmesartan  Medoxomil; Take 1 tablet (40 mg total) by mouth daily. TAKE 1 TABLET DAILY FOR BLOOD PRESSURE & DIABETIC KIDNEY PROTECTION  Dispense: 90 tablet; Refill: 1  Hypothyroidism, unspecified type Assessment & Plan: Continue synthroid  88mcg daily. TSH today. Euthyroid. Eval CT for dysphagia.   Orders: -     TSH  Gastroesophageal reflux disease with esophagitis, unspecified  whether hemorrhage Assessment & Plan: Chronic well controlled on Protonix  40mg  daily. Elevated HOB if needed and avoid lying down 2-3 hours after eating, avoid coffee, alcohol, chocolate, fatty foods, citrus, carbonated beverages, spicy foods, late meals, and smoking. Return to office if symptoms return or worsen and seek medical care for difficulty swallowing, bleeding, anemia, weight loss, recurrent vomiting        Follow up plan: Return for annual physical with labs 1 week prior.  Jeoffrey GORMAN Barrio, FNP

## 2024-03-03 NOTE — Assessment & Plan Note (Signed)
 Symptoms of right sided swelling, will obtain CT. Referral to GI for evaluation and endoscopy. Discussed when to proceed to ED and red flags. Ensure avoidance of GERD triggers and medication compliance.

## 2024-03-03 NOTE — Assessment & Plan Note (Signed)
 Chronic well controlled on Protonix  40mg  daily. Elevated HOB if needed and avoid lying down 2-3 hours after eating, avoid coffee, alcohol, chocolate, fatty foods, citrus, carbonated beverages, spicy foods, late meals, and smoking. Return to office if symptoms return or worsen and seek medical care for difficulty swallowing, bleeding, anemia, weight loss, recurrent vomiting

## 2024-03-03 NOTE — Assessment & Plan Note (Signed)
 Continue synthroid  88mcg daily. TSH today. Euthyroid. Eval CT for dysphagia.

## 2024-03-04 ENCOUNTER — Ambulatory Visit: Payer: Self-pay | Admitting: Family Medicine

## 2024-03-05 NOTE — Telephone Encounter (Signed)
 PAP: Application for Ozempic has been submitted to [employer], via fax

## 2024-03-15 ENCOUNTER — Encounter: Payer: Self-pay | Admitting: Family Medicine

## 2024-03-15 ENCOUNTER — Inpatient Hospital Stay: Admission: RE | Admit: 2024-03-15 | Source: Ambulatory Visit

## 2024-03-16 ENCOUNTER — Other Ambulatory Visit: Payer: Self-pay

## 2024-03-16 DIAGNOSIS — E039 Hypothyroidism, unspecified: Secondary | ICD-10-CM

## 2024-03-16 MED ORDER — LEVOTHYROXINE SODIUM 88 MCG PO TABS
88.0000 ug | ORAL_TABLET | Freq: Every day | ORAL | 0 refills | Status: DC
Start: 2024-03-16 — End: 2024-04-21

## 2024-03-16 NOTE — Telephone Encounter (Signed)
 PAP: Patient assistance application for Ozempic  has been approved by PAP Companies: [employer] from 03/16/2024 to 03/17/2025. Medication should be delivered to PAP Delivery: Provider's office. For further shipping updates, please contact [employer] at [phone]. Patient ID is: not provided

## 2024-03-17 ENCOUNTER — Ambulatory Visit
Admission: RE | Admit: 2024-03-17 | Discharge: 2024-03-17 | Disposition: A | Discharge status: Home / Self Care | Source: Ambulatory Visit | Attending: Family Medicine | Admitting: Family Medicine

## 2024-03-17 DIAGNOSIS — R1319 Other dysphagia: Secondary | ICD-10-CM

## 2024-03-17 MED ORDER — IOPAMIDOL (ISOVUE-370) INJECTION 76%
75.0000 mL | Freq: Once | INTRAVENOUS | Status: AC | PRN
  Administered 2024-03-17: 75 mL via INTRAVENOUS

## 2024-03-18 ENCOUNTER — Other Ambulatory Visit: Payer: Self-pay

## 2024-03-18 DIAGNOSIS — E559 Vitamin D deficiency, unspecified: Secondary | ICD-10-CM

## 2024-03-18 MED ORDER — VITAMIN D (ERGOCALCIFEROL) 1.25 MG (50000 UNIT) PO CAPS: ORAL_CAPSULE | ORAL | 2 refills | Status: DC

## 2024-03-19 ENCOUNTER — Encounter: Payer: Self-pay | Admitting: Gastroenterology

## 2024-03-30 DIAGNOSIS — M25562 Pain in left knee: Secondary | ICD-10-CM | POA: Insufficient documentation

## 2024-03-30 DIAGNOSIS — M1712 Unilateral primary osteoarthritis, left knee: Secondary | ICD-10-CM | POA: Insufficient documentation

## 2024-04-01 ENCOUNTER — Emergency Department (HOSPITAL_COMMUNITY)

## 2024-04-01 ENCOUNTER — Other Ambulatory Visit: Payer: Self-pay

## 2024-04-01 ENCOUNTER — Emergency Department (HOSPITAL_COMMUNITY)
Admission: EM | Admit: 2024-04-01 | Discharge: 2024-04-01 | Disposition: A | Discharge status: Home / Self Care | Attending: Emergency Medicine | Admitting: Emergency Medicine

## 2024-04-01 ENCOUNTER — Encounter (HOSPITAL_COMMUNITY): Payer: Self-pay

## 2024-04-01 DIAGNOSIS — S8392XA Sprain of unspecified site of left knee, initial encounter: Secondary | ICD-10-CM | POA: Diagnosis not present

## 2024-04-01 DIAGNOSIS — X501XXA Overexertion from prolonged static or awkward postures, initial encounter: Secondary | ICD-10-CM | POA: Diagnosis not present

## 2024-04-01 DIAGNOSIS — Z7982 Long term (current) use of aspirin: Secondary | ICD-10-CM | POA: Diagnosis not present

## 2024-04-01 DIAGNOSIS — M7989 Other specified soft tissue disorders: Secondary | ICD-10-CM | POA: Insufficient documentation

## 2024-04-01 DIAGNOSIS — Z794 Long term (current) use of insulin: Secondary | ICD-10-CM | POA: Diagnosis not present

## 2024-04-01 DIAGNOSIS — S8992XA Unspecified injury of left lower leg, initial encounter: Secondary | ICD-10-CM | POA: Diagnosis present

## 2024-04-01 MED ORDER — ACETAMINOPHEN 325 MG PO TABS
650.0000 mg | ORAL_TABLET | Freq: Once | ORAL | Status: AC
  Administered 2024-04-01: 650 mg via ORAL
  Filled 2024-04-01: qty 2

## 2024-04-01 NOTE — ED Provider Notes (Signed)
 [place] EMERGENCY DEPARTMENT AT [facility] Provider Note   CSN: [id number] Arrival date & time: 04/01/24  1256     Patient presents with: Knee Pain   Kristina Webb is a 68 y.o. female.   HPI 68 year old female presents with left knee injury.  She started having pain in her left knee about a week ago and saw an orthopedist and had a steroid shot into her knee.  Was feeling a lot better until she was in the shower today and then feel like she twisted her knee and now has new pain on the lateral and posterior aspect.  She feels like there is a little bit of swelling.  No numbness or weakness. Took some ibuprofen earlier today.  Prior to Admission medications   Medication Sig Start Date End Date Taking? Authorizing Provider  acetaminophen  (TYLENOL ) 500 MG tablet Take 1,000 mg by mouth every 6 (six) hours as needed for mild pain, moderate pain or headache.    [provider]  albuterol  (VENTOLIN  HFA) 108 (90 Base) MCG/ACT inhaler Inhale 2 puffs into the lungs every 4 (four) hours as needed for wheezing or shortness of breath. 04/14/23   Wilkinson, Dana E, NP  amitriptyline  (ELAVIL ) 10 MG tablet Take 1 tablet (10 mg total) by mouth at bedtime as needed. 05/20/22   Laurice President, NP  aspirin  EC 81 MG tablet Take 1 tablet (81 mg total) by mouth daily. Swallow whole. 05/16/21   Lonni Slain, MD  bisoprolol  (ZEBETA ) 10 MG tablet TAKE 1 TABLET BY MOUTH EVERY DAY FOR BLOOD PRESSURE 07/07/23   Wilkinson, Dana E, NP  Budeson-Glycopyrrol-Formoterol  (BREZTRI  AEROSPHERE) 160-9-4.8 MCG/ACT AERO Inhale 2 puffs into the lungs in the morning and at bedtime. Rinse mouth/gargle after each use. 10/16/21   Jeanine Knee, NP  clotrimazole -betamethasone  (LOTRISONE ) cream Apply 1 Application topically 2 (two) times daily. 01/09/23   Cranford, Tonya, NP  estradiol  (ESTRACE ) 0.1 MG/GM vaginal cream as needed. 11/05/16   [provider]  ferrous sulfate  325 (65 FE) MG tablet Take 1  tablet (325 mg total) by mouth daily. 10/08/23   Webb, Padonda B, FNP  fluticasone -salmeterol (ADVAIR DISKUS) 100-50 MCG/ACT AEPB Inhale into the lungs. 11/11/16   [provider]  FREESTYLE LITE test strip USE TO CHECK BLOOD SUGAR ONCE DAILY. 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 08/04/23 08/03/24  Wilkinson, Dana E, NP  Lancets MISC Use to check blood sugar once daily. Dx: E11.22, N18.2 08/09/19   Jeanine Knee, NP  levothyroxine  (SYNTHROID ) 88 MCG tablet Take 1 tablet (88 mcg total) by mouth daily before breakfast. TAKE 1 TABLET DAILY ON AN EMPTY STOMACH WITH ONLY WATER FOR 30 MINUTES & NO ANTACID MEDS, CALCIUM  OR MAGNESIUM FOR 4 HOURS & AVOID BIOTIN 03/16/24   Kayla Jeoffrey RAMAN, FNP  methocarbamol  (ROBAXIN ) 500 MG tablet Take 1 tablet (500 mg total) by mouth every 8 (eight) hours as needed for muscle spasms. 02/28/22   Jeanine Knee, NP  montelukast  (SINGULAIR ) 10 MG tablet TAKE 1 TABLET BY MOUTH DAILY FOR ALLERGIES 02/17/23   Cranford, Tonya, NP  olmesartan  (BENICAR ) 40 MG tablet Take 1 tablet (40 mg total) by mouth daily. TAKE 1 TABLET DAILY FOR BLOOD PRESSURE & DIABETIC KIDNEY PROTECTION 03/03/24  Kayla Gauze S, FNP  pantoprazole  (PROTONIX ) 40 MG tablet TAKE 1 TABLET DAILY FOR HEARTBURN & INDIGESTION 12/24/23   Kayla Gauze RAMAN, FNP  rosuvastatin  (CRESTOR ) 10 MG tablet TAKE 1 TABLET BY MOUTH 3 TIMES A WEEK FOR CHOLESTEROL 10/14/23   Webb, Padonda B, FNP  Semaglutide ,0.25 or 0.5MG /DOS, (OZEMPIC , 0.25 OR 0.5 MG/DOSE,) 2 MG/1.5ML SOPN INJECT 0.25 MG WEEKLY FOR 4 WEEKS THEN INCREASE TO 0.5 MG WEEKLY FOR 4 WEEKS 11/07/23   Walker, Caitlin S, NP  sertraline  (ZOLOFT ) 100 MG tablet TAKE 1 TABLET DAILY FOR DEPRESSION/ANXIETY 09/25/23   Cranford, Tonya, NP  sertraline   (ZOLOFT ) 25 MG tablet TAKE 1 TABLET (25 MG TOTAL) BY MOUTH DAILY. 01/12/24   Kayla Gauze RAMAN, FNP  Vitamin D , Ergocalciferol , (DRISDOL ) 1.25 MG (50000 UNIT) CAPS capsule TAKE 1 CAPSULE BY MOUTH 2 DAYS A WEEK 03/18/24   Kayla Gauze RAMAN, FNP    Allergies: Levaquin [levofloxacin in d5w] and Doxycycline    Review of Systems  Musculoskeletal:  Positive for arthralgias and joint swelling.  Neurological:  Negative for weakness and numbness.    Updated Vital Signs BP 124/67 (BP Location: Left Arm)   Pulse (!) 57   Temp 99.2 F (37.3 C) (Oral)   Resp 16   Ht 5' 3 (1.6 m)   Wt 104.3 kg   SpO2 99%   BMI 40.74 kg/m   Physical Exam Vitals and nursing note reviewed.  Constitutional:      Appearance: She is well-developed.  HENT:     Head: Normocephalic and atraumatic.  Cardiovascular:     Rate and Rhythm: Normal rate and regular rhythm.     Pulses:          Posterior tibial pulses are 2+ on the left side.  Pulmonary:     Effort: Pulmonary effort is normal.  Musculoskeletal:     Left knee: Swelling (mild) present. No effusion or erythema. Decreased range of motion (mild, hard to fully flex). Tenderness present over the lateral joint line.     Comments: No significant ligament laxity  Skin:    General: Skin is warm and dry.  Neurological:     Mental Status: She is alert.     (all labs ordered are listed, but only abnormal results are displayed) Labs Reviewed - No data to display  EKG: None  Radiology: DG Knee Complete 4 Views Left Result Date: 04/01/2024 CLINICAL DATA:  knee sprain EXAM: DG KNEE COMPLETE 4+V*L* COMPARISON:  None Available. FINDINGS: No acute fracture or dislocation. Small joint effusion. There is no evidence of arthropathy or other focal bone abnormality. Soft tissues are unremarkable. IMPRESSION: Small joint effusion.  No acute fracture or dislocation. Electronically Signed   By: Rogelia Myers M.D.   On: 04/01/2024 14:15     Procedures   Medications  Ordered in the ED  acetaminophen  (TYLENOL ) tablet 650 mg (650 mg Oral Given 04/01/24 1446)                                    Medical Decision Making Amount and/or Complexity of Data Reviewed Radiology: ordered and independent interpretation performed.    Details: No fracture  Risk OTC drugs.   Patient presents with what sounds like a knee sprain.  Is neurovascularly intact.  Seems like a traumatic injury, very low suspicion for a septic or infected joint.  Patient's x-ray is unremarkable besides a small joint effusion which is  likely reactive.  She is feeling better with some Tylenol .  Will advise her to use ibuprofen, Tylenol , ice, and will put in a knee immobilizer.  She has seen Dr. Addie in the past and would like to see him and so referral has been given.  Otherwise, will discharge home with return precautions.     Final diagnoses:  Sprain of left knee, unspecified ligament, initial encounter    ED Discharge Orders     None          Freddi Hamilton, MD 04/01/24 1455

## 2024-04-01 NOTE — Progress Notes (Signed)
 Orthopedic Tech Progress Note Patient Details:  Kristina Webb Jul 30, 1956 [id number]  Ortho Devices Type of Ortho Device: Knee Immobilizer Ortho Device/Splint Location: LLE Ortho Device/Splint Interventions: Ordered, Application   Post Interventions Patient Tolerated: Well  Kristina Webb A Kristina Webb 04/01/2024, 3:03 PM

## 2024-04-01 NOTE — ED Triage Notes (Signed)
 Left knee pain x a few weeks. Was seen at [facility] earlier this week and received an injection. Today was getting out of shower and felt a pop in left knee and having difficult walking.

## 2024-04-01 NOTE — Discharge Instructions (Addendum)
 Take ibuprofen and Tylenol  to help with pain.  You may apply ice as well.  Follow-up with your orthopedist, Dr. Addie, in about 1-2 weeks.  Wear the knee immobilizer until cleared by your orthopedist.

## 2024-04-01 NOTE — ED Triage Notes (Signed)
 Pt states she was getting out of shower and felt a pop. HX of arthritis. C/O left knee pain and states non weight bearing. Denies falling/hitting head. Axox4.

## 2024-04-13 ENCOUNTER — Encounter: Payer: PPO | Admitting: Nurse Practitioner

## 2024-04-14 ENCOUNTER — Other Ambulatory Visit

## 2024-04-14 DIAGNOSIS — E1169 Type 2 diabetes mellitus with other specified complication: Secondary | ICD-10-CM

## 2024-04-14 DIAGNOSIS — I10 Essential (primary) hypertension: Secondary | ICD-10-CM

## 2024-04-14 DIAGNOSIS — E1122 Type 2 diabetes mellitus with diabetic chronic kidney disease: Secondary | ICD-10-CM

## 2024-04-14 DIAGNOSIS — E119 Type 2 diabetes mellitus without complications: Secondary | ICD-10-CM

## 2024-04-14 DIAGNOSIS — E039 Hypothyroidism, unspecified: Secondary | ICD-10-CM

## 2024-04-14 DIAGNOSIS — Z13220 Encounter for screening for lipoid disorders: Secondary | ICD-10-CM

## 2024-04-14 DIAGNOSIS — E611 Iron deficiency: Secondary | ICD-10-CM

## 2024-04-19 ENCOUNTER — Encounter: Payer: Self-pay | Admitting: Family Medicine

## 2024-04-19 ENCOUNTER — Ambulatory Visit (INDEPENDENT_AMBULATORY_CARE_PROVIDER_SITE_OTHER): Admitting: Family Medicine

## 2024-04-19 ENCOUNTER — Ambulatory Visit: Payer: Self-pay | Admitting: Family Medicine

## 2024-04-19 VITALS — BP 126/84 | HR 71 | Temp 98.4°F | Ht 63.0 in | Wt 233.0 lb

## 2024-04-19 DIAGNOSIS — E1122 Type 2 diabetes mellitus with diabetic chronic kidney disease: Secondary | ICD-10-CM | POA: Diagnosis not present

## 2024-04-19 DIAGNOSIS — I10 Essential (primary) hypertension: Secondary | ICD-10-CM

## 2024-04-19 DIAGNOSIS — E039 Hypothyroidism, unspecified: Secondary | ICD-10-CM

## 2024-04-19 DIAGNOSIS — H60502 Unspecified acute noninfective otitis externa, left ear: Secondary | ICD-10-CM | POA: Insufficient documentation

## 2024-04-19 DIAGNOSIS — N183 Chronic kidney disease, stage 3 unspecified: Secondary | ICD-10-CM

## 2024-04-19 DIAGNOSIS — Z0001 Encounter for general adult medical examination with abnormal findings: Secondary | ICD-10-CM | POA: Diagnosis not present

## 2024-04-19 DIAGNOSIS — E1169 Type 2 diabetes mellitus with other specified complication: Secondary | ICD-10-CM | POA: Diagnosis not present

## 2024-04-19 DIAGNOSIS — K2100 Gastro-esophageal reflux disease with esophagitis, without bleeding: Secondary | ICD-10-CM

## 2024-04-19 DIAGNOSIS — R300 Dysuria: Secondary | ICD-10-CM | POA: Diagnosis not present

## 2024-04-19 DIAGNOSIS — Z13820 Encounter for screening for osteoporosis: Secondary | ICD-10-CM

## 2024-04-19 DIAGNOSIS — I25118 Atherosclerotic heart disease of native coronary artery with other forms of angina pectoris: Secondary | ICD-10-CM | POA: Diagnosis not present

## 2024-04-19 DIAGNOSIS — E559 Vitamin D deficiency, unspecified: Secondary | ICD-10-CM

## 2024-04-19 DIAGNOSIS — Z0000 Encounter for general adult medical examination without abnormal findings: Secondary | ICD-10-CM | POA: Insufficient documentation

## 2024-04-19 DIAGNOSIS — R1319 Other dysphagia: Secondary | ICD-10-CM

## 2024-04-19 DIAGNOSIS — Z7985 Long-term (current) use of injectable non-insulin antidiabetic drugs: Secondary | ICD-10-CM

## 2024-04-19 DIAGNOSIS — F324 Major depressive disorder, single episode, in partial remission: Secondary | ICD-10-CM

## 2024-04-19 MED ORDER — OZEMPIC (0.25 OR 0.5 MG/DOSE) 2 MG/1.5ML ~~LOC~~ SOPN: PEN_INJECTOR | SUBCUTANEOUS | Status: DC

## 2024-04-19 MED ORDER — HYDROCORTISONE-ACETIC ACID 1-2 % OT SOLN: 5.0000 [drp] | Freq: Four times a day (QID) | OTIC | 0 refills | Status: DC

## 2024-04-19 NOTE — Assessment & Plan Note (Addendum)
 Continue rosuvastatin  10mg  daily. LDL goal <70. I recommend consuming a heart healthy diet such as Mediterranean diet or DASH diet with whole grains, fruits, vegetable, fish, lean meats, nuts, and olive oil. Limit sweets and processed foods. I also encourage moderate intensity exercise 150 minutes weekly. This is 3-5 times weekly for 30-50 minutes each session. Goal should be pace of 3 miles/hours, or walking 1.5 miles in 30 minutes. The 10-year ASCVD risk score (Arnett DK, et al., 2019) is: 12.5% Lipid Panel     Component Value Date/Time   CHOL 147 04/14/2024 0959   TRIG 127 04/14/2024 0959   HDL 62 04/14/2024 0959   CHOLHDL 2.4 04/14/2024 0959   VLDL 25 03/24/2017 1643   LDLCALC 64 04/14/2024 0959

## 2024-04-19 NOTE — Assessment & Plan Note (Signed)
 Chronic well controlled. Continue Olmesartan , Lasix , Bisoprolol . Recommend heart healthy diet such as Mediterranean diet with whole grains, fruits, vegetable, fish, lean meats, nuts, and olive oil. Limit salt. Encouraged moderate walking, 3-5 times/week for 30-50 minutes each session. Aim for at least 150 minutes.week. Goal should be pace of 3 miles/hours, or walking 1.5 miles in 30 minutes. Avoid tobacco products. Avoid excess alcohol. Take medications as prescribed and bring medications and blood pressure log with cuff to each office visit. Seek medical care for chest pain, palpitations, shortness of breath with exertion, dizziness/lightheadedness, vision changes, recurrent headaches, or swelling of extremities

## 2024-04-19 NOTE — Assessment & Plan Note (Signed)
 Redness with white slough present to left ear with mild itching, will start acetic acid -hydrocortisone  5 drops 4 times daily x7d. Follow up PRN

## 2024-04-19 NOTE — Progress Notes (Signed)
 Complete physical exam  Patient: Kristina Webb   DOB: November 16, 1955   68 y.o. Female  MRN: [id number]  Subjective:    Chief Complaint  Patient presents with   Annual Exam    Kristina Webb is a 68 y.o. female who presents today for a complete physical exam. She reports consuming a general diet. The patient does not participate in regular exercise at present. She generally feels fairly well. She reports sleeping fairly well. She does have additional problems to discuss today. Kristina Webb also sees OBGYN for well woman's care. Is up to date on mammogram and PAP, has not had a DEXA yet. Her diabetic eye exam is due in November.   Kristina Webb has been spending a lot of time in the hospital due to her husband's recent hospitalizations. Has had a flare of her osteoarthritis in her left knee. Has seen ortho for this and received a knee injection, has not been bracing.  HTN: on olmesartan  40mg  daily, Furosemide  40mg  daily, Bisoprolol  10mg  daily, normal readings at home, diet and exercise are suffering due to recent hospitalizations of her husband  HLD: well controlled on rosuvastatin  10mg  3 days per week, LDL<70 despite lapses in healthy diet and exercise  GERD: on pantoprazole  40mg  daily, is having some dysphagia, seeing GI next month  DM2: uncontrolled with A1c 7.8%, will resume Ozempic  now that CT scan came back normal, did also have recent steroid injection in her left knee  CKD3: pushing fluids and avoid NSAIDs, on Olmesartan   Depression: on Zoloft  125mg  daily, increase in stressors at home, declines therapy  Hypothyroidism: most recent TSH 5.44 on daily synthroid , she is taking at night, current TSH pending    Most recent fall risk assessment:    12/17/2023    2:05 PM  Fall Risk   Falls in the past year? 0  Number falls in past yr: 0  Injury with Fall? 0  Follow up Falls evaluation completed     Most recent depression screenings:    12/17/2023    2:05 PM 07/28/2023    4:13 PM   PHQ 2/9 Scores  PHQ - 2 Score 0 0    Vision:Within last year and Dental: No current dental problems and Receives regular dental care  Patient Active Problem List   Diagnosis Date Noted   Physical exam, annual 04/19/2024   Acute otitis externa of left ear 04/19/2024   Esophageal dysphagia 03/03/2024   Diabetes mellitus treated with injections of non-insulin  medication (HCC) 12/17/2023   Coronary artery disease of native artery of native heart with stable angina pectoris (HCC) 12/17/2023   History of dysplastic nevus 09/26/2021   Iron deficiency 01/26/2021   Hypothyroidism    RBBB 01/25/2021   Cough variant asthma 01/18/2021   Insomnia due to other mental disorder 09/18/2020   Severe obstructive sleep apnea-hypopnea syndrome 09/18/2020   Exposure to second hand tobacco smoke 09/18/2020   Family history of heart disease 05/09/2020   Obesity hypoventilation syndrome (HCC) 08/05/2019   Lower back pain 07/17/2015   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   CKD stage 3 due to type 2 diabetes mellitus (HCC) 04/15/2014   Hyperlipidemia associated with type 2 diabetes mellitus (HCC)    Type 2 diabetes mellitus (HCC)    Asthma    Major depression in partial remission (HCC)    Hypertension    GERD (gastroesophageal reflux disease)    Cough 12/28/2012   Past Medical History:  Diagnosis Date   Anemia  hx   Anxiety    Arthritis    back   Asthma    Depression    Deviated septum    GERD (gastroesophageal reflux disease)    Headache    History of hiatal hernia    Hyperlipemia    Hypertension    Hypothyroidism    OSA (obstructive sleep apnea)    does not use -last 6 months   Pre-diabetes    per pt   Past Surgical History:  Procedure Laterality Date   ANKLE GANGLION CYST EXCISION Left    BACK SURGERY     L3-4 decompression   birth mark treatments     BREAST LUMPECTOMY WITH RADIOACTIVE SEED LOCALIZATION Left 02/02/2016   Procedure: LEFT BREAST LUMPECTOMY WITH RADIOACTIVE  SEED LOCALIZATION;  Surgeon: Deward Null III, MD;  Location: [facility] OR;  Service: General;  Laterality: Left;   BREAST SURGERY Right    papilloma removed,cyst lft   CATARACT EXTRACTION Right 12/2016   Dr. Leora   CHOLECYSTECTOMY N/A 02/20/2017   Procedure: LAPAROSCOPIC CHOLECYSTECTOMY WITH INTRAOPERATIVE CHOLANGIOGRAM;  Surgeon: Null Deward MOULD, MD;  Location: [facility] OR;  Service: General;  Laterality: N/A;   COLON SURGERY  2008   resection- endometriosis   Social History   Tobacco Use   Smoking status: Never   Smokeless tobacco: Never   Tobacco comments:    husband smokes in the house  Vaping Use   Vaping status: Never Used  Substance Use Topics   Alcohol use: No   Drug use: No   Family History  Problem Relation Age of Onset   Emphysema Mother        smoked   Allergies Mother    Asthma Mother    Heart disease Mother    Breast cancer Mother        with mets to Bone   Prostate cancer Father    Brain cancer Father    Hypertension Brother    Thyroid  disease Brother    Rheumatic fever Brother    Heart disease Brother    Hypertension Brother    Colon cancer Paternal Grandmother    Transient ischemic attack Sister    Heart disease Sister    Allergic rhinitis Neg Hx    Angioedema Neg Hx    Eczema Neg Hx    Immunodeficiency Neg Hx    Urticaria Neg Hx    Allergies  Allergen Reactions   Levaquin [Levofloxacin In D5w] Other (See Comments)    Ankles hurt really bad ? ACHILLES TENDON ?   Doxycycline Other (See Comments)    UNSPECIFIED REACTION       Patient Care Team: Kayla Jeoffrey RAMAN, FNP as PCP - General (Family Medicine) Lonni Slain, MD as PCP - Cardiology (Cardiology) Harden Jerona GAILS, MD as Consulting Physician (Orthopedic Surgery) Love, Lynwood HERO, MD as Consulting Physician (Neurology) Geraldene Loving, MD as Consulting Physician (Ophthalmology) Gastroenterology, Margarete as Consulting Physician (Gastroenterology)   Outpatient Medications Prior to Visit   Medication Sig   acetaminophen  (TYLENOL ) 500 MG tablet Take 1,000 mg by mouth every 6 (six) hours as needed for mild pain, moderate pain or headache.   albuterol  (VENTOLIN  HFA) 108 (90 Base) MCG/ACT inhaler Inhale 2 puffs into the lungs every 4 (four) hours as needed for wheezing or shortness of breath.   amitriptyline  (ELAVIL ) 10 MG tablet Take 1 tablet (10 mg total) by mouth at bedtime as needed.   aspirin  EC 81 MG tablet Take 1 tablet (81 mg total) by mouth daily.  Swallow whole.   bisoprolol  (ZEBETA ) 10 MG tablet TAKE 1 TABLET BY MOUTH EVERY DAY FOR BLOOD PRESSURE   Budeson-Glycopyrrol-Formoterol  (BREZTRI  AEROSPHERE) 160-9-4.8 MCG/ACT AERO Inhale 2 puffs into the lungs in the morning and at bedtime. Rinse mouth/gargle after each use.   clotrimazole -betamethasone  (LOTRISONE ) cream Apply 1 Application topically 2 (two) times daily.   estradiol  (ESTRACE ) 0.1 MG/GM vaginal cream as needed.   ferrous sulfate  325 (65 FE) MG tablet Take 1 tablet (325 mg total) by mouth daily.   fluticasone -salmeterol (ADVAIR DISKUS) 100-50 MCG/ACT AEPB Inhale into the lungs.   FREESTYLE LITE test strip USE TO CHECK BLOOD SUGAR ONCE DAILY. DX: E11.22, N18.2   furosemide  (LASIX ) 40 MG tablet TAKE 1 TABLET DAILY FOR BLOOD PRESSURE & FLUID RETENTION / ANKLE SWELLING   ipratropium (ATROVENT ) 0.03 % nasal spray PLACE 2 SPRAYS INTO THE NOSE 3 (THREE) TIMES DAILY.   Lancets MISC Use to check blood sugar once daily. Dx: E11.22, N18.2   levothyroxine  (SYNTHROID ) 88 MCG tablet Take 1 tablet (88 mcg total) by mouth daily before breakfast. TAKE 1 TABLET DAILY ON AN EMPTY STOMACH WITH ONLY WATER FOR 30 MINUTES & NO ANTACID MEDS, CALCIUM  OR MAGNESIUM FOR 4 HOURS & AVOID BIOTIN   methocarbamol  (ROBAXIN ) 500 MG tablet Take 1 tablet (500 mg total) by mouth every 8 (eight) hours as needed for muscle spasms.   montelukast  (SINGULAIR ) 10 MG tablet TAKE 1 TABLET BY MOUTH DAILY FOR ALLERGIES   olmesartan  (BENICAR ) 40 MG tablet Take 1  tablet (40 mg total) by mouth daily. TAKE 1 TABLET DAILY FOR BLOOD PRESSURE & DIABETIC KIDNEY PROTECTION   pantoprazole  (PROTONIX ) 40 MG tablet TAKE 1 TABLET DAILY FOR HEARTBURN & INDIGESTION   rosuvastatin  (CRESTOR ) 10 MG tablet TAKE 1 TABLET BY MOUTH 3 TIMES A WEEK FOR CHOLESTEROL   sertraline  (ZOLOFT ) 100 MG tablet TAKE 1 TABLET DAILY FOR DEPRESSION/ANXIETY   sertraline  (ZOLOFT ) 25 MG tablet TAKE 1 TABLET (25 MG TOTAL) BY MOUTH DAILY.   Vitamin D , Ergocalciferol , (DRISDOL ) 1.25 MG (50000 UNIT) CAPS capsule TAKE 1 CAPSULE BY MOUTH 2 DAYS A WEEK   [DISCONTINUED] Semaglutide ,0.25 or 0.5MG /DOS, (OZEMPIC , 0.25 OR 0.5 MG/DOSE,) 2 MG/1.5ML SOPN INJECT 0.25 MG WEEKLY FOR 4 WEEKS THEN INCREASE TO 0.5 MG WEEKLY FOR 4 WEEKS   No facility-administered medications prior to visit.    Review of Systems  Constitutional: Negative.   HENT: Negative.         Left ear itching  Eyes: Negative.   Respiratory: Negative.    Cardiovascular: Negative.   Gastrointestinal: Negative.   Genitourinary: Negative.   Musculoskeletal: Negative.   Skin: Negative.   Neurological: Negative.   Endo/Heme/Allergies: Negative.   Psychiatric/Behavioral: Negative.    All other systems reviewed and are negative.         Objective:     BP 126/84   Pulse 71   Temp 98.4 F (36.9 C)   Ht 5' 3 (1.6 m)   Wt 233 lb (105.7 kg)   SpO2 99%   BMI 41.27 kg/m  BP Readings from Last 3 Encounters:  04/19/24 126/84  04/01/24 124/67  03/03/24 115/68   Wt Readings from Last 3 Encounters:  04/19/24 233 lb (105.7 kg)  04/01/24 230 lb (104.3 kg)  03/03/24 227 lb 6.4 oz (103.1 kg)      Physical Exam Vitals and nursing note reviewed.  Constitutional:      Appearance: Normal appearance. She is obese.  HENT:     Head: Normocephalic and atraumatic.  Right Ear: Tympanic membrane, ear canal and external ear normal.     Left Ear: Tympanic membrane and external ear normal.     Ears:     Comments: Erythematous canal  with white slough    Nose: Nose normal.     Mouth/Throat:     Mouth: Mucous membranes are moist.     Pharynx: Oropharynx is clear.  Eyes:     Extraocular Movements: Extraocular movements intact.     Conjunctiva/sclera: Conjunctivae normal.     Pupils: Pupils are equal, round, and reactive to light.  Cardiovascular:     Rate and Rhythm: Normal rate and regular rhythm.     Pulses: Normal pulses.     Heart sounds: Normal heart sounds.  Pulmonary:     Effort: Pulmonary effort is normal.     Breath sounds: Normal breath sounds.  Abdominal:     General: Bowel sounds are normal.     Palpations: Abdomen is soft.  Musculoskeletal:        General: Normal range of motion.     Cervical back: Normal range of motion and neck supple.  Skin:    General: Skin is warm and dry.     Capillary Refill: Capillary refill takes less than 2 seconds.     Comments: Port wine stain right face  Neurological:     General: No focal deficit present.     Mental Status: She is alert and oriented to person, place, and time. Mental status is at baseline.  Psychiatric:        Mood and Affect: Mood normal.        Behavior: Behavior normal.        Thought Content: Thought content normal.        Judgment: Judgment normal.      No results found for any visits on 04/19/24. Last CBC Lab Results  Component Value Date   WBC 10.7 04/14/2024   HGB 13.0 04/14/2024   HCT 42.6 04/14/2024   MCV 92.4 04/14/2024   MCH 28.2 04/14/2024   RDW 14.9 04/14/2024   PLT 292 04/14/2024   Last metabolic panel Lab Results  Component Value Date   GLUCOSE 118 (H) 04/14/2024   NA 139 04/14/2024   K 4.7 04/14/2024   CL 102 04/14/2024   CO2 29 04/14/2024   BUN 28 (H) 04/14/2024   CREATININE 1.16 (H) 04/14/2024   EGFR 52 (L) 04/14/2024   CALCIUM  9.2 04/14/2024   PROT 7.4 04/14/2024   ALBUMIN 4.0 03/24/2017   BILITOT 0.3 04/14/2024   ALKPHOS 75 03/24/2017   AST 22 04/14/2024   ALT 27 04/14/2024   ANIONGAP 8 01/26/2018    Last lipids Lab Results  Component Value Date   CHOL 147 04/14/2024   HDL 62 04/14/2024   LDLCALC 64 04/14/2024   TRIG 127 04/14/2024   CHOLHDL 2.4 04/14/2024   Last hemoglobin A1c Lab Results  Component Value Date   HGBA1C 7.8 (H) 04/14/2024   Last thyroid  functions Lab Results  Component Value Date   TSH 5.44 (H) 03/03/2024   Last vitamin D  Lab Results  Component Value Date   VD25OH 90 12/17/2023   Last vitamin B12 and Folate Lab Results  Component Value Date   VITAMINB12 361 04/14/2023        Assessment & Plan:    Routine Health Maintenance and Physical Exam  Immunization History  Administered Date(s) Administered   INFLUENZA, HIGH DOSE SEASONAL PF 07/09/2022, 05/29/2023   Influenza Inj Mdck  Quad With Preservative 05/19/2018, 06/14/2020   Influenza Split 04/26/2013, 05/29/2015   Influenza Whole 05/26/2012   Influenza,inj,Quad PF,6+ Mos 06/11/2021   Influenza-Unspecified 05/20/2017, 05/19/2018, 05/21/2019   PFIZER(Purple Top)SARS-COV-2 Vaccination 10/22/2019, 11/26/2019, 05/30/2020   PNEUMOCOCCAL CONJUGATE-20 07/28/2023   Pneumococcal Polysaccharide-23 09/10/2016   Td 08/27/2007, 10/22/2017    Health Maintenance  Topic Date Due   DEXA SCAN  Never done   MAMMOGRAM  08/01/2023   OPHTHALMOLOGY EXAM  04/01/2024   COVID-19 Vaccine (4 - 2024-25 season) 05/05/2024 (Originally 04/27/2023)   Zoster Vaccines- Shingrix (1 of 2) 06/03/2024 (Originally 06/15/1975)   INFLUENZA VACCINE  11/23/2024 (Originally 03/26/2024)   FOOT EXAM  07/27/2024   Medicare Annual Wellness (AWV)  07/27/2024   HEMOGLOBIN A1C  10/15/2024   Diabetic kidney evaluation - Urine ACR  12/16/2024   Diabetic kidney evaluation - eGFR measurement  04/14/2025   Fecal DNA (Cologuard)  12/31/2026   DTaP/Tdap/Td (3 - Tdap) 10/23/2027   Pneumococcal Vaccine: 50+ Years  Completed   Hepatitis C Screening  Completed   HPV VACCINES  Aged Out   Meningococcal B Vaccine  Aged Out   Colonoscopy   Discontinued    Discussed health benefits of physical activity, and encouraged her to engage in regular exercise appropriate for her age and condition.  Problem List Items Addressed This Visit     Hypertension   Chronic well controlled. Continue Olmesartan , Lasix , Bisoprolol . Recommend heart healthy diet such as Mediterranean diet with whole grains, fruits, vegetable, fish, lean meats, nuts, and olive oil. Limit salt. Encouraged moderate walking, 3-5 times/week for 30-50 minutes each session. Aim for at least 150 minutes.week. Goal should be pace of 3 miles/hours, or walking 1.5 miles in 30 minutes. Avoid tobacco products. Avoid excess alcohol. Take medications as prescribed and bring medications and blood pressure log with cuff to each office visit. Seek medical care for chest pain, palpitations, shortness of breath with exertion, dizziness/lightheadedness, vision changes, recurrent headaches, or swelling of extremities      GERD (gastroesophageal reflux disease)   Chronic well controlled on Protonix  40mg  daily. Elevated HOB if needed and avoid lying down 2-3 hours after eating, avoid coffee, alcohol, chocolate, fatty foods, citrus, carbonated beverages, spicy foods, late meals, and smoking. Return to office if symptoms return or worsen and seek medical care for difficulty swallowing, bleeding, anemia, weight loss, recurrent vomiting  Mag pending      Major depression in partial remission (HCC)   Chronic uncontrolled. Stressors included her husbands health and certainly aspects of caregiver fatigue with neglect in self-care. Continue Zoloft  125mg  daily and consider therapy. Denies SI/HI. Encouraged therapy however time prohibits. Encouraged taking time for self care and continuing daily walks with her friends for overall health, proper nutrition, adequate sleep.      Hyperlipidemia associated with type 2 diabetes mellitus (HCC)    Continue rosuvastatin  10mg  daily. LDL goal <70. I recommend  consuming a heart healthy diet such as Mediterranean diet or DASH diet with whole grains, fruits, vegetable, fish, lean meats, nuts, and olive oil. Limit sweets and processed foods. I also encourage moderate intensity exercise 150 minutes weekly. This is 3-5 times weekly for 30-50 minutes each session. Goal should be pace of 3 miles/hours, or walking 1.5 miles in 30 minutes. The 10-year ASCVD risk score (Arnett DK, et al., 2019) is: 12.5% Lipid Panel     Component Value Date/Time   CHOL 147 04/14/2024 0959   TRIG 127 04/14/2024 0959   HDL 62 04/14/2024 0959  CHOLHDL 2.4 04/14/2024 0959   VLDL 25 03/24/2017 1643   LDLCALC 64 04/14/2024 0959         Relevant Medications   Semaglutide ,0.25 or 0.5MG /DOS, (OZEMPIC , 0.25 OR 0.5 MG/DOSE,) 2 MG/1.5ML SOPN   CKD stage 3 due to type 2 diabetes mellitus (HCC)   Stay hydrated, avoid NSAIDs and salt. Taking Olmesartan . Labs stable      Relevant Medications   Semaglutide ,0.25 or 0.5MG /DOS, (OZEMPIC , 0.25 OR 0.5 MG/DOSE,) 2 MG/1.5ML SOPN   Hypothyroidism   Continue synthroid  88mcg daily. TSH pending. Euthyroid. Normal CT      Diabetes mellitus treated with injections of non-insulin  medication (HCC)   A1c 7.8% off Ozempic . CT normal. Will restart at 0.25mg  weekly. A1c and uACR today. Foot exam utd. Vaccines utd. Retinal eye exam utd. Recommend heart healthy diet such as Mediterranean diet with whole grains, fruits, vegetable, fish, lean meats, nuts, and olive oil. Limit salt. Encouraged moderate walking, 3-5 times/week for 30-50 minutes each session. Aim for at least 150 minutes.week. Goal should be pace of 3 miles/hours, or walking 1.5 miles in 30 minutes. Seek medical care for urinary frequency, extreme thirst, vision changes, lightheadedness, dizziness.  Follow up in 3 months      Relevant Medications   Semaglutide ,0.25 or 0.5MG /DOS, (OZEMPIC , 0.25 OR 0.5 MG/DOSE,) 2 MG/1.5ML SOPN   Other Relevant Orders   Comprehensive metabolic panel with  GFR   Hemoglobin A1c   Coronary artery disease of native artery of native heart with stable angina pectoris (HCC)   Relevant Medications   Semaglutide ,0.25 or 0.5MG /DOS, (OZEMPIC , 0.25 OR 0.5 MG/DOSE,) 2 MG/1.5ML SOPN   Esophageal dysphagia   Normal CT. Scheduled with GI next month.       Physical exam, annual - Primary   Today your medical history was reviewed and routine physical exam with labs was performed. Recommend 150 minutes of moderate intensity exercise weekly and consuming a well-balanced diet. Advised to stop smoking if a smoker, avoid smoking if a non-smoker, limit alcohol consumption to 1 drink per day for women and 2 drinks per day for men, and avoid illicit drug use. Counseled in mental health awareness and when to seek medical care. Vaccine maintenance discussed. Appropriate health maintenance items reviewed. Return to office in 1 year for annual physical exam.       Acute otitis externa of left ear   Redness with white slough present to left ear with mild itching, will start acetic acid -hydrocortisone  5 drops 4 times daily x7d. Follow up PRN      Other Visit Diagnoses       Dysuria       Relevant Orders   Urinalysis, Routine w reflex microscopic     Encounter for osteoporosis screening in asymptomatic postmenopausal patient       Relevant Orders   DG Bone Density     Vitamin D  deficiency         Essential hypertension          Return in about 6 weeks (around 05/31/2024) for diabetes, follow-up.     Jeoffrey GORMAN Barrio, FNP

## 2024-04-19 NOTE — Assessment & Plan Note (Signed)
 Chronic well controlled on Protonix  40mg  daily. Elevated HOB if needed and avoid lying down 2-3 hours after eating, avoid coffee, alcohol, chocolate, fatty foods, citrus, carbonated beverages, spicy foods, late meals, and smoking. Return to office if symptoms return or worsen and seek medical care for difficulty swallowing, bleeding, anemia, weight loss, recurrent vomiting  Mag pending

## 2024-04-19 NOTE — Assessment & Plan Note (Signed)
 Stay hydrated, avoid NSAIDs and salt. Taking Olmesartan . Labs stable

## 2024-04-19 NOTE — Assessment & Plan Note (Signed)
 Continue synthroid  88mcg daily. TSH pending. Euthyroid. Normal CT

## 2024-04-19 NOTE — Assessment & Plan Note (Signed)
 Normal CT. Scheduled with GI next month.

## 2024-04-19 NOTE — Assessment & Plan Note (Signed)

## 2024-04-19 NOTE — Assessment & Plan Note (Addendum)
 A1c 7.8% off Ozempic . CT normal. Will restart at 0.25mg  weekly. A1c and uACR today. Foot exam utd. Vaccines utd. Retinal eye exam utd. Recommend heart healthy diet such as Mediterranean diet with whole grains, fruits, vegetable, fish, lean meats, nuts, and olive oil. Limit salt. Encouraged moderate walking, 3-5 times/week for 30-50 minutes each session. Aim for at least 150 minutes.week. Goal should be pace of 3 miles/hours, or walking 1.5 miles in 30 minutes. Seek medical care for urinary frequency, extreme thirst, vision changes, lightheadedness, dizziness.  Follow up in 3 months

## 2024-04-19 NOTE — Assessment & Plan Note (Signed)
 Chronic uncontrolled. Stressors included her husbands health and certainly aspects of caregiver fatigue with neglect in self-care. Continue Zoloft  125mg  daily and consider therapy. Denies SI/HI. Encouraged therapy however time prohibits. Encouraged taking time for self care and continuing daily walks with her friends for overall health, proper nutrition, adequate sleep.

## 2024-04-21 ENCOUNTER — Other Ambulatory Visit: Payer: Self-pay

## 2024-04-21 ENCOUNTER — Other Ambulatory Visit: Payer: Self-pay | Admitting: Family Medicine

## 2024-04-21 DIAGNOSIS — E039 Hypothyroidism, unspecified: Secondary | ICD-10-CM

## 2024-04-21 LAB — CBC WITH DIFFERENTIAL/PLATELET
Absolute Lymphocytes: 2504 {cells}/uL (ref 850–3900)
Absolute Monocytes: 567 {cells}/uL (ref 200–950)
Basophils Absolute: 54 {cells}/uL (ref 0–200)
Basophils Relative: 0.5 %
Eosinophils Absolute: 342 {cells}/uL (ref 15–500)
Eosinophils Relative: 3.2 %
HCT: 42.6 % (ref 35.0–45.0)
Hemoglobin: 13 g/dL (ref 11.7–15.5)
MCH: 28.2 pg (ref 27.0–33.0)
MCHC: 30.5 g/dL — ABNORMAL LOW (ref 32.0–36.0)
MCV: 92.4 fL (ref 80.0–100.0)
MPV: 10.9 fL (ref 7.5–12.5)
Monocytes Relative: 5.3 %
Neutro Abs: 7233 {cells}/uL (ref 1500–7800)
Neutrophils Relative %: 67.6 %
Platelets: 292 Thousand/uL (ref 140–400)
RBC: 4.61 Million/uL (ref 3.80–5.10)
RDW: 14.9 % (ref 11.0–15.0)
Total Lymphocyte: 23.4 %
WBC: 10.7 Thousand/uL (ref 3.8–10.8)

## 2024-04-21 LAB — COMPREHENSIVE METABOLIC PANEL WITH GFR
AG Ratio: 1.6 (calc) (ref 1.0–2.5)
ALT: 27 U/L (ref 6–29)
AST: 22 U/L (ref 10–35)
Albumin: 4.5 g/dL (ref 3.6–5.1)
Alkaline phosphatase (APISO): 65 U/L (ref 37–153)
BUN/Creatinine Ratio: 24 (calc) — ABNORMAL HIGH (ref 6–22)
BUN: 28 mg/dL — ABNORMAL HIGH (ref 7–25)
CO2: 29 mmol/L (ref 20–32)
Calcium: 9.2 mg/dL (ref 8.6–10.4)
Chloride: 102 mmol/L (ref 98–110)
Creat: 1.16 mg/dL — ABNORMAL HIGH (ref 0.50–1.05)
Globulin: 2.9 g/dL (ref 1.9–3.7)
Glucose, Bld: 118 mg/dL — ABNORMAL HIGH (ref 65–99)
Potassium: 4.7 mmol/L (ref 3.5–5.3)
Sodium: 139 mmol/L (ref 135–146)
Total Bilirubin: 0.3 mg/dL (ref 0.2–1.2)
Total Protein: 7.4 g/dL (ref 6.1–8.1)
eGFR: 52 mL/min/1.73m2 — ABNORMAL LOW (ref >=60)

## 2024-04-21 LAB — VITAMIN B12: Vitamin B-12: 461 pg/mL (ref 200–1100)

## 2024-04-21 LAB — LIPID PANEL
Cholesterol: 147 mg/dL (ref <200)
HDL: 62 mg/dL (ref >=50)
LDL Cholesterol (Calc): 64 mg/dL
Non-HDL Cholesterol (Calc): 85 mg/dL (ref <130)
Total CHOL/HDL Ratio: 2.4 (calc) (ref <5.0)
Triglycerides: 127 mg/dL (ref <150)

## 2024-04-21 LAB — IRON,TIBC AND FERRITIN PANEL
%SAT: 13 % — ABNORMAL LOW (ref 16–45)
Ferritin: 26 ng/mL (ref 16–288)
Iron: 56 ug/dL (ref 45–160)
TIBC: 421 ug/dL (ref 250–450)

## 2024-04-21 LAB — TSH: TSH: 9.1 m[IU]/L — ABNORMAL HIGH (ref 0.40–4.50)

## 2024-04-21 LAB — HEMOGLOBIN A1C
Hgb A1c MFr Bld: 7.8 % — ABNORMAL HIGH (ref <5.7)
Mean Plasma Glucose: 177 mg/dL
eAG (mmol/L): 9.8 mmol/L

## 2024-04-21 LAB — TEST AUTHORIZATION

## 2024-04-21 MED ORDER — LEVOTHYROXINE SODIUM 100 MCG PO TABS: 100.0000 ug | ORAL_TABLET | Freq: Every day | ORAL | 1 refills | Status: DC

## 2024-04-23 ENCOUNTER — Encounter (HOSPITAL_BASED_OUTPATIENT_CLINIC_OR_DEPARTMENT_OTHER): Payer: Self-pay | Admitting: Family

## 2024-04-23 ENCOUNTER — Ambulatory Visit (HOSPITAL_BASED_OUTPATIENT_CLINIC_OR_DEPARTMENT_OTHER): Admitting: Family

## 2024-04-23 ENCOUNTER — Other Ambulatory Visit: Payer: Self-pay | Admitting: Family Medicine

## 2024-04-23 VITALS — BP 130/78 | HR 54 | Ht 63.0 in | Wt 242.0 lb

## 2024-04-23 DIAGNOSIS — E785 Hyperlipidemia, unspecified: Secondary | ICD-10-CM | POA: Diagnosis not present

## 2024-04-23 DIAGNOSIS — R0609 Other forms of dyspnea: Secondary | ICD-10-CM

## 2024-04-23 DIAGNOSIS — I25118 Atherosclerotic heart disease of native coronary artery with other forms of angina pectoris: Secondary | ICD-10-CM

## 2024-04-23 DIAGNOSIS — I10 Essential (primary) hypertension: Secondary | ICD-10-CM

## 2024-04-23 DIAGNOSIS — L299 Pruritus, unspecified: Secondary | ICD-10-CM

## 2024-04-23 MED ORDER — NEOMYCIN-POLYMYXIN-HC 1 % OT SOLN: 3.0000 [drp] | Freq: Four times a day (QID) | OTIC | 1 refills | Status: AC

## 2024-04-23 NOTE — Progress Notes (Signed)
 Cardiology Office Note:  .   Date:  04/23/2024  ID:  Kristina Webb, DOB 14-May-1956, MRN [id number] PCP: Kayla Jeoffrey RAMAN, FNP  [facility] Providers Cardiologist:  Shelda Bruckner, MD    History of Present Illness: .   Kristina Webb is a 68 y.o. female with history of HTN, HLD, asthma, OSA not on CPAP, nonobstructive CAD.  Family history notable for heart failure, HTN in her mom and HTN in her father. Her sister has cardiovascular disease including CAD and stroke. Established with Dr. Bruckner 2021 due to chest pain, dyspnea. Cardiac CTA 05/2020 calcium  score of 6 placing her in 51st percentile.   She was last seen 10/2023 with exertional dyspnea.  Due to hypotension and fatigue olmesartan  reduced from 40 to 20 mg daily.  She was started on Ozempic  for DM2 and weight loss.  Subsequent echocardiogram 11/2023 with normal LVEF 60 to 75%, no RWMA, normal diastolic parameters, RV SF mildly reduced, RV mildly enlarged, trivial MR, aortic valve sclerosis without stenosis, RA pressure 8 mmHg.  She was recommended to continue furosemide  40 mg daily.  Presents today for follow up. Reviewed echocardiogram and prior CT calcium  score. Notes her husband has had recent health difficulties. Feels like she is blowing up with fluid through her abdomen. Notes significant burden caretaking for her husband. She has not yet resumed Ozempic  as directed by primary care earlier this week - she self discontinued as she was having soreness in her throat and difficulty swallowing and was concerned as she had heard reports of it causing throat cancer. CT soft tissue neck was unremarkable. She notes 4-5 weeks ago woke up with difficulties with her left knee despite cortisone injection with orthopedics. Reports more recent sensation of something popping in her left knee and limiting mobility. Recently has required adjustment of her thyroid  medication by primary care provider.   ROS: Please see the history of  present illness.    All other systems reviewed and are negative.   Studies Reviewed: .        Cardiac Studies & Procedures   ______________________________________________________________________________________________     ECHOCARDIOGRAM  ECHOCARDIOGRAM COMPLETE 11/28/2023  Narrative ECHOCARDIOGRAM REPORT    Patient Name:   Kristina Webb Date of Exam: 11/28/2023 Medical Rec #:  [id number]    Height:       63.0 in Accession #:    [id number]   Weight:       221.0 lb Date of Birth:  February 20, 1956   BSA:          2.017 m Patient Age:    67 years     BP:           106/72 mmHg Patient Gender: F            HR:           71 bpm. Exam Location:  Outpatient  Procedure: 2D Echo, Cardiac Doppler, Color Doppler and Intracardiac Opacification Agent (Both Spectral and Color Flow Doppler were utilized during procedure).  Indications:    R06.9 DOE; R60.0 Lower extremity edema  History:        Patient has no prior history of Echocardiogram examinations. Arrythmias:RBBB, Signs/Symptoms:Dyspnea and Edema; Risk Factors:Hypertension, Dyslipidemia, Sleep Apnea, Diabetes, CKD stage 3 and Non-Smoker. Patient denies chest pain. she does have DOE with leg edema.  Sonographer:    Annabella Cater RVT, RDCS (AE), RDMS Referring Phys: [phone] Kay Ricciuti S Humberto Addo  IMPRESSIONS   1. Left ventricular ejection fraction, by estimation, is 60 to  65%. The left ventricle has normal function. The left ventricle has no regional wall motion abnormalities. Left ventricular diastolic parameters were normal. 2. Right ventricular systolic function is mildly reduced. The right ventricular size is mildly enlarged. 3. The mitral valve is normal in structure. Trivial mitral valve regurgitation. No evidence of mitral stenosis. 4. The aortic valve is tricuspid. There is mild calcification of the aortic valve. There is mild thickening of the aortic valve. Aortic valve regurgitation is not visualized. Aortic valve sclerosis is  present, with no evidence of aortic valve stenosis. 5. The inferior vena cava is dilated in size with >50% respiratory variability, suggesting right atrial pressure of 8 mmHg.  FINDINGS Left Ventricle: Left ventricular ejection fraction, by estimation, is 60 to 65%. The left ventricle has normal function. The left ventricle has no regional wall motion abnormalities. Definity  contrast agent was given IV to delineate the left ventricular endocardial borders. Strain was performed and the global longitudinal strain is indeterminate. The left ventricular internal cavity size was normal in size. There is no left ventricular hypertrophy. Left ventricular diastolic parameters were normal.  Right Ventricle: The right ventricular size is mildly enlarged. No increase in right ventricular wall thickness. Right ventricular systolic function is mildly reduced.  Left Atrium: Left atrial size was normal in size.  Right Atrium: Right atrial size was normal in size.  Pericardium: There is no evidence of pericardial effusion.  Mitral Valve: The mitral valve is normal in structure. Trivial mitral valve regurgitation. No evidence of mitral valve stenosis.  Tricuspid Valve: The tricuspid valve is normal in structure. Tricuspid valve regurgitation is not demonstrated. No evidence of tricuspid stenosis.  Aortic Valve: The aortic valve is tricuspid. There is mild calcification of the aortic valve. There is mild thickening of the aortic valve. Aortic valve regurgitation is not visualized. Aortic valve sclerosis is present, with no evidence of aortic valve stenosis. Aortic valve mean gradient measures 4.0 mmHg. Aortic valve peak gradient measures 8.5 mmHg. Aortic valve area, by VTI measures 2.20 cm.  Pulmonic Valve: The pulmonic valve was normal in structure. Pulmonic valve regurgitation is not visualized. No evidence of pulmonic stenosis.  Aorta: The aortic root is normal in size and structure.  Venous: The  inferior vena cava is dilated in size with greater than 50% respiratory variability, suggesting right atrial pressure of 8 mmHg.  IAS/Shunts: No atrial level shunt detected by color flow Doppler.  Additional Comments: 3D was performed not requiring image post processing on an independent workstation and was indeterminate.   LEFT VENTRICLE PLAX 2D LVIDd:         4.98 cm   Diastology LVIDs:         2.65 cm   LV e' medial:    4.39 cm/s LV PW:         1.07 cm   LV E/e' medial:  14.6 LV IVS:        0.78 cm   LV e' lateral:   8.10 cm/s LVOT diam:     1.80 cm   LV E/e' lateral: 7.9 LV SV:         55 LV SV Index:   27 LVOT Area:     2.54 cm   RIGHT VENTRICLE RV S prime:     13.10 cm/s TAPSE (M-mode): 2.2 cm  LEFT ATRIUM             Index        RIGHT ATRIUM  Index LA diam:        3.40 cm 1.69 cm/m   RA Area:     13.20 cm LA Vol (A2C):   53.1 ml 26.32 ml/m  RA Volume:   29.40 ml  14.57 ml/m LA Vol (A4C):   50.4 ml 24.98 ml/m LA Biplane Vol: 52.6 ml 26.07 ml/m AORTIC VALVE                    PULMONIC VALVE AV Area (Vmax):    2.06 cm     PV Vmax:       1.06 m/s AV Area (Vmean):   1.99 cm     PV Peak grad:  4.5 mmHg AV Area (VTI):     2.20 cm AV Vmax:           146.00 cm/s AV Vmean:          94.500 cm/s AV VTI:            0.249 m AV Peak Grad:      8.5 mmHg AV Mean Grad:      4.0 mmHg LVOT Vmax:         118.00 cm/s LVOT Vmean:        73.800 cm/s LVOT VTI:          0.215 m LVOT/AV VTI ratio: 0.86  AORTA Ao Root diam: 3.10 cm Ao Asc diam:  3.10 cm Ao Arch diam: 3.3 cm  MITRAL VALVE               TRICUSPID VALVE MV Area (PHT): 3.11 cm    TR Peak grad:   19.9 mmHg MV Decel Time: 244 msec    TR Vmax:        223.00 cm/s MV E velocity: 64.30 cm/s MV A velocity: 83.40 cm/s  SHUNTS MV E/A ratio:  0.77        Systemic VTI:  0.22 m Systemic Diam: 1.80 cm  Maude Emmer MD Electronically signed by Maude Emmer MD Signature Date/Time: 11/28/2023/4:03:13  PM    Final      CT SCANS  CT CORONARY MORPH W/CTA COR W/SCORE 05/26/2020  Addendum 05/26/2020  5:32 PM ADDENDUM REPORT: 05/26/2020 17:30  HISTORY: Chest pain, nonspecific  EXAM: Cardiac/Coronary CT  TECHNIQUE: The patient was scanned on a [employer].  PROTOCOL: A 120 kV prospective scan was triggered in the descending thoracic aorta at 111 HU's. Axial non-contrast 3 mm slices were carried out through the heart. The data set was analyzed on a dedicated work station and scored using the Agatson method. Gantry rotation speed was 250 msecs and collimation was 0.6 mm. Beta blockade and 0.8 mg of sl NTG was given. The 3D data set was reconstructed in 5% intervals of 35-75% of the R-R cycle. Diastolic phases were analyzed on a dedicated work station using MPR, MIP and VRT modes. The patient received 80mL OMNIPAQUE  IOHEXOL  350 MG/ML SOLN of contrast.  FINDINGS: Coronary calcium  score: The patient's coronary artery calcium  score is 6, which places the patient in the 51 percentile.  Coronary arteries: Normal coronary origins.  Left dominance.  Right Coronary Artery: Small caliber, nondominant vessel. No significant plaque or stenosis.  Left Main Coronary Artery: Normal caliber vessel. No significant plaque or stenosis. Small ramus intermedius without significant plaque or stenosis.  Left Anterior Descending Coronary Artery: Normal caliber vessel. Small focus of calcified plaque in the proximal LAD without any stenosis. Gives rise to 1 small diagonal branch.  Left  Circumflex Artery: Normal caliber vessel. No significant plaque or stenosis. Gives rise to 1 large OM branch.  Aorta: Normal size, 30 mm at the mid ascending aorta (level of the PA bifurcation) measured double oblique. No calcifications. No dissection.  Aortic Valve: No calcifications. Trileaflet.  Other findings:  Normal pulmonary vein drainage into the left atrium.  Normal left atrial  appendage without a thrombus.  Normal size of the pulmonary artery.  IMPRESSION: 1. No evidence of CAD, CADRADS = 0. There is a single focus of calcium  in the proximal LAD, but it does not obstruct the lumen at all.  2. Coronary calcium  score of 6. This was 19 percentile for age and sex matched control.  3. Normal coronary origin with left dominance.   Electronically Signed By: Shelda Bruckner M.D. On: 05/26/2020 17:30  Narrative EXAM: OVER-READ INTERPRETATION  CT CHEST  The following report is an over-read performed by radiologist Dr. Franky Crease of [facility] on 05/26/2020. This over-read does not include interpretation of cardiac or coronary anatomy or pathology. The coronary CTA interpretation by the cardiologist is attached.  COMPARISON:  None.  FINDINGS: Vascular: Heart is normal size.  Aorta normal caliber.  Mediastinum/Nodes: No adenopathy.  Lungs/Pleura: No confluent opacities or effusions.  Upper Abdomen: Imaging into the upper abdomen demonstrates no acute findings.  Musculoskeletal: Chest wall soft tissues are unremarkable. No acute bony abnormality.  IMPRESSION: No acute or significant extracardiac abnormality.  Electronically Signed: By: Franky Crease M.D. On: 05/26/2020 12:21     ______________________________________________________________________________________________        Risk Assessment/Calculations:            Physical Exam:   VS:  BP 130/78   Pulse (!) 54   Ht 5' 3 (1.6 m)   Wt 242 lb (109.8 kg)   SpO2 96%   BMI 42.87 kg/m    Wt Readings from Last 3 Encounters:  04/23/24 242 lb (109.8 kg)  04/19/24 233 lb (105.7 kg)  04/01/24 230 lb (104.3 kg)    GEN: Well nourished, well developed in no acute distress NECK: No JVD; No carotid bruits CARDIAC: RRR, no murmurs, rubs, gallops RESPIRATORY:  Clear to auscultation without rales, wheezing or rhonchi  ABDOMEN: Soft, non-tender,  non-distended EXTREMITIES:  No edema; No deformity   ASSESSMENT AND PLAN: .    Coronary calcification on CT / HLD, LDL goal <70 - 05/2020 calcium  score of 6. No chest discomfort. No indication for ischemic evaluation.  GDMT Rosuvastatin  10mg  daily, Bisoprolol  10mg  daily, Aspirin  EC 81mg  daily. Recommend aiming for 150 minutes of moderate intensity activity per week and following a heart healthy diet.   HTN - BP well controlled. Continue current antihypertensive regimen Olmesartan  40mg  daily, Bisoprolol  10mg  daily. Discussed to monitor BP at home at least 2 hours after medications and sitting for 5-10 minutes.   DM2 / Obesity - Continue to follow with PCP. Weight loss via diet and exercise encouraged. Discussed the impact being overweight would have on cardiovascular risk.   Hypothroidism - recently elevated thyroid  requiring adustment of levothyroxine . Suspect hypothyroidism contributory to fatigue, weight gain.  DOE - Echo 11/2023 LVEF 60-65%, normal diastolic parameters, RVSF mildly reduced, RV mildly enlarged, trivial MR. Reports persistent DOE and abdominal bloating. Suspect physical deconditioning, recent weight gain, hypothyroidism contributory. However, to rule out symptomatic  significant volume overload will increase Lasix  to 80mg  daily x 3 days then return to 40mg  daily. MyChart check in Tuesday. If no significant improvement with increased dose  Lasix , could consider trial of Torsemide.        Dispo: follow up in 4 months  Signed, Reche GORMAN Finder, NP

## 2024-04-23 NOTE — Progress Notes (Signed)
 Previous ear drops not covered.

## 2024-04-23 NOTE — Patient Instructions (Addendum)
 Medication Instructions:   PLEASE INCREASE YOUR LASIX  TO 80 MG BY MOUTH DAILY FOR 3 DAYS ONLY, THEN DECREASE BACK TO TAKING 40 MG BY MOUTH DAILY THEREAFTER  *If you need a refill on your cardiac medications before your next appointment, please call your pharmacy*    Follow-Up:  4 MONTHS WITH DR. LONNI OR CAITLIN WALKER, NP

## 2024-04-24 ENCOUNTER — Encounter (HOSPITAL_BASED_OUTPATIENT_CLINIC_OR_DEPARTMENT_OTHER): Payer: Self-pay

## 2024-04-27 ENCOUNTER — Ambulatory Visit (HOSPITAL_BASED_OUTPATIENT_CLINIC_OR_DEPARTMENT_OTHER): Admitting: Physician Assistant

## 2024-04-27 ENCOUNTER — Encounter (HOSPITAL_BASED_OUTPATIENT_CLINIC_OR_DEPARTMENT_OTHER): Payer: Self-pay | Admitting: Physician Assistant

## 2024-04-27 ENCOUNTER — Ambulatory Visit (HOSPITAL_BASED_OUTPATIENT_CLINIC_OR_DEPARTMENT_OTHER)

## 2024-04-27 ENCOUNTER — Other Ambulatory Visit (HOSPITAL_BASED_OUTPATIENT_CLINIC_OR_DEPARTMENT_OTHER): Payer: Self-pay | Admitting: Physician Assistant

## 2024-04-27 DIAGNOSIS — M25562 Pain in left knee: Secondary | ICD-10-CM

## 2024-04-27 MED ORDER — TRAMADOL HCL 50 MG PO TABS: 50.0000 mg | ORAL_TABLET | Freq: Four times a day (QID) | ORAL | 0 refills | Status: DC | PRN

## 2024-04-27 NOTE — Progress Notes (Signed)
 Office Visit Note   Patient: Kristina Webb           Date of Birth: 1955-09-12           MRN: [id number] Visit Date: 04/27/2024              Requested by: Kayla Jeoffrey RAMAN, FNP [address] [place],  [place] [address] PCP: Kayla Jeoffrey RAMAN, FNP   Assessment & Plan: Visit Diagnoses:  1. Acute pain of left knee     Plan: Keene is a pleasant 68 year old woman with a 6-week history of left medial knee pain.  She said the knee has hurt she has no particular injury but just noticed getting out of bed and has feelings of locking catching and instability.  She was originally seen by a provider at [facility] and told she had arthritis by x-ray.  She was given an injection.  She personally did not think this lasted very long.  She Eilene presented to the emergency room with continued pain x-rays did not demonstrate any acute Ossey abnormalities no significant arthrosis.  She comes in today because over the weekend she was lifting some groceries and again had a significant return of pain she does not trust her knee is currently using a knee immobilizer.  She would like to get an MRI.  Will go forward and order an MRI given her symptoms to rule out a medial meniscus tear.  She is asking for something for pain she has been taking her husband's hydrocodone .  She does not have a history of seizure I will give her a small amount of tramadol .  Unfortunately she cannot take anti-inflammatories because of her kidneys and she is already maxing out on Tylenol .  Would have her follow-up with one of our surgeons if she indeed has meniscus tear or if arthritis is worse than what x-ray demonstrates  Follow-Up Instructions: Return if symptoms worsen or fail to improve.   Orders:  Orders Placed This Encounter  Procedures   MR Knee Left  Wo Contrast   No orders of the defined types were placed in this encounter.     Procedures: No procedures performed   Clinical Data: No additional  findings.   Subjective: Chief Complaint  Patient presents with   Left Knee - Pain    HPI patient is a 68 year old woman who comes in today with a chief complaint of left knee pain.  She had a fall over the weekend.  She says it feels like it is going to give out on her when she is walking.  Describes the pain as throbbing.  She has had trouble with the knee before this.  She was having trouble with her knee and in July was given a steroid injection by another provider.  She said this did help however she had a twisting injury in the shower and was seen in the emergency room in August because the pain had returned  Review of Systems  All other systems reviewed and are negative.    Objective: Vital Signs: There were no vitals taken for this visit.  Physical Exam Constitutional:      Appearance: Normal appearance.  Pulmonary:     Effort: Pulmonary effort is normal.  Skin:    General: Skin is warm and dry.  Neurological:     General: No focal deficit present.     Mental Status: She is alert and oriented to person, place, and time.  Psychiatric:  Mood and Affect: Mood normal.        Behavior: Behavior normal.     Ortho Exam Examination of her left knee she has no effusion no erythema compartments are soft and compressible she is neurovascular intact he has good extension flexion.  Fairly good stability noted no varus or valgus instability she does have some tenderness over the medial medial posterior joint line.  No evidence of infection Specialty Comments:  No specialty comments available.  Imaging: No results found.   PMFS History: Patient Active Problem List   Diagnosis Date Noted   Physical exam, annual 04/19/2024   Acute otitis externa of left ear 04/19/2024   Esophageal dysphagia 03/03/2024   Diabetes mellitus treated with injections of non-insulin  medication (HCC) 12/17/2023   Coronary artery disease of native artery of native heart with stable angina  pectoris (HCC) 12/17/2023   History of dysplastic nevus 09/26/2021   Iron deficiency 01/26/2021   Hypothyroidism    RBBB 01/25/2021   Cough variant asthma 01/18/2021   Insomnia due to other mental disorder 09/18/2020   Severe obstructive sleep apnea-hypopnea syndrome 09/18/2020   Exposure to second hand tobacco smoke 09/18/2020   Family history of heart disease 05/09/2020   Obesity hypoventilation syndrome (HCC) 08/05/2019   Lower back pain 07/17/2015   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   CKD stage 3 due to type 2 diabetes mellitus (HCC) 04/15/2014   Hyperlipidemia associated with type 2 diabetes mellitus (HCC)    Type 2 diabetes mellitus (HCC)    Asthma    Major depression in partial remission (HCC)    Hypertension    GERD (gastroesophageal reflux disease)    Cough 12/28/2012   Past Medical History:  Diagnosis Date   Anemia    hx   Anxiety    Arthritis    back   Asthma    Depression    Deviated septum    GERD (gastroesophageal reflux disease)    Headache    History of hiatal hernia    Hyperlipemia    Hypertension    Hypothyroidism    OSA (obstructive sleep apnea)    does not use -last 6 months   Pre-diabetes    per pt    Family History  Problem Relation Age of Onset   Emphysema Mother        smoked   Allergies Mother    Asthma Mother    Heart disease Mother    Breast cancer Mother        with mets to Bone   Prostate cancer Father    Brain cancer Father    Hypertension Brother    Thyroid  disease Brother    Rheumatic fever Brother    Heart disease Brother    Hypertension Brother    Colon cancer Paternal Grandmother    Transient ischemic attack Sister    Heart disease Sister    Allergic rhinitis Neg Hx    Angioedema Neg Hx    Eczema Neg Hx    Immunodeficiency Neg Hx    Urticaria Neg Hx     Past Surgical History:  Procedure Laterality Date   ANKLE GANGLION CYST EXCISION Left    BACK SURGERY     L3-4 decompression   birth mark treatments      BREAST LUMPECTOMY WITH RADIOACTIVE SEED LOCALIZATION Left 02/02/2016   Procedure: LEFT BREAST LUMPECTOMY WITH RADIOACTIVE SEED LOCALIZATION;  Surgeon: Deward Null III, MD;  Location: [facility] OR;  Service: General;  Laterality: Left;   BREAST  SURGERY Right    papilloma removed,cyst lft   CATARACT EXTRACTION Right 12/2016   Dr. Leora   CHOLECYSTECTOMY N/A 02/20/2017   Procedure: LAPAROSCOPIC CHOLECYSTECTOMY WITH INTRAOPERATIVE CHOLANGIOGRAM;  Surgeon: Curvin Deward MOULD, MD;  Location[facility];  Service: General;  Laterality: N/A;   COLON SURGERY  2008   resection- endometriosis   Social History   Occupational History   Occupation: retired  Tobacco Use   Smoking status: Never   Smokeless tobacco: Never   Tobacco comments:    husband smokes in the house  Vaping Use   Vaping status: Never Used  Substance and Sexual Activity   Alcohol use: No   Drug use: No   Sexual activity: Yes    Partners: Male    Birth control/protection: None, Post-menopausal

## 2024-04-29 ENCOUNTER — Inpatient Hospital Stay
Admission: RE | Admit: 2024-04-29 | Discharge: 2024-04-29 | Discharge status: Home / Self Care | Source: Ambulatory Visit | Attending: Physician Assistant

## 2024-04-29 DIAGNOSIS — M25562 Pain in left knee: Secondary | ICD-10-CM

## 2024-05-09 ENCOUNTER — Other Ambulatory Visit (HOSPITAL_BASED_OUTPATIENT_CLINIC_OR_DEPARTMENT_OTHER): Payer: Self-pay | Admitting: Physician Assistant

## 2024-05-09 ENCOUNTER — Encounter: Payer: Self-pay | Admitting: Family Medicine

## 2024-05-10 ENCOUNTER — Other Ambulatory Visit: Payer: Self-pay

## 2024-05-10 DIAGNOSIS — R059 Cough, unspecified: Secondary | ICD-10-CM

## 2024-05-10 DIAGNOSIS — J4541 Moderate persistent asthma with (acute) exacerbation: Secondary | ICD-10-CM

## 2024-05-10 MED ORDER — MONTELUKAST SODIUM 10 MG PO TABS: ORAL_TABLET | ORAL | 1 refills | Status: AC

## 2024-05-13 ENCOUNTER — Ambulatory Visit: Admitting: Gastroenterology

## 2024-05-19 ENCOUNTER — Ambulatory Visit (HOSPITAL_BASED_OUTPATIENT_CLINIC_OR_DEPARTMENT_OTHER): Admitting: Orthopaedic Surgery

## 2024-05-24 ENCOUNTER — Ambulatory Visit (INDEPENDENT_AMBULATORY_CARE_PROVIDER_SITE_OTHER)

## 2024-05-24 DIAGNOSIS — Z23 Encounter for immunization: Secondary | ICD-10-CM

## 2024-05-24 DIAGNOSIS — J4541 Moderate persistent asthma with (acute) exacerbation: Secondary | ICD-10-CM

## 2024-05-24 NOTE — Progress Notes (Signed)
 Patient is in office today for a nurse visit for Immunization. Patient Injection was given in the  Left deltoid. Patient tolerated injection well.

## 2024-05-30 ENCOUNTER — Encounter: Payer: Self-pay | Admitting: Family Medicine

## 2024-05-31 ENCOUNTER — Encounter: Payer: Self-pay | Admitting: Family Medicine

## 2024-05-31 ENCOUNTER — Ambulatory Visit (INDEPENDENT_AMBULATORY_CARE_PROVIDER_SITE_OTHER): Admitting: Family Medicine

## 2024-05-31 ENCOUNTER — Other Ambulatory Visit: Payer: Self-pay

## 2024-05-31 ENCOUNTER — Telehealth: Payer: Self-pay

## 2024-05-31 VITALS — BP 121/80 | HR 69 | Temp 98.2°F | Ht 63.0 in | Wt 232.6 lb

## 2024-05-31 DIAGNOSIS — E039 Hypothyroidism, unspecified: Secondary | ICD-10-CM | POA: Diagnosis not present

## 2024-05-31 DIAGNOSIS — E559 Vitamin D deficiency, unspecified: Secondary | ICD-10-CM | POA: Diagnosis not present

## 2024-05-31 DIAGNOSIS — E1122 Type 2 diabetes mellitus with diabetic chronic kidney disease: Secondary | ICD-10-CM

## 2024-05-31 DIAGNOSIS — E119 Type 2 diabetes mellitus without complications: Secondary | ICD-10-CM

## 2024-05-31 DIAGNOSIS — K2100 Gastro-esophageal reflux disease with esophagitis, without bleeding: Secondary | ICD-10-CM | POA: Diagnosis not present

## 2024-05-31 DIAGNOSIS — Z7985 Long-term (current) use of injectable non-insulin antidiabetic drugs: Secondary | ICD-10-CM

## 2024-05-31 DIAGNOSIS — N182 Chronic kidney disease, stage 2 (mild): Secondary | ICD-10-CM

## 2024-05-31 MED ORDER — PANTOPRAZOLE SODIUM 40 MG PO TBEC: DELAYED_RELEASE_TABLET | ORAL | 1 refills | Status: DC

## 2024-05-31 MED ORDER — VITAMIN D (ERGOCALCIFEROL) 1.25 MG (50000 UNIT) PO CAPS: ORAL_CAPSULE | ORAL | 2 refills | Status: AC

## 2024-05-31 MED ORDER — FREESTYLE LITE TEST VI STRP
ORAL_STRIP | 2 refills | Status: DC
Start: 2024-05-31 — End: 2024-06-01

## 2024-05-31 NOTE — Assessment & Plan Note (Addendum)
 Symptoms improved on pantoprazole  40mg  daily. Scheduled with GI for endoscopy  Elevated HOB if needed and avoid lying down 2-3 hours after eating, avoid coffee, alcohol, chocolate, fatty foods, citrus, carbonated beverages, spicy foods, late meals, and smoking. Return to office if symptoms return or worsen and seek medical care for difficulty swallowing, bleeding, anemia, weight loss, or recurrent vomiting.

## 2024-05-31 NOTE — Telephone Encounter (Signed)
 Completed refill form for Ozempic  and faxed to provider's office for review and signature.

## 2024-05-31 NOTE — Progress Notes (Signed)
 Subjective:  HPI: Kristina Webb is a 68 y.o. female presenting on 05/31/2024 for Medical Management of Chronic Issues (6 wk f/u )   HPI Patient is in today for follow-up for diabetes and hypothyroidism.   DM2: Last A1c was 7.8%. Restarted Ozempic  and is taking 1mg  weekly currently and is tolerating well, not checking BG Denies polyuria, polydypsia, chest pain, paresthesias, or chest pain.  Hypothyroidism: familial, last TSH 9.10, synthroid  increased to 100mcg daily Endorses fatigue and constipation Denies cold or heat intolerance, weight gain or loss, diarrhea, palpitations, lower extremity edema, anxiety/depression  Vitamin D  deficiency: taking 50,000 units twice weekly, last Vit D level 90  Review of Systems  All other systems reviewed and are negative.   Relevant past medical history reviewed and updated as indicated.   Past Medical History:  Diagnosis Date   Anemia    hx   Anxiety    Arthritis    back   Asthma    Depression    Deviated septum    GERD (gastroesophageal reflux disease)    Headache    History of hiatal hernia    Hyperlipemia    Hypertension    Hypothyroidism    OSA (obstructive sleep apnea)    does not use -last 6 months   Pre-diabetes    per pt     Past Surgical History:  Procedure Laterality Date   ANKLE GANGLION CYST EXCISION Left    BACK SURGERY     L3-4 decompression   birth mark treatments     BREAST LUMPECTOMY WITH RADIOACTIVE SEED LOCALIZATION Left 02/02/2016   Procedure: LEFT BREAST LUMPECTOMY WITH RADIOACTIVE SEED LOCALIZATION;  Surgeon: Deward Null III, MD;  Location: [facility] OR;  Service: General;  Laterality: Left;   BREAST SURGERY Right    papilloma removed,cyst lft   CATARACT EXTRACTION Right 12/2016   Dr. Leora   CHOLECYSTECTOMY N/A 02/20/2017   Procedure: LAPAROSCOPIC CHOLECYSTECTOMY WITH INTRAOPERATIVE CHOLANGIOGRAM;  Surgeon: Null Deward MOULD, MD;  Location: [facility] OR;  Service: General;  Laterality: N/A;   COLON SURGERY  2008    resection- endometriosis    Allergies and medications reviewed and updated.   Current Outpatient Medications:    acetaminophen  (TYLENOL ) 500 MG tablet, Take 1,000 mg by mouth every 6 (six) hours as needed for mild pain, moderate pain or headache., Disp: , Rfl:    albuterol  (VENTOLIN  HFA) 108 (90 Base) MCG/ACT inhaler, Inhale 2 puffs into the lungs every 4 (four) hours as needed for wheezing or shortness of breath., Disp: 8.5 each, Rfl: 1   amitriptyline  (ELAVIL ) 10 MG tablet, Take 1 tablet (10 mg total) by mouth at bedtime as needed., Disp: 90 tablet, Rfl: 3   aspirin  EC 81 MG tablet, Take 1 tablet (81 mg total) by mouth daily. Swallow whole., Disp: 90 tablet, Rfl: 3   bisoprolol  (ZEBETA ) 10 MG tablet, TAKE 1 TABLET BY MOUTH EVERY DAY FOR BLOOD PRESSURE, Disp: 90 tablet, Rfl: 3   Budeson-Glycopyrrol-Formoterol  (BREZTRI  AEROSPHERE) 160-9-4.8 MCG/ACT AERO, Inhale 2 puffs into the lungs in the morning and at bedtime. Rinse mouth/gargle after each use., Disp: 5.9 g, Rfl: 2   clotrimazole -betamethasone  (LOTRISONE ) cream, Apply 1 Application topically 2 (two) times daily., Disp: 15 g, Rfl: 2   estradiol  (ESTRACE ) 0.1 MG/GM vaginal cream, as needed., Disp: , Rfl:    ferrous sulfate  325 (65 FE) MG tablet, Take 1 tablet (325 mg total) by mouth daily., Disp: 90 tablet, Rfl: 0   fluticasone -salmeterol (ADVAIR DISKUS) 100-50 MCG/ACT AEPB, Inhale  into the lungs., Disp: , Rfl:    furosemide  (LASIX ) 40 MG tablet, TAKE 1 TABLET DAILY FOR BLOOD PRESSURE & FLUID RETENTION / ANKLE SWELLING, Disp: 90 tablet, Rfl: 3   ipratropium (ATROVENT ) 0.03 % nasal spray, PLACE 2 SPRAYS INTO THE NOSE 3 (THREE) TIMES DAILY., Disp: 90 mL, Rfl: 1   Lancets MISC, Use to check blood sugar once daily. Dx: E11.22, N18.2, Disp: 100 each, Rfl: 2   levothyroxine  (SYNTHROID ) 100 MCG tablet, Take 1 tablet (100 mcg total) by mouth daily before breakfast. TAKE 1 TABLET DAILY ON AN EMPTY STOMACH WITH ONLY WATER FOR 30 MINUTES & NO ANTACID  MEDS, CALCIUM  OR MAGNESIUM FOR 4 HOURS & AVOID BIOTIN, Disp: 30 tablet, Rfl: 1   methocarbamol  (ROBAXIN ) 500 MG tablet, Take 1 tablet (500 mg total) by mouth every 8 (eight) hours as needed for muscle spasms., Disp: 60 tablet, Rfl: 0   montelukast  (SINGULAIR ) 10 MG tablet, TAKE 1 TABLET BY MOUTH DAILY FOR ALLERGIES, Disp: 90 tablet, Rfl: 1   NEOMYCIN -POLYMYXIN-HYDROCORTISONE  (CORTISPORIN) 1 % SOLN OTIC solution, Place 3 drops into both ears every 6 (six) hours., Disp: 10 mL, Rfl: 1   olmesartan  (BENICAR ) 40 MG tablet, Take 1 tablet (40 mg total) by mouth daily. TAKE 1 TABLET DAILY FOR BLOOD PRESSURE & DIABETIC KIDNEY PROTECTION, Disp: 90 tablet, Rfl: 1   rosuvastatin  (CRESTOR ) 10 MG tablet, TAKE 1 TABLET BY MOUTH 3 TIMES A WEEK FOR CHOLESTEROL, Disp: 36 tablet, Rfl: 3   Semaglutide ,0.25 or 0.5MG /DOS, (OZEMPIC , 0.25 OR 0.5 MG/DOSE,) 2 MG/1.5ML SOPN, INJECT 0.25 MG WEEKLY FOR 4 WEEKS THEN INCREASE TO 0.5 MG WEEKLY FOR 4 WEEKS, Disp: , Rfl:    sertraline  (ZOLOFT ) 100 MG tablet, TAKE 1 TABLET DAILY FOR DEPRESSION/ANXIETY, Disp: 90 tablet, Rfl: 3   sertraline  (ZOLOFT ) 25 MG tablet, TAKE 1 TABLET (25 MG TOTAL) BY MOUTH DAILY., Disp: 90 tablet, Rfl: 2   Vitamin D , Ergocalciferol , (DRISDOL ) 1.25 MG (50000 UNIT) CAPS capsule, TAKE 1 CAPSULE BY MOUTH 2 DAYS A WEEK, Disp: 24 capsule, Rfl: 2   glucose blood (FREESTYLE LITE) test strip, Use to check blood sugar once daily. Dx: E11.22, N18.2, Disp: 100 strip, Rfl: 2   pantoprazole  (PROTONIX ) 40 MG tablet, Take 1 tablet Daily for Heartburn & Indigestion, Disp: 90 tablet, Rfl: 1   traMADol  (ULTRAM ) 50 MG tablet, Take 1 tablet (50 mg total) by mouth every 6 (six) hours as needed., Disp: 20 tablet, Rfl: 0  Allergies  Allergen Reactions   Levaquin [Levofloxacin In D5w] Other (See Comments)    Ankles hurt really bad ? ACHILLES TENDON ?   Doxycycline Other (See Comments)    UNSPECIFIED REACTION    Dust Mite Extract Other (See Comments)    Objective:   BP 121/80    Pulse 69   Temp 98.2 F (36.8 C)   Ht 5' 3 (1.6 m)   Wt 232 lb 9.6 oz (105.5 kg)   SpO2 98%   BMI 41.20 kg/m      05/31/2024    3:04 PM 04/23/2024    1:59 PM 04/19/2024    2:01 PM  Vitals with BMI  Height 5' 3 5' 3 5' 3  Weight 232 lbs 10 oz 242 lbs 233 lbs  BMI 41.21 42.88 41.28  Systolic 121 130 873  Diastolic 80 78 84  Pulse 69 54 71     Physical Exam Vitals and nursing note reviewed.  Constitutional:      Appearance: Normal appearance. She is obese.  HENT:     Head: Normocephalic and atraumatic.  Cardiovascular:     Rate and Rhythm: Normal rate and regular rhythm.     Pulses: Normal pulses.     Heart sounds: Normal heart sounds.  Pulmonary:     Effort: Pulmonary effort is normal.     Breath sounds: Normal breath sounds.  Skin:    General: Skin is warm and dry.         Comments: Port wine stain  Neurological:     General: No focal deficit present.     Mental Status: She is alert and oriented to person, place, and time. Mental status is at baseline.  Psychiatric:        Mood and Affect: Mood normal.        Behavior: Behavior normal.        Thought Content: Thought content normal.        Judgment: Judgment normal.     Assessment & Plan:  Hypothyroidism, unspecified type Assessment & Plan: TSH today. Continue Synthroid  100mcg daily  The correct intake of thyroid  hormone (Levothyroxine , Synthroid ), is on empty stomach first thing in the morning, with water, separated by at least 30 minutes from breakfast and other medications,  and separated by more than 4 hours from calcium , iron, multivitamins, acid reflux medications (PPIs).   - This medication is a life-long medication and will be needed to correct thyroid  hormone imbalances for the rest of your life.  The dose may change from time to time, based on thyroid  blood work.   - It is extremely important to be consistent taking this medication, near the same time each morning.   -AVOID TAKING PRODUCTS  CONTAINING BIOTIN (commonly found in Hair, Skin, Nails vitamins) AS IT INTERFERES WITH THE VALIDITY OF THYROID  FUNCTION BLOOD TESTS.   Orders: -     TSH  Vitamin D  deficiency Assessment & Plan: Vitamin D  level today. She is taking 50,000 units twice weekly currently.   Orders: -     VITAMIN D  25 Hydroxy (Vit-D Deficiency, Fractures)  Gastroesophageal reflux disease with esophagitis, unspecified whether hemorrhage Assessment & Plan: Symptoms improved on pantoprazole  40mg  daily. Scheduled with GI for endoscopy  Elevated HOB if needed and avoid lying down 2-3 hours after eating, avoid coffee, alcohol, chocolate, fatty foods, citrus, carbonated beverages, spicy foods, late meals, and smoking. Return to office if symptoms return or worsen and seek medical care for difficulty swallowing, bleeding, anemia, weight loss, or recurrent vomiting.    Orders: -     Pantoprazole  Sodium; Take 1 tablet Daily for Heartburn & Indigestion  Dispense: 90 tablet; Refill: 1  Type 2 diabetes mellitus with stage 2 chronic kidney disease, without long-term current use of insulin  (HCC) Assessment & Plan: Continue Ozempic  1mg  daily. Reports today some mild reflux, tolerating well otherwise. Encouraged to monitor BG. Will return in 2 months for 3 month A1c check.   Orders: -     FreeStyle Lite Test; Use to check blood sugar once daily. Dx: E11.22, N18.2  Dispense: 100 strip; Refill: 2     Follow up plan: Return in about 3 months (around 08/31/2024) for chronic follow-up with labs 1 week prior.  Jeoffrey GORMAN Barrio, FNP

## 2024-05-31 NOTE — Assessment & Plan Note (Signed)
 Vitamin D  level today. She is taking 50,000 units twice weekly currently.

## 2024-05-31 NOTE — Assessment & Plan Note (Signed)
 Continue Ozempic  1mg  daily. Reports today some mild reflux, tolerating well otherwise. Encouraged to monitor BG. Will return in 2 months for 3 month A1c check.

## 2024-05-31 NOTE — Assessment & Plan Note (Signed)
 Continue ozempic  1mg  weekly. A1c and uACR today. Foot exam utd. Vaccines utd. Retinal eye exam utd. Recommend heart healthy diet such as Mediterranean diet with whole grains, fruits, vegetable, fish, lean meats, nuts, and olive oil. Limit salt. Encouraged moderate walking, 3-5 times/week for 30-50 minutes each session. Aim for at least 150 minutes.week. Goal should be pace of 3 miles/hours, or walking 1.5 miles in 30 minutes. Seek medical care for urinary frequency, extreme thirst, vision changes, lightheadedness, dizziness.  Follow up in 3 months

## 2024-05-31 NOTE — Assessment & Plan Note (Signed)
 TSH today. Continue Synthroid  100mcg daily  The correct intake of thyroid  hormone (Levothyroxine , Synthroid ), is on empty stomach first thing in the morning, with water, separated by at least 30 minutes from breakfast and other medications,  and separated by more than 4 hours from calcium , iron, multivitamins, acid reflux medications (PPIs).   - This medication is a life-long medication and will be needed to correct thyroid  hormone imbalances for the rest of your life.  The dose may change from time to time, based on thyroid  blood work.   - It is extremely important to be consistent taking this medication, near the same time each morning.   -AVOID TAKING PRODUCTS CONTAINING BIOTIN (commonly found in Hair, Skin, Nails vitamins) AS IT INTERFERES WITH THE VALIDITY OF THYROID  FUNCTION BLOOD TESTS.

## 2024-06-01 ENCOUNTER — Telehealth: Payer: Self-pay | Admitting: Pharmacy Technician

## 2024-06-01 ENCOUNTER — Other Ambulatory Visit: Payer: Self-pay

## 2024-06-01 ENCOUNTER — Other Ambulatory Visit (HOSPITAL_COMMUNITY): Payer: Self-pay

## 2024-06-01 ENCOUNTER — Ambulatory Visit: Payer: Self-pay | Admitting: Family Medicine

## 2024-06-01 DIAGNOSIS — E1122 Type 2 diabetes mellitus with diabetic chronic kidney disease: Secondary | ICD-10-CM

## 2024-06-01 LAB — VITAMIN D 25 HYDROXY (VIT D DEFICIENCY, FRACTURES): Vit D, 25-Hydroxy: 93 ng/mL (ref 30–100)

## 2024-06-01 LAB — TSH: TSH: 3.16 m[IU]/L (ref 0.40–4.50)

## 2024-06-01 MED ORDER — BLOOD GLUCOSE TEST VI STRP: ORAL_STRIP | 6 refills | Status: AC

## 2024-06-01 MED ORDER — BLOOD GLUCOSE MONITORING SUPPL DEVI: 0 refills | Status: AC

## 2024-06-01 NOTE — Telephone Encounter (Signed)
 Pharmacy Patient Advocate Encounter   Received notification from Onbase that prior authorization for Freestyle Lite Test strips is required/requested.   Insurance verification completed.   The patient is insured through [facility] ADVANTAGE/RX ADVANCE.   Per test claim:  OneTouch brand is preferred by the insurance.  If suggested medication is appropriate, Please send in a new RX and discontinue this one. If not, please advise as to why it's not appropriate so that we may request a Prior Authorization. Please note, some preferred medications may still require a PA.  If the suggested medications have not been trialed and there are no contraindications to their use, the PA will not be submitted, as it will not be approved.  Kristina Webb insurance plan will cover OneTouch brand monitor, test strips, and lancets @ $0.00 copay

## 2024-06-09 ENCOUNTER — Ambulatory Visit (INDEPENDENT_AMBULATORY_CARE_PROVIDER_SITE_OTHER): Admitting: Orthopaedic Surgery

## 2024-06-09 DIAGNOSIS — M25562 Pain in left knee: Secondary | ICD-10-CM | POA: Diagnosis not present

## 2024-06-09 NOTE — Progress Notes (Signed)
 Chief Complaint: Left knee pain     History of Present Illness:   06/09/2024: Presents today for discussion of her left knee as well as MRI discussion and referral from Ronal Dragon Persons  Kristina Webb is a 68 y.o. female who comes in today with a chief complaint of left knee pain.  She had a fall over the weekend.  She says it feels like it is going to give out on her when she is walking.  Describes the pain as throbbing.  She has had trouble with the knee before this.  She was having trouble with her knee and in July was given a steroid injection by another provider.  She said this did help however she had a twisting injury in the shower and was seen in the emergency room in August because the pain had returned     PMH/PSH/Family History/Social History/Meds/Allergies:    Past Medical History:  Diagnosis Date   Anemia    hx   Anxiety    Arthritis    back   Asthma    Depression    Deviated septum    GERD (gastroesophageal reflux disease)    Headache    History of hiatal hernia    Hyperlipemia    Hypertension    Hypothyroidism    OSA (obstructive sleep apnea)    does not use -last 6 months   Pre-diabetes    per pt   Past Surgical History:  Procedure Laterality Date   ANKLE GANGLION CYST EXCISION Left    BACK SURGERY     L3-4 decompression   birth mark treatments     BREAST LUMPECTOMY WITH RADIOACTIVE SEED LOCALIZATION Left 02/02/2016   Procedure: LEFT BREAST LUMPECTOMY WITH RADIOACTIVE SEED LOCALIZATION;  Surgeon: Deward Null III, MD;  Location: [facility] OR;  Service: General;  Laterality: Left;   BREAST SURGERY Right    papilloma removed,cyst lft   CATARACT EXTRACTION Right 12/2016   Dr. Leora   CHOLECYSTECTOMY N/A 02/20/2017   Procedure: LAPAROSCOPIC CHOLECYSTECTOMY WITH INTRAOPERATIVE CHOLANGIOGRAM;  Surgeon: Null Deward MOULD, MD;  Location: [facility] OR;  Service: General;  Laterality: N/A;   COLON SURGERY  2008   resection- endometriosis   Social History   Socioeconomic  History   Marital status: Married    Spouse name: Not on file   Number of children: 0   Years of education: Not on file   Highest education level: 12th grade  Occupational History   Occupation: retired  Tobacco Use   Smoking status: Never   Smokeless tobacco: Never   Tobacco comments:    husband smokes in the house  Vaping Use   Vaping status: Never Used  Substance and Sexual Activity   Alcohol use: No   Drug use: No   Sexual activity: Yes    Partners: Male    Birth control/protection: None, Post-menopausal  Other Topics Concern   Not on file  Social History Narrative   Drinks about 1/2 can soda daily.   Social Drivers of Health   Financial Resource Strain: High Risk (12/17/2023)   Overall Financial Resource Strain (CARDIA)    Difficulty of Paying Living Expenses: Hard  Food Insecurity: No Food Insecurity (12/17/2023)   Hunger Vital Sign    Worried About Running Out of Food in the Last Year: Never true    Ran Out of Food in the Last Year: Never true  Transportation Needs: No Transportation Needs (12/17/2023)   PRAPARE - Transportation    Lack  of Transportation (Medical): No    Lack of Transportation (Non-Medical): No  Physical Activity: Insufficiently Active (12/17/2023)   Exercise Vital Sign    Days of Exercise per Week: 3 days    Minutes of Exercise per Session: 30 min  Stress: Stress Concern Present (12/17/2023)   [employer] of Occupational Health - Occupational Stress Questionnaire    Feeling of Stress : To some extent  Social Connections: Moderately Integrated (12/17/2023)   Social Connection and Isolation Panel    Frequency of Communication with Friends and Family: More than three times a week    Frequency of Social Gatherings with Friends and Family: Three times a week    Attends Religious Services: More than 4 times per year    Active Member of Clubs or Organizations: No    Attends Engineer, structural: Not on file    Marital Status: Married    Family History  Problem Relation Age of Onset   Emphysema Mother        smoked   Allergies Mother    Asthma Mother    Heart disease Mother    Breast cancer Mother        with mets to Bone   Prostate cancer Father    Brain cancer Father    Hypertension Brother    Thyroid  disease Brother    Rheumatic fever Brother    Heart disease Brother    Hypertension Brother    Colon cancer Paternal Grandmother    Transient ischemic attack Sister    Heart disease Sister    Allergic rhinitis Neg Hx    Angioedema Neg Hx    Eczema Neg Hx    Immunodeficiency Neg Hx    Urticaria Neg Hx    Allergies  Allergen Reactions   Levaquin [Levofloxacin In D5w] Other (See Comments)    Ankles hurt really bad ? ACHILLES TENDON ?   Doxycycline Other (See Comments)    UNSPECIFIED REACTION    Dust Mite Extract Other (See Comments)   Current Outpatient Medications  Medication Sig Dispense Refill   Blood Glucose Monitoring Suppl DEVI Use to check blood sugar fasting each morning. May substitute to any manufacturer covered by patient's insurance. Insurance covers One Touch. 1 each 0   Glucose Blood (BLOOD GLUCOSE TEST STRIPS) STRP Use to check blood sugar, fasting each morning. May substitute to any manufacturer covered by patient's insurance. Insurance covers One Touch. 100 strip 6   acetaminophen  (TYLENOL ) 500 MG tablet Take 1,000 mg by mouth every 6 (six) hours as needed for mild pain, moderate pain or headache.     albuterol  (VENTOLIN  HFA) 108 (90 Base) MCG/ACT inhaler Inhale 2 puffs into the lungs every 4 (four) hours as needed for wheezing or shortness of breath. 8.5 each 1   amitriptyline  (ELAVIL ) 10 MG tablet Take 1 tablet (10 mg total) by mouth at bedtime as needed. 90 tablet 3   aspirin  EC 81 MG tablet Take 1 tablet (81 mg total) by mouth daily. Swallow whole. 90 tablet 3   bisoprolol  (ZEBETA ) 10 MG tablet TAKE 1 TABLET BY MOUTH EVERY DAY FOR BLOOD PRESSURE 90 tablet 3    Budeson-Glycopyrrol-Formoterol  (BREZTRI  AEROSPHERE) 160-9-4.8 MCG/ACT AERO Inhale 2 puffs into the lungs in the morning and at bedtime. Rinse mouth/gargle after each use. 5.9 g 2   clotrimazole -betamethasone  (LOTRISONE ) cream Apply 1 Application topically 2 (two) times daily. 15 g 2   estradiol  (ESTRACE ) 0.1 MG/GM vaginal cream as needed.     ferrous  sulfate 325 (65 FE) MG tablet Take 1 tablet (325 mg total) by mouth daily. 90 tablet 0   fluticasone -salmeterol (ADVAIR DISKUS) 100-50 MCG/ACT AEPB Inhale into the lungs.     furosemide  (LASIX ) 40 MG tablet TAKE 1 TABLET DAILY FOR BLOOD PRESSURE & FLUID RETENTION / ANKLE SWELLING 90 tablet 3   ipratropium (ATROVENT ) 0.03 % nasal spray PLACE 2 SPRAYS INTO THE NOSE 3 (THREE) TIMES DAILY. 90 mL 1   Lancets MISC Use to check blood sugar once daily. Dx: E11.22, N18.2 100 each 2   levothyroxine  (SYNTHROID ) 100 MCG tablet Take 1 tablet (100 mcg total) by mouth daily before breakfast. TAKE 1 TABLET DAILY ON AN EMPTY STOMACH WITH ONLY WATER FOR 30 MINUTES & NO ANTACID MEDS, CALCIUM  OR MAGNESIUM FOR 4 HOURS & AVOID BIOTIN 30 tablet 1   methocarbamol  (ROBAXIN ) 500 MG tablet Take 1 tablet (500 mg total) by mouth every 8 (eight) hours as needed for muscle spasms. 60 tablet 0   montelukast  (SINGULAIR ) 10 MG tablet TAKE 1 TABLET BY MOUTH DAILY FOR ALLERGIES 90 tablet 1   NEOMYCIN -POLYMYXIN-HYDROCORTISONE  (CORTISPORIN) 1 % SOLN OTIC solution Place 3 drops into both ears every 6 (six) hours. 10 mL 1   olmesartan  (BENICAR ) 40 MG tablet Take 1 tablet (40 mg total) by mouth daily. TAKE 1 TABLET DAILY FOR BLOOD PRESSURE & DIABETIC KIDNEY PROTECTION 90 tablet 1   pantoprazole  (PROTONIX ) 40 MG tablet Take 1 tablet Daily for Heartburn & Indigestion 90 tablet 1   rosuvastatin  (CRESTOR ) 10 MG tablet TAKE 1 TABLET BY MOUTH 3 TIMES A WEEK FOR CHOLESTEROL 36 tablet 3   Semaglutide ,0.25 or 0.5MG /DOS, (OZEMPIC , 0.25 OR 0.5 MG/DOSE,) 2 MG/1.5ML SOPN INJECT 0.25 MG WEEKLY FOR 4 WEEKS  THEN INCREASE TO 0.5 MG WEEKLY FOR 4 WEEKS     sertraline  (ZOLOFT ) 100 MG tablet TAKE 1 TABLET DAILY FOR DEPRESSION/ANXIETY 90 tablet 3   sertraline  (ZOLOFT ) 25 MG tablet TAKE 1 TABLET (25 MG TOTAL) BY MOUTH DAILY. 90 tablet 2   traMADol  (ULTRAM ) 50 MG tablet Take 1 tablet (50 mg total) by mouth every 6 (six) hours as needed. 20 tablet 0   Vitamin D , Ergocalciferol , (DRISDOL ) 1.25 MG (50000 UNIT) CAPS capsule TAKE 1 CAPSULE BY MOUTH 2 DAYS A WEEK 24 capsule 2   No current facility-administered medications for this visit.   No results found.  Review of Systems:   A ROS was performed including pertinent positives and negatives as documented in the HPI.  Physical Exam :   Constitutional: NAD and appears stated age Neurological: Alert and oriented Psych: Appropriate affect and cooperative There were no vitals taken for this visit.   Comprehensive Musculoskeletal Exam:    Left knee with tenderness predominately about the patellofemoral joint from retropatellar patellar fat pad area.  There is minimal joint line tenderness about the medial joint line with negative McMurray negative Lachman, negative   Imaging:   Xray (4 views left knee): Normal  MRI (MRI left knee): There is near full-thickness cartilage loss involving the patellofemoral joint.  There is a small degenerative medial meniscal tear   I personally reviewed and interpreted the radiographs.   Assessment and Plan:   68 y.o. female with left knee pain which to me seems more consistent today with patellofemoral flareup of osteoarthritis as opposed to a medial meniscal tear.  I did describe that she does not have significant meniscal findings on her exam and as a result I do believe that her most recent injury  and landing on this left knee likely has flared up her arthritis.  She does have a history of rises in her A1c after steroid injections and given this I do believe she would benefit from Zilretta  as there has been evidence  showing that there is a smaller increase in blood sugars following these.  Will plan to apply for this and provide an injection into the left knee  -Return to clinic for left knee Zilretta  injection   I personally saw and evaluated the patient, and participated in the management and treatment plan.  Elspeth Parker, MD Attending Physician, Orthopedic Surgery  This document was dictated using Dragon voice recognition software. A reasonable attempt at proof reading has been made to minimize errors.

## 2024-06-09 NOTE — Addendum Note (Signed)
 Addended by: WOLFGANG CONLEY HERO on: 06/09/2024 03:49 PM   Modules accepted: Orders

## 2024-06-11 ENCOUNTER — Other Ambulatory Visit: Payer: Self-pay | Admitting: Family Medicine

## 2024-06-11 DIAGNOSIS — E039 Hypothyroidism, unspecified: Secondary | ICD-10-CM

## 2024-06-17 ENCOUNTER — Other Ambulatory Visit

## 2024-06-18 ENCOUNTER — Encounter (HOSPITAL_BASED_OUTPATIENT_CLINIC_OR_DEPARTMENT_OTHER): Payer: Self-pay | Admitting: Orthopaedic Surgery

## 2024-06-21 ENCOUNTER — Other Ambulatory Visit: Payer: Self-pay | Admitting: Family Medicine

## 2024-06-21 ENCOUNTER — Encounter: Payer: Self-pay | Admitting: Family Medicine

## 2024-06-21 DIAGNOSIS — E039 Hypothyroidism, unspecified: Secondary | ICD-10-CM

## 2024-06-23 ENCOUNTER — Other Ambulatory Visit: Payer: Self-pay

## 2024-06-23 ENCOUNTER — Other Ambulatory Visit

## 2024-06-28 ENCOUNTER — Encounter: Payer: Self-pay | Admitting: Radiology

## 2024-07-01 ENCOUNTER — Encounter (HOSPITAL_BASED_OUTPATIENT_CLINIC_OR_DEPARTMENT_OTHER): Payer: Self-pay

## 2024-07-07 ENCOUNTER — Encounter: Payer: Self-pay | Admitting: Gastroenterology

## 2024-07-07 ENCOUNTER — Telehealth: Payer: Self-pay

## 2024-07-07 ENCOUNTER — Ambulatory Visit: Admitting: Gastroenterology

## 2024-07-07 VITALS — BP 110/86 | HR 74 | Ht 63.0 in | Wt 224.0 lb

## 2024-07-07 DIAGNOSIS — R112 Nausea with vomiting, unspecified: Secondary | ICD-10-CM | POA: Diagnosis not present

## 2024-07-07 DIAGNOSIS — R1319 Other dysphagia: Secondary | ICD-10-CM

## 2024-07-07 DIAGNOSIS — K219 Gastro-esophageal reflux disease without esophagitis: Secondary | ICD-10-CM | POA: Diagnosis not present

## 2024-07-07 DIAGNOSIS — R1013 Epigastric pain: Secondary | ICD-10-CM

## 2024-07-07 DIAGNOSIS — Z860100 Personal history of colon polyps, unspecified: Secondary | ICD-10-CM

## 2024-07-07 MED ORDER — NA SULFATE-K SULFATE-MG SULF 17.5-3.13-1.6 GM/177ML PO SOLN: 1.0000 | Freq: Once | ORAL | 0 refills | Status: AC

## 2024-07-07 NOTE — Telephone Encounter (Signed)
 [facility] Pre-operative Risk Assessment     Request for surgical clearance:     Endoscopy Procedure  What type of surgery is being performed?     ECL  When is this surgery scheduled?     08/17/24  What type of clearance is required ?   cardiology  Are there any medications that need to be held prior to surgery and how long? No medication. Cardiac clearance for chest pain  Practice name and name of physician performing surgery?      [facility]  What is your office phone and fax number?      Phone- [phone]  Fax- [phone]  Anesthesia type (None, local, MAC, general) ?       MAC   Please route your response to Corean [place], [facility]

## 2024-07-07 NOTE — Progress Notes (Signed)
 Kristina Webb [id number] 10/20/1955   Chief Complaint: Trouble swallowing  Referring Provider: Kayla Jeoffrey RAMAN, FNP Primary GI MD: Dr. Wilhelmenia  HPI: Kristina Webb is a 68 y.o. female with past medical history of anemia, anxiety/depression, arthritis, asthma, GERD, hiatal hernia, HLD, HTN, hypothyroidism, OSA,T2DM, cholecystectomy who presents today for a complaint of dysphagia.    Previous patient of Dr. Luis.  In 2019, patient had a colonoscopy for previous history of adenomas.  No evidence of any adenomas were found. She was recommended a 5-year follow-up due to the previous findings. She also had an upper endoscopy which was unremarkable.  Dr. Wilhelmenia offered to direct schedule a colonoscopy but patient opted to have Cologuard test.  Had a negative Cologuard 12/2023.   Referred by PCP for evaluation of esophageal dysphagia, endoscopy.   Discussed the use of AI scribe software for clinical note transcription with the patient, who gave verbal consent to proceed.  History of Present Illness Kristina Webb is a 68 year old female with type 2 diabetes and gastroesophageal reflux disease who presents with difficulty swallowing and gastrointestinal symptoms potentially related to Ozempic  use. She was referred by her primary care provider due to trouble with swallowing.  Dysphagia and esophageal symptoms - Difficulty swallowing, particularly with bread - Sensation of food getting stuck in the throat - Frequent vomiting while taking Ozempic  - Reflux symptoms worsened with Ozempic  use - Occasionally takes Protonix  twice daily, especially at night - History of severe reflux causing nocturnal awakening with sensation of drowning due to fluid in throat - Steady upper abdominal pain - More frequent chest pain this week  Constipation and bowel habits - Chronic constipation with bowel movements consisting of small 'rabbit balls' - Uses stool softener daily - Worsened constipation  while on Ozempic  - History of difficulty with colonoscopy preparation due to vomiting the prep solution and incomplete bowel cleansing  Metabolic and endocrine symptoms - Type 2 diabetes - Blood sugar levels increased to 7.8, highest recorded, despite Ozempic  use - Weight gain instead of expected weight loss while on Ozempic  - Thyroid  medication dosage increased from 77 mg to 100 mg since starting Ozempic   Colorectal cancer surveillance - History of precancerous polyps removed during previous colonoscopies - Distrust of Cologuard test performed last year due to prior polyps  Respiratory symptoms - History of recurrent bronchial infections every winter, attributed to husband's smoking - Current cough, using Mucinex and cough medicine - No shortness of breath with cough   Previous GI Procedures/Imaging   EGD 10/14/2017 Normal EGD  Colonoscopy 10/14/2017   Past Medical History:  Diagnosis Date   Anemia    hx   Anxiety    Arthritis    back   Asthma    Depression    Deviated septum    GERD (gastroesophageal reflux disease)    Headache    History of hiatal hernia    Hyperlipemia    Hypertension    Hypothyroidism    OSA (obstructive sleep apnea)    does not use -last 6 months   Pre-diabetes    per pt    Past Surgical History:  Procedure Laterality Date   ANKLE GANGLION CYST EXCISION Left    BACK SURGERY     L3-4 decompression   birth mark treatments     BREAST LUMPECTOMY WITH RADIOACTIVE SEED LOCALIZATION Left 02/02/2016   Procedure: LEFT BREAST LUMPECTOMY WITH RADIOACTIVE SEED LOCALIZATION;  Surgeon: Deward Null III, MD;  Location: [facility] OR;  Service: General;  Laterality:  Left;   BREAST SURGERY Right    papilloma removed,cyst lft   CATARACT EXTRACTION Right 12/2016   Dr. Leora   CHOLECYSTECTOMY N/A 02/20/2017   Procedure: LAPAROSCOPIC CHOLECYSTECTOMY WITH INTRAOPERATIVE CHOLANGIOGRAM;  Surgeon: Curvin Deward MOULD, MD;  Location: [facility] OR;  Service: General;  Laterality:  N/A;   COLON SURGERY  2008   resection- endometriosis    Current Outpatient Medications  Medication Sig Dispense Refill   acetaminophen  (TYLENOL ) 500 MG tablet Take 1,000 mg by mouth every 6 (six) hours as needed for mild pain, moderate pain or headache.     albuterol  (VENTOLIN  HFA) 108 (90 Base) MCG/ACT inhaler Inhale 2 puffs into the lungs every 4 (four) hours as needed for wheezing or shortness of breath. 8.5 each 1   amitriptyline  (ELAVIL ) 10 MG tablet Take 1 tablet (10 mg total) by mouth at bedtime as needed. 90 tablet 3   aspirin  EC 81 MG tablet Take 1 tablet (81 mg total) by mouth daily. Swallow whole. 90 tablet 3   bisoprolol  (ZEBETA ) 10 MG tablet TAKE 1 TABLET BY MOUTH EVERY DAY FOR BLOOD PRESSURE 90 tablet 3   Blood Glucose Monitoring Suppl DEVI Use to check blood sugar fasting each morning. May substitute to any manufacturer covered by patient's insurance. Insurance covers One Touch. 1 each 0   Budeson-Glycopyrrol-Formoterol  (BREZTRI  AEROSPHERE) 160-9-4.8 MCG/ACT AERO Inhale 2 puffs into the lungs in the morning and at bedtime. Rinse mouth/gargle after each use. 5.9 g 2   clotrimazole -betamethasone  (LOTRISONE ) cream Apply 1 Application topically 2 (two) times daily. 15 g 2   estradiol  (ESTRACE ) 0.1 MG/GM vaginal cream as needed.     ferrous sulfate  325 (65 FE) MG tablet Take 1 tablet (325 mg total) by mouth daily. 90 tablet 0   fluticasone -salmeterol (ADVAIR DISKUS) 100-50 MCG/ACT AEPB Inhale into the lungs.     furosemide  (LASIX ) 40 MG tablet TAKE 1 TABLET DAILY FOR BLOOD PRESSURE & FLUID RETENTION / ANKLE SWELLING 90 tablet 3   Glucose Blood (BLOOD GLUCOSE TEST STRIPS) STRP Use to check blood sugar, fasting each morning. May substitute to any manufacturer covered by patient's insurance. Insurance covers One Touch. 100 strip 6   ipratropium (ATROVENT ) 0.03 % nasal spray PLACE 2 SPRAYS INTO THE NOSE 3 (THREE) TIMES DAILY. 90 mL 1   Lancets MISC Use to check blood sugar once daily. Dx:  E11.22, N18.2 100 each 2   levothyroxine  (SYNTHROID ) 100 MCG tablet TAKE 1 TABLET (100 MCG TOTAL) BY MOUTH DAILY BEFORE BREAKFAST. TAKE 1 TABLET DAILY ON AN EMPTY STOMACH WITH ONLY WATER FOR 30 MINUTES & NO ANTACID MEDS, CALCIUM  OR MAGNESIUM FOR 4 HOURS & AVOID BIOTIN 30 tablet 1   methocarbamol  (ROBAXIN ) 500 MG tablet Take 1 tablet (500 mg total) by mouth every 8 (eight) hours as needed for muscle spasms. 60 tablet 0   montelukast  (SINGULAIR ) 10 MG tablet TAKE 1 TABLET BY MOUTH DAILY FOR ALLERGIES 90 tablet 1   NEOMYCIN -POLYMYXIN-HYDROCORTISONE  (CORTISPORIN) 1 % SOLN OTIC solution Place 3 drops into both ears every 6 (six) hours. 10 mL 1   olmesartan  (BENICAR ) 40 MG tablet Take 1 tablet (40 mg total) by mouth daily. TAKE 1 TABLET DAILY FOR BLOOD PRESSURE & DIABETIC KIDNEY PROTECTION 90 tablet 1   pantoprazole  (PROTONIX ) 40 MG tablet Take 1 tablet Daily for Heartburn & Indigestion 90 tablet 1   Promethazine -DM 15-6.25 MG/5ML SYRP TAKE 5 MLS BY MOUTH 4 TIMES A DAY AS NEEDED FOR COUGH     rosuvastatin  (CRESTOR ) 10  MG tablet TAKE 1 TABLET BY MOUTH 3 TIMES A WEEK FOR CHOLESTEROL 36 tablet 3   Semaglutide ,0.25 or 0.5MG /DOS, (OZEMPIC , 0.25 OR 0.5 MG/DOSE,) 2 MG/1.5ML SOPN INJECT 0.25 MG WEEKLY FOR 4 WEEKS THEN INCREASE TO 0.5 MG WEEKLY FOR 4 WEEKS     sertraline  (ZOLOFT ) 100 MG tablet TAKE 1 TABLET DAILY FOR DEPRESSION/ANXIETY 90 tablet 3   sertraline  (ZOLOFT ) 25 MG tablet TAKE 1 TABLET (25 MG TOTAL) BY MOUTH DAILY. 90 tablet 2   Vitamin D , Ergocalciferol , (DRISDOL ) 1.25 MG (50000 UNIT) CAPS capsule TAKE 1 CAPSULE BY MOUTH 2 DAYS A WEEK 24 capsule 2   No current facility-administered medications for this visit.    Allergies as of 07/07/2024 - Review Complete 07/07/2024  Allergen Reaction Noted   Levaquin [levofloxacin in d5w] Other (See Comments) 09/19/2013   Doxycycline Other (See Comments) 09/19/2013   Dust mite extract Other (See Comments) 05/24/2024    Family History  Problem Relation Age of  Onset   Emphysema Mother        smoked   Allergies Mother    Asthma Mother    Heart disease Mother    Breast cancer Mother        with mets to Bone   Prostate cancer Father    Brain cancer Father    Hypertension Brother    Thyroid  disease Brother    Rheumatic fever Brother    Heart disease Brother    Hypertension Brother    Colon cancer Paternal Grandmother    Transient ischemic attack Sister    Heart disease Sister    Allergic rhinitis Neg Hx    Angioedema Neg Hx    Eczema Neg Hx    Immunodeficiency Neg Hx    Urticaria Neg Hx     Social History   Tobacco Use   Smoking status: Never   Smokeless tobacco: Never   Tobacco comments:    husband smokes in the house  Vaping Use   Vaping status: Never Used  Substance Use Topics   Alcohol use: No   Drug use: No     Review of Systems:    Constitutional: No weight loss, fever, chills Cardiovascular: No chest pain Respiratory: No SOB  Gastrointestinal: See HPI and otherwise negative   Physical Exam:  Vital signs: BP 110/86   Pulse 74   Ht 5' 3 (1.6 m)   Wt 224 lb (101.6 kg)   SpO2 98%   BMI 39.68 kg/m   Constitutional: Pleasant, well-appearing female in NAD, alert and cooperative Head:  Normocephalic and atraumatic.  Eyes: No scleral icterus.  Respiratory: Respirations even and unlabored. Lungs clear to auscultation bilaterally.  No wheezes, crackles, or rhonchi.  Cardiovascular:  Regular rate and rhythm. No murmurs. No peripheral edema. Gastrointestinal:  Soft, nondistended, nontender. No rebound or guarding. Normal bowel sounds. No appreciable masses or hepatomegaly. Rectal:  Not performed.  Neurologic:  Alert and oriented x4;  grossly normal neurologically.  Skin:   Dry and intact without significant lesions or rashes. Psychiatric: Oriented to person, place and time. Demonstrates good judgement and reason without abnormal affect or behaviors.   RELEVANT LABS AND IMAGING: CBC    Component Value Date/Time    WBC 10.7 04/14/2024 0959   RBC 4.61 04/14/2024 0959   HGB 13.0 04/14/2024 0959   HCT 42.6 04/14/2024 0959   PLT 292 04/14/2024 0959   MCV 92.4 04/14/2024 0959   MCH 28.2 04/14/2024 0959   MCHC 30.5 (L) 04/14/2024 0959   RDW  14.9 04/14/2024 0959   LYMPHSABS 2,375 04/14/2023 1550   MONOABS 0.5 01/26/2018 0511   EOSABS 342 04/14/2024 0959   BASOSABS 54 04/14/2024 0959    CMP     Component Value Date/Time   NA 139 04/14/2024 0959   NA 143 05/18/2020 1243   K 4.7 04/14/2024 0959   CL 102 04/14/2024 0959   CO2 29 04/14/2024 0959   GLUCOSE 118 (H) 04/14/2024 0959   BUN 28 (H) 04/14/2024 0959   BUN 15 05/18/2020 1243   CREATININE 1.16 (H) 04/14/2024 0959   CALCIUM  9.2 04/14/2024 0959   PROT 7.4 04/14/2024 0959   ALBUMIN 4.0 03/24/2017 1643   AST 22 04/14/2024 0959   ALT 27 04/14/2024 0959   ALKPHOS 75 03/24/2017 1643   BILITOT 0.3 04/14/2024 0959   GFRNONAA 51 (L) 01/25/2021 1607   GFRAA 59 (L) 01/25/2021 1607    Echocardiogram 11/28/2023 1. Left ventricular ejection fraction, by estimation, is 60 to 65% . The left ventricle has normal function. The left ventricle has no regional wall motion abnormalities. Left ventricular diastolic parameters were normal.  2. Right ventricular systolic function is mildly reduced. The right ventricular size is mildly enlarged.  3. The mitral valve is normal in structure. Trivial mitral valve regurgitation. No evidence of mitral stenosis. 4. The aortic valve is tricuspid. There is mild calcification of the aortic valve. There is mild thickening of the aortic valve. Aortic valve regurgitation is not visualized. Aortic valve sclerosis is present, with no evidence of aortic valve stenosis.  5. The inferior vena cava is dilated in size with > 50% respiratory variability, suggesting right atrial pressure of 8 mmHg.  Assessment/Plan:   Assessment & Plan Dysphagia and gastroesophageal reflux disease (GERD) Dysphagia and GERD exacerbated by Ozempic .  Symptoms improve off Ozempic . CT normal. Concern for esophageal inflammation or narrowing. Vomiting likely Ozempic  side effect. Protonix  prescribed.  - Scheduled upper endoscopy. I thoroughly discussed the procedure with the patient to include nature of the procedure, alternatives, benefits, and risks (including but not limited to bleeding, infection, perforation, anesthesia/cardiac/pulmonary complications). Patient verbalized understanding and gave verbal consent to proceed with procedure.  - Will need to hold Ozempic  prior to endoscopy. - Continue Protonix , increase to twice daily if needed. - Discuss with primary care about alternative medications to Ozempic .  Constipation Chronic constipation with small, hard stools worsened by Ozempic . Previous colonoscopy with reported poor prep due to vomiting.   - Start Miralax one capful daily and adjust to response - Scheduled colonoscopy with two-day prep   History of colonic polyps, surveillance colonoscopy due Previous Cologuard unreliable with history of adenomatous polyps. Last colonoscopy with poor prep.  - Scheduled colonoscopy with two-day prep. I thoroughly discussed the procedure with the patient to include nature of the procedure, alternatives, benefits, and risks (including but not limited to bleeding, infection, perforation, anesthesia/cardiac/pulmonary complications). Patient verbalized understanding and gave verbal consent to proceed with procedure.   Chest pain Intermittent left sided chest wall pain possibly related to reflux. Tender to touch, no radiating pain.   - Requested cardiology clearance for procedures. - Advised to seek emergency care if chest pain worsens or radiates. - Follow up with PCP regarding cough   Camie Furbish, PA-C [facility] 07/07/2024, 3:49 PM  Patient Care Team: Kayla Jeoffrey RAMAN, FNP as PCP - General (Family Medicine) Lonni Slain, MD as PCP - Cardiology (Cardiology) Harden Jerona GAILS, MD as Consulting Physician (Orthopedic Surgery) Love, Lynwood HERO, MD as Consulting Physician (Neurology)  Geraldene Loving, MD as Consulting Physician (Ophthalmology) Gastroenterology, Margarete as Consulting Physician (Gastroenterology)

## 2024-07-07 NOTE — Patient Instructions (Addendum)
 Follow up with PCP.   Start Miralax 1 capful daily in 8 ounces of liquid.  You have been scheduled for an endoscopy and colonoscopy. Please follow the written instructions given to you at your visit today.  If you use inhalers (even only as needed), please bring them with you on the day of your procedure.  DO NOT TAKE 7 DAYS PRIOR TO TEST- Trulicity  (dulaglutide ) Ozempic , Wegovy  (semaglutide ) Mounjaro, Zepbound (tirzepatide) Bydureon Bcise (exanatide extended release)  DO NOT TAKE 1 DAY PRIOR TO YOUR TEST Rybelsus  (semaglutide ) Adlyxin (lixisenatide) Victoza (liraglutide) Byetta (exanatide) ___________________________________________________________________________

## 2024-07-08 NOTE — Telephone Encounter (Signed)
   Name: Kristina Webb  DOB: [birth year]  MRN: [id number]  Primary Cardiologist: Shelda Bruckner, MD   Preoperative team, please contact this patient and set up a phone call appointment for further preoperative risk assessment. Please obtain consent and complete medication review. Thank you for your help.  I confirm that guidance regarding antiplatelet and oral anticoagulation therapy has been completed and, if necessary, noted below.  None requested   I also confirmed the patient resides in the state of [place] . As per [facility] [employer] telemedicine laws, the patient must reside in the state in which the provider is licensed.   [name], NP 07/08/2024, 2:33 PM [facility]

## 2024-07-08 NOTE — Telephone Encounter (Signed)
 Left message to call back to set up tele preop appt per preop APP Josefa Beauvais, FNP.   Notes from GI on clearance request mentions chest pain.

## 2024-07-09 NOTE — Telephone Encounter (Signed)
 2nd attempt to reach pt. See notes

## 2024-07-11 ENCOUNTER — Encounter: Payer: Self-pay | Admitting: Gastroenterology

## 2024-07-12 NOTE — Telephone Encounter (Signed)
 CORRECTION ON PREVIOUS NOTE: THE CALL TODAY TO REACH THE PT WAS THE 3RD ATTEMPT.    I will update the requesting office the pt will need to move her in office up sooner per preop APP in order to be cleared in time for her procedure.

## 2024-07-12 NOTE — Progress Notes (Signed)
 Attending Physician's Attestation   I have reviewed the chart.   I agree with the Advanced Practitioner's note, impression, and recommendations with any updates as below. Her symptomatology does require further workup from an endoscopic standpoint.  I do think that it is reasonable, that she move forward with colonoscopy for colon cancer screening as well as evaluation and changes in bowel habits (Cologuard should not be used in individuals who are not considered average risk and she has not previously had adenomas).  Cardiac optimization prior to procedures would be ideal.   Aloha Finner, MD [facility] Advanced Endoscopy Office # [phone]

## 2024-07-12 NOTE — Telephone Encounter (Signed)
 2nd attempt to reach pt about preop appt.

## 2024-07-12 NOTE — Telephone Encounter (Signed)
 D/w the preop APP pt will need IN OFFICE APPT not tele preop appt; per request from GI Cardiac clearance for chest pain

## 2024-07-13 NOTE — Telephone Encounter (Signed)
 Portal msg sent to call cardiology to schedule pre op appt.

## 2024-07-14 ENCOUNTER — Ambulatory Visit (INDEPENDENT_AMBULATORY_CARE_PROVIDER_SITE_OTHER): Admitting: Student

## 2024-07-14 DIAGNOSIS — M25562 Pain in left knee: Secondary | ICD-10-CM

## 2024-07-14 MED ORDER — TRIAMCINOLONE ACETONIDE 32 MG IX SRER: 32.0000 mg | Freq: Once | INTRA_ARTICULAR | Status: AC

## 2024-07-14 NOTE — Progress Notes (Signed)
     HPI: Patient presents today for a Zilretta  injection of the left knee as requested by Dr. Genelle.  She does have history of a cortisone injection performed in July without any significant long-term relief and has had blood sugar spikes in the past with these.  Last A1c on 8/20 was 7.8.   Physical Exam: Left knee is well-appearing without evidence of obvious deformity.  No erythema present and no warmth upon palpation.   Procedure Note  Patient: Kristina Webb             Date of Birth: 02/26/1956           MRN: [id number]             Visit Date: 07/14/2024  Procedures: Visit Diagnoses:  1. Acute pain of left knee     Large Joint Inj: L knee on 07/14/2024 5:03 PM Indications: pain Details: 22 G 1.5 in needle, anterolateral approach Medications: (Zilretta  32 mg) Outcome: tolerated well, no immediate complications Procedure, treatment alternatives, risks and benefits explained, specific risks discussed. Consent was given by the patient. Immediately prior to procedure a time out was called to verify the correct patient, procedure, equipment, support staff and site/side marked as required. Patient was prepped and draped in the usual sterile fashion.       Plan: Left knee Zilretta  injection performed today and return to clinic as needed    I personally saw and evaluated the patient, and participated in the management and treatment plan.  Leonce Reveal, PA-C Orthopedics

## 2024-07-19 ENCOUNTER — Other Ambulatory Visit

## 2024-07-19 ENCOUNTER — Telehealth: Payer: Self-pay

## 2024-07-19 DIAGNOSIS — E119 Type 2 diabetes mellitus without complications: Secondary | ICD-10-CM

## 2024-07-19 NOTE — Telephone Encounter (Signed)
 Copied from CRM [id number]. Topic: Clinical - Medication Question >> Jul 19, 2024 11:15 AM Tinnie C wrote: Reason for CRM: Pt feels like the Ozempic  is causing her to have a sore throat/ears and a persistent cough. Endoscopy dr suggested she get off of it/try a different medication. Requesting call back [phone]

## 2024-07-19 NOTE — Addendum Note (Signed)
 Addended by: CORINNA JESUSA SAUNDERS on: 07/19/2024 08:13 AM   Modules accepted: Orders

## 2024-07-20 ENCOUNTER — Ambulatory Visit: Payer: Self-pay | Admitting: Family Medicine

## 2024-07-20 LAB — CBC WITH DIFFERENTIAL/PLATELET
Absolute Lymphocytes: 2726 {cells}/uL (ref 850–3900)
Absolute Monocytes: 696 {cells}/uL (ref 200–950)
Basophils Absolute: 57 {cells}/uL (ref 0–200)
Basophils Relative: 0.4 %
Eosinophils Absolute: 256 {cells}/uL (ref 15–500)
Eosinophils Relative: 1.8 %
HCT: 42.4 % (ref 35.9–46.0)
Hemoglobin: 13.6 g/dL (ref 11.7–15.5)
MCH: 27.7 pg (ref 27.0–33.0)
MCHC: 32.1 g/dL (ref 31.6–35.4)
MCV: 86.4 fL (ref 81.4–101.7)
MPV: 9.7 fL (ref 7.5–12.5)
Monocytes Relative: 4.9 %
Neutro Abs: 10465 {cells}/uL — ABNORMAL HIGH (ref 1500–7800)
Neutrophils Relative %: 73.7 %
Platelets: 335 Thousand/uL (ref 140–400)
RBC: 4.91 Million/uL (ref 3.80–5.10)
RDW: 14.2 % (ref 11.0–15.0)
Total Lymphocyte: 19.2 %
WBC: 14.2 Thousand/uL — ABNORMAL HIGH (ref 3.8–10.8)

## 2024-07-20 LAB — COMPREHENSIVE METABOLIC PANEL WITH GFR
AG Ratio: 1.5 (calc) (ref 1.0–2.5)
ALT: 20 U/L (ref 6–29)
AST: 18 U/L (ref 10–35)
Albumin: 4.5 g/dL (ref 3.6–5.1)
Alkaline phosphatase (APISO): 69 U/L (ref 37–153)
BUN/Creatinine Ratio: 22 (calc) (ref 6–22)
BUN: 26 mg/dL — ABNORMAL HIGH (ref 7–25)
CO2: 28 mmol/L (ref 20–32)
Calcium: 9.5 mg/dL (ref 8.6–10.4)
Chloride: 99 mmol/L (ref 98–110)
Creat: 1.16 mg/dL — ABNORMAL HIGH (ref 0.50–1.05)
Globulin: 3.1 g/dL (ref 1.9–3.7)
Glucose, Bld: 130 mg/dL — ABNORMAL HIGH (ref 65–99)
Potassium: 4.2 mmol/L (ref 3.5–5.3)
Sodium: 139 mmol/L (ref 135–146)
Total Bilirubin: 0.4 mg/dL (ref 0.2–1.2)
Total Protein: 7.6 g/dL (ref 6.1–8.1)
eGFR: 52 mL/min/1.73m2 — ABNORMAL LOW (ref >=60)

## 2024-07-20 LAB — LIPID PANEL
Cholesterol: 148 mg/dL (ref <200)
HDL: 70 mg/dL (ref >=50)
LDL Cholesterol (Calc): 58 mg/dL
Non-HDL Cholesterol (Calc): 78 mg/dL (ref <130)
Total CHOL/HDL Ratio: 2.1 (calc) (ref <5.0)
Triglycerides: 110 mg/dL (ref <150)

## 2024-07-20 LAB — HEMOGLOBIN A1C
Hgb A1c MFr Bld: 6.5 % — ABNORMAL HIGH (ref <5.7)
Mean Plasma Glucose: 140 mg/dL
eAG (mmol/L): 7.7 mmol/L

## 2024-07-21 ENCOUNTER — Other Ambulatory Visit: Payer: Self-pay | Admitting: Family Medicine

## 2024-07-21 MED ORDER — METFORMIN HCL ER 500 MG PO TB24: 500.0000 mg | ORAL_TABLET | Freq: Every day | ORAL | 1 refills | Status: DC

## 2024-07-27 ENCOUNTER — Ambulatory Visit: Payer: PPO | Admitting: Nurse Practitioner

## 2024-07-27 NOTE — Telephone Encounter (Signed)
 GI calling to receive a update for pt clearance.

## 2024-07-27 NOTE — Telephone Encounter (Signed)
 Portal msg sent to pt to call cards and ask for preop nurse to schedule clearance appointment. Pt informed to wait on hold until she is able to speak with someone.

## 2024-07-27 NOTE — Telephone Encounter (Signed)
 I called pt right back and left her a message that we have been trying to reach her to schedule a sooner appt in office, see previous notes.    I will update the GI office as FYI as well.

## 2024-07-29 ENCOUNTER — Ambulatory Visit: Attending: Physician Assistant | Admitting: Physician Assistant

## 2024-07-29 ENCOUNTER — Encounter: Payer: Self-pay | Admitting: Physician Assistant

## 2024-07-29 VITALS — BP 126/66 | HR 72 | Ht 63.5 in | Wt 225.4 lb

## 2024-07-29 DIAGNOSIS — E785 Hyperlipidemia, unspecified: Secondary | ICD-10-CM | POA: Diagnosis not present

## 2024-07-29 DIAGNOSIS — R079 Chest pain, unspecified: Secondary | ICD-10-CM | POA: Diagnosis not present

## 2024-07-29 DIAGNOSIS — I10 Essential (primary) hypertension: Secondary | ICD-10-CM

## 2024-07-29 DIAGNOSIS — Z01818 Encounter for other preprocedural examination: Secondary | ICD-10-CM | POA: Diagnosis not present

## 2024-07-29 DIAGNOSIS — Z01810 Encounter for preprocedural cardiovascular examination: Secondary | ICD-10-CM | POA: Diagnosis not present

## 2024-07-29 NOTE — Telephone Encounter (Signed)
 Ordered a coronary CT given chest pain

## 2024-07-29 NOTE — Patient Instructions (Addendum)
 Medication Instructions:  NO CHANGES *If you need a refill on your cardiac medications before your next appointment, please call your pharmacy*  Lab Work: NO LABS If you have labs (blood work) drawn today and your tests are completely normal, you will receive your results only by: MyChart Message (if you have MyChart) OR A paper copy in the mail If you have any lab test that is abnormal or we need to change your treatment, we will call you to review the results.  Testing/Procedures:   Your cardiac CT will be scheduled at one of the below locations:   [facility] [address] [place], [place] [address] [phone] (Severe contrast allergies only)   OR   [facility] [address]  [place], [place] [address]   If scheduled at [facility], please arrive at the [facility] En[facility]ance (Entrance C2) of [facility] 30 minutes prior to test start time. You can use the FREE valet parking offered at entrance C (encouraged to control the heart rate for the test)  Proceed to the [facility] Radiology Department (first floor) to check-in and test prep.  All radiology patients and guests should use entrance C2 at [facility], accessed from [facility], even though the hospital's physical address listed is [address]  If scheduled at the Heart and Vascular Tower at [address], please enter the parking lot using the [address] entrance and use the FREE valet service at the patient drop-off area. Enter the building and check-in with registration on the main floor.   Please follow these instructions carefully (unless otherwise directed):  An IV will be required for this test and Nitroglycerin  will be given.  Hold all erectile dysfunction medications at least 3 days (72 hrs) prior to test. (Ie viagra, cialis, sildenafil, tadalafil, etc)   On the Night Before the Test: Be sure to  Drink plenty of water. Do not consume any caffeinated/decaffeinated beverages or chocolate 12 hours prior to your test. Do not take any antihistamines 12 hours prior to your test.  On the Day of the Test: Drink plenty of water until 1 hour prior to the test. Do not eat any food 1 hour prior to test. You may take your regular medications prior to the test.  HOLD OLMESARTAN  MORNING OF TEST DOUBLE BISOPROLOL  TO 20 MG MORNING OF TEST HOLD METFORMIN  24 HOURS PRIOR AND 48 HOURS AFTER TEST HOLD LASIX  MORNING OF TEST Patients who wear a continuous glucose monitor MUST remove the device prior to scanning. FEMALES- please wear underwire-free bra if available, avoid dresses & tight clothing        After the Test: Drink plenty of water. After receiving IV contrast, you may experience a mild flushed feeling. This is normal. On occasion, you may experience a mild rash up to 24 hours after the test. This is not dangerous. If this occurs, you can take Benadryl 25 mg, Zyrtec, Claritin, or Allegra and increase your fluid intake. (Patients taking Tikosyn should avoid Benadryl, and may take Zyrtec, Claritin, or Allegra) If you experience trouble breathing, this can be serious. If it is severe call 911 IMMEDIATELY. If it is mild, please call our office.  We will call to schedule your test 2-4 weeks out understanding that some insurance companies will need an authorization prior to the service being performed.   For more information and frequently asked questions, please visit our website : [email or web address]  For non-scheduling related questions, please contact the cardiac imaging nurse navigator should you have any questions/concerns: Cardiac Imaging Nurse Navigators Direct Office Dial: [phone]   For scheduling needs, including cancellations and rescheduling, please call Brittany, [phone].   Follow-Up: At [facility], you and your health needs are our priority.  As  part of our continuing mission to provide you with exceptional heart care, our providers are all part of one team.  This team includes your primary Cardiologist (physician) and Advanced Practice Providers or APPs (Physician Assistants and Nurse Practitioners) who all work together to provide you with the care you need, when you need it.  Your next appointment:   6 month(s)  Provider:   Shelda Bruckner, MD

## 2024-07-29 NOTE — Progress Notes (Unsigned)
 Cardiology Office Note   Date:  07/31/2024  ID:  Kristina Webb, DOB May 02, 1956, MRN [id number] PCP: Kristina Jeoffrey RAMAN, FNP  [facility] Providers Cardiologist:  Kristina Bruckner, MD     History of Present Illness Kristina Webb is a 68 y.o. female with a past medical history of hypertension, hyperlipidemia, asthma, OSA not on CPAP and nonobstructive CAD.  Coronary CTA obtained on 05/26/2020 showed coronary calcium  score 6 which placed the patient at 51st percentile for age and sex matched control, no evidence of CAD, there was only a single focus of calcium  in the proximal LAD however it did not obstruct the lumen of the coronary vessel at all.  Echocardiogram obtained in April 2025 showed normal EF 60 to 65%, no regional wall motion abnormality, trivial MR, mildly enlarged RV.  Patient was last seen by Kristina Finder, NP on 04/23/2024, she reported dyspnea on exertion and abdominal bloating, it was suspected it is more likely to be related to physical deconditioning with contribution from weight gain and hypothyroidism.  It was recommended to increase Lasix  to 80 mg daily for 3 days before going down to 40 mg daily thereafter.  Patient has upcoming colonoscopy and endoscopy scheduled for 08/17/2024.  Patient presents today for follow-up and preop clearance prior to upcoming colonoscopy and endoscopy procedure.  For the past several weeks, she has been having intermittent left-sided chest discomfort.  Symptom occurs both with activity and with emotional stress.  She is under a lot of stress with her husband being admitted multiple times in the past several months due to heart failure.  On physical exam, she has no lower extremity edema.  She denies any orthopnea or PND.  I recommend a coronary CTA to further assess.  ROS:   She denies chest pain, palpitations, dyspnea, pnd, orthopnea, n, v, dizziness, syncope, edema, weight gain, or early satiety. All other systems reviewed and are otherwise  negative except as noted above.    Studies Reviewed EKG Interpretation Date/Time:  Thursday July 29 2024 15:58:33 EST Ventricular Rate:  72 PR Interval:  182 QRS Duration:  128 QT Interval:  414 QTC Calculation: 453 R Axis:   57  Text Interpretation: Normal sinus rhythm Non-specific intra-ventricular conduction block When compared with ECG of 07-Nov-2023 14:55, No significant change was found Confirmed by Janene Boer [phone]) on 07/29/2024 4:12:21 PM    Cardiac Studies & Procedures   ______________________________________________________________________________________________     ECHOCARDIOGRAM  ECHOCARDIOGRAM COMPLETE 11/28/2023  Narrative ECHOCARDIOGRAM REPORT    Patient Name:   [name] Date of Exam: 11/28/2023 Medical Rec #:  [id number]    Height:       63.0 in Accession #:    [id number]   Weight:       221.0 lb Date of Birth:  02-May-1956   BSA:          2.017 m Patient Age:    67 years     BP:           106/72 mmHg Patient Gender: F            HR:           71 bpm. Exam Location:  Outpatient  Procedure: 2D Echo, Cardiac Doppler, Color Doppler and Intracardiac Opacification Agent (Both Spectral and Color Flow Doppler were utilized during procedure).  Indications:    R06.9 DOE; R60.0 Lower extremity edema  History:        Patient has no prior history of Echocardiogram examinations. Arrythmias:RBBB, Signs/Symptoms:Dyspnea  and Edema; Risk Factors:Hypertension, Dyslipidemia, Sleep Apnea, Diabetes, CKD stage 3 and Non-Smoker. Patient denies chest pain. she does have DOE with leg edema.  Sonographer:    Annabella Cater RVT, RDCS (AE), RDMS Referring Phys: [phone] CAITLIN S WALKER  IMPRESSIONS   1. Left ventricular ejection fraction, by estimation, is 60 to 65%. The left ventricle has normal function. The left ventricle has no regional wall motion abnormalities. Left ventricular diastolic parameters were normal. 2. Right ventricular systolic function is mildly  reduced. The right ventricular size is mildly enlarged. 3. The mitral valve is normal in structure. Trivial mitral valve regurgitation. No evidence of mitral stenosis. 4. The aortic valve is tricuspid. There is mild calcification of the aortic valve. There is mild thickening of the aortic valve. Aortic valve regurgitation is not visualized. Aortic valve sclerosis is present, with no evidence of aortic valve stenosis. 5. The inferior vena cava is dilated in size with >50% respiratory variability, suggesting right atrial pressure of 8 mmHg.  FINDINGS Left Ventricle: Left ventricular ejection fraction, by estimation, is 60 to 65%. The left ventricle has normal function. The left ventricle has no regional wall motion abnormalities. Definity  contrast agent was given IV to delineate the left ventricular endocardial borders. Strain was performed and the global longitudinal strain is indeterminate. The left ventricular internal cavity size was normal in size. There is no left ventricular hypertrophy. Left ventricular diastolic parameters were normal.  Right Ventricle: The right ventricular size is mildly enlarged. No increase in right ventricular wall thickness. Right ventricular systolic function is mildly reduced.  Left Atrium: Left atrial size was normal in size.  Right Atrium: Right atrial size was normal in size.  Pericardium: There is no evidence of pericardial effusion.  Mitral Valve: The mitral valve is normal in structure. Trivial mitral valve regurgitation. No evidence of mitral valve stenosis.  Tricuspid Valve: The tricuspid valve is normal in structure. Tricuspid valve regurgitation is not demonstrated. No evidence of tricuspid stenosis.  Aortic Valve: The aortic valve is tricuspid. There is mild calcification of the aortic valve. There is mild thickening of the aortic valve. Aortic valve regurgitation is not visualized. Aortic valve sclerosis is present, with no evidence of aortic  valve stenosis. Aortic valve mean gradient measures 4.0 mmHg. Aortic valve peak gradient measures 8.5 mmHg. Aortic valve area, by VTI measures 2.20 cm.  Pulmonic Valve: The pulmonic valve was normal in structure. Pulmonic valve regurgitation is not visualized. No evidence of pulmonic stenosis.  Aorta: The aortic root is normal in size and structure.  Venous: The inferior vena cava is dilated in size with greater than 50% respiratory variability, suggesting right atrial pressure of 8 mmHg.  IAS/Shunts: No atrial level shunt detected by color flow Doppler.  Additional Comments: 3D was performed not requiring image post processing on an independent workstation and was indeterminate.   LEFT VENTRICLE PLAX 2D LVIDd:         4.98 cm   Diastology LVIDs:         2.65 cm   LV e' medial:    4.39 cm/s LV PW:         1.07 cm   LV E/e' medial:  14.6 LV IVS:        0.78 cm   LV e' lateral:   8.10 cm/s LVOT diam:     1.80 cm   LV E/e' lateral: 7.9 LV SV:         55 LV SV Index:   27 LVOT Area:  2.54 cm   RIGHT VENTRICLE RV S prime:     13.10 cm/s TAPSE (M-mode): 2.2 cm  LEFT ATRIUM             Index        RIGHT ATRIUM           Index LA diam:        3.40 cm 1.69 cm/m   RA Area:     13.20 cm LA Vol (A2C):   53.1 ml 26.32 ml/m  RA Volume:   29.40 ml  14.57 ml/m LA Vol (A4C):   50.4 ml 24.98 ml/m LA Biplane Vol: 52.6 ml 26.07 ml/m AORTIC VALVE                    PULMONIC VALVE AV Area (Vmax):    2.06 cm     PV Vmax:       1.06 m/s AV Area (Vmean):   1.99 cm     PV Peak grad:  4.5 mmHg AV Area (VTI):     2.20 cm AV Vmax:           146.00 cm/s AV Vmean:          94.500 cm/s AV VTI:            0.249 m AV Peak Grad:      8.5 mmHg AV Mean Grad:      4.0 mmHg LVOT Vmax:         118.00 cm/s LVOT Vmean:        73.800 cm/s LVOT VTI:          0.215 m LVOT/AV VTI ratio: 0.86  AORTA Ao Root diam: 3.10 cm Ao Asc diam:  3.10 cm Ao Arch diam: 3.3 cm  MITRAL VALVE                TRICUSPID VALVE MV Area (PHT): 3.11 cm    TR Peak grad:   19.9 mmHg MV Decel Time: 244 msec    TR Vmax:        223.00 cm/s MV E velocity: 64.30 cm/s MV A velocity: 83.40 cm/s  SHUNTS MV E/A ratio:  0.77        Systemic VTI:  0.22 m Systemic Diam: 1.80 cm  Maude Emmer MD Electronically signed by Maude Emmer MD Signature Date/Time: 11/28/2023/4:03:13 PM    Final      CT SCANS  CT CORONARY MORPH W/CTA COR W/SCORE 05/26/2020  Addendum 05/26/2020  5:32 PM ADDENDUM REPORT: 05/26/2020 17:30  HISTORY: Chest pain, nonspecific  EXAM: Cardiac/Coronary CT  TECHNIQUE: The patient was scanned on a [employer].  PROTOCOL: A 120 kV prospective scan was triggered in the descending thoracic aorta at 111 HU's. Axial non-contrast 3 mm slices were carried out through the heart. The data set was analyzed on a dedicated work station and scored using the Agatson method. Gantry rotation speed was 250 msecs and collimation was 0.6 mm. Beta blockade and 0.8 mg of sl NTG was given. The 3D data set was reconstructed in 5% intervals of 35-75% of the R-R cycle. Diastolic phases were analyzed on a dedicated work station using MPR, MIP and VRT modes. The patient received 80mL OMNIPAQUE  IOHEXOL  350 MG/ML SOLN of contrast.  FINDINGS: Coronary calcium  score: The patient's coronary artery calcium  score is 6, which places the patient in the 51 percentile.  Coronary arteries: Normal coronary origins.  Left dominance.  Right Coronary Artery: Small caliber, nondominant vessel. No significant  plaque or stenosis.  Left Main Coronary Artery: Normal caliber vessel. No significant plaque or stenosis. Small ramus intermedius without significant plaque or stenosis.  Left Anterior Descending Coronary Artery: Normal caliber vessel. Small focus of calcified plaque in the proximal LAD without any stenosis. Gives rise to 1 small diagonal branch.  Left Circumflex Artery: Normal caliber vessel. No  significant plaque or stenosis. Gives rise to 1 large OM branch.  Aorta: Normal size, 30 mm at the mid ascending aorta (level of the PA bifurcation) measured double oblique. No calcifications. No dissection.  Aortic Valve: No calcifications. Trileaflet.  Other findings:  Normal pulmonary vein drainage into the left atrium.  Normal left atrial appendage without a thrombus.  Normal size of the pulmonary artery.  IMPRESSION: 1. No evidence of CAD, CADRADS = 0. There is a single focus of calcium  in the proximal LAD, but it does not obstruct the lumen at all.  2. Coronary calcium  score of 6. This was 29 percentile for age and sex matched control.  3. Normal coronary origin with left dominance.   Electronically Signed By: Kristina Webb M.D. On: 05/26/2020 17:30  Narrative EXAM: OVER-READ INTERPRETATION  CT CHEST  The following report is an over-read performed by radiologist Dr. Franky Crease of [facility] on 05/26/2020. This over-read does not include interpretation of cardiac or coronary anatomy or pathology. The coronary CTA interpretation by the cardiologist is attached.  COMPARISON:  None.  FINDINGS: Vascular: Heart is normal size.  Aorta normal caliber.  Mediastinum/Nodes: No adenopathy.  Lungs/Pleura: No confluent opacities or effusions.  Upper Abdomen: Imaging into the upper abdomen demonstrates no acute findings.  Musculoskeletal: Chest wall soft tissues are unremarkable. No acute bony abnormality.  IMPRESSION: No acute or significant extracardiac abnormality.  Electronically Signed: By: Franky Crease M.D. On: 05/26/2020 12:21     ______________________________________________________________________________________________      Risk Assessment/Calculations          Physical Exam VS:  BP 126/66 (BP Location: Right Arm, Patient Position: Sitting, Cuff Size: Large)   Pulse 72   Ht 5' 3.5 (1.613 m)   Wt 225 lb 6.4 oz  (102.2 kg)   SpO2 97%   BMI 39.30 kg/m        Wt Readings from Last 3 Encounters:  07/29/24 225 lb 6.4 oz (102.2 kg)  07/07/24 224 lb (101.6 kg)  05/31/24 232 lb 9.6 oz (105.5 kg)    GEN: Well nourished, well developed in no acute distress NECK: No JVD; No carotid bruits CARDIAC: RRR, no murmurs, rubs, gallops RESPIRATORY:  Clear to auscultation without rales, wheezing or rhonchi  ABDOMEN: Soft, non-tender, non-distended EXTREMITIES:  No edema; No deformity   ASSESSMENT AND PLAN  Preoperative clearance: Patient has outpatient mean colonoscopy and endoscopy procedure, however she has been having intermittent chest discomfort.  Symptom occur both with physical exertion and emotional stress.  I recommended repeat coronary CT prior to the upcoming GI procedure  Chest pain: See #1, will obtain coronary CTA.  In the morning of the coronary CTA, patient has been instructed to hold olmesartan  and take 2 tablet of bisoprolol  to slow down the heart rate  Hypertension: Blood pressure well-controlled  Hyperlipidemia: On rosuvastatin .           Dispo: Patient may proceed with upcoming surgery if coronary CTA is reassuring.  Signed, Scot Ford, PA

## 2024-07-29 NOTE — Telephone Encounter (Signed)
 Pt has appt 07/29/24 [name], PAC

## 2024-07-30 ENCOUNTER — Other Ambulatory Visit: Payer: Self-pay | Admitting: Family Medicine

## 2024-07-30 ENCOUNTER — Other Ambulatory Visit: Payer: Self-pay | Admitting: Family

## 2024-07-30 DIAGNOSIS — I10 Essential (primary) hypertension: Secondary | ICD-10-CM

## 2024-08-02 ENCOUNTER — Ambulatory Visit: Admitting: Family Medicine

## 2024-08-02 NOTE — Telephone Encounter (Signed)
 I will update the requesting office the pt has been ordered a cardiac CT per Scot Ford, [facility] dx chest pain.    Once the pt has been cleared PAC will send his notes.

## 2024-08-03 ENCOUNTER — Ambulatory Visit: Admitting: Family Medicine

## 2024-08-03 ENCOUNTER — Encounter: Payer: Self-pay | Admitting: Family Medicine

## 2024-08-03 VITALS — BP 108/68 | HR 75 | Temp 98.2°F | Ht 63.5 in | Wt 223.2 lb

## 2024-08-03 DIAGNOSIS — R053 Chronic cough: Secondary | ICD-10-CM | POA: Insufficient documentation

## 2024-08-03 DIAGNOSIS — E1122 Type 2 diabetes mellitus with diabetic chronic kidney disease: Secondary | ICD-10-CM

## 2024-08-03 DIAGNOSIS — E039 Hypothyroidism, unspecified: Secondary | ICD-10-CM

## 2024-08-03 DIAGNOSIS — K2100 Gastro-esophageal reflux disease with esophagitis, without bleeding: Secondary | ICD-10-CM

## 2024-08-03 DIAGNOSIS — E611 Iron deficiency: Secondary | ICD-10-CM

## 2024-08-03 MED ORDER — GLIPIZIDE ER 2.5 MG PO TB24: 2.5000 mg | ORAL_TABLET | Freq: Every day | ORAL | 3 refills | Status: AC

## 2024-08-03 NOTE — Progress Notes (Signed)
 Acute Office Visit  Patient ID: Kristina Webb, female    DOB: August 29, 1955, 68 y.o.   MRN: [id number]  PCP: Kayla Jeoffrey RAMAN, FNP  Chief Complaint  Patient presents with   Medical Management of Chronic Issues    Discuss Diabetes medication      Subjective:     HPI  Discussed the use of AI scribe software for clinical note transcription with the patient, who gave verbal consent to proceed.  History of Present Illness Kristina Webb is a 68 year old female with diabetes and chronic cough who presents for diabetes management and evaluation of persistent cough.  She has been experiencing a persistent cough for about one to two months, typically occurring every spring and fall, often associated with bronchial infections due to exposure to cigarette smoke from a household member, [employer]. She has been using Singulair  for about fifteen years and occasionally takes Zyrtec at night. She has not been using albuterol  regularly. She reports no wheezing, shortness of breath, fevers, chills, or body aches. She notes a raspy voice and sore throat, which she attributes to previous use of Ozempic .  Regarding her diabetes, she is not currently taking any medication. She previously used Ozempic , which she stopped due to concerns about her throat and thyroid . Her A1c was 6.5% while on Ozempic  and increased to 7.8% after discontinuation. She has a history of using metformin  but discontinued it due to muscle weakness and concerns about her chronic stage two kidney disease. She monitors her blood sugar in the mornings, reporting levels mostly between 110 and 120 mg/dL.  She experiences intermittent chest pain, which is being evaluated by cardiology with a planned coronary CT scan. She is also scheduled for a colonoscopy and endoscopy, requiring cardiology clearance. She denies palpitations, headaches, or leg swelling.   Review of Systems  All other systems reviewed and are negative.   Past Medical History:   Diagnosis Date   Anemia    hx   Anxiety    Arthritis    back   Asthma    Depression    Deviated septum    GERD (gastroesophageal reflux disease)    Headache    History of hiatal hernia    Hyperlipemia    Hypertension    Hypothyroidism    OSA (obstructive sleep apnea)    does not use -last 6 months   Pre-diabetes    per pt    Past Surgical History:  Procedure Laterality Date   ANKLE GANGLION CYST EXCISION Left    BACK SURGERY     L3-4 decompression   birth mark treatments     BREAST LUMPECTOMY WITH RADIOACTIVE SEED LOCALIZATION Left 02/02/2016   Procedure: LEFT BREAST LUMPECTOMY WITH RADIOACTIVE SEED LOCALIZATION;  Surgeon: Deward Null III, MD;  Location: [facility] OR;  Service: General;  Laterality: Left;   BREAST SURGERY Right    papilloma removed,cyst lft   CATARACT EXTRACTION Right 12/2016   Dr. Leora   CHOLECYSTECTOMY N/A 02/20/2017   Procedure: LAPAROSCOPIC CHOLECYSTECTOMY WITH INTRAOPERATIVE CHOLANGIOGRAM;  Surgeon: Null Deward MOULD, MD;  Location: [facility] OR;  Service: General;  Laterality: N/A;   COLON SURGERY  2008   resection- endometriosis    Current Outpatient Medications on File Prior to Visit  Medication Sig Dispense Refill   acetaminophen  (TYLENOL ) 500 MG tablet Take 1,000 mg by mouth every 6 (six) hours as needed for mild pain, moderate pain or headache.     albuterol  (VENTOLIN  HFA) 108 (90 Base) MCG/ACT inhaler Inhale 2  puffs into the lungs every 4 (four) hours as needed for wheezing or shortness of breath. 8.5 each 1   amitriptyline  (ELAVIL ) 10 MG tablet Take 1 tablet (10 mg total) by mouth at bedtime as needed. 90 tablet 3   aspirin  EC 81 MG tablet Take 1 tablet (81 mg total) by mouth daily. Swallow whole. 90 tablet 3   bisoprolol  (ZEBETA ) 10 MG tablet TAKE 1 TABLET BY MOUTH EVERY DAY FOR BLOOD PRESSURE 90 tablet 3   Blood Glucose Monitoring Suppl DEVI Use to check blood sugar fasting each morning. May substitute to any manufacturer covered by patient's insurance.  Insurance covers One Touch. 1 each 0   Budeson-Glycopyrrol-Formoterol  (BREZTRI  AEROSPHERE) 160-9-4.8 MCG/ACT AERO Inhale 2 puffs into the lungs in the morning and at bedtime. Rinse mouth/gargle after each use. 5.9 g 2   clotrimazole -betamethasone  (LOTRISONE ) cream Apply 1 Application topically 2 (two) times daily. 15 g 2   estradiol  (ESTRACE ) 0.1 MG/GM vaginal cream as needed.     ferrous sulfate  325 (65 FE) MG tablet TAKE 1 TABLET BY MOUTH EVERY DAY 90 tablet 0   fluticasone -salmeterol (ADVAIR DISKUS) 100-50 MCG/ACT AEPB Inhale into the lungs.     furosemide  (LASIX ) 40 MG tablet TAKE 1 TABLET DAILY FOR BLOOD PRESSURE & FLUID RETENTION / ANKLE SWELLING 90 tablet 3   Glucose Blood (BLOOD GLUCOSE TEST STRIPS) STRP Use to check blood sugar, fasting each morning. May substitute to any manufacturer covered by patient's insurance. Insurance covers One Touch. 100 strip 6   ipratropium (ATROVENT ) 0.03 % nasal spray PLACE 2 SPRAYS INTO THE NOSE 3 (THREE) TIMES DAILY. 90 mL 1   Lancets MISC Use to check blood sugar once daily. Dx: E11.22, N18.2 100 each 2   levothyroxine  (SYNTHROID ) 100 MCG tablet TAKE 1 TABLET (100 MCG TOTAL) BY MOUTH DAILY BEFORE BREAKFAST. TAKE 1 TABLET DAILY ON AN EMPTY STOMACH WITH ONLY WATER FOR 30 MINUTES & NO ANTACID MEDS, CALCIUM  OR MAGNESIUM FOR 4 HOURS & AVOID BIOTIN 30 tablet 1   methocarbamol  (ROBAXIN ) 500 MG tablet Take 1 tablet (500 mg total) by mouth every 8 (eight) hours as needed for muscle spasms. 60 tablet 0   montelukast  (SINGULAIR ) 10 MG tablet TAKE 1 TABLET BY MOUTH DAILY FOR ALLERGIES 90 tablet 1   Na Sulfate-K Sulfate-Mg Sulfate concentrate (SUPREP) 17.5-3.13-1.6 GM/177ML SOLN Take 1 kit by mouth once.     NEOMYCIN -POLYMYXIN-HYDROCORTISONE  (CORTISPORIN) 1 % SOLN OTIC solution Place 3 drops into both ears every 6 (six) hours. 10 mL 1   olmesartan  (BENICAR ) 40 MG tablet Take 1 tablet (40 mg total) by mouth daily. TAKE 1 TABLET DAILY FOR BLOOD PRESSURE & DIABETIC KIDNEY  PROTECTION 90 tablet 1   pantoprazole  (PROTONIX ) 40 MG tablet Take 1 tablet Daily for Heartburn & Indigestion 90 tablet 1   rosuvastatin  (CRESTOR ) 10 MG tablet TAKE 1 TABLET BY MOUTH 3 TIMES A WEEK FOR CHOLESTEROL 36 tablet 3   sertraline  (ZOLOFT ) 100 MG tablet TAKE 1 TABLET DAILY FOR DEPRESSION/ANXIETY 90 tablet 3   sertraline  (ZOLOFT ) 25 MG tablet TAKE 1 TABLET (25 MG TOTAL) BY MOUTH DAILY. 90 tablet 2   Vitamin D , Ergocalciferol , (DRISDOL ) 1.25 MG (50000 UNIT) CAPS capsule TAKE 1 CAPSULE BY MOUTH 2 DAYS A WEEK 24 capsule 2   Current Facility-Administered Medications on File Prior to Visit  Medication Dose Route Frequency Provider Last Rate Last Admin   Triamcinolone  Acetonide (ZILRETTA ) intra-articular injection 32 mg  32 mg Intra-articular Once  Allergies  Allergen Reactions   Levaquin [Levofloxacin In D5w] Other (See Comments)    Ankles hurt really bad ? ACHILLES TENDON ?   Doxycycline Other (See Comments)    UNSPECIFIED REACTION    Dust Mite Extract Other (See Comments)       Objective:    BP 108/68 (BP Location: Left Arm, Patient Position: Sitting, Cuff Size: Large)   Pulse 75   Temp 98.2 F (36.8 C)   Ht 5' 3.5 (1.613 m)   Wt 223 lb 3.2 oz (101.2 kg)   SpO2 96%   BMI 38.92 kg/m  BP Readings from Last 3 Encounters:  08/03/24 108/68  07/29/24 126/66  07/07/24 110/86   Wt Readings from Last 3 Encounters:  08/03/24 223 lb 3.2 oz (101.2 kg)  07/29/24 225 lb 6.4 oz (102.2 kg)  07/07/24 224 lb (101.6 kg)      Physical Exam Vitals and nursing note reviewed.  Constitutional:      Appearance: Normal appearance. She is normal weight.  HENT:     Head: Normocephalic and atraumatic.     Mouth/Throat:     Mouth: Mucous membranes are moist. No oral lesions.     Tongue: No lesions. Tongue does not deviate from midline.     Palate: No mass and lesions.     Pharynx: Oropharynx is clear. Uvula midline.     Tonsils: No tonsillar exudate.  Neck:     Thyroid : No  thyroid  mass, thyromegaly or thyroid  tenderness.  Cardiovascular:     Rate and Rhythm: Normal rate and regular rhythm.     Pulses: Normal pulses.     Heart sounds: Normal heart sounds.  Pulmonary:     Effort: Pulmonary effort is normal.     Breath sounds: Normal breath sounds.  Musculoskeletal:     Cervical back: No tenderness.  Lymphadenopathy:     Cervical: No cervical adenopathy.  Skin:    General: Skin is warm and dry.  Neurological:     General: No focal deficit present.     Mental Status: She is alert and oriented to person, place, and time. Mental status is at baseline.  Psychiatric:        Mood and Affect: Mood normal.        Behavior: Behavior normal.        Thought Content: Thought content normal.        Judgment: Judgment normal.       No results found for any visits on 08/03/24.     Assessment & Plan:   Problem List Items Addressed This Visit     Iron deficiency    Assessment and Plan Assessment & Plan Type 2 diabetes mellitus with stage 2 chronic kidney disease Previously on Ozempic , discontinued due to throat and thyroid  concerns. Metformin  not tolerated. Current fasting glucose 110-120 mg/dL. Considering glipizide . - Started glipizide  2.5 mg daily. - Monitor fasting glucose levels at home. - Recheck A1c in two months. - Encouraged dietary modifications and exercise.  Chronic cough due to allergies and asthma Chronic cough biannually due to allergies and asthma. No wheezing or shortness of breath. Environmental factors like smoke may contribute. On Singulair  and Zyrtec. No infection or bronchitis signs. - Use albuterol  as needed for shortness of breath or wheezing. - Take Zyrtec regularly. - Consider switching allergy medications if needed. - Monitor for signs of infection or worsening symptoms.  Gastroesophageal reflux disease with esophagitis GERD with esophagitis, possibly contributing to chronic cough. Scheduled for upper endoscopy. -  Proceed  with scheduled upper endoscopy. - Continue current reflux medication.  Hypothyroidism Managed with levothyroxine .  General health maintenance Discussion of upcoming endoscopy and potential ENT referral if symptoms persist. - Proceed with scheduled endoscopy. - Consider ENT referral if symptoms persist after endoscopy.    Meds ordered this encounter  Medications   glipiZIDE  (GLUCOTROL  XL) 2.5 MG 24 hr tablet    Sig: Take 1 tablet (2.5 mg total) by mouth daily with breakfast.    Dispense:  90 tablet    Refill:  3    Supervising Provider:   DUANNE LOWERS T [[id number]]    Return in about 3 months (around 11/01/2024) for chronic follow-up with labs 1 week prior.  Jeoffrey GORMAN Barrio, FNP [facility]

## 2024-08-04 ENCOUNTER — Ambulatory Visit (INDEPENDENT_AMBULATORY_CARE_PROVIDER_SITE_OTHER)

## 2024-08-04 ENCOUNTER — Encounter (HOSPITAL_COMMUNITY): Payer: Self-pay

## 2024-08-04 VITALS — Ht 63.5 in | Wt 225.0 lb

## 2024-08-04 DIAGNOSIS — Z0000 Encounter for general adult medical examination without abnormal findings: Secondary | ICD-10-CM

## 2024-08-04 NOTE — Progress Notes (Signed)
 Chief Complaint  Patient presents with   Medicare Wellness     Subjective:   Kristina Webb is a 68 y.o. female who presents for a Medicare Annual Wellness Visit.  Visit info / Clinical Intake: Medicare Wellness Visit Type:: Subsequent Annual Wellness Visit Persons participating in visit and providing information:: patient Medicare Wellness Visit Mode:: Telephone If telephone:: video declined Since this visit was completed virtually, some vitals may be partially provided or unavailable. Missing vitals are due to the limitations of the virtual format.: Documented vitals are patient reported If Telephone or Video please confirm:: I connected with patient using audio/video enable telemedicine. I verified patient identity with two identifiers, discussed telehealth limitations, and patient agreed to proceed. Patient Location:: home Provider Location:: office Interpreter Needed?: No Pre-visit prep was completed: yes AWV questionnaire completed by patient prior to visit?: no Living arrangements:: lives with spouse/significant other Patient's Overall Health Status Rating: very good Typical amount of pain: some Does pain affect daily life?: no Are you currently prescribed opioids?: no  Dietary Habits and Nutritional Risks How many meals a day?: 3 Eats fruit and vegetables daily?: yes Most meals are obtained by: preparing own meals In the last 2 weeks, have you had any of the following?: none Diabetic:: (!) yes Any non-healing wounds?: no How often do you check your BS?: 1 Would you like to be referred to a Nutritionist or for Diabetic Management? : no  Functional Status Activities of Daily Living (to include ambulation/medication): Independent Ambulation: Independent Medication Administration: Independent Home Management (perform basic housework or laundry): Independent Manage your own finances?: yes Primary transportation is: driving Concerns about vision?: no *vision screening  is required for WTM* Concerns about hearing?: no  Fall Screening Falls in the past year?: 0 Number of falls in past year: 0 Was there an injury with Fall?: 0 Fall Risk Category Calculator: 0 Patient Fall Risk Level: Low Fall Risk  Fall Risk Patient at Risk for Falls Due to: No Fall Risks Fall risk Follow up: Falls prevention discussed; Education provided; Falls evaluation completed  Home and Transportation Safety: All rugs have non-skid backing?: yes All stairs or steps have railings?: yes Grab bars in the bathtub or shower?: yes Have non-skid surface in bathtub or shower?: yes Good home lighting?: yes Regular seat belt use?: yes Hospital stays in the last year:: no  Cognitive Assessment Difficulty concentrating, remembering, or making decisions? : no Will 6CIT or Mini Cog be Completed: no 6CIT or Mini Cog Declined: patient alert, oriented, able to answer questions appropriately and recall recent events  Advance Directives (For Healthcare) Does Patient Have a Medical Advance Directive?: No Would patient like information on creating a medical advance directive?: Yes (MAU/Ambulatory/Procedural Areas - Information given)  Reviewed/Updated  Reviewed/Updated: Reviewed All (Medical, Surgical, Family, Medications, Allergies, Care Teams, Patient Goals)    Allergies (verified) Levaquin [levofloxacin in d5w], Doxycycline, and Dust mite extract   Current Medications (verified) Outpatient Encounter Medications as of 08/04/2024  Medication Sig   acetaminophen  (TYLENOL ) 500 MG tablet Take 1,000 mg by mouth every 6 (six) hours as needed for mild pain, moderate pain or headache.   albuterol  (VENTOLIN  HFA) 108 (90 Base) MCG/ACT inhaler Inhale 2 puffs into the lungs every 4 (four) hours as needed for wheezing or shortness of breath.   amitriptyline  (ELAVIL ) 10 MG tablet Take 1 tablet (10 mg total) by mouth at bedtime as needed.   aspirin  EC 81 MG tablet Take 1 tablet (81 mg total) by  mouth daily.  Swallow whole.   bisoprolol  (ZEBETA ) 10 MG tablet TAKE 1 TABLET BY MOUTH EVERY DAY FOR BLOOD PRESSURE   Blood Glucose Monitoring Suppl DEVI Use to check blood sugar fasting each morning. May substitute to any manufacturer covered by patient's insurance. Insurance covers One Touch.   Budeson-Glycopyrrol-Formoterol  (BREZTRI  AEROSPHERE) 160-9-4.8 MCG/ACT AERO Inhale 2 puffs into the lungs in the morning and at bedtime. Rinse mouth/gargle after each use.   clotrimazole -betamethasone  (LOTRISONE ) cream Apply 1 Application topically 2 (two) times daily.   estradiol  (ESTRACE ) 0.1 MG/GM vaginal cream as needed.   ferrous sulfate  325 (65 FE) MG tablet TAKE 1 TABLET BY MOUTH EVERY DAY   fluticasone -salmeterol (ADVAIR DISKUS) 100-50 MCG/ACT AEPB Inhale into the lungs.   furosemide  (LASIX ) 40 MG tablet TAKE 1 TABLET DAILY FOR BLOOD PRESSURE & FLUID RETENTION / ANKLE SWELLING   glipiZIDE  (GLUCOTROL  XL) 2.5 MG 24 hr tablet Take 1 tablet (2.5 mg total) by mouth daily with breakfast.   Glucose Blood (BLOOD GLUCOSE TEST STRIPS) STRP Use to check blood sugar, fasting each morning. May substitute to any manufacturer covered by patient's insurance. Insurance covers One Touch.   ipratropium (ATROVENT ) 0.03 % nasal spray PLACE 2 SPRAYS INTO THE NOSE 3 (THREE) TIMES DAILY.   Lancets MISC Use to check blood sugar once daily. Dx: E11.22, N18.2   levothyroxine  (SYNTHROID ) 100 MCG tablet TAKE 1 TABLET (100 MCG TOTAL) BY MOUTH DAILY BEFORE BREAKFAST. TAKE 1 TABLET DAILY ON AN EMPTY STOMACH WITH ONLY WATER FOR 30 MINUTES & NO ANTACID MEDS, CALCIUM  OR MAGNESIUM FOR 4 HOURS & AVOID BIOTIN   methocarbamol  (ROBAXIN ) 500 MG tablet Take 1 tablet (500 mg total) by mouth every 8 (eight) hours as needed for muscle spasms.   montelukast  (SINGULAIR ) 10 MG tablet TAKE 1 TABLET BY MOUTH DAILY FOR ALLERGIES   Na Sulfate-K Sulfate-Mg Sulfate concentrate (SUPREP) 17.5-3.13-1.6 GM/177ML SOLN Take 1 kit by mouth once.    NEOMYCIN -POLYMYXIN-HYDROCORTISONE  (CORTISPORIN) 1 % SOLN OTIC solution Place 3 drops into both ears every 6 (six) hours.   olmesartan  (BENICAR ) 40 MG tablet Take 1 tablet (40 mg total) by mouth daily. TAKE 1 TABLET DAILY FOR BLOOD PRESSURE & DIABETIC KIDNEY PROTECTION   pantoprazole  (PROTONIX ) 40 MG tablet Take 1 tablet Daily for Heartburn & Indigestion   rosuvastatin  (CRESTOR ) 10 MG tablet TAKE 1 TABLET BY MOUTH 3 TIMES A WEEK FOR CHOLESTEROL   sertraline  (ZOLOFT ) 100 MG tablet TAKE 1 TABLET DAILY FOR DEPRESSION/ANXIETY   sertraline  (ZOLOFT ) 25 MG tablet TAKE 1 TABLET (25 MG TOTAL) BY MOUTH DAILY.   Vitamin D , Ergocalciferol , (DRISDOL ) 1.25 MG (50000 UNIT) CAPS capsule TAKE 1 CAPSULE BY MOUTH 2 DAYS A WEEK   Facility-Administered Encounter Medications as of 08/04/2024  Medication   Triamcinolone  Acetonide (ZILRETTA ) intra-articular injection 32 mg    History: Past Medical History:  Diagnosis Date   Anemia    hx   Anxiety    Arthritis    back   Asthma    Depression    Deviated septum    GERD (gastroesophageal reflux disease)    Headache    History of hiatal hernia    Hyperlipemia    Hypertension    Hypothyroidism    OSA (obstructive sleep apnea)    does not use -last 6 months   Pre-diabetes    per pt   Past Surgical History:  Procedure Laterality Date   ANKLE GANGLION CYST EXCISION Left    BACK SURGERY     L3-4 decompression   birth  mark treatments     BREAST LUMPECTOMY WITH RADIOACTIVE SEED LOCALIZATION Left 02/02/2016   Procedure: LEFT BREAST LUMPECTOMY WITH RADIOACTIVE SEED LOCALIZATION;  Surgeon: Deward Null III, MD;  Location: [facility] OR;  Service: General;  Laterality: Left;   BREAST SURGERY Right    papilloma removed,cyst lft   CATARACT EXTRACTION Right 12/2016   Dr. Leora   CHOLECYSTECTOMY N/A 02/20/2017   Procedure: LAPAROSCOPIC CHOLECYSTECTOMY WITH INTRAOPERATIVE CHOLANGIOGRAM;  Surgeon: Null Deward MOULD, MD;  Location: [facility] OR;  Service: General;  Laterality: N/A;    COLON SURGERY  2008   resection- endometriosis   Family History  Problem Relation Age of Onset   Emphysema Mother        smoked   Allergies Mother    Asthma Mother    Heart disease Mother    Breast cancer Mother        with mets to Bone   Prostate cancer Father    Brain cancer Father    Hypertension Brother    Thyroid  disease Brother    Rheumatic fever Brother    Heart disease Brother    Hypertension Brother    Colon cancer Paternal Grandmother    Transient ischemic attack Sister    Heart disease Sister    Allergic rhinitis Neg Hx    Angioedema Neg Hx    Eczema Neg Hx    Immunodeficiency Neg Hx    Urticaria Neg Hx    Social History   Occupational History   Occupation: retired  Tobacco Use   Smoking status: Never   Smokeless tobacco: Never   Tobacco comments:    husband smokes in the house  Vaping Use   Vaping status: Never Used  Substance and Sexual Activity   Alcohol use: No   Drug use: No   Sexual activity: Yes    Partners: Male    Birth control/protection: None, Post-menopausal   Tobacco Counseling Counseling given: Not Answered Tobacco comments: husband smokes in the house  SDOH Screenings   Food Insecurity: No Food Insecurity (08/04/2024)  Housing: Low Risk  (08/04/2024)  Transportation Needs: No Transportation Needs (08/04/2024)  Utilities: Not At Risk (08/04/2024)  Depression (PHQ2-9): Medium Risk (08/04/2024)  Financial Resource Strain: High Risk (12/17/2023)  Physical Activity: Insufficiently Active (08/04/2024)  Social Connections: Moderately Integrated (08/04/2024)  Stress: Stress Concern Present (08/04/2024)  Tobacco Use: Low Risk  (08/04/2024)  Health Literacy: Adequate Health Literacy (08/04/2024)   See flowsheets for full screening details  Depression Screen PHQ 2 & 9 Depression Scale- Over the past 2 weeks, how often have you been bothered by any of the following problems? Little interest or pleasure in doing things: 2 Feeling down,  depressed, or hopeless (PHQ Adolescent also includes...irritable): 0 PHQ-2 Total Score: 2 Trouble falling or staying asleep, or sleeping too much: 3 Feeling tired or having little energy: 3 Poor appetite or overeating (PHQ Adolescent also includes...weight loss): 1 Feeling bad about yourself - or that you are a failure or have let yourself or your family down: 0 Trouble concentrating on things, such as reading the newspaper or watching television (PHQ Adolescent also includes...like school work): 0 Moving or speaking so slowly that other people could have noticed. Or the opposite - being so fidgety or restless that you have been moving around a lot more than usual: 0 Thoughts that you would be better off dead, or of hurting yourself in some way: 0 PHQ-9 Total Score: 9 If you checked off any problems, how difficult have these problems  made it for you to do your work, take care of things at home, or get along with other people?: Not difficult at all  Depression Treatment Depression Interventions/Treatment : Medication; Currently on Treatment     Goals Addressed             This Visit's Progress    Maintain health and independence   On track            Objective:    Today's Vitals   08/04/24 1156  Weight: 225 lb (102.1 kg)  Height: 5' 3.5 (1.613 m)   Body mass index is 39.23 kg/m.  Hearing/Vision screen Hearing Screening - Comments:: Patient is able to hear conversational tones without difficulty. No issues reported.   Vision Screening - Comments:: Wears rx glasses - up to date with routine eye exams with Dr. Waylan  Immunizations and Health Maintenance Health Maintenance  Topic Date Due   Zoster Vaccines- Shingrix (1 of 2) Never done   FOOT EXAM  07/27/2024   COVID-19 Vaccine (4 - 2025-26 season) 08/19/2024 (Originally 04/26/2024)   OPHTHALMOLOGY EXAM  09/13/2024 (Originally 04/01/2024)   Mammogram  11/21/2024 (Originally 08/01/2023)   Bone Density Scan  05/24/2025  (Originally 06/15/2022)   Diabetic kidney evaluation - Urine ACR  12/16/2024   HEMOGLOBIN A1C  01/16/2025   Diabetic kidney evaluation - eGFR measurement  07/19/2025   Medicare Annual Wellness (AWV)  08/04/2025   Fecal DNA (Cologuard)  12/31/2026   DTaP/Tdap/Td (3 - Tdap) 10/23/2027   Pneumococcal Vaccine: 50+ Years  Completed   Influenza Vaccine  Completed   Hepatitis C Screening  Completed   Meningococcal B Vaccine  Aged Out   Colonoscopy  Discontinued        Assessment/Plan:  This is a routine wellness examination for Kristina Webb.  Patient Care Team: Kayla Jeoffrey RAMAN, FNP as PCP - General (Family Medicine) Lonni Slain, MD as PCP - Cardiology (Cardiology) Harden Jerona GAILS, MD as Consulting Physician (Orthopedic Surgery) Love, Lynwood HERO, MD as Consulting Physician (Neurology) Gastroenterology, Margarete as Consulting Physician (Gastroenterology) Waylan Cain, MD as Consulting Physician (Ophthalmology) Genelle Standing, MD as Consulting Physician (Orthopedic Surgery)  I have personally reviewed and noted the following in the patients chart:   Medical and social history Use of alcohol, tobacco or illicit drugs  Current medications and supplements including opioid prescriptions. Functional ability and status Nutritional status Physical activity Advanced directives List of other physicians Hospitalizations, surgeries, and ER visits in previous 12 months Vitals Screenings to include cognitive, depression, and falls Referrals and appointments  No orders of the defined types were placed in this encounter.  In addition, I have reviewed and discussed with patient certain preventive protocols, quality metrics, and best practice recommendations. A written personalized care plan for preventive services as well as general preventive health recommendations were provided to patient.   Lavelle Charmaine Browner, LPN   [id number]   Return in 1 year (on 08/04/2025).  After Visit  Summary: (MyChart) Due to this being a telephonic visit, the after visit summary with patients personalized plan was offered to patient via MyChart   Nurse Notes: HM Addressed: Patient states mammogram scheduled 07/2024 with [facility]; she will also schedule Dexa to be done at [facility].  Information provided on Shingrix for patient to review and consider before next office visit.  Records requested for last diabetic eye exam

## 2024-08-04 NOTE — Patient Instructions (Signed)
 Ms. Labrador,  Thank you for taking the time for your Medicare Wellness Visit. I appreciate your continued commitment to your health goals. Please review the care plan we discussed, and feel free to reach out if I can assist you further.  Please note that Annual Wellness Visits do not include a physical exam. Some assessments may be limited, especially if the visit was conducted virtually. If needed, we may recommend an in-person follow-up with your provider.  Ongoing Care Seeing your primary care provider every 3 to 6 months helps us  monitor your health and provide consistent, personalized care.   Referrals If a referral was made during today's visit and you haven't received any updates within two weeks, please contact the referred provider directly to check on the status.  You have an order for:  []   2D Mammogram  []   3D Mammogram  [x]   Bone Density     Please call for appointment:   [facility] [address] [place], [place] [address] [phone]   Make sure to wear two-piece clothing.  No lotions, powders, or deodorants the day of the appointment. Make sure to bring picture ID and insurance card.  Bring list of medications you are currently taking including any supplements.   Recommended Screenings:  Health Maintenance  Topic Date Due   Zoster (Shingles) Vaccine (1 of 2) Never done   Complete foot exam   07/27/2024   COVID-19 Vaccine (4 - 2025-26 season) 08/19/2024*   Eye exam for diabetics  09/13/2024*   Breast Cancer Screening  11/21/2024*   Osteoporosis screening with Bone Density Scan  05/24/2025*   Yearly kidney health urinalysis for diabetes  12/16/2024   Hemoglobin A1C  01/16/2025   Yearly kidney function blood test for diabetes  07/19/2025   Medicare Annual Wellness Visit  08/04/2025   Cologuard (Stool DNA test)  12/31/2026   DTaP/Tdap/Td vaccine (3 - Tdap) 10/23/2027   Pneumococcal Vaccine for age over 51  Completed   Flu Shot  Completed    Hepatitis C Screening  Completed   Meningitis B Vaccine  Aged Out   Colon Cancer Screening  Discontinued  *Topic was postponed. The date shown is not the original due date.       08/04/2024   11:57 AM  Advanced Directives  Does Patient Have a Medical Advance Directive? No  Would patient like information on creating a medical advance directive? Yes (MAU/Ambulatory/Procedural Areas - Information given)   Information on Advanced Care Planning can be found at [facility]  [employer] [facility] Advance Health Care Directives Advance Health Care Directives ([email or web address]   Vision: Annual vision screenings are recommended for early detection of glaucoma, cataracts, and diabetic retinopathy. These exams can also reveal signs of chronic conditions such as diabetes and high blood pressure.  Dental: Annual dental screenings help detect early signs of oral cancer, gum disease, and other conditions linked to overall health, including heart disease and diabetes.  Please see the attached documents for additional preventive care recommendations.

## 2024-08-06 ENCOUNTER — Ambulatory Visit (HOSPITAL_COMMUNITY): Admission: RE | Admit: 2024-08-06 | Discharge: 2024-08-06 | Attending: Physician Assistant

## 2024-08-06 DIAGNOSIS — R079 Chest pain, unspecified: Secondary | ICD-10-CM

## 2024-08-06 MED ORDER — IOHEXOL 350 MG/ML SOLN
100.0000 mL | Freq: Once | INTRAVENOUS | Status: AC | PRN
  Administered 2024-08-06: 100 mL via INTRAVENOUS

## 2024-08-06 MED ORDER — NITROGLYCERIN 0.4 MG SL SUBL
0.8000 mg | SUBLINGUAL_TABLET | Freq: Once | SUBLINGUAL | Status: AC
  Administered 2024-08-06: 0.8 mg via SUBLINGUAL

## 2024-08-09 ENCOUNTER — Ambulatory Visit: Payer: Self-pay | Admitting: Physician Assistant

## 2024-08-09 NOTE — Telephone Encounter (Signed)
° °  Patient Name: Kristina Webb  DOB: 07/19/1956 MRN: [id number]  Primary Cardiologist: Shelda Bruckner, MD  Chart reviewed as part of pre-operative protocol coverage. Given past medical history and time since last visit, based on ACC/AHA guidelines, Kimanh Templeman is at acceptable risk for the planned procedure without further cardiovascular testing.   The patient was advised that if she develops new symptoms prior to surgery to contact our office to arrange for a follow-up visit, and she verbalized understanding.  I will route this recommendation to the requesting party via Epic fax function and remove from pre-op pool.  Please call with questions.  Anniece Bleiler, [place] 08/09/2024, 3:14 PM

## 2024-08-09 NOTE — Telephone Encounter (Signed)
 I have called Mrs. Kristina Webb and explained the CT result myself.

## 2024-08-11 NOTE — Telephone Encounter (Signed)
 Clearance note Sir.

## 2024-08-15 ENCOUNTER — Other Ambulatory Visit: Payer: Self-pay | Admitting: Nurse Practitioner

## 2024-08-15 ENCOUNTER — Other Ambulatory Visit: Payer: Self-pay | Admitting: Family Medicine

## 2024-08-15 DIAGNOSIS — I10 Essential (primary) hypertension: Secondary | ICD-10-CM

## 2024-08-17 ENCOUNTER — Ambulatory Visit: Admitting: Gastroenterology

## 2024-08-17 ENCOUNTER — Encounter: Payer: Self-pay | Admitting: Gastroenterology

## 2024-08-17 VITALS — BP 105/49 | HR 66 | Resp 18

## 2024-08-17 DIAGNOSIS — K3189 Other diseases of stomach and duodenum: Secondary | ICD-10-CM

## 2024-08-17 DIAGNOSIS — K2950 Unspecified chronic gastritis without bleeding: Secondary | ICD-10-CM | POA: Diagnosis not present

## 2024-08-17 DIAGNOSIS — K2289 Other specified disease of esophagus: Secondary | ICD-10-CM

## 2024-08-17 DIAGNOSIS — Z1211 Encounter for screening for malignant neoplasm of colon: Secondary | ICD-10-CM | POA: Diagnosis not present

## 2024-08-17 DIAGNOSIS — D3A092 Benign carcinoid tumor of the stomach: Secondary | ICD-10-CM

## 2024-08-17 DIAGNOSIS — D123 Benign neoplasm of transverse colon: Secondary | ICD-10-CM

## 2024-08-17 DIAGNOSIS — K5730 Diverticulosis of large intestine without perforation or abscess without bleeding: Secondary | ICD-10-CM

## 2024-08-17 DIAGNOSIS — K642 Third degree hemorrhoids: Secondary | ICD-10-CM

## 2024-08-17 DIAGNOSIS — Z860100 Personal history of colon polyps, unspecified: Secondary | ICD-10-CM

## 2024-08-17 DIAGNOSIS — D122 Benign neoplasm of ascending colon: Secondary | ICD-10-CM

## 2024-08-17 DIAGNOSIS — K259 Gastric ulcer, unspecified as acute or chronic, without hemorrhage or perforation: Secondary | ICD-10-CM

## 2024-08-17 DIAGNOSIS — D120 Benign neoplasm of cecum: Secondary | ICD-10-CM | POA: Diagnosis not present

## 2024-08-17 DIAGNOSIS — K219 Gastro-esophageal reflux disease without esophagitis: Secondary | ICD-10-CM | POA: Diagnosis not present

## 2024-08-17 DIAGNOSIS — R1319 Other dysphagia: Secondary | ICD-10-CM

## 2024-08-17 DIAGNOSIS — R1310 Dysphagia, unspecified: Secondary | ICD-10-CM | POA: Diagnosis not present

## 2024-08-17 MED ORDER — SODIUM CHLORIDE 0.9 % IV SOLN: 500.0000 mL | INTRAVENOUS | Status: DC

## 2024-08-17 MED ORDER — PANTOPRAZOLE SODIUM 40 MG PO TBEC: 40.0000 mg | DELAYED_RELEASE_TABLET | Freq: Two times a day (BID) | ORAL | 3 refills | Status: AC

## 2024-08-17 NOTE — Progress Notes (Signed)
 "  GASTROENTEROLOGY PROCEDURE H&P NOTE   Primary Care Physician: Kayla Jeoffrey RAMAN, FNP  HPI: Kristina Webb is a 68 y.o. female who presents for EGD/colonoscopy for evaluation of dysphagia and GERD and atypical chest pain with history of prior colon polyps.  Past Medical History:  Diagnosis Date   Anemia    hx   Anxiety    Arthritis    back   Asthma    Depression    Deviated septum    GERD (gastroesophageal reflux disease)    Headache    History of hiatal hernia    Hyperlipemia    Hypertension    Hypothyroidism    OSA (obstructive sleep apnea)    does not use -last 6 months   Pre-diabetes    per pt   Past Surgical History:  Procedure Laterality Date   ANKLE GANGLION CYST EXCISION Left    BACK SURGERY     L3-4 decompression   birth mark treatments     BREAST LUMPECTOMY WITH RADIOACTIVE SEED LOCALIZATION Left 02/02/2016   Procedure: LEFT BREAST LUMPECTOMY WITH RADIOACTIVE SEED LOCALIZATION;  Surgeon: Deward Null III, MD;  Location: [facility] OR;  Service: General;  Laterality: Left;   BREAST SURGERY Right    papilloma removed,cyst lft   CATARACT EXTRACTION Right 12/2016   Dr. Leora   CHOLECYSTECTOMY N/A 02/20/2017   Procedure: LAPAROSCOPIC CHOLECYSTECTOMY WITH INTRAOPERATIVE CHOLANGIOGRAM;  Surgeon: Null Deward MOULD, MD;  Location: [facility] OR;  Service: General;  Laterality: N/A;   COLON SURGERY  2008   resection- endometriosis   Current Outpatient Medications  Medication Sig Dispense Refill   ONETOUCH VERIO test strip every morning.     acetaminophen  (TYLENOL ) 500 MG tablet Take 1,000 mg by mouth every 6 (six) hours as needed for mild pain, moderate pain or headache.     albuterol  (VENTOLIN  HFA) 108 (90 Base) MCG/ACT inhaler Inhale 2 puffs into the lungs every 4 (four) hours as needed for wheezing or shortness of breath. 8.5 each 1   amitriptyline  (ELAVIL ) 10 MG tablet Take 1 tablet (10 mg total) by mouth at bedtime as needed. 90 tablet 3   aspirin  EC 81 MG tablet Take 1 tablet (81  mg total) by mouth daily. Swallow whole. 90 tablet 3   bisoprolol  (ZEBETA ) 10 MG tablet TAKE 1 TABLET BY MOUTH EVERY DAY FOR BLOOD PRESSURE 90 tablet 3   Blood Glucose Monitoring Suppl DEVI Use to check blood sugar fasting each morning. May substitute to any manufacturer covered by patient's insurance. Insurance covers One Touch. 1 each 0   Budeson-Glycopyrrol-Formoterol  (BREZTRI  AEROSPHERE) 160-9-4.8 MCG/ACT AERO Inhale 2 puffs into the lungs in the morning and at bedtime. Rinse mouth/gargle after each use. 5.9 g 2   clotrimazole -betamethasone  (LOTRISONE ) cream Apply 1 Application topically 2 (two) times daily. 15 g 2   estradiol  (ESTRACE ) 0.1 MG/GM vaginal cream as needed.     ferrous sulfate  325 (65 FE) MG tablet TAKE 1 TABLET BY MOUTH EVERY DAY 90 tablet 0   fluticasone -salmeterol (ADVAIR DISKUS) 100-50 MCG/ACT AEPB Inhale into the lungs.     furosemide  (LASIX ) 40 MG tablet TAKE 1 TABLET DAILY FOR BLOOD PRESSURE & FLUID RETENTION / ANKLE SWELLING 90 tablet 3   glipiZIDE  (GLUCOTROL  XL) 2.5 MG 24 hr tablet Take 1 tablet (2.5 mg total) by mouth daily with breakfast. 90 tablet 3   Glucose Blood (BLOOD GLUCOSE TEST STRIPS) STRP Use to check blood sugar, fasting each morning. May substitute to any manufacturer covered by patient's insurance.  Insurance covers One Touch. 100 strip 6   ipratropium (ATROVENT ) 0.03 % nasal spray PLACE 2 SPRAYS INTO THE NOSE 3 (THREE) TIMES DAILY. 90 mL 1   Lancets MISC Use to check blood sugar once daily. Dx: E11.22, N18.2 100 each 2   levothyroxine  (SYNTHROID ) 100 MCG tablet TAKE 1 TABLET (100 MCG TOTAL) BY MOUTH DAILY BEFORE BREAKFAST. TAKE 1 TABLET DAILY ON AN EMPTY STOMACH WITH ONLY WATER FOR 30 MINUTES & NO ANTACID MEDS, CALCIUM  OR MAGNESIUM FOR 4 HOURS & AVOID BIOTIN 30 tablet 1   methocarbamol  (ROBAXIN ) 500 MG tablet Take 1 tablet (500 mg total) by mouth every 8 (eight) hours as needed for muscle spasms. 60 tablet 0   montelukast  (SINGULAIR ) 10 MG tablet TAKE 1 TABLET  BY MOUTH DAILY FOR ALLERGIES 90 tablet 1   Na Sulfate-K Sulfate-Mg Sulfate concentrate (SUPREP) 17.5-3.13-1.6 GM/177ML SOLN Take 1 kit by mouth once.     NEOMYCIN -POLYMYXIN-HYDROCORTISONE  (CORTISPORIN) 1 % SOLN OTIC solution Place 3 drops into both ears every 6 (six) hours. 10 mL 1   olmesartan  (BENICAR ) 40 MG tablet TAKE 1 TABLET BY MOUTH DAILY. TAKE 1 TABLET DAILY FOR BLOOD PRESSURE & DIABETIC KIDNEY PROTECTION 90 tablet 1   pantoprazole  (PROTONIX ) 40 MG tablet Take 1 tablet Daily for Heartburn & Indigestion 90 tablet 1   rosuvastatin  (CRESTOR ) 10 MG tablet TAKE 1 TABLET BY MOUTH 3 TIMES A WEEK FOR CHOLESTEROL 36 tablet 3   sertraline  (ZOLOFT ) 100 MG tablet TAKE 1 TABLET DAILY FOR DEPRESSION/ANXIETY 90 tablet 3   sertraline  (ZOLOFT ) 25 MG tablet TAKE 1 TABLET (25 MG TOTAL) BY MOUTH DAILY. 90 tablet 2   Vitamin D , Ergocalciferol , (DRISDOL ) 1.25 MG (50000 UNIT) CAPS capsule TAKE 1 CAPSULE BY MOUTH 2 DAYS A WEEK 24 capsule 2   Current Facility-Administered Medications  Medication Dose Route Frequency Provider Last Rate Last Admin   Triamcinolone  Acetonide (ZILRETTA ) intra-articular injection 32 mg  32 mg Intra-articular Once        Current Medications[1] Allergies[2] Family History  Problem Relation Age of Onset   Emphysema Mother        smoked   Allergies Mother    Asthma Mother    Heart disease Mother    Breast cancer Mother        with mets to Bone   Prostate cancer Father    Brain cancer Father    Hypertension Brother    Thyroid  disease Brother    Rheumatic fever Brother    Heart disease Brother    Hypertension Brother    Colon cancer Paternal Grandmother    Transient ischemic attack Sister    Heart disease Sister    Allergic rhinitis Neg Hx    Angioedema Neg Hx    Eczema Neg Hx    Immunodeficiency Neg Hx    Urticaria Neg Hx    Social History   Socioeconomic History   Marital status: Married    Spouse name: Not on file   Number of children: 0   Years of education:  Not on file   Highest education level: 12th grade  Occupational History   Occupation: retired  Tobacco Use   Smoking status: Never   Smokeless tobacco: Never   Tobacco comments:    husband smokes in the house  Vaping Use   Vaping status: Never Used  Substance and Sexual Activity   Alcohol use: No   Drug use: No   Sexual activity: Yes    Partners: Male    Birth  control/protection: None, Post-menopausal  Other Topics Concern   Not on file  Social History Narrative   Drinks about 1/2 can soda daily.   Social Drivers of Health   Tobacco Use: Low Risk (08/04/2024)   Patient History    Smoking Tobacco Use: Never    Smokeless Tobacco Use: Never    Passive Exposure: Not on file  Financial Resource Strain: High Risk (12/17/2023)   Overall Financial Resource Strain (CARDIA)    Difficulty of Paying Living Expenses: Hard  Food Insecurity: No Food Insecurity (08/04/2024)   Epic    Worried About Programme Researcher, Broadcasting/film/video in the Last Year: Never true    Ran Out of Food in the Last Year: Never true  Transportation Needs: No Transportation Needs (08/04/2024)   Epic    Lack of Transportation (Medical): No    Lack of Transportation (Non-Medical): No  Physical Activity: Insufficiently Active (08/04/2024)   Exercise Vital Sign    Days of Exercise per Week: 3 days    Minutes of Exercise per Session: 30 min  Stress: Stress Concern Present (08/04/2024)   [employer] of Occupational Health - Occupational Stress Questionnaire    Feeling of Stress: To some extent  Social Connections: Moderately Integrated (08/04/2024)   Social Connection and Isolation Panel    Frequency of Communication with Friends and Family: More than three times a week    Frequency of Social Gatherings with Friends and Family: Three times a week    Attends Religious Services: More than 4 times per year    Active Member of Clubs or Organizations: No    Attends Banker Meetings: Never    Marital Status:  Married  Catering Manager Violence: Not At Risk (08/04/2024)   Epic    Fear of Current or Ex-Partner: No    Emotionally Abused: No    Physically Abused: No    Sexually Abused: No  Depression (PHQ2-9): Medium Risk (08/04/2024)   Depression (PHQ2-9)    PHQ-2 Score: 9  Alcohol Screen: Not on file  Housing: Low Risk (08/04/2024)   Epic    Unable to Pay for Housing in the Last Year: No    Number of Times Moved in the Last Year: 0    Homeless in the Last Year: No  Utilities: Not At Risk (08/04/2024)   Epic    Threatened with loss of utilities: No  Health Literacy: Adequate Health Literacy (08/04/2024)   B1300 Health Literacy    Frequency of need for help with medical instructions: Never    Physical Exam: There were no vitals filed for this visit. There is no height or weight on file to calculate BMI. GEN: NAD EYE: Sclerae anicteric ENT: MMM CV: Non-tachycardic GI: Soft, NT/ND NEURO:  Alert & Oriented x 3  Lab Results: No results for input(s): WBC, HGB, HCT, PLT in the last 72 hours. BMET No results for input(s): NA, K, CL, CO2, GLUCOSE, BUN, CREATININE, CALCIUM  in the last 72 hours. LFT No results for input(s): PROT, ALBUMIN, AST, ALT, ALKPHOS, BILITOT, BILIDIR, IBILI in the last 72 hours. PT/INR No results for input(s): LABPROT, INR in the last 72 hours.   Impression / Plan: This is a 68 y.o.female who presents for EGD/colonoscopy for evaluation of dysphagia and GERD and atypical chest pain with history of prior colon polyps.  The risks and benefits of endoscopic evaluation/treatment were discussed with the patient and/or family; these include but are not limited to the risk of perforation, infection, bleeding, missed lesions,  lack of diagnosis, severe illness requiring hospitalization, as well as anesthesia and sedation related illnesses.  The patient's history has been reviewed, patient examined, no change in status, and deemed  stable for procedure.  The patient and/or family was provided an opportunity to ask questions and all were answered.  The patient and/or family is agreeable to proceed.    Aloha Finner, MD [facility] Office # [phone]    [1]  Current Outpatient Medications:    ONETOUCH VERIO test strip, every morning., Disp: , Rfl:    acetaminophen  (TYLENOL ) 500 MG tablet, Take 1,000 mg by mouth every 6 (six) hours as needed for mild pain, moderate pain or headache., Disp: , Rfl:    albuterol  (VENTOLIN  HFA) 108 (90 Base) MCG/ACT inhaler, Inhale 2 puffs into the lungs every 4 (four) hours as needed for wheezing or shortness of breath., Disp: 8.5 each, Rfl: 1   amitriptyline  (ELAVIL ) 10 MG tablet, Take 1 tablet (10 mg total) by mouth at bedtime as needed., Disp: 90 tablet, Rfl: 3   aspirin  EC 81 MG tablet, Take 1 tablet (81 mg total) by mouth daily. Swallow whole., Disp: 90 tablet, Rfl: 3   bisoprolol  (ZEBETA ) 10 MG tablet, TAKE 1 TABLET BY MOUTH EVERY DAY FOR BLOOD PRESSURE, Disp: 90 tablet, Rfl: 3   Blood Glucose Monitoring Suppl DEVI, Use to check blood sugar fasting each morning. May substitute to any manufacturer covered by patient's insurance. Insurance covers One Touch., Disp: 1 each, Rfl: 0   Budeson-Glycopyrrol-Formoterol  (BREZTRI  AEROSPHERE) 160-9-4.8 MCG/ACT AERO, Inhale 2 puffs into the lungs in the morning and at bedtime. Rinse mouth/gargle after each use., Disp: 5.9 g, Rfl: 2   clotrimazole -betamethasone  (LOTRISONE ) cream, Apply 1 Application topically 2 (two) times daily., Disp: 15 g, Rfl: 2   estradiol  (ESTRACE ) 0.1 MG/GM vaginal cream, as needed., Disp: , Rfl:    ferrous sulfate  325 (65 FE) MG tablet, TAKE 1 TABLET BY MOUTH EVERY DAY, Disp: 90 tablet, Rfl: 0   fluticasone -salmeterol (ADVAIR DISKUS) 100-50 MCG/ACT AEPB, Inhale into the lungs., Disp: , Rfl:    furosemide  (LASIX ) 40 MG tablet, TAKE 1 TABLET DAILY FOR BLOOD PRESSURE & FLUID RETENTION / ANKLE  SWELLING, Disp: 90 tablet, Rfl: 3   glipiZIDE  (GLUCOTROL  XL) 2.5 MG 24 hr tablet, Take 1 tablet (2.5 mg total) by mouth daily with breakfast., Disp: 90 tablet, Rfl: 3   Glucose Blood (BLOOD GLUCOSE TEST STRIPS) STRP, Use to check blood sugar, fasting each morning. May substitute to any manufacturer covered by patient's insurance. Insurance covers One Touch., Disp: 100 strip, Rfl: 6   ipratropium (ATROVENT ) 0.03 % nasal spray, PLACE 2 SPRAYS INTO THE NOSE 3 (THREE) TIMES DAILY., Disp: 90 mL, Rfl: 1   Lancets MISC, Use to check blood sugar once daily. Dx: E11.22, N18.2, Disp: 100 each, Rfl: 2   levothyroxine  (SYNTHROID ) 100 MCG tablet, TAKE 1 TABLET (100 MCG TOTAL) BY MOUTH DAILY BEFORE BREAKFAST. TAKE 1 TABLET DAILY ON AN EMPTY STOMACH WITH ONLY WATER FOR 30 MINUTES & NO ANTACID MEDS, CALCIUM  OR MAGNESIUM FOR 4 HOURS & AVOID BIOTIN, Disp: 30 tablet, Rfl: 1   methocarbamol  (ROBAXIN ) 500 MG tablet, Take 1 tablet (500 mg total) by mouth every 8 (eight) hours as needed for muscle spasms., Disp: 60 tablet, Rfl: 0   montelukast  (SINGULAIR ) 10 MG tablet, TAKE 1 TABLET BY MOUTH DAILY FOR ALLERGIES, Disp: 90 tablet, Rfl: 1   Na Sulfate-K Sulfate-Mg Sulfate concentrate (SUPREP) 17.5-3.13-1.6 GM/177ML SOLN, Take 1 kit by mouth  once., Disp: , Rfl:    NEOMYCIN -POLYMYXIN-HYDROCORTISONE  (CORTISPORIN) 1 % SOLN OTIC solution, Place 3 drops into both ears every 6 (six) hours., Disp: 10 mL, Rfl: 1   olmesartan  (BENICAR ) 40 MG tablet, TAKE 1 TABLET BY MOUTH DAILY. TAKE 1 TABLET DAILY FOR BLOOD PRESSURE & DIABETIC KIDNEY PROTECTION, Disp: 90 tablet, Rfl: 1   pantoprazole  (PROTONIX ) 40 MG tablet, Take 1 tablet Daily for Heartburn & Indigestion, Disp: 90 tablet, Rfl: 1   rosuvastatin  (CRESTOR ) 10 MG tablet, TAKE 1 TABLET BY MOUTH 3 TIMES A WEEK FOR CHOLESTEROL, Disp: 36 tablet, Rfl: 3   sertraline  (ZOLOFT ) 100 MG tablet, TAKE 1 TABLET DAILY FOR DEPRESSION/ANXIETY, Disp: 90 tablet, Rfl: 3   sertraline  (ZOLOFT ) 25 MG tablet,  TAKE 1 TABLET (25 MG TOTAL) BY MOUTH DAILY., Disp: 90 tablet, Rfl: 2   Vitamin D , Ergocalciferol , (DRISDOL ) 1.25 MG (50000 UNIT) CAPS capsule, TAKE 1 CAPSULE BY MOUTH 2 DAYS A WEEK, Disp: 24 capsule, Rfl: 2  Current Facility-Administered Medications:    Triamcinolone  Acetonide (ZILRETTA ) intra-articular injection 32 mg, 32 mg, Intra-articular, Once,  [2]  Allergies Allergen Reactions   Levaquin [Levofloxacin In D5w] Other (See Comments)    Ankles hurt really bad ? ACHILLES TENDON ?   Doxycycline Other (See Comments)    UNSPECIFIED REACTION    Dust Mite Extract Other (See Comments)   "

## 2024-08-17 NOTE — Op Note (Signed)
 [facility] Patient Name: Kristina Webb Procedure Date: 08/17/2024 12:49 PM MRN: [id number] Endoscopist: Aloha Finner , MD, [id number] Age: 68 Referring MD:  Date of Birth: 12-28-1955 Gender: Female Account #: [id number] Procedure:                Colonoscopy Indications:              High risk colon cancer surveillance: Personal                            history of colonic polyps Medicines:                Monitored Anesthesia Care Procedure:                Pre-Anesthesia Assessment:                           - Prior to the procedure, a History and Physical                            was performed, and patient medications and                            allergies were reviewed. The patient's tolerance of                            previous anesthesia was also reviewed. The risks                            and benefits of the procedure and the sedation                            options and risks were discussed with the patient.                            All questions were answered, and informed consent                            was obtained. Prior Anticoagulants: The patient has                            taken no anticoagulant or antiplatelet agents                            except for aspirin . ASA Grade Assessment: II - A                            patient with mild systemic disease. After reviewing                            the risks and benefits, the patient was deemed in                            satisfactory condition to undergo the procedure.  After obtaining informed consent, the colonoscope                            was passed under direct vision. Throughout the                            procedure, the patient's blood pressure, pulse, and                            oxygen saturations were monitored continuously. The                            CF [id number] #[id number] was introduced through the anus                            and  advanced to the the cecum, identified by                            appendiceal orifice and ileocecal valve. The                            colonoscopy was performed without difficulty. The                            patient tolerated the procedure. The quality of the                            bowel preparation was adequate. The ileocecal                            valve, appendiceal orifice, and rectum were                            photographed. Scope In: 1:32:19 PM Scope Out: 1:52:01 PM Scope Withdrawal Time: 0 hours 15 minutes 39 seconds  Total Procedure Duration: 0 hours 19 minutes 42 seconds  Findings:                 The digital rectal exam findings include                            hemorrhoids. Pertinent negatives include no                            palpable rectal lesions.                           The left colon was moderately tortuous.                           12 sessile polyps were found in the transverse                            colon (6), ascending colon (4) and cecum (1). The  polyps were 2 to 10 mm in size. These polyps were                            removed with a cold snare. Resection and retrieval                            were complete.                           Multiple small-mouthed diverticula were found in                            the recto-sigmoid colon and sigmoid colon.                           Normal mucosa was found in the entire colon                            otherwise.                           Non-bleeding non-thrombosed internal hemorrhoids                            were found during retroflexion, during perianal                            exam and during digital exam. The hemorrhoids were                            Grade III (internal hemorrhoids that prolapse but                            require manual reduction). Complications:            No immediate complications. Estimated Blood Loss:     Estimated blood  loss was minimal. Impression:               - Hemorrhoids found on digital rectal exam.                           - Tortuous colon.                           - 12 2 to 10 mm polyps in the transverse colon, in                            the ascending colon and in the cecum, removed with                            a cold snare. Resected and retrieved.                           - Diverticulosis in the recto-sigmoid colon and in  the sigmoid colon.                           - Normal mucosa in the entire examined colon                            otherwise.                           - Non-bleeding non-thrombosed internal hemorrhoids. Recommendation:           - The patient will be observed post-procedure,                            until all discharge criteria are met.                           - Discharge patient to home.                           - Patient has a contact number available for                            emergencies. The signs and symptoms of potential                            delayed complications were discussed with the                            patient. Return to normal activities tomorrow.                            Written discharge instructions were provided to the                            patient.                           - High fiber diet.                           - Use FiberCon 1-2 tablets PO daily.                           - Continue present medications.                           - Await pathology results.                           - Repeat colonoscopy in 1 year for surveillance.                           - The findings and recommendations were discussed                            with the patient.                           -  The findings and recommendations were discussed                            with the patient's family. Aloha Finner, MD 08/17/2024 2:02:23 PM

## 2024-08-17 NOTE — Patient Instructions (Addendum)
 Await pathology results.   Follow dilation diet today (handout provided).   Please use Cepacol or Halls Lozenges +/- Chloraseptic spray for next 72-96 hours to aid in sore throat if you experience this.   Increase Protonix  to 40mg  twice a day.  High fiber diet. Use FiberCon 1-2 tablets PO daily.   Repeat Colonoscopy in 1 year for surveillance.   Repeat upper endoscopy in 4 months to assess for healing.   YOU HAD AN ENDOSCOPIC PROCEDURE TODAY AT THE [facility]:   Refer to the procedure report that was given to you for any specific questions about what was found during the examination.  If the procedure report does not answer your questions, please call your gastroenterologist to clarify.  If you requested that your care partner not be given the details of your procedure findings, then the procedure report has been included in a sealed envelope for you to review at your convenience later.  YOU SHOULD EXPECT: Some feelings of bloating in the abdomen. Passage of more gas than usual.  Walking can help get rid of the air that was put into your GI tract during the procedure and reduce the bloating. If you had a lower endoscopy (such as a colonoscopy or flexible sigmoidoscopy) you may notice spotting of blood in your stool or on the toilet paper. If you underwent a bowel prep for your procedure, you may not have a normal bowel movement for a few days.  Please Note:  You might notice some irritation and congestion in your nose or some drainage.  This is from the oxygen used during your procedure.  There is no need for concern and it should clear up in a day or so.  SYMPTOMS TO REPORT IMMEDIATELY:  Following lower endoscopy (colonoscopy or flexible sigmoidoscopy):  Excessive amounts of blood in the stool  Significant tenderness or worsening of abdominal pains  Swelling of the abdomen that is new, acute  Fever of 100F or higher  Following upper endoscopy (EGD)  Vomiting of blood or  coffee ground material  New chest pain or pain under the shoulder blades  Painful or persistently difficult swallowing  New shortness of breath  Fever of 100F or higher  Black, tarry-looking stools  For urgent or emergent issues, a gastroenterologist can be reached at any hour by calling [phone]. Do not use MyChart messaging for urgent concerns.    DIET:  We do recommend a small meal at first, but then you may proceed to your regular diet.  Drink plenty of fluids but you should avoid alcoholic beverages for 24 hours.  ACTIVITY:  You should plan to take it easy for the rest of today and you should NOT DRIVE or use heavy machinery until tomorrow (because of the sedation medicines used during the test).    FOLLOW UP: Our staff will call the number listed on your records the next business day following your procedure.  We will call around 7:15- 8:00 am to check on you and address any questions or concerns that you may have regarding the information given to you following your procedure. If we do not reach you, we will leave a message.     If any biopsies were taken you will be contacted by phone or by letter within the next 1-3 weeks.  Please call us  at [phone] if you have not heard about the biopsies in 3 weeks.    SIGNATURES/CONFIDENTIALITY: You and/or your care partner have signed paperwork which will  be entered into your electronic medical record.  These signatures attest to the fact that that the information above on your After Visit Summary has been reviewed and is understood.  Full responsibility of the confidentiality of this discharge information lies with you and/or your care-partner.

## 2024-08-17 NOTE — Progress Notes (Signed)
 Report to PACU, RN, vss, BBS= Clear.

## 2024-08-17 NOTE — Op Note (Signed)
 [facility] Patient Name: Kristina Webb Procedure Date: 08/17/2024 12:50 PM MRN: [id number] Endoscopist: Aloha Finner , MD, [id number] Age: 68 Referring MD:  Date of Birth: Jul 02, 1956 Gender: Female Account #: [id number] Procedure:                Upper GI endoscopy Indications:              Dysphagia, Heartburn, Chest pain (non cardiac) Medicines:                Monitored Anesthesia Care Procedure:                Pre-Anesthesia Assessment:                           - Prior to the procedure, a History and Physical                            was performed, and patient medications and                            allergies were reviewed. The patient's tolerance of                            previous anesthesia was also reviewed. The risks                            and benefits of the procedure and the sedation                            options and risks were discussed with the patient.                            All questions were answered, and informed consent                            was obtained. Prior Anticoagulants: The patient has                            taken no anticoagulant or antiplatelet agents                            except for aspirin . ASA Grade Assessment: II - A                            patient with mild systemic disease. After reviewing                            the risks and benefits, the patient was deemed in                            satisfactory condition to undergo the procedure.                           After obtaining informed consent, the endoscope was  passed under direct vision. Throughout the                            procedure, the patient's blood pressure, pulse, and                            oxygen saturations were monitored continuously. The                            GIF [id number] #[id number] was introduced through the                            mouth, and advanced to the second part of duodenum.                             The upper GI endoscopy was accomplished without                            difficulty. The patient tolerated the procedure. Scope In: Scope Out: Findings:                 No gross lesions were noted in the entire                            esophagus. Biopsies were taken with a cold forceps                            for histology. After the rest of the EGD was                            completed, a guidewire was placed and the scope was                            withdrawn. Dilation was performed with a Savary                            dilator with mild resistance at 18 mm. The dilation                            site was examined following endoscope reinsertion                            and showed just below the UES a mild mucosal                            disruption, mild improvement in luminal narrowing                            and no perforation.                           The Z-line was irregular and was found 35 cm from  the incisors.                           A 3 cm hiatal hernia was present.                           One non-bleeding linear gastric ulcer was found at                            the incisura. The lesion was 10 mm in largest                            dimension. Biopsies were taken with a cold forceps                            for histology.                           Patchy mildly erythematous mucosa without bleeding                            was found in the entire examined stomach. Biopsies                            were taken with a cold forceps for histology and                            Helicobacter pylori testing.                           No gross lesions were noted in the duodenal bulb,                            in the first portion of the duodenum and in the                            second portion of the duodenum. Complications:            No immediate complications. Estimated Blood Loss:     Estimated  blood loss was minimal. Impression:               - No gross lesions in the entire esophagus.                            Biopsied. Dilated to 18 mm Savary with mucosal                            wrent below the UES.                           - Z-line irregular, 35 cm from the incisors.                           - 3 cm hiatal hernia.                           -  Non-bleeding gastric ulcer on incisura. Biopsied.                           - Erythematous mucosa in the stomach. Biopsied.                           - No gross lesions in the duodenal bulb, in the                            first portion of the duodenum and in the second                            portion of the duodenum. Recommendation:           - Proceed to scheduled colonoscopy.                           - Dilation diet as per protocol.                           - Please use Cepacol or Halls Lozenges +/-                            Chloraseptic spray for next 72-96 hours to aid in                            sore thoat should you experience this.                           - Increase Protonix  to 40 mg twice daily.                           - Observe patient's clinical course.                           - Await pathology results.                           - Repeat upper endoscopy in 4 months to check                            healing of gastric ulcer.                           - If dysphagia symptoms persist then esophageal                            manometry should be considered.                           - The findings and recommendations were discussed                            with the patient.                           -  The findings and recommendations were discussed                            with the patient's family. Aloha Finner, MD 08/17/2024 1:59:00 PM

## 2024-08-18 ENCOUNTER — Telehealth: Payer: Self-pay | Admitting: *Deleted

## 2024-08-18 NOTE — Telephone Encounter (Signed)
" °  Follow up Call-     08/17/2024   12:40 PM  Call back number  Post procedure Call Back phone  # [phone]  Permission to leave phone message Yes     Patient questions:  Do you have a fever, pain , or abdominal swelling? No. Pain Score  0 *  Have you tolerated food without any problems? Yes.    Have you been able to return to your normal activities? Yes.    Do you have any questions about your discharge instructions: Diet   No. Medications  No. Follow up visit  No.  Do you have questions or concerns about your Care? No.  Actions: * If pain score is 4 or above: No action needed, pain <4.   "

## 2024-08-21 ENCOUNTER — Encounter: Payer: Self-pay | Admitting: Gastroenterology

## 2024-08-22 ENCOUNTER — Other Ambulatory Visit: Payer: Self-pay | Admitting: Family Medicine

## 2024-08-22 DIAGNOSIS — E039 Hypothyroidism, unspecified: Secondary | ICD-10-CM

## 2024-08-24 ENCOUNTER — Ambulatory Visit (HOSPITAL_BASED_OUTPATIENT_CLINIC_OR_DEPARTMENT_OTHER): Admitting: Family

## 2024-08-25 LAB — SURGICAL PATHOLOGY

## 2024-08-27 ENCOUNTER — Other Ambulatory Visit

## 2024-08-27 ENCOUNTER — Ambulatory Visit: Payer: Self-pay | Admitting: Gastroenterology

## 2024-08-27 DIAGNOSIS — E1169 Type 2 diabetes mellitus with other specified complication: Secondary | ICD-10-CM

## 2024-08-27 DIAGNOSIS — E611 Iron deficiency: Secondary | ICD-10-CM

## 2024-08-27 DIAGNOSIS — E559 Vitamin D deficiency, unspecified: Secondary | ICD-10-CM

## 2024-08-27 DIAGNOSIS — C169 Malignant neoplasm of stomach, unspecified: Secondary | ICD-10-CM

## 2024-08-27 DIAGNOSIS — Z7985 Long-term (current) use of injectable non-insulin antidiabetic drugs: Secondary | ICD-10-CM

## 2024-08-27 DIAGNOSIS — I10 Essential (primary) hypertension: Secondary | ICD-10-CM

## 2024-08-27 DIAGNOSIS — E039 Hypothyroidism, unspecified: Secondary | ICD-10-CM

## 2024-08-28 LAB — CBC WITH DIFFERENTIAL/PLATELET
Absolute Lymphocytes: 3074 {cells}/uL (ref 850–3900)
Absolute Monocytes: 545 {cells}/uL (ref 200–950)
Basophils Absolute: 56 {cells}/uL (ref 0–200)
Basophils Relative: 0.6 %
Eosinophils Absolute: 338 {cells}/uL (ref 15–500)
Eosinophils Relative: 3.6 %
HCT: 38.6 % (ref 35.9–46.0)
Hemoglobin: 12.1 g/dL (ref 11.7–15.5)
MCH: 27.9 pg (ref 27.0–33.0)
MCHC: 31.3 g/dL — ABNORMAL LOW (ref 31.6–35.4)
MCV: 88.9 fL (ref 81.4–101.7)
MPV: 9.9 fL (ref 7.5–12.5)
Monocytes Relative: 5.8 %
Neutro Abs: 5386 {cells}/uL (ref 1500–7800)
Neutrophils Relative %: 57.3 %
Platelets: 283 Thousand/uL (ref 140–400)
RBC: 4.34 Million/uL (ref 3.80–5.10)
RDW: 14.4 % (ref 11.0–15.0)
Total Lymphocyte: 32.7 %
WBC: 9.4 Thousand/uL (ref 3.8–10.8)

## 2024-08-28 LAB — COMPREHENSIVE METABOLIC PANEL WITH GFR
AG Ratio: 1.5 (calc) (ref 1.0–2.5)
ALT: 21 U/L (ref 6–29)
AST: 19 U/L (ref 10–35)
Albumin: 3.9 g/dL (ref 3.6–5.1)
Alkaline phosphatase (APISO): 68 U/L (ref 37–153)
BUN/Creatinine Ratio: 12 (calc) (ref 6–22)
BUN: 13 mg/dL (ref 7–25)
CO2: 32 mmol/L (ref 20–32)
Calcium: 8.8 mg/dL (ref 8.6–10.4)
Chloride: 103 mmol/L (ref 98–110)
Creat: 1.07 mg/dL — ABNORMAL HIGH (ref 0.50–1.05)
Globulin: 2.6 g/dL (ref 1.9–3.7)
Glucose, Bld: 132 mg/dL — ABNORMAL HIGH (ref 65–99)
Potassium: 4.2 mmol/L (ref 3.5–5.3)
Sodium: 143 mmol/L (ref 135–146)
Total Bilirubin: 0.3 mg/dL (ref 0.2–1.2)
Total Protein: 6.5 g/dL (ref 6.1–8.1)
eGFR: 57 mL/min/1.73m2 — ABNORMAL LOW

## 2024-08-28 LAB — IRON,TIBC AND FERRITIN PANEL
%SAT: 11 % — ABNORMAL LOW (ref 16–45)
Ferritin: 25 ng/mL (ref 16–288)
Iron: 40 ug/dL — ABNORMAL LOW (ref 45–160)
TIBC: 378 ug/dL (ref 250–450)

## 2024-08-28 LAB — VITAMIN B12: Vitamin B-12: 368 pg/mL (ref 200–1100)

## 2024-08-28 LAB — LIPID PANEL
Cholesterol: 149 mg/dL
HDL: 55 mg/dL
LDL Cholesterol (Calc): 74 mg/dL
Non-HDL Cholesterol (Calc): 94 mg/dL
Total CHOL/HDL Ratio: 2.7 (calc)
Triglycerides: 121 mg/dL

## 2024-08-28 LAB — VITAMIN D 25 HYDROXY (VIT D DEFICIENCY, FRACTURES): Vit D, 25-Hydroxy: 88 ng/mL (ref 30–100)

## 2024-08-28 LAB — TSH: TSH: 11.51 m[IU]/L — ABNORMAL HIGH (ref 0.40–4.50)

## 2024-08-30 DIAGNOSIS — C169 Malignant neoplasm of stomach, unspecified: Secondary | ICD-10-CM

## 2024-08-30 NOTE — Addendum Note (Signed)
 Addended by: ANITRA ODETTA CROME on: 08/30/2024 11:26 AM   Modules accepted: Orders

## 2024-08-30 NOTE — Telephone Encounter (Signed)
 EUS scheduled for 2/16 at 930 am at [facility] with [facility] , pt instructed and medications reviewed.  Patient instructions mailed to home.  Patient to call with any questions or concerns.

## 2024-08-31 ENCOUNTER — Ambulatory Visit: Payer: Self-pay | Admitting: Family Medicine

## 2024-08-31 ENCOUNTER — Ambulatory Visit (HOSPITAL_COMMUNITY)
Admission: RE | Admit: 2024-08-31 | Discharge: 2024-08-31 | Disposition: A | Discharge status: Home / Self Care | Source: Ambulatory Visit | Attending: Gastroenterology | Admitting: Gastroenterology

## 2024-08-31 DIAGNOSIS — C169 Malignant neoplasm of stomach, unspecified: Secondary | ICD-10-CM | POA: Diagnosis present

## 2024-08-31 MED ORDER — IOHEXOL 300 MG/ML  SOLN
100.0000 mL | Freq: Once | INTRAMUSCULAR | Status: AC | PRN
  Administered 2024-08-31: 100 mL via INTRAVENOUS

## 2024-09-02 ENCOUNTER — Ambulatory Visit: Admitting: Family Medicine

## 2024-09-08 ENCOUNTER — Encounter: Payer: Self-pay | Admitting: Family Medicine

## 2024-09-08 ENCOUNTER — Ambulatory Visit: Admitting: Family Medicine

## 2024-09-08 VITALS — BP 115/75 | HR 55 | Temp 97.6°F | Ht 63.5 in | Wt 228.0 lb

## 2024-09-08 DIAGNOSIS — K2100 Gastro-esophageal reflux disease with esophagitis, without bleeding: Secondary | ICD-10-CM

## 2024-09-08 DIAGNOSIS — E1122 Type 2 diabetes mellitus with diabetic chronic kidney disease: Secondary | ICD-10-CM

## 2024-09-08 DIAGNOSIS — N182 Chronic kidney disease, stage 2 (mild): Secondary | ICD-10-CM

## 2024-09-08 DIAGNOSIS — E611 Iron deficiency: Secondary | ICD-10-CM | POA: Diagnosis not present

## 2024-09-08 DIAGNOSIS — E039 Hypothyroidism, unspecified: Secondary | ICD-10-CM | POA: Diagnosis not present

## 2024-09-08 DIAGNOSIS — C169 Malignant neoplasm of stomach, unspecified: Secondary | ICD-10-CM | POA: Diagnosis not present

## 2024-09-08 DIAGNOSIS — Z7984 Long term (current) use of oral hypoglycemic drugs: Secondary | ICD-10-CM | POA: Diagnosis not present

## 2024-09-08 MED ORDER — LEVOTHYROXINE SODIUM 112 MCG PO TABS: 112.0000 ug | ORAL_TABLET | Freq: Every day | ORAL | 0 refills | Status: AC

## 2024-09-08 NOTE — Progress Notes (Signed)
 "  Acute Office Visit  Patient ID: Kristina Webb, female    DOB: Jan 11, 1956, 69 y.o.   MRN: [id number]  PCP: Kayla Jeoffrey RAMAN, FNP  Chief Complaint  Patient presents with   Follow-up    Review labs      Subjective:     HPI   Discussed the use of AI scribe software for clinical note transcription with the patient, who gave verbal consent to proceed.  History of Present Illness Kristina Webb is a 69 year old female with a newly diagnosed familial gastric neuroendocrine tumor who presents for follow-up on recent diagnostic studies and ongoing symptoms.  She underwent a CT scan of her pelvis and stomach last week, but has not yet received the results. 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 The tumor was not causing symptoms at the time of discovery.  She initially sought medical attention for upper GI symptoms, which led to an esophageal stretching procedure. Post-procedure, she experienced some improvement but continues to have pain and a sensation of swelling in the thyroid  area.  She has a history of diabetes and is currently taking glipizide  2.5 mg daily. She previously tried Ozempic  but discontinued it due to throat issues. Her blood sugar levels are generally around 125 mg/dL in the mornings, with occasional readings as low as 105 mg/dL. She checks her blood sugar two to three times a week.  She has hypothyroidism and is taking Synthroid  100 mcg daily. Her thyroid  levels were high in January, despite being normal in October after an increase in dosage. No biotin supplements are taken.  Her mother had bone cancer, which is relevant to her concerns about the genetic nature of her tumor. She is not currently seeing an oncologist, but she is scheduled for an endoscopic ultrasound in February to further evaluate the tumor.  Her iron levels are low, but she is not anemic. She stopped  taking iron supplements due to stomach discomfort.  In the review of symptoms, she has a persistent cough and some tenderness in the neck area. She is taking pantoprazole  twice daily, which helps alleviate soreness in the area.   Review of Systems  All other systems reviewed and are negative.   Past Medical History:  Diagnosis Date   Anemia    hx   Anxiety    Arthritis    back   Asthma    Cataract    Chronic kidney disease    Depression    Deviated septum    Diabetes mellitus without complication (HCC)    GERD (gastroesophageal reflux disease)    Headache    History of hiatal hernia    Hyperlipemia    Hypertension    Hypothyroidism    OSA (obstructive sleep apnea)    does not use -last 6 months   Pre-diabetes    per pt   Sleep apnea     Past Surgical History:  Procedure Laterality Date   ANKLE GANGLION CYST EXCISION Left    BACK SURGERY     L3-4 decompression   birth mark treatments     BREAST LUMPECTOMY WITH RADIOACTIVE SEED LOCALIZATION Left 02/02/2016   Procedure: LEFT BREAST LUMPECTOMY WITH RADIOACTIVE SEED LOCALIZATION;  Surgeon: Deward Null III, MD;  Location: [facility] OR;  Service: General;  Laterality: Left;   BREAST SURGERY Right    papilloma removed,cyst lft   CATARACT EXTRACTION Right 12/2016   Dr. Leora  CHOLECYSTECTOMY N/A 02/20/2017   Procedure: LAPAROSCOPIC CHOLECYSTECTOMY WITH INTRAOPERATIVE CHOLANGIOGRAM;  Surgeon: Curvin Deward MOULD, MD;  Location: [facility] OR;  Service: General;  Laterality: N/A;   COLON SURGERY  2008   resection- endometriosis   COLONOSCOPY     UPPER GASTROINTESTINAL ENDOSCOPY      Outpatient Medications Prior to Visit  Medication Sig Dispense Refill   acetaminophen  (TYLENOL ) 500 MG tablet Take 1,000 mg by mouth every 6 (six) hours as needed for mild pain, moderate pain or headache.     albuterol  (VENTOLIN  HFA) 108 (90 Base) MCG/ACT inhaler Inhale 2 puffs into the lungs every 4 (four) hours as needed for wheezing or shortness of breath.  8.5 each 1   amitriptyline  (ELAVIL ) 10 MG tablet Take 1 tablet (10 mg total) by mouth at bedtime as needed. 90 tablet 3   aspirin  EC 81 MG tablet Take 1 tablet (81 mg total) by mouth daily. Swallow whole. 90 tablet 3   bisoprolol  (ZEBETA ) 10 MG tablet TAKE 1 TABLET BY MOUTH EVERY DAY FOR BLOOD PRESSURE 90 tablet 3   Blood Glucose Monitoring Suppl DEVI Use to check blood sugar fasting each morning. May substitute to any manufacturer covered by patient's insurance. Insurance covers One Touch. 1 each 0   Budeson-Glycopyrrol-Formoterol  (BREZTRI  AEROSPHERE) 160-9-4.8 MCG/ACT AERO Inhale 2 puffs into the lungs in the morning and at bedtime. Rinse mouth/gargle after each use. 5.9 g 2   clotrimazole -betamethasone  (LOTRISONE ) cream Apply 1 Application topically 2 (two) times daily. 15 g 2   estradiol  (ESTRACE ) 0.1 MG/GM vaginal cream as needed.     ferrous sulfate  325 (65 FE) MG tablet TAKE 1 TABLET BY MOUTH EVERY DAY 90 tablet 0   fluticasone -salmeterol (ADVAIR DISKUS) 100-50 MCG/ACT AEPB Inhale into the lungs.     furosemide  (LASIX ) 40 MG tablet TAKE 1 TABLET DAILY FOR BLOOD PRESSURE & FLUID RETENTION / ANKLE SWELLING 90 tablet 3   glipiZIDE  (GLUCOTROL  XL) 2.5 MG 24 hr tablet Take 1 tablet (2.5 mg total) by mouth daily with breakfast. 90 tablet 3   Glucose Blood (BLOOD GLUCOSE TEST STRIPS) STRP Use to check blood sugar, fasting each morning. May substitute to any manufacturer covered by patient's insurance. Insurance covers One Touch. 100 strip 6   ipratropium (ATROVENT ) 0.03 % nasal spray PLACE 2 SPRAYS INTO THE NOSE 3 (THREE) TIMES DAILY. 90 mL 1   Lancets MISC Use to check blood sugar once daily. Dx: E11.22, N18.2 100 each 2   methocarbamol  (ROBAXIN ) 500 MG tablet Take 1 tablet (500 mg total) by mouth every 8 (eight) hours as needed for muscle spasms. 60 tablet 0   montelukast  (SINGULAIR ) 10 MG tablet TAKE 1 TABLET BY MOUTH DAILY FOR ALLERGIES 90 tablet 1   NEOMYCIN -POLYMYXIN-HYDROCORTISONE   (CORTISPORIN) 1 % SOLN OTIC solution Place 3 drops into both ears every 6 (six) hours. 10 mL 1   olmesartan  (BENICAR ) 40 MG tablet TAKE 1 TABLET BY MOUTH DAILY. TAKE 1 TABLET DAILY FOR BLOOD PRESSURE & DIABETIC KIDNEY PROTECTION 90 tablet 1   ONETOUCH VERIO test strip every morning.     pantoprazole  (PROTONIX ) 40 MG tablet Take 1 tablet (40 mg total) by mouth 2 (two) times daily. 180 tablet 3   rosuvastatin  (CRESTOR ) 10 MG tablet TAKE 1 TABLET BY MOUTH 3 TIMES A WEEK FOR CHOLESTEROL 36 tablet 3   sertraline  (ZOLOFT ) 100 MG tablet TAKE 1 TABLET DAILY FOR DEPRESSION/ANXIETY 90 tablet 3   Vitamin D , Ergocalciferol , (DRISDOL ) 1.25 MG (50000 UNIT) CAPS capsule TAKE 1  CAPSULE BY MOUTH 2 DAYS A WEEK 24 capsule 2   levothyroxine  (SYNTHROID ) 100 MCG tablet TAKE 1 TABLET (100 MCG TOTAL) BY MOUTH DAILY BEFORE BREAKFAST. TAKE 1 TABLET DAILY ON AN EMPTY STOMACH WITH ONLY WATER FOR 30 MINUTES & NO ANTACID MEDS, CALCIUM  OR MAGNESIUM FOR 4 HOURS & AVOID BIOTIN 30 tablet 1   sertraline  (ZOLOFT ) 25 MG tablet TAKE 1 TABLET (25 MG TOTAL) BY MOUTH DAILY. (Patient not taking: Reported on 08/17/2024) 90 tablet 2   Facility-Administered Medications Prior to Visit  Medication Dose Route Frequency Provider Last Rate Last Admin   Triamcinolone  Acetonide (ZILRETTA ) intra-articular injection 32 mg  32 mg Intra-articular Once         Allergies[1]     Objective:    BP 115/75   Pulse (!) 55   Temp 97.6 F (36.4 C)   Ht 5' 3.5 (1.613 m)   Wt 228 lb (103.4 kg)   SpO2 98%   BMI 39.75 kg/m    Physical Exam Vitals and nursing note reviewed.  Constitutional:      Appearance: Normal appearance. She is normal weight.  HENT:     Head: Normocephalic and atraumatic.  Neck:     Thyroid : No thyroid  mass, thyromegaly or thyroid  tenderness.  Cardiovascular:     Rate and Rhythm: Normal rate and regular rhythm.     Pulses: Normal pulses.     Heart sounds: Normal heart sounds.  Pulmonary:     Effort: Pulmonary effort is  normal.     Breath sounds: Normal breath sounds.  Lymphadenopathy:     Cervical: No cervical adenopathy.  Skin:    General: Skin is warm and dry.  Neurological:     General: No focal deficit present.     Mental Status: She is alert and oriented to person, place, and time. Mental status is at baseline.  Psychiatric:        Mood and Affect: Mood normal.        Behavior: Behavior normal.        Thought Content: Thought content normal.        Judgment: Judgment normal.       No results found for any visits on 09/08/24.     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 Awaiting CT scan results. Monitoring for genetic implications and potential metastasis. - Await CT scan results. - Follow up with gastroenterology for management.  Type 2 diabetes mellitus with stage 2 chronic kidney disease Blood sugars generally controlled with glipizide . A1c pending. - Continue glipizide  2.5 mg daily. - Monitor blood glucose regularly. - Recheck A1c in February.  Hypothyroidism Thyroid  levels elevated despite current Synthroid  dose. Concerns related to throat discomfort. Further evaluation needed. - Increase Synthroid  to 112 mcg daily. - Refer to endocrinologist. - Recheck thyroid  function tests in 8 weeks.  Gastroesophageal reflux disease with esophagitis Ongoing GERD symptoms with esophagitis. Partial relief with pantoprazole .  Recent esophageal stretching. - Continue pantoprazole  twice  daily.  Iron deficiency without anemia Low iron levels without anemia. Previous supplement caused discomfort. - Resume iron supplementation every other day.    Meds ordered this encounter  Medications   levothyroxine  (SYNTHROID ) 112 MCG tablet    Sig: Take 1 tablet (112 mcg total) by mouth daily before breakfast. TAKE 1 TABLET DAILY ON AN EMPTY STOMACH WITH ONLY WATER FOR 30 MINUTES & NO ANTACID MEDS, CALCIUM  OR MAGNESIUM FOR 4 HOURS & AVOID BIOTIN    Dispense:  90 tablet    Refill:  0    Supervising Provider:   DUANNE LOWERS T [[id number]]    Return in about 3 months (around 12/07/2024) for chronic follow-up with labs 1 week prior.  Jeoffrey GORMAN Barrio, FNP [facility]      [1]  Allergies Allergen Reactions   Levaquin [Levofloxacin In D5w] Other (See Comments)    Ankles hurt really bad ? ACHILLES TENDON ?   Doxycycline Other (See Comments)    UNSPECIFIED REACTION. Patient unsure of reaction.    Dust Mite Extract Cough   "

## 2024-09-09 ENCOUNTER — Ambulatory Visit: Payer: Self-pay | Admitting: Gastroenterology

## 2024-09-09 DIAGNOSIS — R978 Other abnormal tumor markers: Secondary | ICD-10-CM

## 2024-09-09 DIAGNOSIS — C169 Malignant neoplasm of stomach, unspecified: Secondary | ICD-10-CM

## 2024-09-14 ENCOUNTER — Other Ambulatory Visit (INDEPENDENT_AMBULATORY_CARE_PROVIDER_SITE_OTHER)

## 2024-09-14 DIAGNOSIS — C169 Malignant neoplasm of stomach, unspecified: Secondary | ICD-10-CM

## 2024-09-16 LAB — CHROMOGRANIN A: Chromogranin A (ng/mL): 2844 ng/mL — ABNORMAL HIGH (ref 0.0–101.8)

## 2024-09-16 LAB — GASTRIN: Gastrin: 534 pg/mL — ABNORMAL HIGH

## 2024-09-22 ENCOUNTER — Telehealth: Payer: Self-pay

## 2024-09-22 NOTE — Telephone Encounter (Signed)
See MyChart message for further communication.

## 2024-09-22 NOTE — Telephone Encounter (Signed)
 Left message on machine to call back

## 2024-09-22 NOTE — Telephone Encounter (Signed)
 Due to schedule change pt needs to move case to 10/20/24 at 8 am at [facility]

## 2024-09-22 NOTE — Telephone Encounter (Signed)
-----   Message from [place] sent at 09/22/2024 12:19 PM EST ----- Pet scan approved Authorization #[id number]  Tracking #[id number] Valid until 03/24

## 2024-09-24 ENCOUNTER — Other Ambulatory Visit: Payer: Self-pay | Admitting: Family Medicine

## 2024-10-07 ENCOUNTER — Encounter (HOSPITAL_COMMUNITY)

## 2024-10-20 ENCOUNTER — Encounter (HOSPITAL_COMMUNITY): Payer: Self-pay

## 2024-10-20 ENCOUNTER — Ambulatory Visit (HOSPITAL_COMMUNITY): Admit: 2024-10-20 | Admitting: Gastroenterology

## 2024-12-23 ENCOUNTER — Ambulatory Visit: Admitting: Nurse Practitioner

## 2025-04-20 ENCOUNTER — Encounter: Admitting: Family Medicine
